# Patient Record
Sex: Female | Born: 1935 | Race: White | Hispanic: No | Smoking: Never smoker
Health system: Southern US, Community
[De-identification: ages and names within clinical notes are randomized; demographics above are authoritative.]

## PROBLEM LIST (undated history)

## (undated) DIAGNOSIS — E119 Type 2 diabetes mellitus without complications: Secondary | ICD-10-CM

## (undated) DIAGNOSIS — I2510 Atherosclerotic heart disease of native coronary artery without angina pectoris: Secondary | ICD-10-CM

## (undated) DIAGNOSIS — Z8781 Personal history of (healed) traumatic fracture: Secondary | ICD-10-CM

## (undated) DIAGNOSIS — N3010 Interstitial cystitis (chronic) without hematuria: Secondary | ICD-10-CM

## (undated) DIAGNOSIS — J449 Chronic obstructive pulmonary disease, unspecified: Secondary | ICD-10-CM

## (undated) DIAGNOSIS — M353 Polymyalgia rheumatica: Secondary | ICD-10-CM

## (undated) DIAGNOSIS — E785 Hyperlipidemia, unspecified: Secondary | ICD-10-CM

## (undated) DIAGNOSIS — R413 Other amnesia: Secondary | ICD-10-CM

## (undated) DIAGNOSIS — R5383 Other fatigue: Secondary | ICD-10-CM

## (undated) DIAGNOSIS — I10 Essential (primary) hypertension: Secondary | ICD-10-CM

## (undated) DIAGNOSIS — M1990 Unspecified osteoarthritis, unspecified site: Secondary | ICD-10-CM

## (undated) DIAGNOSIS — N390 Urinary tract infection, site not specified: Secondary | ICD-10-CM

## (undated) DIAGNOSIS — K8020 Calculus of gallbladder without cholecystitis without obstruction: Secondary | ICD-10-CM

## (undated) DIAGNOSIS — R5381 Other malaise: Secondary | ICD-10-CM

## (undated) DIAGNOSIS — E039 Hypothyroidism, unspecified: Secondary | ICD-10-CM

## (undated) HISTORY — PX: TOTAL ABDOMINAL HYSTERECTOMY: SHX209

## (undated) HISTORY — PX: TONSILLECTOMY: SUR1361

## (undated) HISTORY — DX: Hyperlipidemia, unspecified: E78.5

## (undated) HISTORY — PX: HERNIA REPAIR: SHX51

## (undated) HISTORY — DX: Chronic obstructive pulmonary disease, unspecified: J44.9

## (undated) HISTORY — DX: Other amnesia: R41.3

## (undated) HISTORY — PX: CERVICAL SPINE SURGERY: SHX589

## (undated) HISTORY — DX: Unspecified osteoarthritis, unspecified site: M19.90

## (undated) HISTORY — DX: Other fatigue: R53.83

## (undated) HISTORY — DX: Atherosclerotic heart disease of native coronary artery without angina pectoris: I25.10

## (undated) HISTORY — DX: Interstitial cystitis (chronic) without hematuria: N30.10

## (undated) HISTORY — DX: Calculus of gallbladder without cholecystitis without obstruction: K80.20

## (undated) HISTORY — PX: LUMBAR DISC SURGERY: SHX700

## (undated) HISTORY — PX: ROTATOR CUFF REPAIR: SHX139

## (undated) HISTORY — DX: Urinary tract infection, site not specified: N39.0

## (undated) HISTORY — DX: Type 2 diabetes mellitus without complications: E11.9

## (undated) HISTORY — DX: Polymyalgia rheumatica: M35.3

## (undated) HISTORY — DX: Other malaise: R53.81

## (undated) HISTORY — DX: Essential (primary) hypertension: I10

## (undated) HISTORY — DX: Personal history of (healed) traumatic fracture: Z87.81

## (undated) HISTORY — PX: CHOLECYSTECTOMY: SHX55

---

## 1998-11-06 ENCOUNTER — Other Ambulatory Visit: Admission: RE | Admit: 1998-11-06 | Discharge: 1998-11-06 | Payer: Self-pay | Admitting: *Deleted

## 1999-02-26 ENCOUNTER — Ambulatory Visit (HOSPITAL_COMMUNITY): Admission: RE | Admit: 1999-02-26 | Discharge: 1999-02-26 | Payer: Self-pay

## 1999-05-07 ENCOUNTER — Encounter: Payer: Self-pay | Admitting: Cardiology

## 1999-05-07 ENCOUNTER — Ambulatory Visit (HOSPITAL_COMMUNITY): Admission: RE | Admit: 1999-05-07 | Discharge: 1999-05-07 | Payer: Self-pay | Admitting: Cardiology

## 2000-02-03 ENCOUNTER — Other Ambulatory Visit: Admission: RE | Admit: 2000-02-03 | Discharge: 2000-02-03 | Payer: Self-pay | Admitting: *Deleted

## 2000-02-11 ENCOUNTER — Encounter: Admission: RE | Admit: 2000-02-11 | Discharge: 2000-02-11 | Payer: Self-pay | Admitting: Orthopedic Surgery

## 2000-02-11 ENCOUNTER — Encounter: Payer: Self-pay | Admitting: Orthopedic Surgery

## 2000-02-20 ENCOUNTER — Inpatient Hospital Stay (HOSPITAL_COMMUNITY): Admission: RE | Admit: 2000-02-20 | Discharge: 2000-02-21 | Payer: Self-pay | Admitting: Orthopedic Surgery

## 2000-07-06 ENCOUNTER — Ambulatory Visit (HOSPITAL_COMMUNITY): Admission: RE | Admit: 2000-07-06 | Discharge: 2000-07-06 | Payer: Self-pay | Admitting: Cardiology

## 2000-07-06 ENCOUNTER — Encounter: Payer: Self-pay | Admitting: Cardiology

## 2000-09-14 ENCOUNTER — Ambulatory Visit (HOSPITAL_BASED_OUTPATIENT_CLINIC_OR_DEPARTMENT_OTHER): Admission: RE | Admit: 2000-09-14 | Discharge: 2000-09-14 | Payer: Self-pay | Admitting: *Deleted

## 2000-09-14 ENCOUNTER — Encounter (INDEPENDENT_AMBULATORY_CARE_PROVIDER_SITE_OTHER): Payer: Self-pay | Admitting: Specialist

## 2001-03-02 ENCOUNTER — Other Ambulatory Visit: Admission: RE | Admit: 2001-03-02 | Discharge: 2001-03-02 | Payer: Self-pay | Admitting: *Deleted

## 2001-03-15 ENCOUNTER — Ambulatory Visit (HOSPITAL_COMMUNITY): Admission: RE | Admit: 2001-03-15 | Discharge: 2001-03-15 | Payer: Self-pay | Admitting: *Deleted

## 2001-03-15 HISTORY — PX: CARDIAC CATHETERIZATION: SHX172

## 2001-04-07 ENCOUNTER — Ambulatory Visit (HOSPITAL_COMMUNITY): Admission: RE | Admit: 2001-04-07 | Discharge: 2001-04-07 | Payer: Self-pay | Admitting: *Deleted

## 2001-11-18 ENCOUNTER — Ambulatory Visit (HOSPITAL_COMMUNITY): Admission: RE | Admit: 2001-11-18 | Discharge: 2001-11-18 | Payer: Self-pay | Admitting: Internal Medicine

## 2002-03-01 ENCOUNTER — Encounter: Admission: RE | Admit: 2002-03-01 | Discharge: 2002-03-01 | Payer: Self-pay | Admitting: Orthopedic Surgery

## 2002-03-01 ENCOUNTER — Encounter: Payer: Self-pay | Admitting: Orthopedic Surgery

## 2002-09-12 ENCOUNTER — Ambulatory Visit (HOSPITAL_COMMUNITY): Admission: RE | Admit: 2002-09-12 | Discharge: 2002-09-12 | Payer: Self-pay | Admitting: Vascular Surgery

## 2002-09-12 ENCOUNTER — Encounter (INDEPENDENT_AMBULATORY_CARE_PROVIDER_SITE_OTHER): Payer: Self-pay | Admitting: *Deleted

## 2002-09-16 ENCOUNTER — Encounter: Admission: RE | Admit: 2002-09-16 | Discharge: 2002-09-16 | Payer: Self-pay | Admitting: Rheumatology

## 2002-09-16 ENCOUNTER — Encounter: Payer: Self-pay | Admitting: Rheumatology

## 2002-10-04 ENCOUNTER — Encounter: Payer: Self-pay | Admitting: Rheumatology

## 2002-10-04 ENCOUNTER — Encounter: Admission: RE | Admit: 2002-10-04 | Discharge: 2002-10-04 | Payer: Self-pay | Admitting: Rheumatology

## 2005-02-28 ENCOUNTER — Ambulatory Visit: Payer: Self-pay | Admitting: Internal Medicine

## 2005-07-01 ENCOUNTER — Ambulatory Visit: Payer: Self-pay | Admitting: Internal Medicine

## 2005-09-24 ENCOUNTER — Ambulatory Visit: Payer: Self-pay | Admitting: Internal Medicine

## 2006-01-01 ENCOUNTER — Ambulatory Visit: Payer: Self-pay | Admitting: Internal Medicine

## 2006-01-29 ENCOUNTER — Ambulatory Visit (HOSPITAL_COMMUNITY): Admission: EM | Admit: 2006-01-29 | Discharge: 2006-01-29 | Payer: Self-pay | Admitting: Emergency Medicine

## 2006-01-29 HISTORY — PX: CARDIAC CATHETERIZATION: SHX172

## 2006-06-30 ENCOUNTER — Ambulatory Visit: Payer: Self-pay | Admitting: Internal Medicine

## 2007-01-01 ENCOUNTER — Ambulatory Visit: Payer: Self-pay | Admitting: Internal Medicine

## 2007-07-02 ENCOUNTER — Ambulatory Visit: Payer: Self-pay | Admitting: Internal Medicine

## 2007-09-14 HISTORY — PX: US ECHOCARDIOGRAPHY: HXRAD669

## 2007-10-25 ENCOUNTER — Encounter: Admission: RE | Admit: 2007-10-25 | Discharge: 2007-11-10 | Payer: Self-pay | Admitting: Otolaryngology

## 2007-11-08 ENCOUNTER — Emergency Department (HOSPITAL_COMMUNITY): Admission: EM | Admit: 2007-11-08 | Discharge: 2007-11-08 | Payer: Self-pay | Admitting: Emergency Medicine

## 2007-11-25 ENCOUNTER — Encounter: Admission: RE | Admit: 2007-11-25 | Discharge: 2007-11-26 | Payer: Self-pay | Admitting: Otolaryngology

## 2007-12-15 ENCOUNTER — Ambulatory Visit: Payer: Self-pay | Admitting: Internal Medicine

## 2007-12-17 DIAGNOSIS — R0602 Shortness of breath: Secondary | ICD-10-CM | POA: Insufficient documentation

## 2008-01-06 DIAGNOSIS — J449 Chronic obstructive pulmonary disease, unspecified: Secondary | ICD-10-CM

## 2008-01-06 DIAGNOSIS — E119 Type 2 diabetes mellitus without complications: Secondary | ICD-10-CM | POA: Insufficient documentation

## 2008-01-06 DIAGNOSIS — M353 Polymyalgia rheumatica: Secondary | ICD-10-CM

## 2008-01-06 DIAGNOSIS — J4489 Other specified chronic obstructive pulmonary disease: Secondary | ICD-10-CM | POA: Insufficient documentation

## 2008-01-07 ENCOUNTER — Ambulatory Visit: Payer: Self-pay | Admitting: Internal Medicine

## 2008-01-08 DIAGNOSIS — E785 Hyperlipidemia, unspecified: Secondary | ICD-10-CM | POA: Insufficient documentation

## 2008-01-08 DIAGNOSIS — J45909 Unspecified asthma, uncomplicated: Secondary | ICD-10-CM | POA: Insufficient documentation

## 2008-07-06 ENCOUNTER — Ambulatory Visit: Payer: Self-pay | Admitting: Internal Medicine

## 2008-08-28 ENCOUNTER — Telehealth (INDEPENDENT_AMBULATORY_CARE_PROVIDER_SITE_OTHER): Payer: Self-pay | Admitting: *Deleted

## 2008-10-31 ENCOUNTER — Encounter: Admission: RE | Admit: 2008-10-31 | Discharge: 2008-10-31 | Payer: Self-pay | Admitting: General Surgery

## 2008-12-26 ENCOUNTER — Encounter: Admission: RE | Admit: 2008-12-26 | Discharge: 2008-12-26 | Payer: Self-pay | Admitting: Rheumatology

## 2008-12-27 ENCOUNTER — Other Ambulatory Visit: Payer: Self-pay | Admitting: Emergency Medicine

## 2008-12-28 ENCOUNTER — Inpatient Hospital Stay (HOSPITAL_COMMUNITY): Admission: EM | Admit: 2008-12-28 | Discharge: 2008-12-30 | Payer: Self-pay | Admitting: Neurosurgery

## 2009-02-18 ENCOUNTER — Encounter: Admission: RE | Admit: 2009-02-18 | Discharge: 2009-02-18 | Payer: Self-pay | Admitting: Neurosurgery

## 2009-02-21 ENCOUNTER — Ambulatory Visit: Payer: Self-pay | Admitting: Internal Medicine

## 2009-08-17 HISTORY — PX: NM MYOCAR PERF WALL MOTION: HXRAD629

## 2009-08-22 ENCOUNTER — Encounter: Payer: Self-pay | Admitting: Orthopedic Surgery

## 2009-08-23 ENCOUNTER — Encounter: Admission: RE | Admit: 2009-08-23 | Discharge: 2009-08-23 | Payer: Self-pay | Admitting: Orthopedic Surgery

## 2009-08-28 ENCOUNTER — Ambulatory Visit: Payer: Self-pay | Admitting: Vascular Surgery

## 2009-08-28 ENCOUNTER — Encounter (INDEPENDENT_AMBULATORY_CARE_PROVIDER_SITE_OTHER): Payer: Self-pay | Admitting: Neurosurgery

## 2009-08-29 ENCOUNTER — Inpatient Hospital Stay (HOSPITAL_COMMUNITY): Admission: RE | Admit: 2009-08-29 | Discharge: 2009-08-30 | Payer: Self-pay | Admitting: Neurosurgery

## 2009-09-27 ENCOUNTER — Encounter: Admission: RE | Admit: 2009-09-27 | Discharge: 2009-09-27 | Payer: Self-pay | Admitting: Neurosurgery

## 2009-10-24 ENCOUNTER — Emergency Department (HOSPITAL_COMMUNITY): Admission: EM | Admit: 2009-10-24 | Discharge: 2009-10-24 | Payer: Self-pay | Admitting: Emergency Medicine

## 2009-11-01 ENCOUNTER — Encounter: Admission: RE | Admit: 2009-11-01 | Discharge: 2009-11-01 | Payer: Self-pay | Admitting: General Surgery

## 2009-11-16 ENCOUNTER — Encounter (HOSPITAL_BASED_OUTPATIENT_CLINIC_OR_DEPARTMENT_OTHER): Admission: RE | Admit: 2009-11-16 | Discharge: 2010-02-14 | Payer: Self-pay | Admitting: Internal Medicine

## 2009-11-23 ENCOUNTER — Encounter: Admission: RE | Admit: 2009-11-23 | Discharge: 2009-11-23 | Payer: Self-pay | Admitting: Neurosurgery

## 2009-12-21 ENCOUNTER — Encounter (HOSPITAL_BASED_OUTPATIENT_CLINIC_OR_DEPARTMENT_OTHER): Payer: Self-pay | Admitting: General Surgery

## 2009-12-21 ENCOUNTER — Ambulatory Visit: Payer: Self-pay | Admitting: Vascular Surgery

## 2009-12-21 ENCOUNTER — Ambulatory Visit: Admission: RE | Admit: 2009-12-21 | Discharge: 2009-12-21 | Payer: Self-pay | Admitting: General Surgery

## 2010-01-09 ENCOUNTER — Encounter: Admission: RE | Admit: 2010-01-09 | Discharge: 2010-01-09 | Payer: Self-pay | Admitting: Neurosurgery

## 2010-02-21 ENCOUNTER — Ambulatory Visit: Payer: Self-pay | Admitting: Internal Medicine

## 2010-03-12 ENCOUNTER — Encounter: Admission: RE | Admit: 2010-03-12 | Discharge: 2010-03-12 | Payer: Self-pay | Admitting: Neurosurgery

## 2010-03-25 ENCOUNTER — Inpatient Hospital Stay (HOSPITAL_COMMUNITY): Admission: RE | Admit: 2010-03-25 | Discharge: 2010-03-27 | Payer: Self-pay | Admitting: Orthopedic Surgery

## 2010-05-15 ENCOUNTER — Encounter: Payer: Self-pay | Admitting: Internal Medicine

## 2010-08-13 ENCOUNTER — Encounter: Payer: Self-pay | Admitting: Internal Medicine

## 2010-11-06 ENCOUNTER — Encounter
Admission: RE | Admit: 2010-11-06 | Discharge: 2010-11-06 | Payer: Self-pay | Source: Home / Self Care | Attending: General Surgery | Admitting: General Surgery

## 2010-11-12 ENCOUNTER — Encounter: Payer: Self-pay | Admitting: Internal Medicine

## 2010-12-12 NOTE — Letter (Signed)
Summary: [facility]   Imported By: Sherian Rein 08/21/2010 09:55:15  _____________________________________________________________________  External Attachment:    Type:   Image     Comment:   External Document

## 2010-12-12 NOTE — Letter (Signed)
Summary: [facility]   Imported By: Lester [place] 05/30/2010 09:50:31  _____________________________________________________________________  External Attachment:    Type:   Image     Comment:   External Document

## 2010-12-12 NOTE — Letter (Signed)
Summary: [facility]   Imported By: Lennie Odor 12/05/2010 09:16:08  _____________________________________________________________________  External Attachment:    Type:   Image     Comment:   External Document

## 2010-12-12 NOTE — Assessment & Plan Note (Signed)
Summary: 1 YEAR RETURN/[facility]   Primary Provider/Referring Provider:  Loma Sender  CC:  Yearly follow up visit-Still having SOB with activity..  History of Present Illness:  07/03/08 77 year old woman returning for follow-up of asthma/ copd. She remains on prednisone 7.5 mg daily, which ikely willhelp her breathing. She denies wheeze, phlegm, chest pain , discolored sputum, fever, ankle edema. Using Advair 100/50 once daily duet o finances. Last PFT was in February.  02/21/09- COPD, asthma Not outside much, limited by backpain after lumbar surgery this winter. Dr Stoney Bang through winter ok but starting yesterday evening got rhinorhea, minimal sore throat, eyes water, sneeze. Not in chest and no obvious fever or purulent. She thinks it is a cold, not allergy but has had no sick exposure.  February 21, 2010- COPD, asthma Activity limited by arthritis in knees- going for surgery Dr Charlann Boxer next month. Had one remote pneumonia vaccine. She has long second-hand smoke hx and heats with wood. Chronic pain behind right breast if she forces deep  breath- not new or progressive. No longer keeps much cough or phlegm. Needs refill Advair. ENT recently gave azithromycin for vertigo. Saw Dr Lynnea Ferrier this winter for tachycardia. PFT 2009- Mild obstruction small airways. FEV1 1.55 L/ 88%, little response to BD.  Current Medications (verified): 1)  Micardis Hct 40-12.5 Mg  Tabs (Telmisartan-Hctz) .... Take 1 Tablet By Mouth Once A Day 2)  Synthroid 88 Mcg Tabs (Levothyroxine Sodium) .... Take 1 By Mouth Once Daily 3)  Advair Diskus 100-50 Mcg/dose  Misc (Fluticasone-Salmeterol) .... Use One Puff Two Times A Day Rinse Mouth After Each Use 4)  Multivitamins   Tabs (Multiple Vitamin) .... Take 1 Tablet By Mouth Once A Day 5)  Prednisone 5 Mg  Tabs (Prednisone) .... Take 1 Tablet By Mouth Once A Day 6)  Hydrocodone-Acetaminophen 5-500 Mg Tabs (Hydrocodone-Acetaminophen) .... Take 1 By Mouth Every 6 Hours As  Needed 7)  Humulin N 100 Unit/ml Susp (Insulin Isophane Human) .... As Directed 8)  Humalog 100 Unit/ml Soln (Insulin Lispro (Human)) .... As Directed  Allergies (verified): No Known Drug Allergies  Past History:  Past Medical History: Last updated: 01/07/2008 Diabetes, Type 2 Hyperlipidemia Asthma/ Mild COPD Polymyalgia rheumatica- Dr. Phylliss Bob  Family History: Last updated: 02/21/2010 Mother doed age 21- breast cancer Father died 34- heart disease brother- oral cancer nonsmoker brother- died lung cancer  Social History: Last updated: 02/21/2010 Patient never smoked. - was married to smoker widowed  Risk Factors: Smoking Status: never (01/07/2008)  Past Surgical History: lumbar disk surgery Right rotator cuff Cervical spine- Oct, 2010 Total Abdominal Hysterectomy Breast lumps- benign Tonsillectomy  Family History: Mother doed age 33- breast cancer Father died 70- heart disease brother- oral cancer nonsmoker brother- died lung cancer  Social History: Patient never smoked. - was married to smoker widowed  Review of Systems      See HPI  The patient denies anorexia, fever, weight loss, weight gain, vision loss, decreased hearing, hoarseness, chest pain, syncope, dyspnea on exertion, peripheral edema, prolonged cough, headaches, hemoptysis, and severe indigestion/heartburn.    Vital Signs:  Patient profile:   77 year old female Height:      62 inches Weight:      150.25 pounds BMI:     27.58 O2 Sat:      100 % on Room air Pulse rate:   73 / minute BP sitting:   130 / 78  (left arm) Cuff size:   regular  Vitals Entered By: Reynaldo Minium  CMA (February 21, 2010 1:57 PM)  O2 Flow:  Room air  Physical Exam  Additional Exam:  General: A/Ox3; pleasant and cooperative, NAD, SKIN: no rash, lesions NODES: no lymphadenopathy HEENT: [place]/AT, EOM- WNL, Conjuctivae- clear, PERRLA, TM-WNL, Nose-clear, Throat- clear and wnl NECK: Supple w/ fair ROM, JVD- none, normal  carotid impulses w/o bruits Thyroid- normal to palpation CHEST: Clear to P&A, no cough or wheeze HEART: RRR, no m/g/r heard ABDOMEN:  WUX:LKGM, nl pulses, no edema, cyanosis or clubbing  NEURO: Grossly intact to observation      Impression & Recommendations:  Problem # 1:  COPD (ICD-496) Mild COPD with 2009 PFT FEV1  1.55L/ 88%, FEV1/FVC 0.70, little response to BD. Much of this was from past second hand smoke. She is at baseline and maintained comfortably with Advair. She is at stable baseline now and should tolerate surgery/ GOT. We can see for pulmonary assistance if needed at time of surgery. We will update pneumovax and refill Advair.   Medications Added to Medication List This Visit: 1)  Synthroid 88 Mcg Tabs (Levothyroxine sodium) .... Take 1 by mouth once daily 2)  Hydrocodone-acetaminophen 5-500 Mg Tabs (Hydrocodone-acetaminophen) .... Take 1 by mouth every 6 hours as needed 3)  Humulin N 100 Unit/ml Susp (Insulin isophane human) .... As directed 4)  Humalog 100 Unit/ml Soln (Insulin lispro (human)) .... As directed  Other Orders: Est. Patient Level III (01027) Prescription Created Electronically [phone])  Patient Instructions: 1)  Please schedule a follow-up appointment in 1 year. 2)  Pneumonia vaccine booster 3)  Refill script sent to your drug store. Prescriptions: ADVAIR DISKUS 100-50 MCG/DOSE  MISC (FLUTICASONE-SALMETEROL) use one puff two times a day rinse mouth after each use  #1 x prn   Entered and Authorized by:   Waymon Budge MD   Signed by:   Waymon Budge MD on 02/21/2010   Method used:   Electronically to        [facility] (retail)       [address]       [place], [place]  [id number]       Ph: [phone]       Fax: [phone]   RxID:   [id number]     Appended Document: pneumovax documentation    Clinical Lists Changes  Orders: Added new Service order of Pneumococcal Vaccine (73220) - Signed Added new  Service order of Admin 1st Vaccine (25427) - Signed Observations: Added new observation of PNEUMOVAXVIS: 06/07/96 version given February 21, 2010. (02/21/2010 16:59) Added new observation of PNEUMOVAXLOT: 0130aa (02/21/2010 16:59) Added new observation of PNEUMOVAXEXP: 06/22/2011 (02/21/2010 16:59) Added new observation of PNEUMOVAXBY: Boone Master CNA (02/21/2010 16:59) Added new observation of PNEUMOVAXRTE: IM (02/21/2010 16:59) Added new observation of PNEUMOVAXDOS: 0.5 ml (02/21/2010 16:59) Added new observation of PNEUMOVAXMFR: Merck (02/21/2010 16:59) Added new observation of PNEUMOVAXSIT: left deltoid (02/21/2010 16:59) Added new observation of PNEUMOVAX: Pneumovax (02/21/2010 16:59)       Immunizations Administered:  Pneumonia Vaccine:    Vaccine Type: Pneumovax    Site: left deltoid    Mfr: Merck    Dose: 0.5 ml    Route: IM    Given by: Boone Master CNA    Exp. Date: 06/22/2011    Lot #: [id number]    VIS given: 06/07/96 version given February 21, 2010.

## 2011-01-27 LAB — COMPREHENSIVE METABOLIC PANEL
ALT: 23 U/L (ref 0–35)
AST: 43 U/L — ABNORMAL HIGH (ref 0–37)
Alkaline Phosphatase: 77 U/L (ref 39–117)
Creatinine, Ser: 1.14 mg/dL (ref 0.4–1.2)
GFR calc Af Amer: 56 mL/min — ABNORMAL LOW (ref >60)
GFR calc non Af Amer: 46 mL/min — ABNORMAL LOW (ref >60)
Potassium: 4.6 mEq/L (ref 3.5–5.1)
Sodium: 142 mEq/L (ref 135–145)

## 2011-01-27 LAB — CBC
HCT: 26.6 % — ABNORMAL LOW (ref 36.0–46.0)
Hemoglobin: 9 g/dL — ABNORMAL LOW (ref 12.0–15.0)
MCV: 93.7 fL (ref 78.0–100.0)
MCV: 94.7 fL (ref 78.0–100.0)
Platelets: 180 10*3/uL (ref 150–400)
Platelets: 205 10*3/uL (ref 150–400)
RBC: 2.91 MIL/uL — ABNORMAL LOW (ref 3.87–5.11)
RDW: 14.5 % (ref 11.5–15.5)
WBC: 8 10*3/uL (ref 4.0–10.5)
WBC: 8 10*3/uL (ref 4.0–10.5)

## 2011-01-27 LAB — GLUCOSE, CAPILLARY
Glucose-Capillary: 104 mg/dL — ABNORMAL HIGH (ref 70–99)
Glucose-Capillary: 116 mg/dL — ABNORMAL HIGH (ref 70–99)
Glucose-Capillary: 136 mg/dL — ABNORMAL HIGH (ref 70–99)
Glucose-Capillary: 164 mg/dL — ABNORMAL HIGH (ref 70–99)
Glucose-Capillary: 228 mg/dL — ABNORMAL HIGH (ref 70–99)
Glucose-Capillary: 312 mg/dL — ABNORMAL HIGH (ref 70–99)
Glucose-Capillary: 60 mg/dL — ABNORMAL LOW (ref 70–99)
Glucose-Capillary: 69 mg/dL — ABNORMAL LOW (ref 70–99)
Glucose-Capillary: 74 mg/dL (ref 70–99)

## 2011-01-27 LAB — BASIC METABOLIC PANEL
BUN: 23 mg/dL (ref 6–23)
Chloride: 104 mEq/L (ref 96–112)
Chloride: 109 mEq/L (ref 96–112)
Creatinine, Ser: 1 mg/dL (ref 0.4–1.2)
GFR calc Af Amer: >60 mL/min (ref >60)
GFR calc non Af Amer: 54 mL/min — ABNORMAL LOW (ref >60)
Glucose, Bld: 315 mg/dL — ABNORMAL HIGH (ref 70–99)
Potassium: 5.5 mEq/L — ABNORMAL HIGH (ref 3.5–5.1)
Sodium: 135 mEq/L (ref 135–145)

## 2011-01-27 LAB — TYPE AND SCREEN
ABO/RH(D): O POS
Antibody Screen: NEGATIVE

## 2011-01-28 LAB — CBC
HCT: 36.3 % (ref 36.0–46.0)
Hemoglobin: 12.2 g/dL (ref 12.0–15.0)
Platelets: 270 10*3/uL (ref 150–400)
RDW: 13.9 % (ref 11.5–15.5)
WBC: 6.4 10*3/uL (ref 4.0–10.5)

## 2011-01-28 LAB — BASIC METABOLIC PANEL
Calcium: 9.7 mg/dL (ref 8.4–10.5)
GFR calc non Af Amer: 55 mL/min — ABNORMAL LOW (ref >60)
Glucose, Bld: 54 mg/dL — ABNORMAL LOW (ref 70–99)
Potassium: 4.1 mEq/L (ref 3.5–5.1)
Sodium: 141 mEq/L (ref 135–145)

## 2011-01-28 LAB — URINE MICROSCOPIC-ADD ON

## 2011-01-28 LAB — URINALYSIS, ROUTINE W REFLEX MICROSCOPIC
Bilirubin Urine: NEGATIVE
Glucose, UA: 100 mg/dL — AB
Hgb urine dipstick: NEGATIVE
Ketones, ur: NEGATIVE mg/dL
Specific Gravity, Urine: 1.019 (ref 1.005–1.030)
pH: 5.5 (ref 5.0–8.0)

## 2011-01-28 LAB — DIFFERENTIAL
Basophils Absolute: 0 10*3/uL (ref 0.0–0.1)
Eosinophils Relative: 0 % (ref 0–5)
Lymphocytes Relative: 34 % (ref 12–46)
Lymphs Abs: 2.2 10*3/uL (ref 0.7–4.0)
Monocytes Absolute: 0.6 10*3/uL (ref 0.1–1.0)
Neutro Abs: 3.7 10*3/uL (ref 1.7–7.7)

## 2011-01-28 LAB — APTT: aPTT: 27 seconds (ref 24–37)

## 2011-01-28 LAB — PROTIME-INR: Prothrombin Time: 13.6 seconds (ref 11.6–15.2)

## 2011-02-13 LAB — BASIC METABOLIC PANEL
BUN: 18 mg/dL (ref 6–23)
Calcium: 9.3 mg/dL (ref 8.4–10.5)
Chloride: 101 mEq/L (ref 96–112)
Creatinine, Ser: 0.9 mg/dL (ref 0.4–1.2)
GFR calc Af Amer: >60 mL/min (ref >60)
GFR calc Af Amer: >60 mL/min (ref >60)
GFR calc non Af Amer: 54 mL/min — ABNORMAL LOW (ref >60)
Potassium: 3.9 mEq/L (ref 3.5–5.1)
Sodium: 140 mEq/L (ref 135–145)

## 2011-02-13 LAB — GLUCOSE, CAPILLARY
Glucose-Capillary: 123 mg/dL — ABNORMAL HIGH (ref 70–99)
Glucose-Capillary: 303 mg/dL — ABNORMAL HIGH (ref 70–99)
Glucose-Capillary: 548 mg/dL (ref 70–99)

## 2011-02-13 LAB — CBC
HCT: 35.6 % — ABNORMAL LOW (ref 36.0–46.0)
Hemoglobin: 12.7 g/dL (ref 12.0–15.0)
MCV: 91 fL (ref 78.0–100.0)
Platelets: 232 10*3/uL (ref 150–400)
RBC: 3.94 MIL/uL (ref 3.87–5.11)
RBC: 3.91 MIL/uL (ref 3.87–5.11)
WBC: 6.6 10*3/uL (ref 4.0–10.5)
WBC: 5.9 10*3/uL (ref 4.0–10.5)

## 2011-02-13 LAB — PROTIME-INR
INR: 1.02 (ref 0.00–1.49)
Prothrombin Time: 13.3 seconds (ref 11.6–15.2)

## 2011-02-13 LAB — DIFFERENTIAL
Eosinophils Relative: 0 % (ref 0–5)
Lymphocytes Relative: 31 % (ref 12–46)
Lymphs Abs: 1.8 10*3/uL (ref 0.7–4.0)
Monocytes Relative: 9 % (ref 3–12)
Neutrophils Relative %: 60 % (ref 43–77)

## 2011-02-13 LAB — URINALYSIS, ROUTINE W REFLEX MICROSCOPIC
Protein, ur: NEGATIVE mg/dL
Specific Gravity, Urine: 1.027 (ref 1.005–1.030)
Urobilinogen, UA: 0.2 mg/dL (ref 0.0–1.0)

## 2011-02-13 LAB — URINE MICROSCOPIC-ADD ON

## 2011-02-18 ENCOUNTER — Encounter: Payer: Self-pay | Admitting: Internal Medicine

## 2011-02-20 ENCOUNTER — Encounter: Payer: Self-pay | Admitting: Internal Medicine

## 2011-02-20 ENCOUNTER — Ambulatory Visit (INDEPENDENT_AMBULATORY_CARE_PROVIDER_SITE_OTHER): Payer: Medicare Other | Admitting: Internal Medicine

## 2011-02-20 VITALS — BP 144/70 | HR 74 | Ht 62.0 in | Wt 171.0 lb

## 2011-02-20 DIAGNOSIS — J449 Chronic obstructive pulmonary disease, unspecified: Secondary | ICD-10-CM

## 2011-02-20 MED ORDER — TIOTROPIUM BROMIDE MONOHYDRATE 18 MCG IN CAPS: 18.0000 ug | ORAL_CAPSULE | Freq: Every day | RESPIRATORY_TRACT | Status: DC

## 2011-02-20 MED ORDER — ALBUTEROL SULFATE HFA 108 (90 BASE) MCG/ACT IN AERS: 2.0000 | INHALATION_SPRAY | Freq: Four times a day (QID) | RESPIRATORY_TRACT | Status: DC | PRN

## 2011-02-20 NOTE — Patient Instructions (Signed)
Sample Spiriva- one daily.   See [facility] about financial assistance for Spiriva before filling the scrip.  You can continue Advair if it helps.  Script for rescue inhaler Proair to use as a "quick patch"-   2 puffs up to 4 times daily if needed.

## 2011-02-20 NOTE — Progress Notes (Signed)
  Subjective:    Patient ID: Michelle [name], female    DOB: 15-Nov-1931, 77 y.o.   MRN: [id number]  HPI 89 yoF never smoker, followed for COPD/ asthma, complicated by Polymyalgia Rheumatica. Last here February 21, 2010. Husband was a long time smoker- so she had significant second hand exposure and has continued also to heat with wood . She ran out of Advair using once daily, when she reached donut hole status last November. Didn't use it at all November and December. Now back at once daily. Notices some modest benefit, but still can't afford the Advair and asks alternatives. Has not had a rescue inhaler. Wheeze most days, sometimes worse than others. Cough scant sputum most mornings.  Blows nose. Denies chest pain. Has had occasional palpitations. Sees Dr Lynnea Ferrier for cardiology. Tolerated right knee replacement last year.    Review of Systems Constitutional:   No weight loss, night sweats,  Fevers, chills, fatigue, lassitude. HEENT:   No headaches,  Difficulty swallowing,  Tooth/dental problems,  Sore throat,                No sneezing, itching, ear ache, nasal congestion, post nasal drip,   CV:  No chest pain,  Orthopnea, PND, swelling in lower extremities, anasarca, dizziness, palpitations  GI  No heartburn, indigestion, abdominal pain, nausea, vomiting, diarrhea, change in bowel habits, loss of appetite  Resp: ,  No coughing up of blood.  No change in color of mucus.  No wheezing.  No chest wall deformity  Skin: no rash or lesions.  GU: no dysuria, change in color of urine, no urgency or frequency.  No flank pain.  MS:  No joint pain or swelling.  No decreased range of motion.  No back pain.  Psych:  No change in mood or affect. No depression or anxiety.  No memory loss.     Objective:   Physical Exam General- Alert, Oriented, Affect-appropriate, Distress- none acute.    talkative  Skin- rash-none, lesions- none, excoriation- none  Lymphadenopathy- none  Head- atraumatic  Eyes-  Gross vision intact, PERRLA, conjunctivae clear secretions  Ears- Hearing normal canals, Tm L , R ,  Nose- Clear,  No-Septal dev, mucus, polyps, erosion, perforation   Throat- Mallampati II , mucosa clear , drainage- none, tonsils- atrophic  Neck- flexible , trachea midline, no stridor , thyroid nl, carotid no bruit  Chest - symmetrical excursion , unlabored     Heart/CV- RRR , no murmur , no gallop  , no rub, nl s1 s2                     - JVD- none , edema- none, stasis changes- none, varices- none     Lung- clear to P&A, wheeze- none, cough- none , dullness-none, rub- none     Chest wall- atraumatic, no scar  Abd- tender-no, distended-no, bowel sounds-present, HSM- no  Br/ Gen/ Rectal- Not done, not indicated  Extrem- cyanosis- none, clubbing, none, atrophy- none, strength- nl  Neuro- grossly intact to observation         Assessment & Plan:

## 2011-02-20 NOTE — Assessment & Plan Note (Addendum)
Not sure, and it may be moot, whether her obstructive airways disease was more from chronic fixed asthma vs result of her husband's smoking.  She can't afford Advair. She asks me about Spiriva, from TV ads. We discussed use of a rescue inhaler and Spiriva with financial assistance.

## 2011-02-24 ENCOUNTER — Encounter: Payer: Self-pay | Admitting: Internal Medicine

## 2011-02-25 LAB — BASIC METABOLIC PANEL
BUN: 27 mg/dL — ABNORMAL HIGH (ref 6–23)
CO2: 26 mEq/L (ref 19–32)
CO2: 27 mEq/L (ref 19–32)
Calcium: 8.6 mg/dL (ref 8.4–10.5)
Calcium: 8.7 mg/dL (ref 8.4–10.5)
Chloride: 102 mEq/L (ref 96–112)
Creatinine, Ser: 1.21 mg/dL — ABNORMAL HIGH (ref 0.4–1.2)
GFR calc Af Amer: >60 mL/min (ref >60)
GFR calc non Af Amer: 44 mL/min — ABNORMAL LOW (ref >60)
GFR calc non Af Amer: 51 mL/min — ABNORMAL LOW (ref >60)
Glucose, Bld: 174 mg/dL — ABNORMAL HIGH (ref 70–99)
Glucose, Bld: 209 mg/dL — ABNORMAL HIGH (ref 70–99)
Glucose, Bld: 261 mg/dL — ABNORMAL HIGH (ref 70–99)
Potassium: 5.1 mEq/L (ref 3.5–5.1)
Sodium: 137 mEq/L (ref 135–145)

## 2011-02-25 LAB — CBC
HCT: 34 % — ABNORMAL LOW (ref 36.0–46.0)
Hemoglobin: 11.1 g/dL — ABNORMAL LOW (ref 12.0–15.0)
MCHC: 34.2 g/dL (ref 30.0–36.0)
MCHC: 34.5 g/dL (ref 30.0–36.0)
MCV: 92.1 fL (ref 78.0–100.0)
Platelets: 183 10*3/uL (ref 150–400)
Platelets: 189 10*3/uL (ref 150–400)
RBC: 3.52 MIL/uL — ABNORMAL LOW (ref 3.87–5.11)
RDW: 13.1 % (ref 11.5–15.5)
RDW: 13.3 % (ref 11.5–15.5)
RDW: 13.3 % (ref 11.5–15.5)
WBC: 7.9 10*3/uL (ref 4.0–10.5)

## 2011-02-25 LAB — DIFFERENTIAL
Basophils Absolute: 0 10*3/uL (ref 0.0–0.1)
Basophils Relative: 0 % (ref 0–1)
Eosinophils Absolute: 0 10*3/uL (ref 0.0–0.7)
Eosinophils Relative: 0 % (ref 0–5)
Lymphs Abs: 0.3 10*3/uL — ABNORMAL LOW (ref 0.7–4.0)

## 2011-02-25 LAB — GLUCOSE, CAPILLARY
Glucose-Capillary: 108 mg/dL — ABNORMAL HIGH (ref 70–99)
Glucose-Capillary: 132 mg/dL — ABNORMAL HIGH (ref 70–99)
Glucose-Capillary: 217 mg/dL — ABNORMAL HIGH (ref 70–99)
Glucose-Capillary: 238 mg/dL — ABNORMAL HIGH (ref 70–99)
Glucose-Capillary: 256 mg/dL — ABNORMAL HIGH (ref 70–99)
Glucose-Capillary: 352 mg/dL — ABNORMAL HIGH (ref 70–99)
Glucose-Capillary: 372 mg/dL — ABNORMAL HIGH (ref 70–99)
Glucose-Capillary: 91 mg/dL (ref 70–99)
Glucose-Capillary: 118 mg/dL — ABNORMAL HIGH (ref 70–99)

## 2011-02-25 LAB — PROTIME-INR: Prothrombin Time: 14.2 seconds (ref 11.6–15.2)

## 2011-02-25 LAB — TYPE AND SCREEN: Antibody Screen: NEGATIVE

## 2011-03-24 ENCOUNTER — Telehealth: Payer: Self-pay | Admitting: Internal Medicine

## 2011-03-24 MED ORDER — IPRATROPIUM BROMIDE HFA 17 MCG/ACT IN AERS: 2.0000 | INHALATION_SPRAY | Freq: Four times a day (QID) | RESPIRATORY_TRACT | Status: DC

## 2011-03-24 NOTE — Telephone Encounter (Signed)
Spoke with pt.  She states that she was denied pt assistance for spiriva b/c they told her that she has not spent enough money on her meds so far this yr. She states that she still has a few doses left. Wants to know what to do. Pls advise thanks No Known Allergies

## 2011-03-24 NOTE — Telephone Encounter (Signed)
Per CY-suggest try Atrovent HFA #1 2 puffs qid with prn refills(chemically similar to Spiriva)

## 2011-03-24 NOTE — Telephone Encounter (Signed)
Spoke with pt and notified atrovent can be called in. She states that she is willing to try this, so rx was sent to pharm.

## 2011-03-25 NOTE — Assessment & Plan Note (Signed)
[facility]                             PULMONARY OFFICE NOTE   AMBRI, MILTNER                       MRN:          [id number]  DATE:07/02/2007                            DOB:          1932/04/01    PROBLEMS:  1. Chronic obstructive pulmonary disease.  2. Polymyalgia rheumatica.  3. Diabetes.   HISTORY:  Six month follow up. Complains of easy exertional dyspnea, no  energy. She sleeps okay at night. No sudden events. No chest pain,  palpitation, blood, or any particular change that she can point to that  would explain why she would be more short of breath when she walks.   MEDICATIONS:  1. Micardis.  2. Synthroid.  3. Advair 100/50.  4. Insulin.  5. Prednisone now 7.5 mg daily.  6. Crestor.  7. Fosamax.  8. Multivitamins.  9. Calcium.   ALLERGIES:  No medication allergy.   OBJECTIVE:  Weight 176 pounds, blood pressure 152/80, pulse 74, room air  saturation 100%. There is a trace murmur of aortic stenosis. Lung fields  sound clear. Frequent watery sniffing, but no visible drainage. No  strider. No adenopathy. No peripheral edema or cyanosis. She is obese.   IMPRESSION:  Exertional dyspnea, probably includes components of  deconditioning, as well as her known obstructive airways disease. Last  PFTs were two years ago. Last chest x-ray in February had been stable  with no active lung disease, and heart size within upper limits of  normal.   PLAN:  Recheck PFT is something I can do to assess her dyspnea  complaint; sample albuterol rescue inhaler for trial 2 puffs q.i.d.  p.r.n.; try sample Advair 250/50 for two weeks and then drop  back to her 100/50 when that is done. Watch for the effect of either of  these changes. Schedule return 6 months, earlier p.r.n.     Clinton D. Maple Hudson, MD, Tonny Bollman, FACP  Electronically Signed    CDY/MedQ  DD: 07/02/2007  DT: 07/04/2007  Job #: [id number]   cc:   Theressa Millard, M.D.  Darlin Priestly, MD  Courtney Paris, M.D.

## 2011-03-25 NOTE — Op Note (Signed)
[name], [name] NO.:  [id number]   MEDICAL RECORD NO.:  [id number]          PATIENT TYPE:  INP   LOCATION:  3003                         FACILITY:  [facility]   PHYSICIAN:  Donalee Citrin, M.D.        DATE OF BIRTH:  Aug 24, 1932   DATE OF PROCEDURE:  12/29/2008  DATE OF DISCHARGE:                               OPERATIVE REPORT   PREOPERATIVE DIAGNOSIS:  Right L3 radiculopathy, extraforaminal disk  herniation L3-4, right.   PROCEDURE:  Extraforaminal approach to the right L3-4 diskectomy with  microscopic dissection of the right L3 nerve root microscopic  diskectomy.   SURGEON:  Donalee Citrin, MD   ASSISTANT:  Reinaldo Meeker, MD   ANESTHESIA:  General endotracheal.   HISTORY OF PRESENT ILLNESS:  The patient is a very pleasant 77 year old  female who has had predominantly right hip and leg pain progressively  worse over the last several days and weeks.  The patient presented to  the emergency room 2 nights ago with inability to walk secondary to  pain.  The patient was evaluated and was noted to have some quadriceps  weakness and possible pain limitation.  MRI scan showed a very large  disk herniation at L3-4 with a free fragment in the extraforaminal space  pinning the L3 nerve against the pedicle.  The patient failed all forms  of conservative treatment with steroids as well as Depo-Medrol IM  injections.  With her conservative failure of pain control, the patient  was recommended laminectomy and microdiskectomy.  Risks and benefits of  the operation were explained to the patient.  She understood and agreed  to proceed forth.   The patient was brought to the OR and was induced under general  anesthesia, positioned prone on the Wilson frame.  Back was prepped in  the usual sterile fashion.  Preop x-ray localized the appropriate level  at L3-4.  Then the skin incision was made after infiltrating with 10 mL  of lidocaine with epinephrine and subperiosteal  dissection was carried  down to the lamina at L4 and L5 exposing the TP at L3, the lateral facet  complexes and lateral pars at L3-4.  Intraoperative x-ray confirmed  localization at the appropriate level and using high-speed drill, the  superior aspect of the L3-4 facet complex, lateral pars and inferior  aspect of the L3 pedicle was drilled.  L2-3 facet complex was drilled  down to gain access to the extraforaminal compartment at L3-4.  Then  using 4 Penfield, the undersurface of the lateral pars was developed  overlying the inner transverse ligament.  This was all bitten away with  a 2-mm Kerrison punch and inner transverse ligament was then dissected  off of the subcutaneous tissue down below and removed in piecemeal  fashion.  This exposed the undersurface of the L3 nerve root at the  level of L3 pedicle.  Then the overlying ligament was further removed  extending inferiorly to expose the disk space.  Then using a nerve hook  reaching underneath the L3 nerve root several very large free fragment  disks were immediately expressed and removed to self decompress the L3  root.  The disk space was then incised, cleaned out with pituitary.  Several additional fragments were removed from distally out the L3 root  until the L3 had resumed its normal configuration, it was no longer  flattened out against the top of the disk herniation and there was no  further fragments appreciated from the under the root extending all the  way out of the foramen.  It was pulled with a coronary dilator and  hockey stick and after no further fragments were appreciated, the wound  was copiously irrigated.  Good hemostasis was maintained.  Gelfoam was  overlaid on top of the extraforaminal compartment at this level and the  wound was closed sequentially in layers with interrupted Vicryl.   ADDENDUM  The microscope was brought into the field to further do the  identification of the L3 nerve root against the  pedicle and the entire  diskectomy was done under microscopic illumination.  Then the wound was  dressed and sent to recovery room in stable condition.  At the end of  the case, sponge and instrument counts were correct.           ______________________________  Donalee Citrin, M.D.     GC/MEDQ  D:  12/29/2008  T:  12/30/2008  Job:  [id number]

## 2011-03-25 NOTE — H&P (Signed)
NAMEZAKAIYA, LARES NO.:  [id number]   MEDICAL RECORD NO.:  [id number]          PATIENT TYPE:  EMS   LOCATION:  ED                           FACILITY:  [facility]   PHYSICIAN:  Donalee Citrin, M.D.        DATE OF BIRTH:  1932/05/24   DATE OF ADMISSION:  12/27/2008  DATE OF DISCHARGE:                              HISTORY & PHYSICAL   ADMITTING DIAGNOSIS:  Right acute L3 radiculopathy from herniated  nucleus pulposus on the right at L3-4.   HISTORY OF PRESENT ILLNESS:  The patient is a very pleasant 77 year old  female who has had past medical history of COPD, diabetes, and  hypothyroidism, who has had approximately 1-2 weeks of acute right-sided  low back pain that would radiate to her right groin, right hip down the  anterior margin of her right thigh, never goes below the knee on the  right side, never has any left leg pain.  Denies any bowel and bladder  complaints.  This pain has been treated progressively with Percocet,  oxycodone, OxyContin, and subsequently Demerol.  The patient has had a  cortisone injection in the right hip.  All of this has been refractory  till today, when she was not even able to get out of bed and ambulate,  take her medications because she could not get a sip of water.  The  patient contact Dr. Dareen Piano, who is a rheumatologist.  He has been  managing the pain as an outpatient, and they told her to go to the  emergency room.  She underwent an MRI scan yesterday, and the patient  presents now for evaluation.   The remainder of the patient's review of systems is as stated above with  the addition of with previous cortisone injections and her history of  getting steroids and being on chronic prednisone.  She does have  significant reactions with her sugars that they tend to go very high up  in the 300-400.  CBG was checked in the emergency department and was  118.   Past surgical history includes rotator cuff surgery.  She also has a  remote history of breast cancer.   Medications include glyburide, insulin sliding scale, as well as insulin  NPH 70 units every morning, Synthroid, Demerol, oxycodone, and  hydrocodone intermittently for pain.   PHYSICAL EXAMINATION:  GENERAL:  The patient is very pleasant 76-year-  old female in no acute distress.  HEENT:  Within normal limits.  EXTREMITIES:  Lower extremity strength is 5/5 in iliopsoas, quads,  hamstrings, gastroc, and EHL.  She does have some pain limitations to  the right quads.  I think this is full strength, although with her pain,  I am not able to tell for sure whether this is some 4 to 4+ weakness in  her right quadriceps versus pain-limited exam.  She does not appear to  have a straight leg raise.  She has good distal function with 5/5  dorsiflexion and plantar flexion.  Sensation is slightly decreased in an  L3 nerve root pattern on the right leg.  Her MRI scan shows multilevel degenerative disk disease and spondylosis;  however, there does appear to be an acute foraminal and extraforaminal  disk herniation on the right at L3-4.  This is clearly, I think, the  source of her clinical syndrome with a right acute L3 radiculopathy, and  the patient has been refractory to pain management at home, so I am  going to admit her, place on IV Decadron, watch her sugars very closely,  and plan to do a  laminectomy, extraforaminal diskectomy tomorrow evening.  We will need  to get her worked up initially in the morning by Anesthesia and probably  also Critical Care and Pulmonary for COPD, and provided we get medical  clearance, we can proceed forward with surgery on Friday morning.           ______________________________  Donalee Citrin, M.D.     GC/MEDQ  D:  12/27/2008  T:  12/28/2008  Job:  [id number]

## 2011-03-25 NOTE — Consult Note (Signed)
NAMEPATTE, WINKEL NO.:  [id number]   MEDICAL RECORD NO.:  [id number]          PATIENT TYPE:  EMS   LOCATION:  ED                           FACILITY:  [facility]   PHYSICIAN:  Sarajane Marek, MD     DATE OF BIRTH:  1932/08/20   DATE OF CONSULTATION:  DATE OF DISCHARGE:                                 CONSULTATION   CHIEF COMPLAINT:  Back pain and L3 compression.   HISTORY OF PRESENT ILLNESS:  Michelle [name] is a 77 year old female with past  medical history as detailed below presenting with uncontrolled pain  determined to be secondary to an L3 compression fracture.  She reported  the pain radiates down her right leg but has not had any muscle weakness  or loss of sensation.  Denies loss of bowel or bladder continence.  Regarding her preoperative evaluation she denies any chest pain,  shortness of breath, dyspnea on exertion, or lower extremity edema.  She  does not have orthopnea or PND.  She has not had recent fevers nor  wheezing or shortness of breath secondary to COPD exacerbation.  Her  functional status is greater than 4 mets and she lives independently.   PAST MEDICAL HISTORY:  1. Diabetes.  2. COPD.  3. Osteoarthritis.  4. PMR.  5. Hypothyroid.   ALLERGIES:  None.   MEDICATIONS:  1. Oxycodone as needed.  2. Demerol 50 mg 2-3 tabs as needed.  3. Humulin N 70 units q.a.m.  4. Humalog sliding scale insulin.  5. Glyburide 5 mg at bedtime as needed.  6. Levothyroxine 125 mcg daily.  7. Methenamine 500 mg daily.  8. Prednisone 5 mg and 1 mg tablets for a total of 8.5 mg daily.  9. Micardis 40 mg daily as needed.  10.Advair 100/50 two puffs b.i.d.  11.Meclizine 12 mg p.r.n.  12.Diazepam 2 mg as needed.  13.B12 supplement.  14.Calcium supplement.  15.Ocu-Vite once daily.   REVIEW OF SYSTEMS:  Negative except as per HPI.   SOCIAL HISTORY:  The patient lives independently.  Has no history of  alcohol, tobacco, or drug abuse.   FAMILY HISTORY:  Her  mother was deceased at age 96 of breast cancer.  Father deceased at age 67 with an enlarged heart.   PHYSICAL EXAMINATION:  VITAL SIGNS:  Temperature was 98, heart rate is  60, blood pressure is 181/72, oxygen saturation is 99% on room air.  GENERAL:  This is a pleasant elderly female in no acute distress.  HEENT:  Pupils are equal, round, and reactive to light.  Extraocular  muscles intact.  Sclerae anicteric.  ENT:  Mucous membranes are moist.  Nares are clear.  NECK:  Supple without lymphadenopathy.  No thyromegaly, bruit, or JVD.  CARDIOVASCULAR:  Heart rate is regular with normal S1/S2.  No S3 or S4.  There is a 3/6 systolic murmur at the right and left upper sternal  borders and PMI is laterally displaced.  RESPIRATORY:  Lungs are clear to auscultation bilaterally without  wheezes, rhonchi, or rales.  There is no increased work of breathing or  tachypnea.  ABDOMEN:  Soft and nontender with normal active bowel sounds.  EXTREMITIES:  Normal strength throughout.  No lower extremity edema.  NEUROLOGIC:  No focal deficits.  Alert and oriented x3.   LABORATORY DATA:  Labs are pending.   ASSESSMENT/PLAN:  This is a 77 year old female with diabetes mellitus,  COPD, PMR, and hypothyroid with L3 compression fracture admitted to the  neurosurgery service for decompression.   Regarding medical evaluation prior to surgery, she is low risk by  symptoms for a moderate risk procedure; however, prior to proceeding we  will need an EKG, BMP, CBC, TSH.  Will plan to continue her home blood  pressure medicine of Micardis, given hypertension noted on admission,  though BP may improve with adequate pain control.  I will not add a beta  blocker as her procedure is planned for tomorrow and there will not be  an adequate wash-in period.  Will continue her Advair for COPD.  She  currently does not have an exacerbation.  Cover with sliding scale  insulin q.a.c. and bedtime for diabetes and will resume  her home  diabetic medications after she has had her procedure and is able to  adequately take p.o.  Will order Senokot at bedtime as well as Colace  given the need for narcotic pain medications and potential constipation.  We will continue to follow.      Sarajane Marek, MD  Electronically Signed     HH/MEDQ  D:  12/27/2008  T:  12/27/2008  Job:  [id number]

## 2011-03-28 NOTE — Cardiovascular Report (Signed)
[place]  Patient:    Michelle [name], Michelle [name]                       MRN: [id number] Proc. Date: 03/15/01 Adm. Date:  [id number] Disc. Date: [id number] Attending:  Darlin Priestly CC:         Loma Sender, M.D.   Cardiac Catheterization  PROCEDURE: 1. Left heart catheterization. 2. Coronary angiography. 3. Left ventriclogram. 4. Abdominal aortogram.  COMPLICATIONS:  None.  INDICATIONS:  [name] is a 77 year old white female patient of Dr. Loma Sender and Dr. Lenise Herald with a history of hyperlipidemia, non-insulin-dependent diabetes mellitus, hypertension, Persantine Cardiolite revealing possible apical ischemia with ongoing dyspnea on exertion.  She is now brought to the catheterization lab to definitively define her coronary anatomy.  DESCRIPTION OF PROCEDURE:  After giving informed written consent, the patient was brought to the cardiac catheterization lab where her right and left groins were shaved, prepped and draped in the usual sterile fashion.  ECG monitoring was established.  Using modified Seldinger technique, a #6 French arterial sheath was inserted in the right femoral artery.  6 French diagnostic catheter was then used to perform diagnostic angiography.  This reveals medium size left main with no significant disease.  The LAD is a medium size vessel which courses around the apex and gave rise to one diagonal branch.  The LAD is noted to have some mild 30% proximal stenosis, but no further significant disease.  The first diagonal is a small vessel with no significant disease.  The left coronary artery also gave rise to a medium size ramus intermedius which bifurcates in its early portion.  There was no significant disease in the body of the ramus.  The left circumflex is a medium size vessel which courses the AV groove and gave rise to two obtuse marginal branches.  AV groove circumflex has no significant disease.   The first OM is a medium size vessel which bifurcates distally and has no significant disease.  The second OM is a medium size vessel which bifurcates in its proximal portion and has no significant disease.  The right coronary artery is a medium size vessel which is dominant and gives rise to a PDA as well as a posterolateral branch.  The RCA, PDA, and posterolateral branch had no significant disease.  Left ventriculogram reveals a low normal EF at 50%.  Abdominal aortogram reveals no evidence of significant renal artery stenosis. There is tortuous distal aorta.  HEMODYNAMICS:  Systemic arterial pressure 173/76, LV systolic pressure 168/12, LVEDP of 18.  CONCLUSION: 1. No significant coronary artery disease. 2. Low normal ejection fraction. 3. No evidence of significant renal artery stenosis. 4. Systemic hypertension. DD:  03/15/01 TD:  03/16/01 Job: [id number]

## 2011-03-28 NOTE — Assessment & Plan Note (Signed)
[facility]                             PULMONARY OFFICE NOTE   Michelle [name], Michelle [name]                       MRN:          [id number]  DATE:01/01/2007                            DOB:          01-11-1932    PROBLEMS:  1. Chronic obstructive pulmonary disease.  2. Polymyalgia rheumatica.  3. Diabetes.   HISTORY:  Six month follow up. Easy exertional dyspnea, but this has not  changed and she is comfortable at rest. Cough may be a little worse  described mostly as a dry hack without wheeze. There is no chest pain or  palpitation. No edema.   MEDICATIONS:  1. Micardis.  2. Synthroid.  3. Advair 100/50 using 1 puff daily.  4. Insulin.  5. Lipitor.  6. Prednisone 7.5 mg daily for her polymyalgia.  7. Occasional Phenergan with codeine cough syrup.   ALLERGIES:  No medication allergy.   She has had pneumococcal vaccine, once at least 5 years ago.   OBJECTIVE:  Weight 172 pounds, blood pressure 116/64, pulse regular 72,  room air saturation 99%. She is overweight and looks very comfortable.  Lungs sound clear and quiet today, breathing is unlabored. Heart sounds  are regular without murmur. There is no neck vein distension or edema.   IMPRESSION:  1. Mild chronic obstructive pulmonary disease. Pulmonary function      tests in 2006 had shown mainly obstructive changes in small airways      where there was some response to bronchodilator, normal lung      volumes, and normal diffusion.  2. Dyspnea with components of chronic obstructive pulmonary disease      and deconditioning.   PLAN:  1. Chest x-ray.  2. We gave her booster pneumococcal vaccine today.  3. We will call in a cough syrup p.r.n.  4. She will continue Advair, moving up to b.i.d. if needed.  5. Schedule return 6 months, earlier, p.r.n.     Michelle D. Maple Hudson, MD, Michelle [name], FACP  Electronically Signed    CDY/MedQ  DD: 01/01/2007  DT: 01/02/2007  Job #: [id number]   cc:   Michelle Rector, MD  Michelle [name], M.D.  Michelle [name], M.D.

## 2011-03-28 NOTE — Assessment & Plan Note (Signed)
[facility]                               PULMONARY OFFICE NOTE   Michelle, [name]                       MRN:          [id number]  DATE:06/30/2006                            DOB:          [birth year]    PROBLEM LIST:  1. Chronic obstructive pulmonary disease.  2. Polymyalgia rheumatica.  3. Diabetes.   HISTORY OF PRESENT ILLNESS:  She remains on low-dose prednisone for her  polymyalgia rheumatica and this likely helps her breathing some as well.  She reports little cough or phlegm.  She has managed to lose a little bit of  weight.  There have been no acute problems.  She stays on Advair once daily  because of cost and paces herself to control her shortness of breath.  She  denies chest pain and says her heart catheterization earlier this year was  okay.   MEDICATIONS:  1. Micardis.  2. Synthroid.  3. Advair 100/50.  4. Insulin.  5. Lipitor.  6. Prednisone 7.5 mg daily.   ALLERGIES:  NO KNOWN DRUG ALLERGIES.   OBJECTIVE:  VITAL SIGNS:  Weight 175 pounds.  BP 144/86, pulse regular at  69, room air saturation 98%.  GENERAL:  She remains obese.  HEART:  Heart sounds are regular without murmurs, rubs or gallops.  LUNGS:  Clear to auscultation and percussion with inspiration shallow  consistent with her body habitus.  NECK:  There is no neck vein distention, adenopathy or peripheral edema.   IMPRESSION:  Mild chronic obstructive pulmonary disease with small airway  flow slowing noted on PFT last August.  Clinically, she seems stable.  Exertional dyspnea is going to reflect deconditioning, probably some  weakness from steroids as well as any cardiopulmonary component.   PLAN:  1. No change to offer.  Advise continuing walking for weight loss and      endurance.  2. Scheduled to return in 6 months, earlier p.r.n.                                   Clinton D. Maple Hudson, MD, [facility], FACP   CDY/MedQ  DD:  07/01/2006  DT:  07/02/2006  Job #:   [id number]   cc:   Jaquita Rector, MD  Courtney Paris, MD  Areatha Keas, MD

## 2011-03-28 NOTE — Op Note (Signed)
   NAMENELLI, SWALLEY                          ACCOUNT NO.:  [id number]   MEDICAL RECORD NO.:  [id number]                   PATIENT TYPE:  OIB   LOCATION:  NA                                   FACILITY:  [facility]   PHYSICIAN:  Di Kindle. Edilia Bo, M.D.        DATE OF BIRTH:  1932/08/01   DATE OF PROCEDURE:  09/12/2002  DATE OF DISCHARGE:                                 OPERATIVE REPORT   PREOPERATIVE DIAGNOSES:  Headaches, rule out temporal arteritis.   POSTOPERATIVE DIAGNOSES:  Headaches, rule out temporal arteritis.   OPERATION PERFORMED:  Left temporal artery biopsy.   SURGEON:  Di Kindle. Edilia Bo, M.D.   ASSISTANT:  Nurse.   ANESTHESIA:  Local with sedation.   DESCRIPTION OF PROCEDURE:  The patient was taken to the operating room and  sedated by anesthesia.  The left forehead was prepped and draped in the  usual sterile fashion after the temporal artery had been marked with the  Doppler.  The skin was anesthetized with 1% lidocaine with epinephrine and  an oblique incision was made over the temporal artery.  The temporal artery  was dissected free and controlled with a silk tie.  It was then dissected  over a length of approximately 4 cm and ligated at both ends.  Next,  hemostasis was obtained in the wound.  The wound was closed with a deep  layer of interrupted 3-0 Vicryls and the skin closed with a 4-0 Vicryl  suture.  A sterile dressing was applied.  Dermabond was applied.  The  patient tolerated the procedure well and was transferred to the recovery  room in satisfactory condition.  Sponge and needle counts were correct.                                                 Di Kindle. Edilia Bo, M.D.    CSD/MEDQ  D:  09/12/2002  T:  09/12/2002  Job:  [id number]

## 2011-03-28 NOTE — Cardiovascular Report (Signed)
NAMEROSILAND, SEN                ACCOUNT NO.:  [id number]   MEDICAL RECORD NO.:  [id number]          PATIENT TYPE:  INP   LOCATION:  1829                         FACILITY:  [facility]   PHYSICIAN:  Cristy Hilts. Jacinto Halim, MD       DATE OF BIRTH:  1932/10/01   DATE OF PROCEDURE:  01/29/2006  DATE OF DISCHARGE:                              CARDIAC CATHETERIZATION   ATTENDING CARDIOLOGIST:  Darlin Priestly, M.D.   REFERRING PHYSICIAN:  Loma Sender, M.D.   PROCEDURES:  1.  Left ventriculography.  2.  Selective right and left coronary angiography.  3.  Ascending aortogram.  4.  Right femoral angiography and closure of right femoral arterial access      with Star-Close.   INDICATIONS:  Ms. Amethyst Gainer is a 77 year old fairly active Caucasian  female with a history of noncritical coronary disease by cardiac  catheterization done on Mar 15, 2001, in the form of 10% luminal obstruction  in the mid-LAD but otherwise normal coronary arteries with mild  calcification.  She was seen on the outpatient basis by Dr. Jenne Campus for  chest pain suggestive of angina pectoris that started last night.  Because  of persistent chest pain, she was sent to the emergency room with an eye  toward diagnostic cardiac catheterization for a definite diagnosis of  coronary disease.   Ascending aortogram was performed to evaluate for ascending aortic aneurysm  or aortic dissection because of her chest pain.   HEMODYNAMIC DATA:  The left ventricular pressures were 139/9 with end-  diastolic pressure of 13 mmHg.  The aortic pressure was 113/62 with a mean  of 93 mmHg.  There was no pressure gradient across the aortic valve.   ANGIOGRAPHIC DATA:  Left ventricle:  Left ventricular systolic function was  normal with an ejection fraction of 60%.  There was no significant mitral  regurgitation.   Right coronary artery:  The right coronary artery is a large-caliber vessel.  It gives origin to a large PDA.  It also gives  origin to a moderate-sized  PLA.  It has mild calcification in the ostium but otherwise no significant  luminal obstruction.   Left main:  The left main has mild calcification but is short and a large-  caliber vessel.  There is no luminal obstruction.   Circumflex:  The circumflex is a large-caliber vessel.  It gives origin to a  large obtuse marginal #1 and a moderate-sized OM-2 and continues as an OM-3  after giving the AV groove branch.  It has mild calcification in the  proximal segment but otherwise widely patent.   LAD:  The LAD is a large-caliber vessel.  It gives origin to several small  to moderate-sized diagonals.  There is a 10% luminal irregularity noted in  the mid-LAD which is unchanged from 2002 cardiac catheterization.  In the  mid to distal LAD, mild bridging is noted.   Ascending aortogram:  The ascending aortogram reveals the presence of three  aortic valve cusps.  There is no aortic dissection, no aortic regurgitation  of abdominal aortic aneurysm.  IMPRESSION:  1.  No significant coronary disease by cardiac catheterization.  Coronary      anatomy unchanged from cardiac catheterization in 2002.  2.  Normal left ventricular systolic function.  3.  No abdominal aortic aneurysm or aortic dissection.   RECOMMENDATIONS:  Evaluation for noncardiac cause of chest pain is  indicated.  The patient has been closed with Star-Close, and she can be  discharged home this night.  If she continues to have significant chest  discomfort, GI evaluation is indicated.   TECHNIQUE OF THE PROCEDURE:  Under the usual sterile precaution using a 6  French right femoral arterial access, a 6 [place] multipurpose B2 catheter  was advanced to the ascending aorta over a 0.035 J-wire.  The catheter was  gently advanced into the left ventricle.  Left ventricular pressures were  monitored.  Left ventriculography was performed both in LAO and RAO  projection.  The catheter was then pulled back  into the ascending aorta.  Pressure gradient across the aortic valve was monitored.  The right coronary  artery was selectively engaged and angiography was performed.  Then the left  main coronary artery was selectively engaged and angiography was performed.  Then the catheter was pulled back into the root of the aorta and ascending  aortogram was performed in the LAO projection.  The catheter was then pulled  out of the body in the usual fashion.  Right femoral angiography was  performed through the arterial access sheath and the access was closed with  Star-Close with excellent hemostasis.  The patient tolerated the procedure  well.  No immediate complication noted.      Cristy Hilts. Jacinto Halim, MD  Electronically Signed     JRG/MEDQ  D:  01/29/2006  T:  01/31/2006  Job:  [id number]   cc:   Darlin Priestly, MD  Fax: [phone]   Loma Sender  Fax: [phone]

## 2011-03-28 NOTE — Op Note (Signed)
[place]  Patient:    Michelle [name], Michelle [name]                       MRN: [id number] Proc. Date: 02/20/00 Adm. Date:  [id number] Attending:  Marlowe Kays Page                           Operative Report  PREOPERATIVE DIAGNOSES: 1. Chronic impingement syndrome, left shoulder. 2. Torn rotator cuff, right shoulder.  POSTOPERATIVE DIAGNOSES: 1. Chronic impingement syndrome, left shoulder. 2. Torn rotator cuff, right shoulder.  OPERATION: 1. Subacromial injection left shoulder with steroid and Marcaine. 2. Anterior acromionectomy repair of torn rotator cuff right shoulder.  SURGEON:  Illene Labrador. [place], M.D.  ASSISTANTDruscilla Brownie. Shela Nevin, P.A.  ANESTHESIA:  General.  DESCRIPTION OF PROCEDURE:  She has had complaints of pain and weakness in both shoulders.  An arthrogram of the left shoulder has been negative but on the right shoulder there is a small full thickness tear.  This was confirmed at surgery. She also has subacromial spurs in both shoulders.  PROCEDURE:  After satisfactory general anesthesia and prophylactic antibiotics, the left shoulder was prepped with Betadine and the subacromial area was injected with 40 mg of Aristospan and 5 cc of 0.50% plain Marcaine.  She was then placed in the beach-chair position and the right shoulder girdle was prepped with duraprep, draped in the sterile field, Ioban employed.  Vertical incision from the [facility] joint down the greater tuberosity.  The fascia over the anterior acromion was in opened in line with the skin incision, and the deltoid split for 1 cm or so.  The [facility] joint was identified with a Mellody Dance needle and I marked out the tentative ine for anterior acromionectomy with a cutting cautery.  I then placed a small Cobb  elevator beneath the anterior acromion followed by a larger one to protect the underlying rotator cuff and made my initial anterior acromionectomy with a micro saw.  I  then trimmed up additional bone on the inferior surface to complete a release impingement.  She had a thick subdeltoid bursa which were also excised nd contributing to an impingement problem.  I then inspected the rotator cuff. She had a nickel-sized area of partial thickness loss of the rotator cuff due to abrasion as well as a small ballpoint size hole just above the greater tuberosity.  What I was able to demonstrate was that the superficial ______ portion of rotator cuff I was able to advance it over the torn area and bring it down to the greater tuberosity where I then stabilized it advancing the rotator cuff as well with 3  mitek plus anchors.  The two suture arms in each were then brought through additional rotator cuff tissue and tied to one another.  I not only advanced the rotator cuff but also covered up the small tear.  I then supplemented this with  interrupted 1 Vicryl sutures to residual lateral tissue distal to the greater tuberosity.  This gave a nice stable repair.  The wound was then irrigated with  sterile saline.  Soft tissue was infiltrated with 0.50% plain Marcaine.  The deltoid muscle was reapproximated where I had split it in 2 layers of interrupted 1 Vicryl then reattached to the residual anterior acromion through drill holes with interrupted 1 Vicryl as well.  This was done with the arm abducted and gave a  nice stable repair.  Subcutaneous tissue was then closed with a combination of 1 and 2-0 Vicryl and steri-strips were applied to the skin followed by a dry sterile dressing.  Her arm was placed in a shoulder immobilizer.  She was taken to recovery in satisfactory condition with no immediate complications. DD:  02/20/00 TD:  02/21/00 Job: [id number]

## 2011-03-28 NOTE — H&P (Signed)
[place]  Patient:    Michelle [name], Michelle [name]                         MRN: [id number] Adm. Date:  02/20/00 Attending:  Fayrene Fearing P. Aplington, M.D. Dictator:   Della Goo, P.A. CC:         Dr. Smitty Cords, [address], [place], [place] [address]                         History and Physical  CHIEF COMPLAINT:  Bilateral shoulder pain, right greater than left.  HISTORY OF PRESENT ILLNESS:  Michelle [name] is a 77 year old white female with several months duration of bilateral shoulder pain.  She states that over the past couple of years she has noted pain in the shoulder occasionally, which has been relieved with rest and medications.  Approximately a year ago the shoulder pain became much worse and she was unable to have it evaluated secondary to she was caring for her husband who had cancer.  He recently has died and she now felt she could get further evaluation.  At this point, she is having complete dysfunction of the right shoulder.  She is unable to comb her hair or fasten her bra secondary to pain. In the mornings when she is trying to dress, she states her pain is excruciating, especially in the right shoulder, and she at times is in tears.  The patient also has considerable night pain and has been unable to rest for several months now secondary to the pain in her right shoulder.  The patient was sent for bilateral shoulder arthrograms which showed a complete rotator cuff tear of the right shoulder, however, no evidence of rotator cuff tear of the left shoulder.  Due o her significant findings on arthrogram, as well as her continued symptoms of pain and dysfunction of the right shoulder, it was felt she will require surgical intervention, and she is being admitted to undergo repair of the rotator cuff tear of the right shoulder, as well as anterior acromionectomy of the right shoulder. This will be performed by Dr. Simonne Come at [facility].  CURRENT MEDICATIONS:  Humulin insulin N.  The patient states she ordinarily takes 30 units in a.m. and 20-30 units in p.m.  This is dependent upon her diet, as well as a sliding scale, and she does check her blood sugars.  She is on Synthroid 0.125 mcg q.d., pindolol 5 mg 2 tablets once a day.  Micardis 80 mg 1 tablet daily. Allegra 60 mg 1 tablet daily.  Premarin 0.625 mg 1 a day for 25 days.  Provera 2.5 mg 1 a day for 10 days.  Vitamin B12 1000 mg once daily.  Aspirin once daily. Chlordiazepoxide 10 mg as needed for depression and anxiety.  Meclizine 12.5 mg 1/2 tablet as needed for dizziness.  Hydrocodone as needed for pain.  ALLERGIES:  No known drug allergies.  PAST MEDICAL HISTORY:  History taken from the patient and include remote history of anemia requiring blood transfusion after a childbirth.  She has history of migraine headaches and has been evaluated by a neurologist with an MRI of the brain being negative.  The patient has chronic bronchitis and utilizes occasional inhalers nd does have a chronic cough.  She has history of bradycardia, hypertension, hiatal hernia, kidney stones treated with lithotripsy in the past, insulin-dependent diabetes mellitus, frequent UTIs and  occasional urinary incontinence. 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  The patient has ad recent situational anxiety and depression secondary to her husbands death in 01-15-2023.  PAST SURGICAL HISTORY:  Tonsillectomy as a child.  Status post lithotripsy x 1.  Exploratory laparoscopy 15-20 years ago.  Vaginal hysterectomy, cholecystectomy, and left breast biopsy x 2.  SOCIAL HISTORY:  The patient currently is living alone.  She has daughters who ive in close proximity.  She has a one-level home.  She has no intake of tobacco or  alcohol.  She will have assistance at home by her  family after her discharge. The patients family medical doctor is Dr. Smitty Cords.  Her neurologist is Dr. Buzzy Han. Her cardiologist is Dr. Mayford Knife.  Her urologist is Dr. Aldean Ast.  Her general surgeon is Dr. Samuella Cota.  FAMILY HISTORY:  The patients mother died at age 61 of breast cancer.  The patients father died at age 56 with heart disease, congestive heart failure, and history of an MI.  REVIEW OF SYSTEMS:  CNS:  The patient denies blurred vision, double vision, seizure disorder, or paralysis.  She continues to have occasional migraine headaches and uses Vicodin for relief.  She has corrective lenses for vision. Cardiorespiratory: No recent chest pain or shortness of breath.  She does have a chronic cough and  states that in the morning this is worse.  At times in the morning she will have some production, but it is usually clear.  She does not require her inhaler very often, however, does have one if needed for her chronic bronchitis.  She has a history of bradycardia and is unsure if this is related to a change in hypertension medications at one point, however, does state that her heartrate was in the 30s and she had difficulty functioning and had to be hospitalized for evaluation. GU/GI: No nausea, vomiting, diarrhea, or constipation.  She does have a hiatal hernia,  however, states it is asymptomatic and she does not require any medications. She does not even use occasional over-the-counter medications.  She has no dysuria,  hematuria, melena, or bloody stools.  She does have frequent UTIs secondary to er diabetes and some occasional urinary incontinence.  Musculoskeletal:  The patient has been told she has significant arthritis of the cervical spine.  This was found on evaluation of her headaches.  She currently has no neck pain or symptoms of radiculopathy into the upper extremities.  She has had a fracture to the left wrist several years ago and this was treated with a  cast without any difficulties. Hematologically, the patient denies history of jaundice, hepatitis, bleeding tendencies, or blood clots.  As stated above, she did require blood transfusion  some years ago after childbirth.  In general, she has had recent anxiety and  depression mostly related to her husbands death.  She has been having difficulty with sleeping and has lack of energy.  She feels lethargic and apathetic most of the time per history.  PHYSICAL EXAMINATION:  VITAL SIGNS:  The patient is afebrile, pulse 74 and regular, blood pressure 140/80.  GENERAL:  She is a well-developed, well-nourished, white female who is alert and oriented x 4 and in no acute distress.  HEENT:  Normocephalic, atraumatic.  Pupils equal, round, and reactive to light nd accommodation.  Extraocular movements intact.  LUNGS:  Clear to auscultation with some distant sounds in the lower lobes.  HEART:  Regular rate and  rhythm.  There is a grade 1/6 systolic murmur heard best at the left sternal border.  ABDOMEN:  Soft and nontender.  Bowel sounds present.  GENITAL, RECTAL, BREASTS:  Exam not performed.  Not pertinent to present illness.  EXTREMITIES:  Examination of the upper extremities reveal discomfort with range of motion of the right shoulder, both passively and actively.  She has weakness against resistance with rotator cuff testing.  She has some tenderness over the [facility] joint and the acromion of the right shoulder.  She also has some mild discomfort with range of motion of the left shoulder, however, no weakness with testing. n the upper extremities, her reflexes are equal at biceps, triceps, and brachioradialis.  Radial pulses are intact bilaterally.  There is no clubbing, cyanosis or edema.  In the lower extremities, neurovascular and motor function s intact.  She has no ulcerations in the lower extremities.  There are a few broken capillaries at the area of ankle.  There is no  pitting edema noted.  IMPRESSION:  1. Rotator cuff tear of the right shoulder.  2. Insulin-dependent diabetes mellitus.  3. Cardiac arrhythmia.  4. Hypertension.  5. Hypothyroidism.  6. Migraine headaches.  7. History of vertigo.  8. Chronic bronchitis with chronic cough.  9. History of frequent urinary tract infections. 10. History of degenerative arthritis. 11. Recent depression and anxiety secondary to husbands death.  PLAN:  The patient will be admitted to [facility] to undergo rotator cuff repair of the right shoulder and anterior acromionectomy of the right shoulder.  Preop  clearance will be given by her cardiologist and a stress test will be performed  prior to surgery.  The patient will have family assistance at discharge at home. DD:  02/19/00 TD:  02/19/00 Job: [id number]

## 2011-06-09 ENCOUNTER — Encounter (INDEPENDENT_AMBULATORY_CARE_PROVIDER_SITE_OTHER): Payer: BLUE CROSS/BLUE SHIELD | Admitting: Ophthalmology

## 2011-06-09 DIAGNOSIS — D3130 Benign neoplasm of unspecified choroid: Secondary | ICD-10-CM

## 2011-06-09 DIAGNOSIS — H3530 Unspecified macular degeneration: Secondary | ICD-10-CM

## 2011-06-09 DIAGNOSIS — H43819 Vitreous degeneration, unspecified eye: Secondary | ICD-10-CM

## 2011-06-09 DIAGNOSIS — E11319 Type 2 diabetes mellitus with unspecified diabetic retinopathy without macular edema: Secondary | ICD-10-CM

## 2011-08-15 LAB — I-STAT 8, (EC8 V) (CONVERTED LAB)
Bicarbonate: 26.4 — ABNORMAL HIGH
Glucose, Bld: 32 — CL
Sodium: 140
TCO2: 28
pH, Ven: 7.343 — ABNORMAL HIGH

## 2011-08-22 ENCOUNTER — Ambulatory Visit (INDEPENDENT_AMBULATORY_CARE_PROVIDER_SITE_OTHER)
Admission: RE | Admit: 2011-08-22 | Discharge: 2011-08-22 | Disposition: A | Discharge status: Home / Self Care | Payer: BLUE CROSS/BLUE SHIELD | Source: Ambulatory Visit | Attending: Internal Medicine | Admitting: Internal Medicine

## 2011-08-22 ENCOUNTER — Ambulatory Visit (INDEPENDENT_AMBULATORY_CARE_PROVIDER_SITE_OTHER): Payer: BLUE CROSS/BLUE SHIELD | Admitting: Internal Medicine

## 2011-08-22 ENCOUNTER — Encounter: Payer: Self-pay | Admitting: Internal Medicine

## 2011-08-22 VITALS — BP 148/84 | HR 56 | Ht 62.0 in | Wt 163.2 lb

## 2011-08-22 DIAGNOSIS — Z23 Encounter for immunization: Secondary | ICD-10-CM

## 2011-08-22 DIAGNOSIS — J449 Chronic obstructive pulmonary disease, unspecified: Secondary | ICD-10-CM

## 2011-08-22 MED ORDER — FLUTICASONE-SALMETEROL 100-50 MCG/DOSE IN AEPB: 1.0000 | INHALATION_SPRAY | Freq: Two times a day (BID) | RESPIRATORY_TRACT | Status: DC

## 2011-08-22 NOTE — Patient Instructions (Addendum)
Order- CXR- COPD              [facility]- please evaluate for Spiriva assistance program---there is script on med list  Flu vax  Sample/ script Advair 100/50

## 2011-08-22 NOTE — Progress Notes (Signed)
Patient ID: Michelle [name], female    DOB: 03/04/1932, 77 y.o.   MRN: [id number]  HPI 02/20/11-78 yoF never smoker, followed for COPD/ asthma, complicated by Polymyalgia Rheumatica. Last here February 21, 2010. Husband was a long time smoker- so she had significant second hand exposure and has continued also to heat with wood . She ran out of Advair using once daily, when she reached donut hole status last November. Didn't use it at all November and December. Now back at once daily. Notices some modest benefit, but still can't afford the Advair and asks alternatives. Has not had a rescue inhaler. Wheeze most days, sometimes worse than others. Cough scant sputum most mornings.  Blows nose. Denies chest pain. Has had occasional palpitations. Sees Dr Lynnea Ferrier for cardiology. Tolerated right knee replacement last year.   08/22/11- 78 yoF never smoker, followed for COPD/ asthma, complicated by Polymyalgia Rheumatica Spiriva well but was too expensive. She tried Atrovent HFA but says it caused nausea. She is limiting Advair to  Medicare gap cost. She remains aware of shortness of breath with exertion on hills and stairs and says this may be very slowly getting worse without acute change. She notices more clear phlegm coughed up in the mornings. She has long-standing pain in the right anterior chest wall with deep breath. This has been going on for years.  Review of Systems Per HPI Constitutional:   No-   weight loss, night sweats, fevers, chills, fatigue, lassitude. HEENT:   No-  headaches, difficulty swallowing, tooth/dental problems, sore throat,       No-  sneezing, itching, ear ache, nasal congestion, post nasal drip,  CV:  + chest pain, no- orthopnea, PND, swelling in lower extremities, anasarca,  dizziness, palpitations Resp: +  shortness of breath with exertion or at rest.              +  productive cough,  No non-productive cough,  No-  coughing up of blood.              No-   change in color of  mucus.  No- wheezing.   Skin: No-   rash or lesions. GI:  No-   heartburn, indigestion, abdominal pain, nausea, vomiting, diarrhea,                 change in bowel habits, loss of appetite GU: No-   dysuria, change in color of urine, no urgency or frequency.  No- flank pain. MS:  No-   joint pain or swelling.  No- decreased range of motion.  No- back pain. Neuro- grossly normal to observation, Or:  Psych:  No- change in mood or affect. No depression or anxiety.  No memory loss.      Objective:   Physical Exam General- Alert, Oriented, Affect-appropriate, Distress- none acute. Talkative Skin- rash-none, lesions- none, excoriation- none Lymphadenopathy- none Head- atraumatic            Eyes- Gross vision intact, PERRLA, conjunctivae clear secretions            Ears- Hearing, canals-normal            Nose- Clear, no-Septal dev, mucus, polyps, erosion, perforation             Throat- Mallampati II , mucosa clear , drainage- none, tonsils- atrophic Neck- flexible , trachea midline, no stridor , thyroid nl, carotid no bruit Chest - symmetrical excursion , unlabored  Heart/CV- RRR , no murmur , no gallop  , no rub, nl s1 s2                           - JVD- none , edema- none, stasis changes- none, varices- none           Lung- clear to P&A, but distant.,  wheeze- none, cough- none , dullness-none, rub- none           Chest wall-  Abd- tender-no, distended-no, bowel sounds-present, HSM- no Br/ Gen/ Rectal- Not done, not indicated Extrem- cyanosis- none, clubbing, none, atrophy- none, strength- nl Neuro- grossly intact to observation

## 2011-08-24 NOTE — Assessment & Plan Note (Signed)
She is experiencing the slow normal progression of this disease. It is impacting her exercise tolerance a little. We have discussed ways to maintain endurance. She also needs some help with cost of medication if possible. Flu vaccine, chest x-ray.

## 2011-08-27 NOTE — Progress Notes (Signed)
Quick Note:  Pt aware of results. ______ 

## 2012-02-16 ENCOUNTER — Other Ambulatory Visit: Payer: Self-pay

## 2012-02-16 ENCOUNTER — Other Ambulatory Visit (INDEPENDENT_AMBULATORY_CARE_PROVIDER_SITE_OTHER): Payer: Self-pay | Admitting: General Surgery

## 2012-02-16 DIAGNOSIS — Z1231 Encounter for screening mammogram for malignant neoplasm of breast: Secondary | ICD-10-CM

## 2012-02-20 ENCOUNTER — Ambulatory Visit: Payer: Medicare Other | Admitting: Internal Medicine

## 2012-02-23 ENCOUNTER — Ambulatory Visit (INDEPENDENT_AMBULATORY_CARE_PROVIDER_SITE_OTHER): Payer: Medicare Other | Admitting: Internal Medicine

## 2012-02-23 ENCOUNTER — Encounter: Payer: Self-pay | Admitting: Internal Medicine

## 2012-02-23 VITALS — BP 136/56 | HR 60 | Ht 61.0 in | Wt 159.0 lb

## 2012-02-23 DIAGNOSIS — J449 Chronic obstructive pulmonary disease, unspecified: Secondary | ICD-10-CM

## 2012-02-23 DIAGNOSIS — M353 Polymyalgia rheumatica: Secondary | ICD-10-CM

## 2012-02-23 MED ORDER — TIOTROPIUM BROMIDE MONOHYDRATE 18 MCG IN CAPS: 18.0000 ug | ORAL_CAPSULE | Freq: Every day | RESPIRATORY_TRACT | Status: DC

## 2012-02-23 NOTE — Progress Notes (Signed)
Patient ID: Michelle [name], female    DOB: Sep 25, 1932, 78 y.o.   MRN: [id number]  HPI 02/20/11-78 yoF never smoker, followed for COPD/ asthma, complicated by Polymyalgia Rheumatica. Last here February 21, 2010. Husband was a long time smoker- so she had significant second hand exposure and has continued also to heat with wood . She ran out of Advair using once daily, when she reached donut hole status last November. Didn't use it at all November and December. Now back at once daily. Notices some modest benefit, but still can't afford the Advair and asks alternatives. Has not had a rescue inhaler. Wheeze most days, sometimes worse than others. Cough scant sputum most mornings.  Blows nose. Denies chest pain. Has had occasional palpitations. Sees Dr Lynnea Ferrier for cardiology. Tolerated right knee replacement last year.   08/22/11- 78 yoF never smoker, followed for COPD/ asthma, complicated by Polymyalgia Rheumatica Spiriva well but was too expensive. She tried Atrovent HFA but says it caused nausea. She is limiting Advair to  Medicare gap cost. She remains aware of shortness of breath with exertion on hills and stairs and says this may be very slowly getting worse without acute change. She notices more clear phlegm coughed up in the mornings. She has long-standing pain in the right anterior chest wall with deep breath. This has been going on for years.  02/23/12- 78 yoF never smoker, followed for COPD/ asthma, complicated by Polymyalgia Rheumatica PCP Dr Leonette Most Phillips/ [place] No significant respiratory infection all winter. Occasional minimal wheeze. Limited more by arthritis done by her breathing. Can't usually afford Advair. Gets assistance with Spiriva which is sufficient.  Review of Systems-Per HPI Constitutional:   No-   weight loss, night sweats, fevers, chills, fatigue, lassitude. HEENT:   No-  headaches, difficulty swallowing, tooth/dental problems, sore throat,       No-  sneezing,  itching, ear ache, nasal congestion, post nasal drip,  CV:  No- chest pain, no- orthopnea, PND, swelling in lower extremities, anasarca,  dizziness, palpitations Resp: +  shortness of breath with exertion or at rest.             No- productive cough,  No non-productive cough,  No-  coughing up of blood.              No-   change in color of mucus.  No- wheezing.   Skin: No-   rash or lesions. GI:  No-   heartburn, indigestion, abdominal pain, nausea, vomiting,  GU:  MS:  No-   joint pain or swelling.  Neuro- nothing unusual Psych:  No- change in mood or affect. No depression or anxiety.  No memory loss.      Objective:   Physical Exam General- Alert, Oriented, Affect-appropriate, Distress- none acute. Talkative Skin- rash-none, lesions- none, excoriation- none Lymphadenopathy- none Head- atraumatic            Eyes- Gross vision intact, PERRLA, conjunctivae clear secretions            Ears- Hearing, canals-normal            Nose- Clear, no-Septal dev, mucus, polyps, erosion, perforation             Throat- Mallampati II , mucosa clear , drainage- none, tonsils- atrophic Neck- flexible , trachea midline, no stridor , thyroid nl, carotid no bruit Chest - symmetrical excursion , unlabored           Heart/CV- RRR , faint systolic Ao  murmur , no gallop  , no rub, nl s1 s2                           - JVD- none , edema- none, stasis changes- none, varices- none           Lung- clear to P&A, but distant.,  wheeze- none, cough- none , dullness-none, rub- none           Chest wall-  Abd-  Br/ Gen/ Rectal- Not done, not indicated Extrem- cyanosis- none, clubbing, none, atrophy- none, strength- nl Neuro- grossly intact to observation

## 2012-02-23 NOTE — Patient Instructions (Signed)
Please call as needed  We will see if the Spiriva is sufficient to hold you without the Advair.

## 2012-02-26 NOTE — Assessment & Plan Note (Signed)
Spiriva may be sufficient. I have encouraged regular walking for endurance.

## 2012-02-26 NOTE — Assessment & Plan Note (Signed)
Low-dose prednisone is not hurting her breathing. We discussed prednisone side effects.

## 2012-03-10 ENCOUNTER — Encounter: Payer: Self-pay | Admitting: Internal Medicine

## 2012-03-12 ENCOUNTER — Telehealth: Payer: Self-pay | Admitting: Internal Medicine

## 2012-03-12 MED ORDER — TIOTROPIUM BROMIDE MONOHYDRATE 18 MCG IN CAPS: 18.0000 ug | ORAL_CAPSULE | Freq: Every day | RESPIRATORY_TRACT | Status: DC

## 2012-03-12 NOTE — Telephone Encounter (Signed)
Called, spoke with pt.  She is aware spiriva rx has been faxed as requested.  She verbalized understanding of this.

## 2012-03-12 NOTE — Telephone Encounter (Signed)
This has been faxed back as requested.  

## 2012-03-12 NOTE — Telephone Encounter (Signed)
I spoke with pt and she needs her spiriva rx faxed for her PA assistance. She was approved and they are just awaiting [place] rx to [place] faxed to them. i have printed off rx and was placed on CDY cart for signature. Please advise Dr. Maple Hudson, thanks

## 2012-03-18 ENCOUNTER — Ambulatory Visit
Admission: RE | Admit: 2012-03-18 | Discharge: 2012-03-18 | Disposition: A | Discharge status: Home / Self Care | Payer: Medicare Other | Source: Ambulatory Visit

## 2012-03-18 ENCOUNTER — Ambulatory Visit: Payer: Medicare Other

## 2012-03-18 DIAGNOSIS — Z1231 Encounter for screening mammogram for malignant neoplasm of breast: Secondary | ICD-10-CM

## 2012-03-23 ENCOUNTER — Telehealth: Payer: Self-pay | Admitting: Internal Medicine

## 2012-03-23 NOTE — Telephone Encounter (Signed)
lmomtcb x1 for Michelle [name]

## 2012-03-24 ENCOUNTER — Telehealth: Payer: Self-pay | Admitting: Internal Medicine

## 2012-03-24 NOTE — Telephone Encounter (Signed)
lmomtcb  

## 2012-03-24 NOTE — Telephone Encounter (Signed)
I spoke with Michelle [name] and she was calling to verify that we did send them the rx for spiriva. I advised her yes and nothing further was needed

## 2012-03-25 NOTE — Telephone Encounter (Signed)
See 5/15 phone note 

## 2012-04-20 ENCOUNTER — Ambulatory Visit: Payer: Medicare Other | Admitting: Internal Medicine

## 2012-06-14 ENCOUNTER — Ambulatory Visit (INDEPENDENT_AMBULATORY_CARE_PROVIDER_SITE_OTHER): Payer: Medicare Other | Admitting: Ophthalmology

## 2012-06-14 DIAGNOSIS — D3130 Benign neoplasm of unspecified choroid: Secondary | ICD-10-CM

## 2012-06-14 DIAGNOSIS — E1139 Type 2 diabetes mellitus with other diabetic ophthalmic complication: Secondary | ICD-10-CM

## 2012-06-14 DIAGNOSIS — E11319 Type 2 diabetes mellitus with unspecified diabetic retinopathy without macular edema: Secondary | ICD-10-CM

## 2012-06-14 DIAGNOSIS — I10 Essential (primary) hypertension: Secondary | ICD-10-CM

## 2012-06-14 DIAGNOSIS — H43819 Vitreous degeneration, unspecified eye: Secondary | ICD-10-CM

## 2012-06-14 DIAGNOSIS — H35039 Hypertensive retinopathy, unspecified eye: Secondary | ICD-10-CM

## 2013-02-08 ENCOUNTER — Encounter: Payer: Self-pay | Admitting: Internal Medicine

## 2013-02-14 ENCOUNTER — Encounter: Payer: Self-pay | Admitting: Internal Medicine

## 2013-02-14 ENCOUNTER — Ambulatory Visit (INDEPENDENT_AMBULATORY_CARE_PROVIDER_SITE_OTHER): Payer: Medicare Other | Admitting: Internal Medicine

## 2013-02-14 ENCOUNTER — Other Ambulatory Visit (INDEPENDENT_AMBULATORY_CARE_PROVIDER_SITE_OTHER): Payer: Medicare Other

## 2013-02-14 VITALS — BP 140/80 | HR 88 | Ht 61.0 in | Wt 147.4 lb

## 2013-02-14 DIAGNOSIS — E039 Hypothyroidism, unspecified: Secondary | ICD-10-CM

## 2013-02-14 DIAGNOSIS — R198 Other specified symptoms and signs involving the digestive system and abdomen: Secondary | ICD-10-CM

## 2013-02-14 DIAGNOSIS — R634 Abnormal weight loss: Secondary | ICD-10-CM

## 2013-02-14 DIAGNOSIS — R197 Diarrhea, unspecified: Secondary | ICD-10-CM

## 2013-02-14 LAB — BASIC METABOLIC PANEL
BUN: 22 mg/dL (ref 6–23)
GFR: 56.29 mL/min — ABNORMAL LOW (ref >60.00)
Potassium: 3.7 mEq/L (ref 3.5–5.1)
Sodium: 135 mEq/L (ref 135–145)

## 2013-02-14 LAB — CBC WITH DIFFERENTIAL/PLATELET
Eosinophils Relative: 0.2 % (ref 0.0–5.0)
HCT: 37.4 % (ref 36.0–46.0)
Hemoglobin: 12.6 g/dL (ref 12.0–15.0)
Lymphs Abs: 2.1 10*3/uL (ref 0.7–4.0)
MCV: 89.6 fl (ref 78.0–100.0)
Monocytes Absolute: 0.6 10*3/uL (ref 0.1–1.0)
Neutro Abs: 4.3 10*3/uL (ref 1.4–7.7)
Platelets: 246 10*3/uL (ref 150.0–400.0)
WBC: 7 10*3/uL (ref 4.5–10.5)

## 2013-02-14 LAB — HEPATIC FUNCTION PANEL
AST: 22 U/L (ref 0–37)
Alkaline Phosphatase: 75 U/L (ref 39–117)
Total Bilirubin: 0.8 mg/dL (ref 0.3–1.2)

## 2013-02-14 LAB — TSH: TSH: 0.68 u[IU]/mL (ref 0.35–5.50)

## 2013-02-14 MED ORDER — METRONIDAZOLE 250 MG PO TABS: 250.0000 mg | ORAL_TABLET | Freq: Four times a day (QID) | ORAL | Status: DC

## 2013-02-14 NOTE — Patient Instructions (Addendum)
Your physician has requested that you go to the basement for lab work before leaving today  We have sent the following medications to your pharmacy for you to pick up at your convenience:  Metronidazole  Please follow up with Dr. Marina Goodell in 4 weeks

## 2013-02-14 NOTE — Progress Notes (Signed)
HISTORY OF PRESENT ILLNESS:  Michelle [name] is a 79 y.o. female with asthma, arthritis, insulin requiring diabetes mellitus, COPD, and GERD. She presents today regarding change in bowel habits and incontinence. Patient is new to this practice, but had a similar problem last year for which she had an appointment, but canceled, having improved. I see some of her family members. In any event, she reports a one-month history of change in bowel habits as manifested by 2-3 bowel movements per day, generally loose with mucus, but no blood. This as opposed 1 normal formed bowel movement daily. She also has had some incontinent episodes when she is unable to distinguish between the flatus and stool. She denies abdominal pain. She has had weight loss but cannot specify. She did receive some antibiotics for dental work about a month ago. She does take Imodium once daily twice per day, this helps. No fevers. She denies prior GI evaluations. No prior colonoscopy. She is on pain medication in addition to insulin and prednisone. She is status post cholecystectomy and hysterectomy  REVIEW OF SYSTEMS:  All non-GI ROS negative except for arthritis, back pain, fatigue, shortness of breath, urinary leakage  Past Medical History  Diagnosis Date  . Type II or unspecified type diabetes mellitus without mention of complication, not stated as uncontrolled   . Other and unspecified hyperlipidemia   . Asthma   . Arthritis   . Gallstones   . COPD (chronic obstructive pulmonary disease)   . Interstitial cystitis   . UTI (lower urinary tract infection)     Past Surgical History  Procedure Laterality Date  . Lumbar disc surgery    . Total abdominal hysterectomy    . Rotator cuff repair    . Tonsillectomy    . Cervical spine surgery    . Cholecystectomy    . Hernia repair      Social History Michelle [name]  reports that she has never smoked. She has never used smokeless tobacco. She reports that she does not drink  alcohol or use illicit drugs.  family history includes Breast cancer in her mother; Heart disease in her father; and Lung cancer in her brother.  No Known Allergies     PHYSICAL EXAMINATION: Vital signs: BP 140/80  Pulse 88  Ht 5\' 1"  (1.549 m)  Wt 147 lb 6.4 oz (66.86 kg)  BMI 27.87 kg/m2  SpO2 92%  Constitutional: generally well-appearing, no acute distress Psychiatric: alert and oriented x3, cooperative Eyes: extraocular movements intact, anicteric, conjunctiva pink Mouth: oral pharynx moist, no lesions Neck: supple no lymphadenopathy Cardiovascular: heart regular rate and rhythm, no murmur Lungs: clear to auscultation bilaterally Abdomen: soft, nontender, nondistended, no obvious ascites, no peritoneal signs, normal bowel sounds, no organomegaly Rectal: Deferred Extremities: no lower extremity edema bilaterally Skin: no lesions on visible extremities Neuro: No focal deficits.   ASSESSMENT:  #1. Change in bowel habits with tendency toward loose mucoid stools as described #2. Incontinence with the inability to distinguish between flatus and stool #3. Weight loss #4. General medical problems   PLAN:  #1. Laboratories today including [facility] in all and celiac screening #2. Clostridium difficile PCR #3. Empiric course of metronidazole 250 mg 4 times a day x10 days #5. Okay to use Imodium sparingly #6. Routine office followup in 4 weeks. If problems persist, she may benefit from diagnostic colonoscopy. #7. Ongoing general medical care with Dr. Vear Clock

## 2013-02-18 ENCOUNTER — Telehealth: Payer: Self-pay | Admitting: Internal Medicine

## 2013-02-18 NOTE — Telephone Encounter (Signed)
Pts daughter wanted to know if there was a lab test done for cancer, let her know there was not a tumor marker drawn. States her mother is still having some diarrhea but she is still taking the meds she was given by Dr. Marina Goodell on Monday. They know to call back if there is no improvement after finishing the medication.

## 2013-02-21 ENCOUNTER — Telehealth: Payer: Self-pay | Admitting: Internal Medicine

## 2013-02-21 NOTE — Telephone Encounter (Signed)
Left message for patient to call back  

## 2013-02-22 ENCOUNTER — Other Ambulatory Visit: Payer: Medicare Other

## 2013-02-22 ENCOUNTER — Other Ambulatory Visit: Payer: Self-pay | Admitting: Internal Medicine

## 2013-02-22 ENCOUNTER — Encounter: Payer: Self-pay | Admitting: Internal Medicine

## 2013-02-22 ENCOUNTER — Ambulatory Visit (INDEPENDENT_AMBULATORY_CARE_PROVIDER_SITE_OTHER): Payer: Medicare Other | Admitting: Internal Medicine

## 2013-02-22 VITALS — BP 142/82 | HR 79 | Ht 60.0 in | Wt 151.0 lb

## 2013-02-22 DIAGNOSIS — E039 Hypothyroidism, unspecified: Secondary | ICD-10-CM

## 2013-02-22 DIAGNOSIS — R634 Abnormal weight loss: Secondary | ICD-10-CM

## 2013-02-22 DIAGNOSIS — R198 Other specified symptoms and signs involving the digestive system and abdomen: Secondary | ICD-10-CM

## 2013-02-22 DIAGNOSIS — J449 Chronic obstructive pulmonary disease, unspecified: Secondary | ICD-10-CM

## 2013-02-22 DIAGNOSIS — R197 Diarrhea, unspecified: Secondary | ICD-10-CM

## 2013-02-22 MED ORDER — TIOTROPIUM BROMIDE MONOHYDRATE 18 MCG IN CAPS: 18.0000 ug | ORAL_CAPSULE | Freq: Every day | RESPIRATORY_TRACT | Status: DC

## 2013-02-22 NOTE — Progress Notes (Signed)
Patient ID: Michelle [name], female    DOB: 10-31-1932, 79 y.o.   MRN: [id number]  HPI 02/20/11-78 yoF never smoker, followed for COPD/ asthma, complicated by Polymyalgia Rheumatica. Last here February 21, 2010. Husband was a long time smoker- so she had significant second hand exposure and has continued also to heat with wood . She ran out of Advair using once daily, when she reached donut hole status last November. Didn't use it at all November and December. Now back at once daily. Notices some modest benefit, but still can't afford the Advair and asks alternatives. Has not had a rescue inhaler. Wheeze most days, sometimes worse than others. Cough scant sputum most mornings.  Blows nose. Denies chest pain. Has had occasional palpitations. Sees Dr Lynnea Ferrier for cardiology. Tolerated right knee replacement last year.   08/22/11- 78 yoF never smoker, followed for COPD/ asthma, complicated by Polymyalgia Rheumatica Spiriva well but was too expensive. She tried Atrovent HFA but says it caused nausea. She is limiting Advair to  Medicare gap cost. She remains aware of shortness of breath with exertion on hills and stairs and says this may be very slowly getting worse without acute change. She notices more clear phlegm coughed up in the mornings. She has long-standing pain in the right anterior chest wall with deep breath. This has been going on for years.  02/23/12- 78 yoF never smoker, followed for COPD/ asthma, complicated by Polymyalgia Rheumatica PCP Dr Leonette Most Phillips/ [place] No significant respiratory infection all winter. Occasional minimal wheeze. Limited more by arthritis done by her breathing. Can't usually afford Advair. Gets assistance with Spiriva which is sufficient.  02/22/13- 80 yoF never smoker, followed for COPD/ asthma, complicated by Polymyalgia Rheumatica PCP Dr Leonette Most Phillips/ [place] FOLLOWS [id number] like breathing was getting worse and forgot to bring pt assistance forms  for CY to sign for Spiriva. She came today, upset because she had just lost her pocketbook. Because of cost we had stopped Advair and she was just using Spiriva occasionally. Chronic right upper anterior chest pain with deep breath has been present for years with no objective findings. Some awareness of dyspnea on exertion, may be slowly getting worse. No acute events, significant cough, wheeze, palpitation or swelling.  Review of Systems-Per HPI Constitutional:   No-   weight loss, night sweats, fevers, chills, fatigue, lassitude. HEENT:   No-  headaches, difficulty swallowing, tooth/dental problems, sore throat,       No-  sneezing, itching, ear ache, nasal congestion, post nasal drip,  CV:  No- chest pain, no- orthopnea, PND, swelling in lower extremities, anasarca,  dizziness, palpitations Resp: +  shortness of breath with exertion or at rest.             No- productive cough,  No non-productive cough,  No-  coughing up of blood.              No-   change in color of mucus.  No- wheezing.   Skin: No-   rash or lesions. GI:  No-   heartburn, indigestion, abdominal pain, nausea, vomiting,  GU:  MS:  No-   joint pain or swelling.  Neuro- nothing unusual Psych:  No- change in mood or affect. No depression or anxiety.  No memory loss.  Objective:   Physical Exam General- Alert, Oriented, Affect-appropriate, Distress- none acute. Talkative without apparent breathlessness Skin- rash-none, lesions- none, excoriation- none Lymphadenopathy- none Head- atraumatic  Eyes- Gross vision intact, PERRLA, conjunctivae clear secretions            Ears- Hearing, canals-normal            Nose- Clear, no-Septal dev, mucus, polyps, erosion, perforation             Throat- Mallampati II , mucosa clear , drainage- none, tonsils- atrophic Neck- flexible , trachea midline, no stridor , thyroid nl, carotid no bruit Chest - symmetrical excursion , unlabored           Heart/CV- RRR , faint systolic Ao  murmur , no gallop  , no rub, nl s1 s2                           - JVD- none , edema- none, stasis changes- none, varices- none           Lung- clear to P&A, but distant.,  wheeze- none, cough- none , dullness-none, rub- none           Chest wall-  Abd-  Br/ Gen/ Rectal- Not done, not indicated Extrem- cyanosis- none, clubbing, none, atrophy- none, strength- nl Neuro- grossly intact to observation

## 2013-02-22 NOTE — Telephone Encounter (Signed)
I have left a message for the patient with Dr. Lamar Sprinkles recommendations.  She is asked to call back for questions.  She requested I leave a message she was going to be out of the house at other appts today.

## 2013-02-22 NOTE — Telephone Encounter (Signed)
Left message for patient to call back  

## 2013-02-22 NOTE — Telephone Encounter (Signed)
Patient reports that she has had 1 week of Flagyl, but is unable to tolerate due to dizziness.  She is still having diarrhea.  She is bringing the stool sample to the lab today for the stool studies.  Do you advise an alternate treatment or wait for the results of the stool studies.  Dr. Marina Goodell please advise

## 2013-02-22 NOTE — Telephone Encounter (Signed)
Stop flagyl. Wait on stool studies

## 2013-02-22 NOTE — Patient Instructions (Addendum)
Sample Spiriva and assistance forms   1 daily  Order- CXR  Dx Asthma with COPD  Please call as needed

## 2013-02-24 LAB — GASTROINTESTINAL PATHOGEN PANEL PCR
Giardia lamblia, PCR: NEGATIVE
Norovirus, PCR: NEGATIVE
Rotavirus A, PCR: NEGATIVE
Salmonella, PCR: POSITIVE — CR
Shigella, PCR: NEGATIVE

## 2013-02-28 NOTE — Assessment & Plan Note (Signed)
Stable COPD with mild obstructive airways disease and an asthma component. We discussed finances, appropriate level medication and cost. Plan-Spiriva sample and help with assistance form. Chest x-ray.

## 2013-03-21 ENCOUNTER — Ambulatory Visit (INDEPENDENT_AMBULATORY_CARE_PROVIDER_SITE_OTHER): Payer: Medicare Other | Admitting: Internal Medicine

## 2013-03-21 ENCOUNTER — Encounter: Payer: Self-pay | Admitting: Internal Medicine

## 2013-03-21 VITALS — BP 124/52 | HR 58 | Ht 61.0 in | Wt 150.1 lb

## 2013-03-21 DIAGNOSIS — E1059 Type 1 diabetes mellitus with other circulatory complications: Secondary | ICD-10-CM

## 2013-03-21 DIAGNOSIS — R198 Other specified symptoms and signs involving the digestive system and abdomen: Secondary | ICD-10-CM

## 2013-03-21 DIAGNOSIS — R197 Diarrhea, unspecified: Secondary | ICD-10-CM

## 2013-03-21 MED ORDER — MOVIPREP 100 G PO SOLR: 1.0000 | Freq: Once | ORAL | Status: DC

## 2013-03-21 NOTE — Patient Instructions (Addendum)
You have been scheduled for a colonoscopy with propofol. Please follow written instructions given to you at your visit today.  Please pick up your prep kit at the pharmacy within the next 1-3 days. If you use inhalers (even only as needed), please bring them with you on the day of your procedure.  

## 2013-03-21 NOTE — Progress Notes (Signed)
HISTORY OF PRESENT ILLNESS:  Michelle [name] is a 79 y.o. female with asthma, arthritis, insulin requiring diabetes mellitus, COPD, and GERD. She was evaluated as a new patient 02/14/2013 regarding change in bowel habits and incontinence. See that dictation for details. She underwent laboratories including celiac testing. This was normal. She underwent enteric pathogen panel. No evidence for Clostridium difficile. Salmonella PCR was positive. All other pathogens negative. She was treated empirically with metronidazole 250 mg 4 times a day x10 days. She took this for about 8 or 9 days but subsequently stopped citing dizziness. She's not sure this helped. She has been using Imodium every other day. She does think that this helps. Still having problems, though not quite as severe. No new issues. She has never had colonoscopy.  REVIEW OF SYSTEMS:  All non-GI ROS negative   Past Medical History  Diagnosis Date  . Type II or unspecified type diabetes mellitus without mention of complication, not stated as uncontrolled   . Other and unspecified hyperlipidemia   . Asthma   . Arthritis   . Gallstones   . COPD (chronic obstructive pulmonary disease)   . Interstitial cystitis   . UTI (lower urinary tract infection)     Past Surgical History  Procedure Laterality Date  . Lumbar disc surgery    . Total abdominal hysterectomy    . Rotator cuff repair    . Tonsillectomy    . Cervical spine surgery    . Cholecystectomy    . Hernia repair      Social History Michelle [name]  reports that she has never smoked. She has never used smokeless tobacco. She reports that she does not drink alcohol or use illicit drugs.  family history includes Breast cancer in her mother; Heart disease in her father; and Lung cancer in her brother.  No Known Allergies     PHYSICAL EXAMINATION: Vital signs: BP 124/52  Pulse 58  Ht 5\' 1"  (1.549 m)  Wt 150 lb 2 oz (68.096 kg)  BMI 28.38 kg/m2 General: Well-developed,  well-nourished, no acute distress Abdomen: Not reexamined. Psychiatric: alert and oriented x3. Cooperative   ASSESSMENT:  #1. Change in bowel habits with loose mucoid stools and incontinence. Ongoing #2. Multiple medical problems   PLAN:  #1. We discussed multiple options including other empiric therapies, flexible sigmoidoscopy, or colonoscopy. Since she has never had colonoscopy, we will proceed with colonoscopy with biopsies to rule out entities such as microscopic colitis. Moving forward, we can better direct her treatment. In the interim, she could increase Imodium to once daily and see if that is more helpful. The patient is high-risk given her comorbidities.The nature of the procedure, as well as the risks, benefits, and alternatives were carefully and thoroughly reviewed with the patient. Ample time for discussion and questions allowed. The patient understood, was satisfied, and agreed to proceed. #2. Movi prep prescribed. The patient instructed on his use #3. Hold a.m. insulin the day of the examination to avoid wanted hypoglycemia

## 2013-03-22 ENCOUNTER — Telehealth: Payer: Self-pay | Admitting: Internal Medicine

## 2013-03-22 NOTE — Telephone Encounter (Signed)
LM on VM asking if she wanted me to leave a moviprep sample up front.

## 2013-04-05 ENCOUNTER — Encounter: Payer: Medicare Other | Admitting: Internal Medicine

## 2013-04-07 ENCOUNTER — Encounter: Payer: Self-pay | Admitting: Internal Medicine

## 2013-04-07 ENCOUNTER — Other Ambulatory Visit: Payer: Self-pay | Admitting: Internal Medicine

## 2013-04-07 ENCOUNTER — Ambulatory Visit (AMBULATORY_SURGERY_CENTER): Payer: Medicare Other | Admitting: Internal Medicine

## 2013-04-07 VITALS — BP 199/74 | HR 62 | Temp 97.9°F | Resp 16 | Ht 61.0 in | Wt 150.0 lb

## 2013-04-07 DIAGNOSIS — R197 Diarrhea, unspecified: Secondary | ICD-10-CM

## 2013-04-07 DIAGNOSIS — R198 Other specified symptoms and signs involving the digestive system and abdomen: Secondary | ICD-10-CM

## 2013-04-07 LAB — GLUCOSE, CAPILLARY: Glucose-Capillary: 179 mg/dL — ABNORMAL HIGH (ref 70–99)

## 2013-04-07 MED ORDER — SODIUM CHLORIDE 0.9 % IV SOLN: 500.0000 mL | INTRAVENOUS | Status: DC

## 2013-04-07 NOTE — Op Note (Signed)
[facility] [address] [place], [address]   COLONOSCOPY PROCEDURE REPORT  PATIENT: Michelle [name], Michelle [name]  MR#: [id number] BIRTHDATE: May 15, 1932 , 80  yrs. old GENDER: Female ENDOSCOPIST: Roxy Cedar, MD REFERRED [id number] Vear Clock, M.D. PROCEDURE DATE:  04/07/2013 PROCEDURE:   Colonoscopy with biopsy First Screening Colonoscopy? (Avg.  risk and 50 yrs.  old or older) No.  Prior Negative Screening? (Now for repeat screening, >10 yrs) No.  History of Adenoma? (Now for follow-up colonoscopy, 3 yrs or more) No.  Polyps Removed Today? No.  Recommend repeat exam, <10 yrs? No. ASA CLASS:   Class II INDICATIONS:unexplained diarrhea. MEDICATIONS: MAC sedation, administered by CRNA and propofol (Diprivan) 200mg  IV  DESCRIPTION OF PROCEDURE:   After the risks benefits and alternatives of the procedure were thoroughly explained, informed consent was obtained.  A digital rectal exam revealed no abnormalities of the rectum.   The LB [id number]  endoscope was introduced through the anus and advanced to the cecum, which was identified by both the appendix and ileocecal valve. No adverse events experienced.   The quality of the prep was excellent, using MoviPrep  The instrument was then slowly withdrawn as the colon was fully examined.      COLON FINDINGS: The mucosa appeared normal in the terminal ileum. A normal appearing cecum, ileocecal valve, and appendiceal orifice were identified.  The ascending, hepatic flexure, transverse, splenic flexure, descending, sigmoid colon and rectum appeared unremarkable.  No polyps or cancers were seen.  Retroflexed views revealed no abnormalities. The time to cecum=4 minutes 08 seconds. Withdrawal time=11 minutes 35 seconds.  The scope was withdrawn and the procedure completed. COMPLICATIONS: There were no complications.  ENDOSCOPIC IMPRESSION: 1.   Normal mucosa in the terminal ileum 2.   Normal  colon  RECOMMENDATIONS: 1.  Await biopsy results 2.  OK to try imodium for loose stools 3.  Call office for follow-up appointment in   eSigned:  Roxy Cedar, MD 04/07/2013 9:29 AM   cc: Loma Sender, MD and The Patient   PATIENT NAME:  Michelle [name], Michelle [name] MR#: [id number]

## 2013-04-07 NOTE — Progress Notes (Signed)
Called to room to assist during endoscopic procedure.  Patient ID and intended procedure confirmed with present staff. Received instructions for my participation in the procedure from the performing physician.  

## 2013-04-07 NOTE — Patient Instructions (Signed)
YOU HAD AN ENDOSCOPIC PROCEDURE TODAY AT THE [facility]: Refer to the procedure report that was given to you for any specific questions about what was found during the examination.  If the procedure report does not answer your questions, please call your gastroenterologist to clarify.  If you requested that your care partner not be given the details of your procedure findings, then the procedure report has been included in a sealed envelope for you to review at your convenience later.  YOU SHOULD EXPECT: Some feelings of bloating in the abdomen. Passage of more gas than usual.  Walking can help get rid of the air that was put into your GI tract during the procedure and reduce the bloating. If you had a lower endoscopy (such as a colonoscopy or flexible sigmoidoscopy) you may notice spotting of blood in your stool or on the toilet paper. If you underwent a bowel prep for your procedure, then you may not have a normal bowel movement for a few days.  DIET: Your first meal following the procedure should be a light meal and then it is ok to progress to your normal diet.  A half-sandwich or bowl of soup is an example of a good first meal.  Heavy or fried foods are harder to digest and may make you feel nauseous or bloated.  Likewise meals heavy in dairy and vegetables can cause extra gas to form and this can also increase the bloating.  Drink plenty of fluids but you should avoid alcoholic beverages for 24 hours.  ACTIVITY: Your care partner should take you home directly after the procedure.  You should plan to take it easy, moving slowly for the rest of the day.  You can resume normal activity the day after the procedure however you should NOT DRIVE or use heavy machinery for 24 hours (because of the sedation medicines used during the test).    SYMPTOMS TO REPORT IMMEDIATELY: A gastroenterologist can be reached at any hour.  During normal business hours, 8:30 AM to 5:00 PM Monday through Friday,  call [phone].  After hours and on weekends, please call the GI answering service at [phone] who will take a message and have the physician on call contact you.   Following lower endoscopy (colonoscopy or flexible sigmoidoscopy):  Excessive amounts of blood in the stool  Significant tenderness or worsening of abdominal pains  Swelling of the abdomen that is new, acute  Fever of 100F or higher    FOLLOW UP: If any biopsies were taken you will be contacted by phone or by letter within the next 1-3 weeks.  Call your gastroenterologist if you have not heard about the biopsies in 3 weeks.  Our staff will call the home number listed on your records the next business day following your procedure to check on you and address any questions or concerns that you may have at that time regarding the information given to you following your procedure. This is a courtesy call and so if there is no answer at the home number and we have not heard from you through the emergency physician on call, we will assume that you have returned to your regular daily activities without incident.  SIGNATURES/CONFIDENTIALITY: You and/or your care partner have signed paperwork which will be entered into your electronic medical record.  These signatures attest to the fact that that the information above on your After Visit Summary has been reviewed and is understood.  Full responsibility of the confidentiality   of this discharge information lies with you and/or your care-partner.     

## 2013-04-07 NOTE — Progress Notes (Signed)
Patient did not experience any of the following events: a burn prior to discharge; a fall within the facility; wrong site/side/patient/procedure/implant event; or a hospital transfer or hospital admission upon discharge from the facility. (G8907) Patient did not have preoperative order for IV antibiotic SSI prophylaxis. (G8918)  

## 2013-04-08 ENCOUNTER — Telehealth: Payer: Self-pay | Admitting: *Deleted

## 2013-04-08 NOTE — Telephone Encounter (Signed)
Left message on number given in admitting to return call if problems or questions. ewm 

## 2013-04-11 ENCOUNTER — Telehealth: Payer: Self-pay | Admitting: Cardiovascular Disease

## 2013-04-11 NOTE — Telephone Encounter (Signed)
Returned call.  Left message to call tomorrow back before 4pm.

## 2013-04-12 ENCOUNTER — Encounter: Payer: Self-pay | Admitting: Internal Medicine

## 2013-05-05 ENCOUNTER — Telehealth: Payer: Self-pay | Admitting: Neurology

## 2013-05-09 ENCOUNTER — Ambulatory Visit: Payer: Medicare Other | Admitting: Internal Medicine

## 2013-05-16 ENCOUNTER — Encounter: Payer: Self-pay | Admitting: Internal Medicine

## 2013-05-16 ENCOUNTER — Ambulatory Visit (INDEPENDENT_AMBULATORY_CARE_PROVIDER_SITE_OTHER): Payer: Medicare Other | Admitting: Internal Medicine

## 2013-05-16 VITALS — BP 140/60 | HR 52 | Ht 60.24 in | Wt 148.2 lb

## 2013-05-16 DIAGNOSIS — R159 Full incontinence of feces: Secondary | ICD-10-CM

## 2013-05-16 DIAGNOSIS — R198 Other specified symptoms and signs involving the digestive system and abdomen: Secondary | ICD-10-CM

## 2013-05-16 NOTE — Progress Notes (Signed)
HISTORY OF PRESENT ILLNESS:  Michelle [name] is a 79 y.o. female with multiple medical problems as listed below. She was evaluated as a new patient 02/14/2013 regarding change in bowel habits and incontinence. She was subsequently seen in followup may 12th 2014. See the dictations for details. Testing was unrevealing and empiric therapies unhelpful. Thus, on 04/07/2013 she underwent complete colonoscopy with intubation of the terminal ileum. The examination was entirely normal. Random biopsies of colon were unremarkable. She was treated with Imodium and for followup at this time. She does tell me that Imodium helps significantly, though she still has mucus per rectum. She does wear protective pad. No abdominal pain or other issues. She has one bowel movement per day  REVIEW OF SYSTEMS:  All non-GI ROS negative except for arthritis, back pain, fatigue, muscle cramps, ankle swelling, shortness of breath  Past Medical History  Diagnosis Date  . Type II or unspecified type diabetes mellitus without mention of complication, not stated as uncontrolled   . Other and unspecified hyperlipidemia   . Asthma   . Arthritis   . Gallstones   . COPD (chronic obstructive pulmonary disease)   . Interstitial cystitis   . UTI (lower urinary tract infection)   . Malaise and fatigue     Past Surgical History  Procedure Laterality Date  . Lumbar disc surgery    . Total abdominal hysterectomy    . Rotator cuff repair    . Tonsillectomy    . Cervical spine surgery    . Cholecystectomy    . Hernia repair      Social History Michelle [name]  reports that she has never smoked. She has never used smokeless tobacco. She reports that she does not drink alcohol or use illicit drugs.  family history includes Breast cancer in her mother; Heart disease in her father; and Lung cancer in her brother.  No Known Allergies     PHYSICAL EXAMINATION: Vital signs: BP 140/60  Pulse 52  Ht 5' 0.24" (1.53 m)  Wt 148 lb  4 oz (67.246 kg)  BMI 28.73 kg/m2 General: Well-developed, well-nourished, no acute distress Abdomen: Not reexamined. Psychiatric: alert and oriented x3. Cooperative  ASSESSMENT:  #1. Change in bowel habits with incontinence. Negative workup. Significant improvement, though incomplete, with Imodium.   PLAN:  #1. Continue with protective undergarments #2. Imodium as needed #3. GI followup as needed

## 2013-05-16 NOTE — Patient Instructions (Addendum)
Please follow up with Dr. Perry as needed 

## 2013-05-23 ENCOUNTER — Ambulatory Visit (INDEPENDENT_AMBULATORY_CARE_PROVIDER_SITE_OTHER): Payer: Medicare Other | Admitting: Nurse Practitioner

## 2013-05-23 ENCOUNTER — Encounter: Payer: Self-pay | Admitting: Nurse Practitioner

## 2013-05-23 VITALS — BP 132/90 | HR 58 | Ht 61.5 in | Wt 151.0 lb

## 2013-05-23 DIAGNOSIS — R413 Other amnesia: Secondary | ICD-10-CM | POA: Insufficient documentation

## 2013-05-23 DIAGNOSIS — E785 Hyperlipidemia, unspecified: Secondary | ICD-10-CM

## 2013-05-23 NOTE — Progress Notes (Signed)
HPI: Patient returns for followup after her last visit 11/22/2012. She has a history of mild memory trouble.  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, retired since, she lives at her home by herself, still driving, watching TV, News, soap opera, she has 4 children, visit her frequently, they play card game regularly, she was noticed by her daughter to make frequent, simple mistakes, she also tends to repeat herself, misplace things,  She has been taking low-dose prednisone 5 mg every day for polymyalgia rheumatica, she is also self administrated her insulin without difficulty, gait difficulty due to bilateral knee pain, MRI scan of the brain  moderate degree of global cerebral atrophy and mild degree of changes of chronic microvascular ischemia. She is doing well, still lives alone, with daughters check on her often. UPDATE 04/30/2012: She is accompanied by two daughters at today's visit,I reviewed MRI results with them again, there is evidence of atrophy, but she is still functional at home, mild cognitive impairment is a better term to describe her current condition, she may stay in current functional status for extended period of time, or current stage might also indicate an early stage of dementia, her maternal aunt suffered dementia her mother died of breast cancer at age 80, her father died of coronary artery disease at age 70.She also reported a history of bradycardia, is followed by cardiologist, there was a period of time, pacemaker placement was considered, she is tolerating Aricept 5 mg, today's heart rate is 50s, after discussed with patient, will decided to stop Aricept, will try Namenda, titrating to 10 mg twice a  day, potential side effect was explained to her  05/23/13  [name], 79 yr old returns for followup.  MMSE stable at 30/30. Continues to drive without difficulty. Remains independent in ADL's. Heart rate remains in the 50's. Can only tolerate Namenda 10mg  daily not BID, due to dizziness. Made aware the 10mg  tab is no longer going to be available. No new neurologic complaints. She feels her memory is about the same.     ROS:  Shortness of breath, easy bruising, knee pain, memory loss, dizziness, decreased energy  Physical Exam General: well developed, well nourished, seated, in no evident distress Head: head normocephalic and atraumatic. Oropharynx benign Neck: supple with no carotid  bruits Cardiovascular: regular rate and rhythm, no murmurs  Neurologic Exam Mental Status: Awake and fully alert. MMSE 30/30. AFT 9. Oriented to place and time. Follows all commands. Speech and language normal.   Cranial Nerves:  Pupils equal, briskly reactive to light. Extraocular movements full without nystagmus. Visual fields full to confrontation. Hearing intact and symmetric to finger snap. Facial sensation intact. Face, tongue, palate move normally and symmetrically. Neck flexion and extension normal.  Motor: Normal bulk and tone. Normal strength in all tested extremity muscles.No focal weakness Coordination: Rapid alternating movements normal in all extremities. Finger-to-nose and heel-to-shin performed accurately bilaterally. Gait and Station: Arises from chair without difficulty. Antalgic gait due to knee pain. No assistive device  Reflexes: 2+ and symmetric upper extremities absent in the lower extremities. Toes downgoing.     ASSESSMENT: 2 year history of gradual onset memory troubles remains highly functional.  Mini-Mental Status exam is stable 30 out of 30 Vascular risk  factors of long-standing insulin-dependent diabetes, hypertension, hyperlipidemia and hypothyroidism   PLAN: Continue Namenda 10  mg at bedtime May have to switch to Namenda 14 mg extended release since patient has not been able to tolerate higher doses and the 10 mg will be going off the market shortly Fall risk=7, be careful with ambulation Followup in 6 months  Nilda Riggs, GNP-BC APRN

## 2013-05-23 NOTE — Patient Instructions (Addendum)
Continue Namenda 10 mg at bedtime May have to switch to Namenda 14 mg extended release since patient has not been able to tolerate higher doses and the 10 mg will be going off the market shortly Followup in 6 months Mini-Mental Status score is stable

## 2013-05-25 ENCOUNTER — Telehealth: Payer: Self-pay | Admitting: Neurology

## 2013-05-25 NOTE — Telephone Encounter (Signed)
I called pt and she is needing I think diagnosis codes for the B12 she had done (per her pcp, Dr. Vear Clock, [phone]).  I will call and assist then with this.  780.93 and V58.69  I called and they had VM.  (off this afternoon).  Will call in am.

## 2013-05-27 NOTE — Telephone Encounter (Signed)
Tried Wednesday and also Today.  Not able to LM.

## 2013-06-02 NOTE — Telephone Encounter (Signed)
I called and spoke to Dr. Vear Clock office.   Receptionist stated that pt had spoken to her about needing B12 injections.  I told her that pt spoke to me about needing diagnosis codes for insurance to pay for B12 lab test.   Some confusion about what it needed.   I called pt and relayed the conversation and recommended that she call there office now to speak to them.   Pt stated she would.

## 2013-06-14 ENCOUNTER — Other Ambulatory Visit: Payer: Self-pay

## 2013-06-14 DIAGNOSIS — Z1231 Encounter for screening mammogram for malignant neoplasm of breast: Secondary | ICD-10-CM

## 2013-06-15 ENCOUNTER — Other Ambulatory Visit: Payer: Self-pay | Admitting: Neurology

## 2013-06-15 ENCOUNTER — Ambulatory Visit: Payer: Medicare Other

## 2013-06-15 ENCOUNTER — Ambulatory Visit (INDEPENDENT_AMBULATORY_CARE_PROVIDER_SITE_OTHER): Payer: Medicare Other | Admitting: Ophthalmology

## 2013-06-15 DIAGNOSIS — I10 Essential (primary) hypertension: Secondary | ICD-10-CM

## 2013-06-15 DIAGNOSIS — H35039 Hypertensive retinopathy, unspecified eye: Secondary | ICD-10-CM

## 2013-06-15 DIAGNOSIS — H3530 Unspecified macular degeneration: Secondary | ICD-10-CM

## 2013-06-15 DIAGNOSIS — H43819 Vitreous degeneration, unspecified eye: Secondary | ICD-10-CM

## 2013-06-15 DIAGNOSIS — E11319 Type 2 diabetes mellitus with unspecified diabetic retinopathy without macular edema: Secondary | ICD-10-CM

## 2013-06-15 DIAGNOSIS — E1139 Type 2 diabetes mellitus with other diabetic ophthalmic complication: Secondary | ICD-10-CM

## 2013-08-16 ENCOUNTER — Ambulatory Visit
Admission: RE | Admit: 2013-08-16 | Discharge: 2013-08-16 | Disposition: A | Discharge status: Home / Self Care | Payer: Medicare Other | Source: Ambulatory Visit

## 2013-08-16 DIAGNOSIS — Z1231 Encounter for screening mammogram for malignant neoplasm of breast: Secondary | ICD-10-CM

## 2013-11-24 ENCOUNTER — Encounter: Payer: Self-pay | Admitting: Neurology

## 2013-11-24 ENCOUNTER — Ambulatory Visit (INDEPENDENT_AMBULATORY_CARE_PROVIDER_SITE_OTHER): Payer: Medicare Other | Admitting: Neurology

## 2013-11-24 VITALS — BP 195/64 | HR 67 | Ht 61.0 in | Wt 143.0 lb

## 2013-11-24 DIAGNOSIS — M25569 Pain in unspecified knee: Secondary | ICD-10-CM

## 2013-11-24 DIAGNOSIS — E785 Hyperlipidemia, unspecified: Secondary | ICD-10-CM | POA: Diagnosis not present

## 2013-11-24 DIAGNOSIS — R269 Unspecified abnormalities of gait and mobility: Secondary | ICD-10-CM

## 2013-11-24 DIAGNOSIS — J4489 Other specified chronic obstructive pulmonary disease: Secondary | ICD-10-CM

## 2013-11-24 DIAGNOSIS — J449 Chronic obstructive pulmonary disease, unspecified: Secondary | ICD-10-CM

## 2013-11-24 DIAGNOSIS — E119 Type 2 diabetes mellitus without complications: Secondary | ICD-10-CM | POA: Diagnosis not present

## 2013-11-24 DIAGNOSIS — R413 Other amnesia: Secondary | ICD-10-CM | POA: Diagnosis not present

## 2013-11-24 DIAGNOSIS — M25559 Pain in unspecified hip: Secondary | ICD-10-CM

## 2013-11-24 DIAGNOSIS — J45909 Unspecified asthma, uncomplicated: Secondary | ICD-10-CM

## 2013-11-24 MED ORDER — MEMANTINE HCL ER 28 MG PO CP24: 28.0000 mg | ORAL_CAPSULE | Freq: Every day | ORAL | Status: DC

## 2013-11-24 NOTE — Progress Notes (Signed)
HPI: Patient returns for followup for her mild memory trouble.  She has past medical history of diabetes, insulin-dependent, hypertension, hyperlipidemia, polymyalgia rheumatica, she also had a history of right knee replacement Mar 10, 2010.    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  MRI scan of the brain showed moderate degree of global cerebral atrophy and mild degree of changes of chronic microvascular ischemia.   Her maternal aunts suffered dementia,  her mother died of breast cancer at age 7, her father died of coronary artery disease at age 33.  Her paternal uncle, aunt suffered dementia,  She also reported a history of bradycardia, is followed by cardiologist, there was a period of time, pacemaker placement was considered, she is tolerating Aricept 5 mg, today's heart rate is 7s, after discussed with patient, we stopped Aricept, [place] was started since July 2014,   UPDATE Jan 15th 2015; She took Namenda for about 6 months, did not notice any significant difference, She has run out of namenda because of donut hole, she is less active because of bilateral hip and knee pain. She is using Fentanyl patch 65mcg q week, instead of q3 days, it made her too sleepy.  ROS:  Memory loss, bilateral knee and hip pain.   Physical Exam General: well developed, well nourished, seated, in no evident distress Head: head  normocephalic and atraumatic. Oropharynx benign Neck: supple with no carotid  bruits Cardiovascular: regular rate and rhythm, no murmurs  Neurologic Exam Mental Status: Awake and fully alert. MMSE 29/30. AFT 8. Oriented to place and time. Follows all commands. Speech and language normal, missed MMSE 1/3   Cranial Nerves:  Pupils equal, briskly reactive to light. Extraocular movements full without nystagmus. Visual fields full to confrontation. Hearing intact and symmetric to finger snap. Facial sensation intact. Face, tongue, palate move normally and symmetrically. Neck flexion and extension normal.  Motor: Normal bulk and tone. Normal strength in all tested extremity muscles.No focal weakness Coordination: Rapid alternating movements normal in all extremities. Finger-to-nose and heel-to-shin performed accurately bilaterally. Gait and Station: Arises from chair by pushing on chair arm, Antalgic gait due to knee pain. No assistive device  Reflexes: 2+ and symmetric upper extremities absent in the lower extremities. Toes downgoing.   ASSESSMENT:   80 years old Caucasian female, presenting with gradual onset memory troubles since 11-Mar-2011, remains highly functional.  Mini-Mental Status exam is stable 29 out of 30 today.   She has vascular risk factors of long-standing insulin-dependent diabetes, hypertension, hyperlipidemia and hypothyroidism, both maternal, and maternal aunt, uncle suffered dementia  PLAN: mild cognitive impairment  1 I refilled her Namenda xr 28 mg once a day 2. home physical therapy.

## 2013-12-19 DIAGNOSIS — M353 Polymyalgia rheumatica: Secondary | ICD-10-CM | POA: Diagnosis not present

## 2013-12-19 DIAGNOSIS — M810 Age-related osteoporosis without current pathological fracture: Secondary | ICD-10-CM | POA: Diagnosis not present

## 2013-12-19 DIAGNOSIS — M1990 Unspecified osteoarthritis, unspecified site: Secondary | ICD-10-CM | POA: Diagnosis not present

## 2013-12-22 DIAGNOSIS — I10 Essential (primary) hypertension: Secondary | ICD-10-CM | POA: Diagnosis not present

## 2014-01-13 ENCOUNTER — Encounter: Payer: Self-pay | Admitting: *Deleted

## 2014-01-16 ENCOUNTER — Ambulatory Visit (INDEPENDENT_AMBULATORY_CARE_PROVIDER_SITE_OTHER): Payer: Medicare Other | Admitting: Cardiovascular Disease

## 2014-01-16 ENCOUNTER — Encounter: Payer: Self-pay | Admitting: Cardiovascular Disease

## 2014-01-16 VITALS — BP 122/82 | HR 50 | Ht 62.0 in | Wt 143.0 lb

## 2014-01-16 DIAGNOSIS — Z79899 Other long term (current) drug therapy: Secondary | ICD-10-CM

## 2014-01-16 DIAGNOSIS — R5383 Other fatigue: Secondary | ICD-10-CM

## 2014-01-16 DIAGNOSIS — D689 Coagulation defect, unspecified: Secondary | ICD-10-CM

## 2014-01-16 DIAGNOSIS — R001 Bradycardia, unspecified: Secondary | ICD-10-CM

## 2014-01-16 DIAGNOSIS — R0789 Other chest pain: Secondary | ICD-10-CM | POA: Diagnosis not present

## 2014-01-16 DIAGNOSIS — I498 Other specified cardiac arrhythmias: Secondary | ICD-10-CM

## 2014-01-16 DIAGNOSIS — R5381 Other malaise: Secondary | ICD-10-CM

## 2014-01-16 NOTE — Patient Instructions (Signed)
Your physician has recommended that you have a pacemaker inserted ([place]) A pacemaker is a small device that is placed under the skin of your chest or abdomen to help control abnormal heart rhythms. This device uses electrical pulses to prompt the heart to beat at a normal rate. Pacemakers are used to treat heart rhythms that are too slow. Wire (leads) are attached to the pacemaker that goes into the chambers of you heart. This is done in the hospital and usually requires and overnight stay. Please see the instruction sheet given to you today for more information.  Your physician recommends that you return for lab work in:  [place].

## 2014-01-16 NOTE — Assessment & Plan Note (Signed)
Michelle [name] has clear evidence of sinus node dysfunction and has now developed symptoms that suggest she would benefit from pacemaker therapy. This would also subsequently allow treatment with cholinesterase inhibitors for her memory problems. I have recommended that she undergo a dual-chamber permanent pacemaker implantation. We discussed the procedure, possible complications and long-term followup in detail. She will talk it over with her daughters before making a decision.

## 2014-01-16 NOTE — Progress Notes (Signed)
Patient ID: Michelle [name], female   DOB: 1932/08/25, 80 y.o.   MRN: [id number]     Reason for office visit Michelle [name] returns for routine followup, but now appears to have worsening symptoms of bradycardia. Last year she was referred for significant bradycardia at rest, but her Holter monitor showed normal heart rate response to activity and so the decision was made not to pursue a pacemaker. She has now developed recurrent episodes of dizziness and lightheadedness with near syncope. When she checks her heart rate these episodes are associated with rates of 30-40 beats per minute. She has been unable to take medications for dementia because of the bradycardia. She denies significant shortness of breath (has mild shortness of breath that she attributes to COPD) or angina but does have fatigue. She has limitation in her ability to be physically active because of arthralgias in her left knee, bilateral hips and lumbar spine. She continues to live independently.   No Known Allergies  Current Outpatient Prescriptions  Medication Sig Dispense Refill  . fentaNYL (DURAGESIC - DOSED MCG/HR) 50 MCG/HR Apply 1 patch every week      . HYDROcodone-acetaminophen (VICODIN) 5-500 MG per tablet Take 325 tablets by mouth. Take 1/2 tablet by mouth daily up to twice daily if needed      . insulin lispro (HUMALOG) 100 UNIT/ML injection Inject into the skin as directed. prn      . insulin NPH (HUMULIN N,NOVOLIN N) 100 UNIT/ML injection Inject 42 Units into the skin as directed.       Marland Kitchen levothyroxine (SYNTHROID, LEVOTHROID) 88 MCG tablet Take 88 mcg by mouth daily.        Marland Kitchen LIVALO 4 MG TABS 3 x a week      . Multiple Vitamin (MULTIVITAMIN) tablet Take 1 tablet by mouth daily.        . predniSONE (DELTASONE) 5 MG tablet Take 5 mg by mouth daily.        Marland Kitchen tiotropium (SPIRIVA HANDIHALER) 18 MCG inhalation capsule Place 1 capsule (18 mcg total) into inhaler and inhale daily.  90 capsule  3   No current  facility-administered medications for this visit.    Past Medical History  Diagnosis Date  . Type II or unspecified type diabetes mellitus without mention of complication, not stated as uncontrolled   . Dyslipidemia   . Asthma   . Arthritis   . Gallstones   . COPD (chronic obstructive pulmonary disease)   . Interstitial cystitis   . UTI (lower urinary tract infection)   . Malaise and fatigue   . Memory loss   . Coronary atherosclerosis   . Systemic hypertension   . Polymyalgia rheumatica     Past Surgical History  Procedure Laterality Date  . Lumbar disc surgery    . Total abdominal hysterectomy    . Rotator cuff repair    . Tonsillectomy    . Cervical spine surgery    . Cholecystectomy    . Hernia repair    . Cardiac catheterization  01/29/2006    10% luminal irregularity mid LAD  . Cardiac catheterization  03/15/2001    No significant CAD, no evidence of renal artery stenosis, systemic hypertenion  . US echocardiography  09/14/2007    mild LVH, LA mildly dilated,mild mitral annular calcification, AOV mildly sclerotic, aortic root sclerosis/ca+  . Nm myocar perf wall motion  08/17/2009    normal    Family History  Problem Relation Age of Onset  . Breast  cancer Mother   . Heart disease Father   . Lung cancer Brother     History   Social History  . Marital Status: Widowed    Spouse Name: N/A    Number of Children: 4  . Years of Education: 12   Occupational History  . retired    Social History Main Topics  . Smoking status: Never Smoker   . Smokeless tobacco: Never Used  . Alcohol Use: No  . Drug Use: No  . Sexual Activity: Not on file   Other Topics Concern  . Not on file   Social History Narrative   Patient lives at home alone. Widowed.   Retired.   Education High school   Right handed   Caffeine- some times coffee and soda one cup.    Review of systems: The patient specifically denies any chest pain at rest or with exertion, dyspnea at rest or  with exertion, orthopnea, paroxysmal nocturnal dyspnea, palpitations, focal neurological deficits, intermittent claudication, lower extremity edema, unexplained weight gain, cough, hemoptysis or wheezing.  The patient also denies abdominal pain, nausea, vomiting, dysphagia, diarrhea, constipation, polyuria, polydipsia, dysuria, hematuria, frequency, urgency, abnormal bleeding or bruising, fever, chills, unexpected weight changes, mood swings, change in skin or hair texture, change in voice quality, auditory or visual problems, allergic reactions or rashes, new musculoskeletal complaints other than usual "aches and pains".   PHYSICAL EXAM BP 122/82  Pulse 50  Ht 5' 2" (1.575 m)  Wt 64.864 kg (143 lb)  BMI 26.15 kg/m2  General: Alert, oriented x3, no distress Head: no evidence of trauma, PERRL, EOMI, no exophtalmos or lid lag, no myxedema, no xanthelasma; normal ears, nose and oropharynx Neck: normal jugular venous pulsations and no hepatojugular reflux; brisk carotid pulses without delay and no carotid bruits Chest: clear to auscultation, no signs of consolidation by percussion or palpation, normal fremitus, symmetrical and full respiratory excursions Cardiovascular: normal position and quality of the apical impulse, regular rhythm, normal first and second heart sounds, no murmurs, rubs or gallops Abdomen: no tenderness or distention, no masses by palpation, no abnormal pulsatility or arterial bruits, normal bowel sounds, no hepatosplenomegaly Extremities: no clubbing, cyanosis or edema; 2+ radial, ulnar and brachial pulses bilaterally; 2+ right femoral, posterior tibial and dorsalis pedis pulses; 2+ left femoral, posterior tibial and dorsalis pedis pulses; no subclavian or femoral bruits Neurological: grossly nonfocal   EKG: Sinus bradycardia, left axis deviation with borderline criteria for left anterior fascicular block  Lipid Panel  No results found for this basename: chol, trig, hdl,  cholhdl, vldl, ldlcalc    BMET    Component Value Date/Time   NA 135 02/14/2013 1151   K 3.7 02/14/2013 1151   CL 97 02/14/2013 1151   CO2 26 02/14/2013 1151   GLUCOSE 351* 02/14/2013 1151   BUN 22 02/14/2013 1151   CREATININE 1.0 02/14/2013 1151   CALCIUM 8.8 02/14/2013 1151   GFRNONAA 54* 03/27/2010 0400   GFRAA  Value: >60        The eGFR has been calculated using the MDRD equation. This calculation has not been validated in all clinical situations. eGFR's persistently <60 mL/min signify possible Chronic Kidney Disease. 03/27/2010 0400     ASSESSMENT AND PLAN Symptomatic sinus bradycardia Michelle [name] has clear evidence of sinus node dysfunction and has now developed symptoms that suggest she would benefit from pacemaker therapy. This would also subsequently allow treatment with cholinesterase inhibitors for her memory problems. I have recommended that she undergo a   dual-chamber permanent pacemaker implantation. We discussed the procedure, possible complications and long-term followup in detail. She will talk it over with her daughters before making a decision.   Orders Placed This Encounter  Procedures  . Comprehensive metabolic panel  . APTT  . Protime-INR  . CBC  . EKG 12-Lead  . PERMANENT PACEMAKER INSERTION   No orders of the defined types were placed in this encounter.    Holli Humbles, MD, [place] [phone] office [phone] pager

## 2014-01-26 DIAGNOSIS — M5126 Other intervertebral disc displacement, lumbar region: Secondary | ICD-10-CM | POA: Diagnosis not present

## 2014-01-31 DIAGNOSIS — M5126 Other intervertebral disc displacement, lumbar region: Secondary | ICD-10-CM | POA: Diagnosis not present

## 2014-01-31 DIAGNOSIS — M48061 Spinal stenosis, lumbar region without neurogenic claudication: Secondary | ICD-10-CM | POA: Diagnosis not present

## 2014-02-07 ENCOUNTER — Other Ambulatory Visit: Payer: Self-pay | Admitting: Neurosurgery

## 2014-02-07 DIAGNOSIS — M5430 Sciatica, unspecified side: Secondary | ICD-10-CM

## 2014-02-07 DIAGNOSIS — Z6825 Body mass index (BMI) 25.0-25.9, adult: Secondary | ICD-10-CM | POA: Diagnosis not present

## 2014-02-07 DIAGNOSIS — I10 Essential (primary) hypertension: Secondary | ICD-10-CM | POA: Diagnosis not present

## 2014-02-07 DIAGNOSIS — M545 Low back pain, unspecified: Secondary | ICD-10-CM

## 2014-02-07 DIAGNOSIS — M5126 Other intervertebral disc displacement, lumbar region: Secondary | ICD-10-CM | POA: Diagnosis not present

## 2014-02-10 DIAGNOSIS — I10 Essential (primary) hypertension: Secondary | ICD-10-CM | POA: Diagnosis not present

## 2014-02-15 ENCOUNTER — Ambulatory Visit
Admission: RE | Admit: 2014-02-15 | Discharge: 2014-02-15 | Disposition: A | Discharge status: Home / Self Care | Payer: Medicare Other | Source: Ambulatory Visit | Attending: Neurosurgery | Admitting: Neurosurgery

## 2014-02-15 DIAGNOSIS — IMO0002 Reserved for concepts with insufficient information to code with codable children: Secondary | ICD-10-CM | POA: Diagnosis not present

## 2014-02-15 DIAGNOSIS — M5430 Sciatica, unspecified side: Secondary | ICD-10-CM

## 2014-02-15 DIAGNOSIS — M545 Low back pain, unspecified: Secondary | ICD-10-CM

## 2014-02-15 MED ORDER — IOHEXOL 180 MG/ML  SOLN
1.0000 mL | Freq: Once | INTRAMUSCULAR | Status: AC | PRN
  Administered 2014-02-15: 1 mL via EPIDURAL

## 2014-02-15 MED ORDER — METHYLPREDNISOLONE ACETATE 40 MG/ML INJ SUSP (RADIOLOG
120.0000 mg | Freq: Once | INTRAMUSCULAR | Status: AC
  Administered 2014-02-15: 120 mg via EPIDURAL

## 2014-02-15 NOTE — Discharge Instructions (Signed)

## 2014-02-21 ENCOUNTER — Ambulatory Visit (INDEPENDENT_AMBULATORY_CARE_PROVIDER_SITE_OTHER): Payer: Medicare Other | Admitting: Internal Medicine

## 2014-02-21 ENCOUNTER — Ambulatory Visit (INDEPENDENT_AMBULATORY_CARE_PROVIDER_SITE_OTHER)
Admission: RE | Admit: 2014-02-21 | Discharge: 2014-02-21 | Disposition: A | Discharge status: Home / Self Care | Payer: Medicare Other | Source: Ambulatory Visit | Attending: Internal Medicine | Admitting: Internal Medicine

## 2014-02-21 ENCOUNTER — Encounter: Payer: Self-pay | Admitting: Internal Medicine

## 2014-02-21 VITALS — BP 136/80 | HR 60 | Ht 60.0 in | Wt 146.4 lb

## 2014-02-21 DIAGNOSIS — J449 Chronic obstructive pulmonary disease, unspecified: Secondary | ICD-10-CM | POA: Diagnosis not present

## 2014-02-21 DIAGNOSIS — I498 Other specified cardiac arrhythmias: Secondary | ICD-10-CM

## 2014-02-21 DIAGNOSIS — R001 Bradycardia, unspecified: Secondary | ICD-10-CM

## 2014-02-21 NOTE — Progress Notes (Signed)
Patient ID: Michelle [name], female    DOB: 10-01-1932, 80 y.o.   MRN: [id number]  HPI 02/20/11-78 yoF never smoker, followed for COPD/ asthma, complicated by Polymyalgia Rheumatica. Last here February 21, 2010. Husband was a long time smoker- so she had significant second hand exposure and has continued also to heat with wood . She ran out of Advair using once daily, when she reached donut hole status last November. Didn't use it at all November and December. Now back at once daily. Notices some modest benefit, but still can't afford the Advair and asks alternatives. Has not had a rescue inhaler. Wheeze most days, sometimes worse than others. Cough scant sputum most mornings.  Blows nose. Denies chest pain. Has had occasional palpitations. Sees Dr Elisabeth Cara for cardiology. Tolerated right knee replacement last year.   08/22/11- 78 yoF never smoker, followed for COPD/ asthma, complicated by Polymyalgia Rheumatica Spiriva well but was too expensive. She tried Atrovent HFA but says it caused nausea. She is limiting Advair to  Medicare gap cost. She remains aware of shortness of breath with exertion on hills and stairs and says this may be very slowly getting worse without acute change. She notices more clear phlegm coughed up in the mornings. She has long-standing pain in the right anterior chest wall with deep breath. This has been going on for years.  02/23/12- 78 yoF never smoker, followed for COPD/ asthma, complicated by Polymyalgia Rheumatica PCP Dr Juanda Crumble Phillips/ [place] No significant respiratory infection all winter. Occasional minimal wheeze. Limited more by arthritis done by her breathing. Can't usually afford Advair. Gets assistance with Spiriva which is sufficient.  02/22/13- 80 yoF never smoker, followed for COPD/ asthma, complicated by Polymyalgia Rheumatica PCP Dr Juanda Crumble Phillips/ [place] FOLLOWS [id number] like breathing was getting worse and forgot to bring pt assistance forms  for [place] to sign for Spiriva. She came today, upset because she had just lost her pocketbook. Because of cost we had stopped Advair and she was just using Spiriva occasionally. Chronic right upper anterior chest pain with deep breath has been present for years with no objective findings. Some awareness of dyspnea on exertion, may be slowly getting worse. No acute events, significant cough, wheeze, palpitation or swelling.  02/21/14- 81 yoF never smoker, followed for COPD/ asthma, complicated by Polymyalgia Rheumatica PCP Dr Juanda Crumble Phillips/ [place] FOLLOWS FOR: Had episode of hard to breathe but has gotten better-back to were she used to be. Distracted by her back problems. Episode of dyspnea was helped with Spiriva. She is considering surgery for joints and pacemaker.  Review of Systems-Per HPI Constitutional:   No-   weight loss, night sweats, fevers, chills, fatigue, lassitude. HEENT:   No-  headaches, difficulty swallowing, tooth/dental problems, sore throat,       No-  sneezing, itching, ear ache, nasal congestion, post nasal drip,  CV:  No- chest pain, no- orthopnea, PND, swelling in lower extremities, anasarca,  dizziness, palpitations Resp: +  shortness of breath with exertion or at rest.             No- productive cough,  No non-productive cough,  No-  coughing up of blood.              No-   change in color of mucus.  No- wheezing.   Skin: No-   rash or lesions. GI:  No-   heartburn, indigestion, abdominal pain, nausea, vomiting,  GU:  MS:  No-   joint pain or  swelling.  Neuro- nothing unusual Psych:  No- change in mood or affect. No depression or anxiety.  No memory loss.  Objective:   Physical Exam General- Alert, Oriented, Affect-appropriate, Distress- none acute. Talkative without apparent                        breathlessness Skin- rash-none, lesions- none, excoriation- none Lymphadenopathy- none Head- atraumatic            Eyes- Gross vision intact, PERRLA,  conjunctivae clear secretions            Ears- Hearing, canals-normal            Nose- Clear, no-Septal dev, mucus, polyps, erosion, perforation             Throat- Mallampati II , mucosa clear , drainage- none, tonsils- atrophic Neck- flexible , trachea midline, no stridor , thyroid nl, carotid no bruit Chest - symmetrical excursion , unlabored           Heart/CV- RRR , faint systolic Ao murmur , no gallop  , no rub, nl s1 s2                           - JVD- none , edema- none, stasis changes- none, varices- none           Lung- clear to P&A, but distant.,  wheeze- none, cough- none , dullness-none, rub- none           Chest wall-  Abd-  Br/ Gen/ Rectal- Not done, not indicated Extrem- cyanosis- none, clubbing, none, atrophy- none, strength- nl Neuro- grossly intact to observation

## 2014-02-21 NOTE — Patient Instructions (Signed)
Order- CXR  Dx COPD/ Asthma

## 2014-02-27 DIAGNOSIS — I10 Essential (primary) hypertension: Secondary | ICD-10-CM | POA: Diagnosis not present

## 2014-02-28 ENCOUNTER — Telehealth: Payer: Self-pay | Admitting: Internal Medicine

## 2014-02-28 NOTE — Telephone Encounter (Signed)
Notes Recorded by Deneise Lever, MD on 02/21/2014 at 4:49 PM CXR- lungs are clear, with no active heart or lung process noted   I spoke with patient about results and she verbalized understanding and had no questions

## 2014-03-09 ENCOUNTER — Other Ambulatory Visit: Payer: Self-pay | Admitting: Neurosurgery

## 2014-03-09 DIAGNOSIS — M5126 Other intervertebral disc displacement, lumbar region: Secondary | ICD-10-CM | POA: Diagnosis not present

## 2014-03-09 DIAGNOSIS — M545 Low back pain, unspecified: Secondary | ICD-10-CM

## 2014-03-14 ENCOUNTER — Ambulatory Visit
Admission: RE | Admit: 2014-03-14 | Discharge: 2014-03-14 | Disposition: A | Discharge status: Home / Self Care | Payer: Medicare Other | Source: Ambulatory Visit | Attending: Neurosurgery | Admitting: Neurosurgery

## 2014-03-14 VITALS — BP 210/77 | HR 69

## 2014-03-14 DIAGNOSIS — M545 Low back pain, unspecified: Secondary | ICD-10-CM

## 2014-03-14 DIAGNOSIS — M25569 Pain in unspecified knee: Secondary | ICD-10-CM

## 2014-03-14 DIAGNOSIS — M5137 Other intervertebral disc degeneration, lumbosacral region: Secondary | ICD-10-CM | POA: Diagnosis not present

## 2014-03-14 DIAGNOSIS — M25559 Pain in unspecified hip: Secondary | ICD-10-CM

## 2014-03-14 MED ORDER — IOHEXOL 180 MG/ML  SOLN
1.0000 mL | Freq: Once | INTRAMUSCULAR | Status: AC | PRN
  Administered 2014-03-14: 1 mL via EPIDURAL

## 2014-03-14 MED ORDER — METHYLPREDNISOLONE ACETATE 40 MG/ML INJ SUSP (RADIOLOG
120.0000 mg | Freq: Once | INTRAMUSCULAR | Status: AC
  Administered 2014-03-14: 120 mg via EPIDURAL

## 2014-03-14 NOTE — Discharge Instructions (Signed)

## 2014-03-20 DIAGNOSIS — M353 Polymyalgia rheumatica: Secondary | ICD-10-CM | POA: Diagnosis not present

## 2014-03-20 DIAGNOSIS — M810 Age-related osteoporosis without current pathological fracture: Secondary | ICD-10-CM | POA: Diagnosis not present

## 2014-03-20 DIAGNOSIS — M1990 Unspecified osteoarthritis, unspecified site: Secondary | ICD-10-CM | POA: Diagnosis not present

## 2014-03-22 DIAGNOSIS — E119 Type 2 diabetes mellitus without complications: Secondary | ICD-10-CM | POA: Diagnosis not present

## 2014-03-24 NOTE — Assessment & Plan Note (Signed)
Considering pacemaker

## 2014-03-24 NOTE — Assessment & Plan Note (Signed)
Good oxygen saturation on room air. Clear and usually comfortable. Rare episode of dyspnea relieved and Spiriva. I do not think she has a pulmonary limitation for contemplated surgery.

## 2014-03-30 DIAGNOSIS — E785 Hyperlipidemia, unspecified: Secondary | ICD-10-CM | POA: Diagnosis not present

## 2014-03-30 DIAGNOSIS — I10 Essential (primary) hypertension: Secondary | ICD-10-CM | POA: Diagnosis not present

## 2014-03-30 DIAGNOSIS — E039 Hypothyroidism, unspecified: Secondary | ICD-10-CM | POA: Diagnosis not present

## 2014-03-30 DIAGNOSIS — E109 Type 1 diabetes mellitus without complications: Secondary | ICD-10-CM | POA: Diagnosis not present

## 2014-03-30 DIAGNOSIS — D509 Iron deficiency anemia, unspecified: Secondary | ICD-10-CM | POA: Diagnosis not present

## 2014-04-11 DIAGNOSIS — I10 Essential (primary) hypertension: Secondary | ICD-10-CM | POA: Diagnosis not present

## 2014-04-11 DIAGNOSIS — M5126 Other intervertebral disc displacement, lumbar region: Secondary | ICD-10-CM | POA: Diagnosis not present

## 2014-04-14 DIAGNOSIS — M5126 Other intervertebral disc displacement, lumbar region: Secondary | ICD-10-CM | POA: Diagnosis not present

## 2014-06-29 DIAGNOSIS — I10 Essential (primary) hypertension: Secondary | ICD-10-CM | POA: Diagnosis not present

## 2014-07-13 DIAGNOSIS — I10 Essential (primary) hypertension: Secondary | ICD-10-CM | POA: Diagnosis not present

## 2014-07-13 DIAGNOSIS — Z6825 Body mass index (BMI) 25.0-25.9, adult: Secondary | ICD-10-CM | POA: Diagnosis not present

## 2014-07-13 DIAGNOSIS — M5126 Other intervertebral disc displacement, lumbar region: Secondary | ICD-10-CM | POA: Diagnosis not present

## 2014-07-20 DIAGNOSIS — M1990 Unspecified osteoarthritis, unspecified site: Secondary | ICD-10-CM | POA: Diagnosis not present

## 2014-07-20 DIAGNOSIS — M353 Polymyalgia rheumatica: Secondary | ICD-10-CM | POA: Diagnosis not present

## 2014-07-20 DIAGNOSIS — M810 Age-related osteoporosis without current pathological fracture: Secondary | ICD-10-CM | POA: Diagnosis not present

## 2014-07-24 ENCOUNTER — Ambulatory Visit (INDEPENDENT_AMBULATORY_CARE_PROVIDER_SITE_OTHER): Payer: Medicare Other | Admitting: Ophthalmology

## 2014-07-24 DIAGNOSIS — D3130 Benign neoplasm of unspecified choroid: Secondary | ICD-10-CM

## 2014-07-24 DIAGNOSIS — I10 Essential (primary) hypertension: Secondary | ICD-10-CM

## 2014-07-24 DIAGNOSIS — H35039 Hypertensive retinopathy, unspecified eye: Secondary | ICD-10-CM

## 2014-07-24 DIAGNOSIS — E1165 Type 2 diabetes mellitus with hyperglycemia: Secondary | ICD-10-CM

## 2014-07-24 DIAGNOSIS — H43819 Vitreous degeneration, unspecified eye: Secondary | ICD-10-CM

## 2014-07-24 DIAGNOSIS — H3530 Unspecified macular degeneration: Secondary | ICD-10-CM | POA: Diagnosis not present

## 2014-07-24 DIAGNOSIS — E11319 Type 2 diabetes mellitus with unspecified diabetic retinopathy without macular edema: Secondary | ICD-10-CM | POA: Diagnosis not present

## 2014-07-24 DIAGNOSIS — E1139 Type 2 diabetes mellitus with other diabetic ophthalmic complication: Secondary | ICD-10-CM

## 2014-08-09 DIAGNOSIS — Z23 Encounter for immunization: Secondary | ICD-10-CM | POA: Diagnosis not present

## 2014-09-26 DIAGNOSIS — E039 Hypothyroidism, unspecified: Secondary | ICD-10-CM | POA: Diagnosis not present

## 2014-09-26 DIAGNOSIS — I10 Essential (primary) hypertension: Secondary | ICD-10-CM | POA: Diagnosis not present

## 2014-09-26 DIAGNOSIS — E785 Hyperlipidemia, unspecified: Secondary | ICD-10-CM | POA: Diagnosis not present

## 2014-09-26 DIAGNOSIS — E1165 Type 2 diabetes mellitus with hyperglycemia: Secondary | ICD-10-CM | POA: Diagnosis not present

## 2014-10-24 DIAGNOSIS — M159 Polyosteoarthritis, unspecified: Secondary | ICD-10-CM | POA: Diagnosis not present

## 2014-10-24 DIAGNOSIS — M353 Polymyalgia rheumatica: Secondary | ICD-10-CM | POA: Diagnosis not present

## 2014-10-24 DIAGNOSIS — M810 Age-related osteoporosis without current pathological fracture: Secondary | ICD-10-CM | POA: Diagnosis not present

## 2014-11-14 DIAGNOSIS — G894 Chronic pain syndrome: Secondary | ICD-10-CM | POA: Diagnosis not present

## 2014-11-14 DIAGNOSIS — Z79899 Other long term (current) drug therapy: Secondary | ICD-10-CM | POA: Diagnosis not present

## 2014-11-23 ENCOUNTER — Ambulatory Visit: Payer: Medicare Other | Admitting: Nurse Practitioner

## 2014-11-24 ENCOUNTER — Ambulatory Visit: Payer: Medicare Other | Admitting: Nurse Practitioner

## 2014-12-26 DIAGNOSIS — M353 Polymyalgia rheumatica: Secondary | ICD-10-CM | POA: Diagnosis not present

## 2014-12-26 DIAGNOSIS — I10 Essential (primary) hypertension: Secondary | ICD-10-CM | POA: Diagnosis not present

## 2014-12-26 DIAGNOSIS — M159 Polyosteoarthritis, unspecified: Secondary | ICD-10-CM | POA: Diagnosis not present

## 2015-01-09 DIAGNOSIS — M5137 Other intervertebral disc degeneration, lumbosacral region: Secondary | ICD-10-CM | POA: Diagnosis not present

## 2015-01-30 ENCOUNTER — Ambulatory Visit: Payer: Medicare Other | Admitting: Nurse Practitioner

## 2015-02-01 ENCOUNTER — Encounter: Payer: Self-pay | Admitting: Neurology

## 2015-02-01 ENCOUNTER — Ambulatory Visit (INDEPENDENT_AMBULATORY_CARE_PROVIDER_SITE_OTHER): Payer: Medicare Other | Admitting: Neurology

## 2015-02-01 VITALS — BP 124/66 | HR 60 | Resp 16 | Ht 60.0 in | Wt 137.0 lb

## 2015-02-01 DIAGNOSIS — M545 Low back pain, unspecified: Secondary | ICD-10-CM

## 2015-02-01 DIAGNOSIS — R269 Unspecified abnormalities of gait and mobility: Secondary | ICD-10-CM

## 2015-02-01 DIAGNOSIS — R413 Other amnesia: Secondary | ICD-10-CM

## 2015-02-01 MED ORDER — MEMANTINE HCL 10 MG PO TABS: 10.0000 mg | ORAL_TABLET | Freq: Two times a day (BID) | ORAL | Status: DC

## 2015-02-01 NOTE — Progress Notes (Addendum)
PATIENT: Michelle [name] DOB: [birth year]  HISTORICAL  Michelle [name] 82yo RH   HPI: Patient returns for followup for her mild memory trouble.  She has past medical history of diabetes, insulin-dependent, hypertension, hyperlipidemia, polymyalgia rheumatica, she also had a history of right knee replacement 02/02/2010, lumbar, cervical decompression surgery in the past.   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  MRI scan of the brain showed moderate degree of global cerebral atrophy and mild degree of changes of chronic microvascular ischemia.   Her maternal aunts suffered dementia,  her mother died of breast cancer at age 64, her father died of coronary artery disease at age 58.  Her paternal uncle, aunt suffered dementia,  She also reported a history of bradycardia, is followed by cardiologist, there was a period of time, pacemaker placement was considered, she is tolerating Aricept 5 mg, today's heart rate is 16s, after discussed with patient, we stopped Aricept, [place] was started since July 2014,   UPDATE Jan 15th 2015; She took Namenda for about 6 months, did not notice any significant difference, She has run out of namenda because of donut hole, she is less active because of bilateral hip and knee pain. She is using Fentanyl patch 37mg q week, instead of q3 days, it made her too sleepy.  UPDATE March 24th  2016; She lives alone, drives herself to clinic today, Mini-Mental Status Examination is 256out of 30, she is not oriented to date, has difficulty spell world backwards She is no longer taking Namenda because the high co-pay in the past, she complains worsening low back pain, had a previous lumbar, cervical decompression surgery by Dr. cSaintclair Halsted 3 epidural injection in [id number] most recent one was May 2015, failed to improve her symptoms, she is now using fentanyl patch 75 g, mild urinary incontinence, wearing pads, using a cane,   REVIEW OF SYSTEMS: Full 14 system review of systems performed and notable only for gait difficulty, low back pain  ALLERGIES: No Known Allergies  HOME MEDICATIONS: Current Outpatient Prescriptions  Medication Sig Dispense Refill  . fentaNYL (DURAGESIC - DOSED MCG/HR) 75 MCG/HR   0  . gabapentin (NEURONTIN) 100 MG capsule   3  . HYDROcodone-acetaminophen (VICODIN) 5-500 MG per tablet Take 325 tablets by mouth. Take 1/2 tablet by mouth daily up to twice daily if needed    . insulin lispro (HUMALOG) 100 UNIT/ML injection Inject into the skin as directed. prn    . insulin NPH (HUMULIN N,NOVOLIN N) 100 UNIT/ML injection Inject 42 Units into the skin as directed.     .Marland Kitchenlevothyroxine (SYNTHROID, LEVOTHROID) 88 MCG tablet Take 88 mcg by mouth daily.      .Marland KitchenLIVALO 4 MG TABS 3 x a week    . Multiple Vitamin (MULTIVITAMIN) tablet Take 1 tablet by mouth daily.      . ONE TOUCH ULTRA TEST test strip   2  . predniSONE (  DELTASONE) 5 MG tablet Take 5 mg by mouth daily.      Marland Kitchen telmisartan (MICARDIS) 40 MG tablet Take 1 tablet by mouth daily.    Marland Kitchen tiotropium (SPIRIVA HANDIHALER) 18 MCG inhalation capsule Place 1 capsule (18 mcg total) into inhaler and inhale daily. 90 capsule 3   No current facility-administered medications for this visit.    PAST MEDICAL HISTORY: Past Medical History  Diagnosis Date  . Type II or unspecified type diabetes mellitus without mention of complication,  not stated as uncontrolled   . Dyslipidemia   . Asthma   . Arthritis   . Gallstones   . COPD (chronic obstructive pulmonary disease)   . Interstitial cystitis   . UTI (lower urinary tract infection)   . Malaise and fatigue   . Memory loss   . Coronary atherosclerosis   . Systemic hypertension   . Polymyalgia rheumatica     PAST SURGICAL HISTORY: Past Surgical History  Procedure Laterality Date  . Lumbar disc surgery    . Total abdominal hysterectomy    . Rotator cuff repair    . Tonsillectomy    . Cervical spine surgery    . Cholecystectomy    . Hernia repair    . Cardiac catheterization  01/29/2006    10% luminal irregularity mid LAD  . Cardiac catheterization  03/15/2001    No significant CAD, no evidence of renal artery stenosis, systemic hypertenion  . US echocardiography  09/14/2007    mild LVH, LA mildly dilated,mild mitral annular calcification, AOV mildly sclerotic, aortic root sclerosis/ca+  . Nm myocar perf wall motion  08/17/2009    normal    FAMILY HISTORY: Family History  Problem Relation Age of Onset  . Breast cancer Mother   . Heart disease Father   . Lung cancer Brother     SOCIAL HISTORY:  History   Social History  . Marital Status: Widowed    Spouse Name: N/A  . Number of Children: 4  . Years of Education: 12   Occupational History  . retired    Social History Main Topics  . Smoking status: Never Smoker   . Smokeless tobacco: Never Used  . Alcohol Use: No  . Drug Use: No  . Sexual Activity: Not on file   Other Topics Concern  . Not on file   Social History Narrative   Patient lives at home alone. Widowed.   Retired.   Education High school   Right handed   Caffeine- some times coffee and soda one cup.     PHYSICAL EXAM   Filed Vitals:   02/01/15 1616  BP: 124/66  Pulse: 60  Resp: 16  Height: 5' (1.524 m)  Weight: 137 lb (62.143 kg)    Not recorded      Body mass index is 26.76 kg/(m^2).  PHYSICAL  EXAMNIATION:  Gen: NAD, conversant, well nourised, obese, well groomed                     Cardiovascular: Regular rate rhythm, no peripheral edema, warm, nontender. Eyes: Conjunctivae clear without exudates or hemorrhage Neck: Supple, no carotid bruise. Pulmonary: Clear to auscultation bilaterally   NEUROLOGICAL EXAM:  MENTAL STATUS: Speech:    Speech is normal; fluent and spontaneous with normal comprehension.  Cognition:   Mini-Mental Status Examination is 27 out of 30, she is not oriented to date, has difficulty spell world backwards,  CRANIAL NERVES: CN II: Visual fields are full to confrontation. Fundoscopic  exam is normal with sharp discs and no vascular changes. Venous pulsations are present bilaterally. Pupils are 4 mm and briskly reactive to light. Visual acuity is 20/20 bilaterally. CN III, IV, VI: extraocular movement are normal. No ptosis. CN V: Facial sensation is intact to pinprick in all 3 divisions bilaterally. Corneal responses are intact.  CN VII: Face is symmetric with normal eye closure and smile. CN VIII: Hearing is normal to rubbing fingers CN IX, X: Palate elevates symmetrically. Phonation is normal. CN XI: Head turning and shoulder shrug are intact CN XII: Tongue is midline with normal movements and no atrophy.  MOTOR: There is no pronator drift of out-stretched arms. Muscle bulk and tone are normal. Muscle strength is normal.   Shoulder abduction Shoulder external rotation Elbow flexion Elbow extension Wrist flexion Wrist extension Finger abduction Hip flexion Knee flexion Knee extension Ankle dorsi flexion Ankle plantar flexion  R '5 5 5 5 5 5 5 5 5 5 5 5  ' L '5 5 5 5 5 5 5 5 5 5 5 5    ' REFLEXES: Reflexes are 2+ and symmetric at the biceps, triceps, knees, and ankles. Plantar responses are flexor.  SENSORY: Light touch, pinprick, position sense, and vibration sense are intact in fingers and toes.  COORDINATION: Rapid alternating movements and fine  finger movements are intact. There is no dysmetria on finger-to-nose and heel-knee-shin. There are no abnormal or extraneous movements.   GAIT/STANCE: Rely on her cane, cautious, antalgic, wide-based, mild unsteady gait Romberg is absent.   DIAGNOSTIC DATA (LABS, IMAGING, TESTING) - I reviewed patient records, labs, notes, testing and imaging myself where available.  Lab Results  Component Value Date   WBC 7.0 02/14/2013   HGB 12.6 02/14/2013   HCT 37.4 02/14/2013   MCV 89.6 02/14/2013   PLT 246.0 02/14/2013      Component Value Date/Time   NA 135 02/14/2013 1151   K 3.7 02/14/2013 1151   CL 97 02/14/2013 1151   CO2 26 02/14/2013 1151   GLUCOSE 351* 02/14/2013 1151   BUN 22 02/14/2013 1151   CREATININE 1.0 02/14/2013 1151   CALCIUM 8.8 02/14/2013 1151   PROT 6.9 02/14/2013 1151   ALBUMIN 4.2 02/14/2013 1151   AST 22 02/14/2013 1151   ALT 14 02/14/2013 1151   ALKPHOS 75 02/14/2013 1151   BILITOT 0.8 02/14/2013 1151   GFRNONAA 54* 03/27/2010 0400   GFRAA  03/27/2010 0400    >60        The eGFR has been calculated using the MDRD equation. This calculation has not been validated in all clinical situations. eGFR's persistently <60 mL/min signify possible Chronic Kidney Disease.   No results found for: CHOL, HDL, LDLCALC, LDLDIRECT, TRIG, CHOLHDL No results found for: HGBA1C No results found for: VITAMINB12 Lab Results  Component Value Date   TSH 0.68 02/14/2013      ASSESSMENT AND PLAN  Michelle [name] is a 81 y.o. female with gradual onset memory trouble, today's Mini-Mental status examination is 27 out of 30, she also complains of chronic low back pain, gait difficulty,  1. Mild cognitive impairment, responded to Namenda in the past, will re-started Namenda 10 mg twice a day 2, chronic low back pain, gait difficulty, likely lumbar radiculopathy, moderate exercise, pain management 3, return to clinic in 6 months    Marcial Pacas, M.D. Ph.D.  [facility] [address] [place], [place] [address] Ph: [phone] Fax: [phone]

## 2015-02-22 ENCOUNTER — Encounter: Payer: Self-pay | Admitting: Internal Medicine

## 2015-02-22 ENCOUNTER — Ambulatory Visit (INDEPENDENT_AMBULATORY_CARE_PROVIDER_SITE_OTHER): Payer: Medicare Other | Admitting: Internal Medicine

## 2015-02-22 VITALS — BP 134/82 | HR 58 | Ht 62.0 in | Wt 138.4 lb

## 2015-02-22 DIAGNOSIS — J449 Chronic obstructive pulmonary disease, unspecified: Secondary | ICD-10-CM

## 2015-02-22 MED ORDER — TIOTROPIUM BROMIDE MONOHYDRATE 18 MCG IN CAPS: 18.0000 ug | ORAL_CAPSULE | Freq: Every day | RESPIRATORY_TRACT | Status: DC

## 2015-02-22 NOTE — Patient Instructions (Signed)
My nurse will work on the application to renew your assistance for Spiriva for this next year. Script written.

## 2015-02-22 NOTE — Assessment & Plan Note (Signed)
Chronic fixed asthma and an uncertain component of COPD from secondhand smoke exposure. Spiriva helps. Plan-help with application for financial assistance for Spiriva

## 2015-02-22 NOTE — Progress Notes (Signed)
Patient ID: Michelle [name], female    DOB: 1932-01-25, 81 y.o.   MRN: [id number]  HPI 02/20/11-78 yoF never smoker, followed for COPD/ asthma, complicated by Polymyalgia Rheumatica. Last here February 21, 2010. Husband was a long time smoker- so she had significant second hand exposure and has continued also to heat with wood . She ran out of Advair using once daily, when she reached donut hole status last November. Didn't use it at all November and December. Now back at once daily. Notices some modest benefit, but still can't afford the Advair and asks alternatives. Has not had a rescue inhaler. Wheeze most days, sometimes worse than others. Cough scant sputum most mornings.  Blows nose. Denies chest pain. Has had occasional palpitations. Sees Dr Elisabeth Cara for cardiology. Tolerated right knee replacement last year.   08/22/11- 78 yoF never smoker, followed for COPD/ asthma, complicated by Polymyalgia Rheumatica Spiriva well but was too expensive. She tried Atrovent HFA but says it caused nausea. She is limiting Advair to  Medicare gap cost. She remains aware of shortness of breath with exertion on hills and stairs and says this may be very slowly getting worse without acute change. She notices more clear phlegm coughed up in the mornings. She has long-standing pain in the right anterior chest wall with deep breath. This has been going on for years.  02/23/12- 78 yoF never smoker, followed for COPD/ asthma, complicated by Polymyalgia Rheumatica PCP Dr Juanda Crumble Phillips/ [place] No significant respiratory infection all winter. Occasional minimal wheeze. Limited more by arthritis done by her breathing. Can't usually afford Advair. Gets assistance with Spiriva which is sufficient.  02/22/13- 80 yoF never smoker, followed for COPD/ asthma, complicated by Polymyalgia Rheumatica PCP Dr Juanda Crumble Phillips/ [place] FOLLOWS [id number] like breathing was getting worse and forgot to bring pt assistance forms  for [place] to sign for Spiriva. She came today, upset because she had just lost her pocketbook. Because of cost we had stopped Advair and she was just using Spiriva occasionally. Chronic right upper anterior chest pain with deep breath has been present for years with no objective findings. Some awareness of dyspnea on exertion, may be slowly getting worse. No acute events, significant cough, wheeze, palpitation or swelling.  02/21/14- 81 yoF never smoker, followed for COPD/ asthma, complicated by Polymyalgia Rheumatica PCP Dr Juanda Crumble Phillips/ [place] FOLLOWS FOR: Had episode of hard to breathe but has gotten better-back to were she used to be. Distracted by her back problems. Episode of dyspnea was helped with Spiriva. She is considering surgery for joints and pacemaker.  02/22/15- 81 yoF never smoker, followed for COPD/ asthma, complicated by Polymyalgia Rheumatica  FOLLOWS FOR: Pt states she has SOB with any activity. Denies any wheezing and chest tightness/congestion, and cough. In. She needs help renewing application for assistance from the manufacturer for this drug. Admits some dyspnea on exertion with hills and stairs but little cough or wheeze. CXR 02/21/14 IMPRESSION: No active cardiopulmonary disease. electronically Signed  By: Lajean Manes M.D.  On: 02/21/2014 15:59  Review of Systems-Per HPI Constitutional:   No-   weight loss, night sweats, fevers, chills, fatigue, lassitude. HEENT:   No-  headaches, difficulty swallowing, tooth/dental problems, sore throat,       No-  sneezing, itching, ear ache, nasal congestion, post nasal drip,  CV:  No- chest pain, no- orthopnea, PND, swelling in lower extremities, anasarca,  dizziness, palpitations Resp: +  shortness of breath with exertion or at rest.  No- productive cough,  No non-productive cough,  No-  coughing up of blood.              No-   change in color of mucus.  No- wheezing.   Skin: No-   rash or lesions. GI:   No-   heartburn, indigestion, abdominal pain, nausea, vomiting,  GU:  MS:  No-   joint pain or swelling.  Neuro- nothing unusual Psych:  No- change in mood or affect. No depression or anxiety.  No memory loss.  Objective:   Physical Exam General- Alert, Oriented, Affect-appropriate, Distress- none acute. Talkative without apparent   breathlessness Skin- rash-none, lesions- none, excoriation- none Lymphadenopathy- none Head- atraumatic            Eyes- Gross vision intact, PERRLA, conjunctivae clear secretions            Ears- Hearing, canals-normal            Nose- Clear, no-Septal dev, mucus, polyps, erosion, perforation             Throat- Mallampati II , mucosa clear , drainage- none, tonsils- atrophic Neck- flexible , trachea midline, no stridor , thyroid nl, carotid no bruit Chest - symmetrical excursion , unlabored           Heart/CV- RRR , faint systolic Ao murmur , no gallop  , no rub, nl s1 s2                           - JVD- none , edema- none, stasis changes- none, varices- none           Lung- clear to P&A, but distant.,  wheeze- none, cough- none , dullness-none, rub- none           Chest wall-  Abd-  Br/ Gen/ Rectal- Not done, not indicated Extrem- cyanosis- none, clubbing, none, atrophy- none, strength- nl, +cane Neuro- grossly intact to observation

## 2015-02-24 ENCOUNTER — Emergency Department (HOSPITAL_COMMUNITY)
Admission: EM | Admit: 2015-02-24 | Discharge: 2015-02-24 | Disposition: A | Discharge status: Home / Self Care | Payer: Medicare Other | Attending: Emergency Medicine | Admitting: Emergency Medicine

## 2015-02-24 ENCOUNTER — Emergency Department (HOSPITAL_COMMUNITY): Payer: Medicare Other

## 2015-02-24 ENCOUNTER — Encounter (HOSPITAL_COMMUNITY): Payer: Self-pay | Admitting: Nurse Practitioner

## 2015-02-24 DIAGNOSIS — Z7952 Long term (current) use of systemic steroids: Secondary | ICD-10-CM | POA: Diagnosis not present

## 2015-02-24 DIAGNOSIS — Z79899 Other long term (current) drug therapy: Secondary | ICD-10-CM | POA: Insufficient documentation

## 2015-02-24 DIAGNOSIS — I2510 Atherosclerotic heart disease of native coronary artery without angina pectoris: Secondary | ICD-10-CM | POA: Diagnosis not present

## 2015-02-24 DIAGNOSIS — M1990 Unspecified osteoarthritis, unspecified site: Secondary | ICD-10-CM | POA: Diagnosis not present

## 2015-02-24 DIAGNOSIS — Z87448 Personal history of other diseases of urinary system: Secondary | ICD-10-CM | POA: Diagnosis not present

## 2015-02-24 DIAGNOSIS — R29810 Facial weakness: Secondary | ICD-10-CM

## 2015-02-24 DIAGNOSIS — Z87440 Personal history of urinary (tract) infections: Secondary | ICD-10-CM | POA: Diagnosis not present

## 2015-02-24 DIAGNOSIS — Z794 Long term (current) use of insulin: Secondary | ICD-10-CM | POA: Diagnosis not present

## 2015-02-24 DIAGNOSIS — R40243 Glasgow coma scale score 3-8: Secondary | ICD-10-CM | POA: Diagnosis not present

## 2015-02-24 DIAGNOSIS — E11649 Type 2 diabetes mellitus with hypoglycemia without coma: Secondary | ICD-10-CM | POA: Insufficient documentation

## 2015-02-24 DIAGNOSIS — R410 Disorientation, unspecified: Secondary | ICD-10-CM | POA: Insufficient documentation

## 2015-02-24 DIAGNOSIS — Z8719 Personal history of other diseases of the digestive system: Secondary | ICD-10-CM | POA: Insufficient documentation

## 2015-02-24 DIAGNOSIS — J449 Chronic obstructive pulmonary disease, unspecified: Secondary | ICD-10-CM | POA: Insufficient documentation

## 2015-02-24 DIAGNOSIS — R4781 Slurred speech: Secondary | ICD-10-CM | POA: Insufficient documentation

## 2015-02-24 DIAGNOSIS — E162 Hypoglycemia, unspecified: Secondary | ICD-10-CM

## 2015-02-24 DIAGNOSIS — J45909 Unspecified asthma, uncomplicated: Secondary | ICD-10-CM | POA: Diagnosis not present

## 2015-02-24 DIAGNOSIS — Z9889 Other specified postprocedural states: Secondary | ICD-10-CM | POA: Diagnosis not present

## 2015-02-24 LAB — CBC
HCT: 35.3 % — ABNORMAL LOW (ref 36.0–46.0)
Hemoglobin: 11.8 g/dL — ABNORMAL LOW (ref 12.0–15.0)
MCH: 30.6 pg (ref 26.0–34.0)
MCHC: 33.4 g/dL (ref 30.0–36.0)
MCV: 91.5 fL (ref 78.0–100.0)
Platelets: 236 10*3/uL (ref 150–400)
RBC: 3.86 MIL/uL — ABNORMAL LOW (ref 3.87–5.11)
RDW: 13.9 % (ref 11.5–15.5)
WBC: 7.4 10*3/uL (ref 4.0–10.5)

## 2015-02-24 LAB — COMPREHENSIVE METABOLIC PANEL
ALT: 16 U/L (ref 0–35)
ANION GAP: 10 (ref 5–15)
AST: 25 U/L (ref 0–37)
Albumin: 4.1 g/dL (ref 3.5–5.2)
Alkaline Phosphatase: 59 U/L (ref 39–117)
BUN: 30 mg/dL — ABNORMAL HIGH (ref 6–23)
CALCIUM: 9.1 mg/dL (ref 8.4–10.5)
CO2: 24 mmol/L (ref 19–32)
Chloride: 106 mmol/L (ref 96–112)
Creatinine, Ser: 1.35 mg/dL — ABNORMAL HIGH (ref 0.50–1.10)
GFR calc non Af Amer: 36 mL/min — ABNORMAL LOW (ref >90)
GFR, EST AFRICAN AMERICAN: 42 mL/min — AB (ref >90)
Glucose, Bld: 57 mg/dL — ABNORMAL LOW (ref 70–99)
Potassium: 4.2 mmol/L (ref 3.5–5.1)
SODIUM: 140 mmol/L (ref 135–145)
Total Bilirubin: 0.4 mg/dL (ref 0.3–1.2)
Total Protein: 6.2 g/dL (ref 6.0–8.3)

## 2015-02-24 LAB — CBG MONITORING, ED
GLUCOSE-CAPILLARY: 102 mg/dL — AB (ref 70–99)
GLUCOSE-CAPILLARY: 187 mg/dL — AB (ref 70–99)
Glucose-Capillary: 51 mg/dL — ABNORMAL LOW (ref 70–99)
Glucose-Capillary: 165 mg/dL — ABNORMAL HIGH (ref 70–99)
Glucose-Capillary: 70 mg/dL (ref 70–99)

## 2015-02-24 LAB — I-STAT CHEM 8, ED
BUN: 33 mg/dL — ABNORMAL HIGH (ref 6–23)
CHLORIDE: 106 mmol/L (ref 96–112)
Calcium, Ion: 1.22 mmol/L (ref 1.13–1.30)
Creatinine, Ser: 1.3 mg/dL — ABNORMAL HIGH (ref 0.50–1.10)
Glucose, Bld: 54 mg/dL — ABNORMAL LOW (ref 70–99)
HCT: 37 % (ref 36.0–46.0)
HEMOGLOBIN: 12.6 g/dL (ref 12.0–15.0)
Potassium: 4.1 mmol/L (ref 3.5–5.1)
Sodium: 141 mmol/L (ref 135–145)
TCO2: 22 mmol/L (ref 0–100)

## 2015-02-24 LAB — URINE MICROSCOPIC-ADD ON

## 2015-02-24 LAB — DIFFERENTIAL
BASOS PCT: 0 % (ref 0–1)
Basophils Absolute: 0 10*3/uL (ref 0.0–0.1)
EOS PCT: 0 % (ref 0–5)
Eosinophils Absolute: 0 10*3/uL (ref 0.0–0.7)
LYMPHS ABS: 1.8 10*3/uL (ref 0.7–4.0)
LYMPHS PCT: 24 % (ref 12–46)
MONO ABS: 0.8 10*3/uL (ref 0.1–1.0)
MONOS PCT: 10 % (ref 3–12)
NEUTROS ABS: 4.9 10*3/uL (ref 1.7–7.7)
Neutrophils Relative %: 66 % (ref 43–77)

## 2015-02-24 LAB — URINALYSIS, ROUTINE W REFLEX MICROSCOPIC
BILIRUBIN URINE: NEGATIVE
Glucose, UA: NEGATIVE mg/dL
Hgb urine dipstick: NEGATIVE
KETONES UR: NEGATIVE mg/dL
Nitrite: NEGATIVE
PH: 5 (ref 5.0–8.0)
PROTEIN: 30 mg/dL — AB
Specific Gravity, Urine: 1.013 (ref 1.005–1.030)
UROBILINOGEN UA: 0.2 mg/dL (ref 0.0–1.0)

## 2015-02-24 LAB — RAPID URINE DRUG SCREEN, HOSP PERFORMED
Amphetamines: NOT DETECTED
Barbiturates: NOT DETECTED
Benzodiazepines: NOT DETECTED
COCAINE: NOT DETECTED
Opiates: NOT DETECTED
Tetrahydrocannabinol: NOT DETECTED

## 2015-02-24 LAB — PROTIME-INR
INR: 1.05 (ref 0.00–1.49)
PROTHROMBIN TIME: 13.9 s (ref 11.6–15.2)

## 2015-02-24 LAB — ETHANOL: Alcohol, Ethyl (B): <5 mg/dL (ref 0–9)

## 2015-02-24 LAB — APTT: aPTT: 30 seconds (ref 24–37)

## 2015-02-24 LAB — I-STAT TROPONIN, ED: TROPONIN I, POC: 0 ng/mL (ref 0.00–0.08)

## 2015-02-24 MED ORDER — DEXTROSE-NACL 5-0.45 % IV SOLN
INTRAVENOUS | Status: DC
  Administered 2015-02-24: 17:00:00 via INTRAVENOUS

## 2015-02-24 MED ORDER — DEXTROSE 50 % IV SOLN
1.0000 | Freq: Once | INTRAVENOUS | Status: AC
  Administered 2015-02-24: 50 mL via INTRAVENOUS

## 2015-02-24 NOTE — ED Notes (Signed)
Dr. Aram Beecham updated family on plan of care and canceled code stroke. Family states facial droop appears to be at patients baseline. Patient alert and oriented- shivering and unable to obtain oral temperature at present time.

## 2015-02-24 NOTE — ED Notes (Signed)
Patient IV infiltrated. 1.53mL aspirated from catheter site and IV catheter removed intact. Bleeding controlled and site iced and elevated.

## 2015-02-24 NOTE — ED Provider Notes (Signed)
CSN: [id number]     Arrival date & time 02/24/15  1515 History   First MD Initiated Contact with Patient 02/24/15 1517     Chief Complaint  Patient presents with  . Code Stroke  . Hypoglycemia    An emergency department physician performed an initial assessment on this suspected stroke patient at 102. (Consider location/radiation/quality/duration/timing/severity/associated sxs/prior Treatment) The history is provided by the patient and the EMS personnel.   patient found at home unresponsive by family members around the 3:00. Patient last seen normal at 1:00 in the afternoon. Patient blood sugar by EMS was 20 given an amp of D50 to become more alert but dad the verbally nonresponsive then gradually Speaking back but had slurred speech. Left-sided facial droop noted. After that the D50 blood sugar went up to 258. Then dropped back down to 86. Patient is a known diabetic. Code stroke called in the field prior to arrival.  Past Medical History  Diagnosis Date  . Type II or unspecified type diabetes mellitus without mention of complication, not stated as uncontrolled   . Dyslipidemia   . Asthma   . Arthritis   . Gallstones   . COPD (chronic obstructive pulmonary disease)   . Interstitial cystitis   . UTI (lower urinary tract infection)   . Malaise and fatigue   . Memory loss   . Coronary atherosclerosis   . Systemic hypertension   . Polymyalgia rheumatica    Past Surgical History  Procedure Laterality Date  . Lumbar disc surgery    . Total abdominal hysterectomy    . Rotator cuff repair    . Tonsillectomy    . Cervical spine surgery    . Cholecystectomy    . Hernia repair    . Cardiac catheterization  01/29/2006    10% luminal irregularity mid LAD  . Cardiac catheterization  03/15/2001    No significant CAD, no evidence of renal artery stenosis, systemic hypertenion  . US echocardiography  09/14/2007    mild LVH, LA mildly dilated,mild mitral annular calcification, AOV mildly  sclerotic, aortic root sclerosis/ca+  . Nm myocar perf wall motion  08/17/2009    normal   Family History  Problem Relation Age of Onset  . Breast cancer Mother   . Heart disease Father   . Lung cancer Brother    History  Substance Use Topics  . Smoking status: Never Smoker   . Smokeless tobacco: Never Used  . Alcohol Use: No   OB History    No data available     Review of Systems  Constitutional: Negative for fever.  HENT: Negative for congestion.   Eyes: Negative for redness.  Respiratory: Negative for shortness of breath.   Cardiovascular: Negative for chest pain.  Gastrointestinal: Negative for nausea, vomiting and abdominal pain.  Genitourinary: Negative for dysuria.  Musculoskeletal: Negative for back pain.  Skin: Negative for rash.  Neurological: Positive for speech difficulty and weakness. Negative for numbness and headaches.  Hematological: Does not bruise/bleed easily.  Psychiatric/Behavioral: Positive for confusion.      Allergies  Review of patient's allergies indicates no known allergies.  Home Medications   Prior to Admission medications   Medication Sig Start Date End Date Taking? Authorizing Provider  fentaNYL (DURAGESIC - DOSED MCG/HR) 75 MCG/HR Place 75 mcg onto the skin every 3 (three) days.  12/26/14  Yes Historical Provider, MD  gabapentin (NEURONTIN) 100 MG capsule Take 100 mg by mouth daily.  12/15/14  Yes Historical Provider, MD  HYDROcodone-acetaminophen (NORCO/VICODIN) 5-325 MG per tablet Take 1 tablet by mouth daily.   Yes Historical Provider, MD  insulin lispro (HUMALOG) 100 UNIT/ML injection Inject 0-6 Units into the skin 3 (three) times daily. Patient uses sliding scale   Yes Historical Provider, MD  insulin NPH (HUMULIN N,NOVOLIN N) 100 UNIT/ML injection Inject 30 Units into the skin as directed.    Yes Historical Provider, MD  levothyroxine (SYNTHROID, LEVOTHROID) 88 MCG tablet Take 88 mcg by mouth daily.     Yes Historical Provider, MD   LIVALO 4 MG TABS Take 4 mg by mouth every Monday, Wednesday, and Friday. 3 x a week 02/15/13  Yes Historical Provider, MD  memantine (NAMENDA) 10 MG tablet Take 1 tablet (10 mg total) by mouth 2 (two) times daily. Patient taking differently: Take 10 mg by mouth daily.  02/01/15  Yes Marcial Pacas, MD  Multiple Vitamin (MULTIVITAMIN) tablet Take 1 tablet by mouth daily.     Yes Historical Provider, MD  ONE TOUCH ULTRA TEST test strip  01/11/15  Yes Historical Provider, MD  predniSONE (DELTASONE) 5 MG tablet Take 5 mg by mouth daily.     Yes Historical Provider, MD  telmisartan (MICARDIS) 40 MG tablet Take 1 tablet by mouth daily. 02/10/14  Yes Historical Provider, MD  tiotropium (SPIRIVA HANDIHALER) 18 MCG inhalation capsule Place 1 capsule (18 mcg total) into inhaler and inhale daily. 02/22/15 02/02/17 Yes Clinton D Young, MD   BP 110/58 mmHg  Pulse 55  Temp(Src) 97.9 F (36.6 C) (Oral)  Resp 12  SpO2 96% Physical Exam  Constitutional: She is oriented to person, place, and time. She appears well-developed. No distress.  HENT:  Head: Normocephalic and atraumatic.  Eyes: Conjunctivae and EOM are normal. Pupils are equal, round, and reactive to light.  Neck: Normal range of motion. Neck supple.  Cardiovascular: Normal rate, regular rhythm and normal heart sounds.   Pulmonary/Chest: Effort normal and breath sounds normal.  Abdominal: Soft. Bowel sounds are normal.  Musculoskeletal: Normal range of motion.  Neurological: She is alert and oriented to person, place, and time. No cranial nerve deficit. She exhibits normal muscle tone. Coordination normal.  Speech improved significantly upon arrival. No family members to confirm whether there was some slurred speech but no obviously of speech. Low bit of left facial droop still present. Extremities moving fine.  Nursing note and vitals reviewed.   ED Course  Procedures (including critical care time) Labs Review Labs Reviewed  CBC - Abnormal; Notable  for the following:    RBC 3.86 (*)    Hemoglobin 11.8 (*)    HCT 35.3 (*)    All other components within normal limits  COMPREHENSIVE METABOLIC PANEL - Abnormal; Notable for the following:    Glucose, Bld 57 (*)    BUN 30 (*)    Creatinine, Ser 1.35 (*)    GFR calc non Af Amer 36 (*)    GFR calc Af Amer 42 (*)    All other components within normal limits  URINALYSIS, ROUTINE W REFLEX MICROSCOPIC - Abnormal; Notable for the following:    APPearance CLOUDY (*)    Protein, ur 30 (*)    Leukocytes, UA TRACE (*)    All other components within normal limits  URINE MICROSCOPIC-ADD ON - Abnormal; Notable for the following:    Bacteria, UA MANY (*)    All other components within normal limits  I-STAT CHEM 8, ED - Abnormal; Notable for the following:    BUN 33 (*)  Creatinine, Ser 1.30 (*)    Glucose, Bld 54 (*)    All other components within normal limits  CBG MONITORING, ED - Abnormal; Notable for the following:    Glucose-Capillary 51 (*)    All other components within normal limits  CBG MONITORING, ED - Abnormal; Notable for the following:    Glucose-Capillary 165 (*)    All other components within normal limits  CBG MONITORING, ED - Abnormal; Notable for the following:    Glucose-Capillary 102 (*)    All other components within normal limits  CBG MONITORING, ED - Abnormal; Notable for the following:    Glucose-Capillary 187 (*)    All other components within normal limits  ETHANOL  PROTIME-INR  APTT  DIFFERENTIAL  URINE RAPID DRUG SCREEN (HOSP PERFORMED)  I-STAT TROPOININ, ED  I-STAT TROPOININ, ED  CBG MONITORING, ED   Results for orders placed or performed during the hospital encounter of 02/24/15  Ethanol  Result Value Ref Range   Alcohol, Ethyl (B) <5 0 - 9 mg/dL  Protime-INR  Result Value Ref Range   Prothrombin Time 13.9 11.6 - 15.2 seconds   INR 1.05 0.00 - 1.49  APTT  Result Value Ref Range   aPTT 30 24 - 37 seconds  CBC  Result Value Ref Range   WBC 7.4  4.0 - 10.5 K/uL   RBC 3.86 (L) 3.87 - 5.11 MIL/uL   Hemoglobin 11.8 (L) 12.0 - 15.0 g/dL   HCT 35.3 (L) 36.0 - 46.0 %   MCV 91.5 78.0 - 100.0 fL   MCH 30.6 26.0 - 34.0 pg   MCHC 33.4 30.0 - 36.0 g/dL   RDW 13.9 11.5 - 15.5 %   Platelets 236 150 - 400 K/uL  Differential  Result Value Ref Range   Neutrophils Relative % 66 43 - 77 %   Neutro Abs 4.9 1.7 - 7.7 K/uL   Lymphocytes Relative 24 12 - 46 %   Lymphs Abs 1.8 0.7 - 4.0 K/uL   Monocytes Relative 10 3 - 12 %   Monocytes Absolute 0.8 0.1 - 1.0 K/uL   Eosinophils Relative 0 0 - 5 %   Eosinophils Absolute 0.0 0.0 - 0.7 K/uL   Basophils Relative 0 0 - 1 %   Basophils Absolute 0.0 0.0 - 0.1 K/uL  Comprehensive metabolic panel  Result Value Ref Range   Sodium 140 135 - 145 mmol/L   Potassium 4.2 3.5 - 5.1 mmol/L   Chloride 106 96 - 112 mmol/L   CO2 24 19 - 32 mmol/L   Glucose, Bld 57 (L) 70 - 99 mg/dL   BUN 30 (H) 6 - 23 mg/dL   Creatinine, Ser 1.35 (H) 0.50 - 1.10 mg/dL   Calcium 9.1 8.4 - 10.5 mg/dL   Total Protein 6.2 6.0 - 8.3 g/dL   Albumin 4.1 3.5 - 5.2 g/dL   AST 25 0 - 37 U/L   ALT 16 0 - 35 U/L   Alkaline Phosphatase 59 39 - 117 U/L   Total Bilirubin 0.4 0.3 - 1.2 mg/dL   GFR calc non Af Amer 36 (L) >90 mL/min   GFR calc Af Amer 42 (L) >90 mL/min   Anion gap 10 5 - 15  Urine Drug Screen  Result Value Ref Range   Opiates NONE DETECTED NONE DETECTED   Cocaine NONE DETECTED NONE DETECTED   Benzodiazepines NONE DETECTED NONE DETECTED   Amphetamines NONE DETECTED NONE DETECTED   Tetrahydrocannabinol NONE DETECTED NONE DETECTED  Barbiturates NONE DETECTED NONE DETECTED  Urinalysis, Routine w reflex microscopic  Result Value Ref Range   Color, Urine YELLOW YELLOW   APPearance CLOUDY (A) CLEAR   Specific Gravity, Urine 1.013 1.005 - 1.030   pH 5.0 5.0 - 8.0   Glucose, UA NEGATIVE NEGATIVE mg/dL   Hgb urine dipstick NEGATIVE NEGATIVE   Bilirubin Urine NEGATIVE NEGATIVE   Ketones, ur NEGATIVE NEGATIVE mg/dL    Protein, ur 30 (A) NEGATIVE mg/dL   Urobilinogen, UA 0.2 0.0 - 1.0 mg/dL   Nitrite NEGATIVE NEGATIVE   Leukocytes, UA TRACE (A) NEGATIVE  Urine microscopic-add on  Result Value Ref Range   Squamous Epithelial / LPF RARE RARE   WBC, UA 3-6 <3 WBC/hpf   Bacteria, UA MANY (A) RARE  I-Stat Chem 8, ED  Result Value Ref Range   Sodium 141 135 - 145 mmol/L   Potassium 4.1 3.5 - 5.1 mmol/L   Chloride 106 96 - 112 mmol/L   BUN 33 (H) 6 - 23 mg/dL   Creatinine, Ser 1.30 (H) 0.50 - 1.10 mg/dL   Glucose, Bld 54 (L) 70 - 99 mg/dL   Calcium, Ion 1.22 1.13 - 1.30 mmol/L   TCO2 22 0 - 100 mmol/L   Hemoglobin 12.6 12.0 - 15.0 g/dL   HCT 37.0 36.0 - 46.0 %  I-Stat Troponin, ED (not at [facility])  Result Value Ref Range   Troponin i, poc 0.00 0.00 - 0.08 ng/mL   Comment 3          CBG monitoring, ED  Result Value Ref Range   Glucose-Capillary 51 (L) 70 - 99 mg/dL   Comment 1 Notify RN   CBG monitoring, ED  Result Value Ref Range   Glucose-Capillary 165 (H) 70 - 99 mg/dL   Comment 1 Notify RN   CBG monitoring, ED  Result Value Ref Range   Glucose-Capillary 70 70 - 99 mg/dL   Comment 1 Notify RN   POC CBG, ED  Result Value Ref Range   Glucose-Capillary 102 (H) 70 - 99 mg/dL  CBG monitoring, ED  Result Value Ref Range   Glucose-Capillary 187 (H) 70 - 99 mg/dL   Comment 1 Notify RN      Imaging Review Ct Head Wo Contrast  02/24/2015   CLINICAL DATA:  Patient with left-sided facial droop and slurred speech. Low blood sugar.  EXAM: CT HEAD WITHOUT CONTRAST  TECHNIQUE: Contiguous axial images were obtained from the base of the skull through the vertex without intravenous contrast.  COMPARISON:  None.  FINDINGS: Ventricles and sulci are appropriate for patient's age. Periventricular and subcortical white matter hypodensity compatible with chronic small vessel ischemic change. No evidence for acute cortically based infarct, intracranial hemorrhage, mass lesion or mass-effect. Orbits are unremarkable.  Paranasal sinuses are unremarkable. Mastoid air cells are well aerated. Calvarium is intact.  IMPRESSION: No acute intracranial process.  Critical Value/emergent results were called by telephone at the time of interpretation on 02/24/2015 at 3:35 pm to Dr. Armida Sans, who verbally acknowledged these results.   Electronically Signed   By: Lovey Newcomer M.D.   On: 02/24/2015 15:43     EKG Interpretation   Date/Time:  Saturday February 24 2015 15:42:00 EDT Ventricular Rate:  72 PR Interval:  175 QRS Duration: 116 QT Interval:  429 QTC Calculation: 469 R Axis:   -56 Text Interpretation:  Sinus rhythm Incomplete left bundle branch block  Anterior Q waves, possibly due to ILBBB No significant change since last  tracing Confirmed by Rogene Houston  MD, [place] [phone]) on 02/24/2015 3:52:12 PM      CRITICAL CARE Performed by: Fredia Sorrow Total critical care time: 30 Critical care time was exclusive of separately billable procedures and treating other patients. Critical care was necessary to treat or prevent imminent or life-threatening deterioration. Critical care was time spent personally by me on the following activities: development of treatment plan with patient and/or surrogate as well as nursing, discussions with consultants, evaluation of patient's response to treatment, examination of patient, obtaining history from patient or surrogate, ordering and performing treatments and interventions, ordering and review of laboratory studies, ordering and review of radiographic studies, pulse oximetry and re-evaluation of patient's condition.  MDM   Final diagnoses:  Hypoglycemia    Patient brought in by EMS as a code stroke. Patient the recently treated with our code stroke protocol with a head CT was negative. Patient was found.markedly hypoglycemic with a blood sugar of 20. Was given D50 did become alert but had trouble speaking. Speech then came around and it was slurred noted to have a left-sided facial  droop. Therefore code stroke was called. Patient was evaluated upon arrival. After head CT and a little bit of left-sided facial droop but that rapidly improved. Seen by our stroke team. Felt that symptoms were most likely due to the market the low blood sugar. Patient observed here for a while was given a snack blood sugar did drop down again to 70 but then with eating came up. Patient only takes her insulin in the morning she took that around 11:00 she sleeps in. She did not have any food. Family members found her collapsed unconscious somewhere around 3:00 in the afternoon.  Patient now without any neuro focal deficits. Head CT was negative. Blood sugars now are up above 100 and she's maintaining him fine patient stable for discharge home.    Fredia Sorrow, MD 02/24/15 [phone]

## 2015-02-24 NOTE — Discharge Instructions (Signed)
Low Blood Sugar Low blood sugar (hypoglycemia) means that the level of sugar in your blood is lower than it should be. Signs of low blood sugar include:  Getting sweaty.  Feeling hungry.  Feeling dizzy or weak.  Feeling sleepier than normal.  Feeling nervous.  Headaches.  Having a fast heartbeat. Low blood sugar can happen fast and can be an emergency. Your doctor can do tests to check your blood sugar level. You can have low blood sugar and not have diabetes. HOME CARE  Check your blood sugar as told by your doctor. If it is less than 70 mg/dl or as told by your doctor, take 1 of the following:  3 to 4 glucose tablets.   cup clear juice.   cup soda pop, not diet.  1 cup milk.  5 to 6 hard candies.  Recheck blood sugar after 15 minutes. Repeat until it is at the right level.  Eat a snack if it is more than 1 hour until the next meal.  Only take medicine as told by your doctor.  Do not skip meals. Eat on time.  Do not drink alcohol except with meals.  Check your blood glucose before driving.  Check your blood glucose before and after exercise.  Always carry treatment with you, such as glucose pills.  Always wear a medical alert bracelet if you have diabetes. GET HELP RIGHT AWAY IF:   Your blood glucose goes below 70 mg/dl or as told by your doctor, and you:  Are confused.  Are not able to swallow.  Pass out (faint).  You cannot treat yourself. You may need someone to help you.  You have low blood sugar problems often.  You have problems from your medicines.  You are not feeling better after 3 to 4 days.  You have vision changes. MAKE SURE YOU:   Understand these instructions.  Will watch this condition.  Will get help right away if you are not doing well or get worse. Document Released: 01/21/2010 Document Revised: 01/19/2012 Document Reviewed: 01/21/2010 [facility] Patient Information 2015 [place], [place]. This information is not intended to  replace advice given to you by your health care provider. Make sure you discuss any questions you have with your health care provider.   As we discussed make sure that you eat some food after taking your insulin. Make sure he had a bedtime snack before going to bed tonight. Follow-up with your doctor in the next few days.

## 2015-02-24 NOTE — ED Notes (Signed)
bair hugger applied.  She passed swallow screen

## 2015-02-24 NOTE — Consult Note (Signed)
Referring Physician: Dr. Rogene Houston    Chief Complaint: code stroke, unresponsiveness, left face droop, dysarthria  HPI:                                                                                                                                         Michelle [name] is an 81 y.o. female with a past medical history significant for dyslipidemia, DM, HTN, COPD, and asthma, brought in by EMS as a code stroke due to acute of the above stated symptoms.  EMS report that she was at home siting in a recliner when suddenly became unresponsive and daughter called EMS. Upon EMS arrival, she had cbg 20 she was given 1 amp D50. She then became more responsive, blood sugar went above 200, and when she started taking was noted to have slurred speech and left face weakness. Upon arrival to ED patient had NIHSS 2 (1 for face, 1 for dysarthria). She was answering questions, no HA, vertigo, double vision, focal arm or leg weakness-numbness, confusion, or visual disturbances. However, after receiving another amp D50 her speech and face normalized. CT brain was personally reviewed and showed no acute intracranial abnormality.  Date last known well: 02/24/15 Time last known well: 1 pm tPA Given: no ,stroke mimic (hypoglicemia) NIHSS: 2, but 0 after receiving 2nd amp D50   Past Medical History  Diagnosis Date  . Type II or unspecified type diabetes mellitus without mention of complication, not stated as uncontrolled   . Dyslipidemia   . Asthma   . Arthritis   . Gallstones   . COPD (chronic obstructive pulmonary disease)   . Interstitial cystitis   . UTI (lower urinary tract infection)   . Malaise and fatigue   . Memory loss   . Coronary atherosclerosis   . Systemic hypertension   . Polymyalgia rheumatica     Past Surgical History  Procedure Laterality Date  . Lumbar disc surgery    . Total abdominal hysterectomy    . Rotator cuff repair    . Tonsillectomy    . Cervical spine surgery    .  Cholecystectomy    . Hernia repair    . Cardiac catheterization  01/29/2006    10% luminal irregularity mid LAD  . Cardiac catheterization  03/15/2001    No significant CAD, no evidence of renal artery stenosis, systemic hypertenion  . US echocardiography  09/14/2007    mild LVH, LA mildly dilated,mild mitral annular calcification, AOV mildly sclerotic, aortic root sclerosis/ca+  . Nm myocar perf wall motion  08/17/2009    normal    Family History  Problem Relation Age of Onset  . Breast cancer Mother   . Heart disease Father   . Lung cancer Brother    Social History:  reports that she has never smoked. She has never used smokeless tobacco. She reports that she does not drink alcohol or use  illicit drugs. Family history: no brain tumors, brain aneurysms, or epilepsy Allergies: No Known Allergies  Medications:                                                                                                                           I have reviewed the patient's current medications.  ROS:                                                                                                                                       History obtained from chart review and daughter  General ROS: negative for - chills, fatigue, fever, night sweats, weight gain or weight loss Psychological ROS: negative for - behavioral disorder, hallucinations, memory difficulties, mood swings or suicidal ideation Ophthalmic ROS: negative for - blurry vision, double vision, eye pain or loss of vision ENT ROS: negative for - epistaxis, nasal discharge, oral lesions, sore throat, tinnitus or vertigo Allergy and Immunology ROS: negative for - hives or itchy/watery eyes Hematological and Lymphatic ROS: negative for - bleeding problems, bruising or swollen lymph nodes Endocrine ROS: negative for - galactorrhea, hair pattern changes, polydipsia/polyuria or temperature intolerance Respiratory ROS: negative for - cough,  hemoptysis, shortness of breath or wheezing Cardiovascular ROS: negative for - chest pain, dyspnea on exertion, edema or irregular heartbeat Gastrointestinal ROS: negative for - abdominal pain, diarrhea, hematemesis, nausea/vomiting or stool incontinence Genito-Urinary ROS: negative for - dysuria, hematuria, incontinence or urinary frequency/urgency Musculoskeletal ROS: negative for - joint swelling or muscular weakness Neurological ROS: as noted in HPI Dermatological ROS: negative for rash and skin lesion changes  Physical exam: pleasant female in no apparent distress.Blood pressure 196/63, pulse 62, temperature 94.4 F (34.7 C), temperature source Rectal, resp. rate 18, SpO2 100 %.  Eyes: anicteric sclerae Head: normocephalic. Neck: supple, no bruits, no JVD. Cardiac: no murmurs. Lungs: clear. Abdomen: soft, no tender, no mass. Extremities: no edema. CV: pulses palpable throughout  Skin: no rash Neurologic Examination:  General: Mental Status: Alert, oriented, thought content appropriate.  Speech fluent without evidence of aphasia.  Able to follow 3 step commands without difficulty. Cranial Nerves: II: Discs flat bilaterally; Visual fields grossly normal, pupils equal, round, reactive to light and accommodation III,IV, VI: ptosis not present, extra-ocular motions intact bilaterally V,VII: smile symmetric, facial light touch sensation normal bilaterally VIII: hearing normal bilaterally IX,X: uvula rises symmetrically XI: bilateral shoulder shrug XII: midline tongue extension without atrophy or fasciculations Motor: Right : Upper extremity   5/5    Left:     Upper extremity   5/5  Lower extremity   5/5     Lower extremity   5/5 Tone and bulk:normal tone throughout; no atrophy noted Sensory: Pinprick and light touch intact throughout, bilaterally Deep Tendon Reflexes:  Right: Upper  Extremity   Left: Upper extremity   biceps (C-5 to C-6) 2/4   biceps (C-5 to C-6) 2/4 tricep (C7) 2/4    triceps (C7) 2/4 Brachioradialis (C6) 2/4  Brachioradialis (C6) 2/4  Lower Extremity Lower Extremity  quadriceps (L-2 to L-4) 2/4   quadriceps (L-2 to L-4) 2/4 Achilles (S1) 2/4   Achilles (S1) 2/4  Plantars: Right: downgoing   Left: downgoing Cerebellar: normal finger-to-nose,  normal heel-to-shin test Gait:  Unable to test due to multiple leads, safety reasons.    Results for orders placed or performed during the hospital encounter of 02/24/15 (from the past 48 hour(s))  CBG monitoring, ED     Status: Abnormal   Collection Time: 02/24/15  3:19 PM  Result Value Ref Range   Glucose-Capillary 51 (L) 70 - 99 mg/dL   Comment 1 Notify RN   Protime-INR     Status: None   Collection Time: 02/24/15  3:23 PM  Result Value Ref Range   Prothrombin Time 13.9 11.6 - 15.2 seconds   INR 1.05 0.00 - 1.49  APTT     Status: None   Collection Time: 02/24/15  3:23 PM  Result Value Ref Range   aPTT 30 24 - 37 seconds  CBC     Status: Abnormal   Collection Time: 02/24/15  3:23 PM  Result Value Ref Range   WBC 7.4 4.0 - 10.5 K/uL   RBC 3.86 (L) 3.87 - 5.11 MIL/uL   Hemoglobin 11.8 (L) 12.0 - 15.0 g/dL   HCT 35.3 (L) 36.0 - 46.0 %   MCV 91.5 78.0 - 100.0 fL   MCH 30.6 26.0 - 34.0 pg   MCHC 33.4 30.0 - 36.0 g/dL   RDW 13.9 11.5 - 15.5 %   Platelets 236 150 - 400 K/uL  Differential     Status: None   Collection Time: 02/24/15  3:23 PM  Result Value Ref Range   Neutrophils Relative % 66 43 - 77 %   Neutro Abs 4.9 1.7 - 7.7 K/uL   Lymphocytes Relative 24 12 - 46 %   Lymphs Abs 1.8 0.7 - 4.0 K/uL   Monocytes Relative 10 3 - 12 %   Monocytes Absolute 0.8 0.1 - 1.0 K/uL   Eosinophils Relative 0 0 - 5 %   Eosinophils Absolute 0.0 0.0 - 0.7 K/uL   Basophils Relative 0 0 - 1 %   Basophils Absolute 0.0 0.0 - 0.1 K/uL  I-Stat Chem 8, ED     Status: Abnormal   Collection Time: 02/24/15   3:29 PM  Result Value Ref Range   Sodium 141 135 - 145 mmol/L   Potassium 4.1 3.5 - 5.1  mmol/L   Chloride 106 96 - 112 mmol/L   BUN 33 (H) 6 - 23 mg/dL   Creatinine, Ser 1.30 (H) 0.50 - 1.10 mg/dL   Glucose, Bld 54 (L) 70 - 99 mg/dL   Calcium, Ion 1.22 1.13 - 1.30 mmol/L   TCO2 22 0 - 100 mmol/L   Hemoglobin 12.6 12.0 - 15.0 g/dL   HCT 37.0 36.0 - 46.0 %  CBG monitoring, ED     Status: Abnormal   Collection Time: 02/24/15  3:39 PM  Result Value Ref Range   Glucose-Capillary 165 (H) 70 - 99 mg/dL   Comment 1 Notify RN   I-Stat Troponin, ED (not at [facility]     Status: None   Collection Time: 02/24/15  3:43 PM  Result Value Ref Range   Troponin i, poc 0.00 0.00 - 0.08 ng/mL   Comment 3            Comment: Due to the release kinetics of cTnI, a negative result within the first hours of the onset of symptoms does not rule out myocardial infarction with certainty. If myocardial infarction is still suspected, repeat the test at appropriate intervals.    Ct Head Wo Contrast  02/24/2015   CLINICAL DATA:  Patient with left-sided facial droop and slurred speech. Low blood sugar.  EXAM: CT HEAD WITHOUT CONTRAST  TECHNIQUE: Contiguous axial images were obtained from the base of the skull through the vertex without intravenous contrast.  COMPARISON:  None.  FINDINGS: Ventricles and sulci are appropriate for patient's age. Periventricular and subcortical white matter hypodensity compatible with chronic small vessel ischemic change. No evidence for acute cortically based infarct, intracranial hemorrhage, mass lesion or mass-effect. Orbits are unremarkable. Paranasal sinuses are unremarkable. Mastoid air cells are well aerated. Calvarium is intact.  IMPRESSION: No acute intracranial process.  Critical Value/emergent results were called by telephone at the time of interpretation on 02/24/2015 at 3:35 pm to Dr. Armida Sans, who verbally acknowledged these results.   Electronically Signed   By: Lovey Newcomer M.D.    On: 02/24/2015 15:43    Assessment: 81 y.o. female with unresponsiveness, dysarthria, and mild left face weakness with cbg 20 IN THE SCENE. NIHSS 2 upon initial evaluation, but non focal exam after receiving a second amp D50. Stroke mimic in the setting of severe hypoglycemia. No further neuro testing needed at this time.   Stroke Risk Factors - age, dyslipidemia, DM, HTN, COPD,  Clois Comber, MD [facility] [phone]  02/24/2015, 4:06 PM

## 2015-02-24 NOTE — ED Notes (Signed)
Per EMS family called out for unconscious patient. On arrival patient CBG 20, amp of D50 administered, patient then became alert but not verbally responsive. 15 minutes later patient begin speaking with slurred speech and left sided facial droop noted. CBG then 258. CBG prior to EMS arrival CBG 86. Patient was not able to complete stroke scale and follow commands for EMS. Upon arrival to facility patient alert and oriented x4, speech mildly slurred, patient has left sided facial droop and now arm or leg drift noted.

## 2015-02-26 ENCOUNTER — Emergency Department (HOSPITAL_COMMUNITY)
Admission: EM | Admit: 2015-02-26 | Discharge: 2015-02-27 | Disposition: A | Discharge status: Home / Self Care | Payer: Medicare Other | Attending: Emergency Medicine | Admitting: Emergency Medicine

## 2015-02-26 ENCOUNTER — Encounter (HOSPITAL_COMMUNITY): Payer: Self-pay | Admitting: Adult Health

## 2015-02-26 DIAGNOSIS — E119 Type 2 diabetes mellitus without complications: Secondary | ICD-10-CM | POA: Diagnosis not present

## 2015-02-26 DIAGNOSIS — Z8719 Personal history of other diseases of the digestive system: Secondary | ICD-10-CM | POA: Diagnosis not present

## 2015-02-26 DIAGNOSIS — J449 Chronic obstructive pulmonary disease, unspecified: Secondary | ICD-10-CM | POA: Insufficient documentation

## 2015-02-26 DIAGNOSIS — Z87440 Personal history of urinary (tract) infections: Secondary | ICD-10-CM | POA: Diagnosis not present

## 2015-02-26 DIAGNOSIS — R112 Nausea with vomiting, unspecified: Secondary | ICD-10-CM | POA: Diagnosis present

## 2015-02-26 DIAGNOSIS — I2510 Atherosclerotic heart disease of native coronary artery without angina pectoris: Secondary | ICD-10-CM | POA: Diagnosis not present

## 2015-02-26 DIAGNOSIS — Z87448 Personal history of other diseases of urinary system: Secondary | ICD-10-CM | POA: Diagnosis not present

## 2015-02-26 DIAGNOSIS — Z794 Long term (current) use of insulin: Secondary | ICD-10-CM | POA: Insufficient documentation

## 2015-02-26 DIAGNOSIS — R197 Diarrhea, unspecified: Secondary | ICD-10-CM | POA: Diagnosis not present

## 2015-02-26 DIAGNOSIS — E860 Dehydration: Secondary | ICD-10-CM | POA: Diagnosis not present

## 2015-02-26 DIAGNOSIS — R1110 Vomiting, unspecified: Secondary | ICD-10-CM | POA: Insufficient documentation

## 2015-02-26 DIAGNOSIS — M1990 Unspecified osteoarthritis, unspecified site: Secondary | ICD-10-CM | POA: Insufficient documentation

## 2015-02-26 DIAGNOSIS — Z9889 Other specified postprocedural states: Secondary | ICD-10-CM | POA: Insufficient documentation

## 2015-02-26 DIAGNOSIS — Z79899 Other long term (current) drug therapy: Secondary | ICD-10-CM | POA: Insufficient documentation

## 2015-02-26 LAB — CBC WITH DIFFERENTIAL/PLATELET
Basophils Absolute: 0 10*3/uL (ref 0.0–0.1)
Basophils Relative: 0 % (ref 0–1)
EOS ABS: 0 10*3/uL (ref 0.0–0.7)
EOS PCT: 0 % (ref 0–5)
HCT: 38.4 % (ref 36.0–46.0)
Hemoglobin: 13 g/dL (ref 12.0–15.0)
Lymphocytes Relative: 6 % — ABNORMAL LOW (ref 12–46)
Lymphs Abs: 1 10*3/uL (ref 0.7–4.0)
MCH: 30.7 pg (ref 26.0–34.0)
MCHC: 33.9 g/dL (ref 30.0–36.0)
MCV: 90.8 fL (ref 78.0–100.0)
Monocytes Absolute: 1.1 10*3/uL — ABNORMAL HIGH (ref 0.1–1.0)
Monocytes Relative: 8 % (ref 3–12)
NEUTROS ABS: 13 10*3/uL — AB (ref 1.7–7.7)
Neutrophils Relative %: 86 % — ABNORMAL HIGH (ref 43–77)
PLATELETS: 272 10*3/uL (ref 150–400)
RBC: 4.23 MIL/uL (ref 3.87–5.11)
RDW: 13.7 % (ref 11.5–15.5)
WBC: 15.2 10*3/uL — AB (ref 4.0–10.5)

## 2015-02-26 LAB — COMPREHENSIVE METABOLIC PANEL
ALT: 25 U/L (ref 0–35)
ANION GAP: 15 (ref 5–15)
AST: 64 U/L — ABNORMAL HIGH (ref 0–37)
Albumin: 4.3 g/dL (ref 3.5–5.2)
Alkaline Phosphatase: 69 U/L (ref 39–117)
BUN: 32 mg/dL — ABNORMAL HIGH (ref 6–23)
CO2: 22 mmol/L (ref 19–32)
Calcium: 10 mg/dL (ref 8.4–10.5)
Chloride: 105 mmol/L (ref 96–112)
Creatinine, Ser: 1.16 mg/dL — ABNORMAL HIGH (ref 0.50–1.10)
GFR calc Af Amer: 50 mL/min — ABNORMAL LOW (ref >90)
GFR, EST NON AFRICAN AMERICAN: 43 mL/min — AB (ref >90)
Glucose, Bld: 188 mg/dL — ABNORMAL HIGH (ref 70–99)
POTASSIUM: 4.8 mmol/L (ref 3.5–5.1)
SODIUM: 142 mmol/L (ref 135–145)
TOTAL PROTEIN: 7.1 g/dL (ref 6.0–8.3)
Total Bilirubin: 0.9 mg/dL (ref 0.3–1.2)

## 2015-02-26 LAB — CBG MONITORING, ED: Glucose-Capillary: 165 mg/dL — ABNORMAL HIGH (ref 70–99)

## 2015-02-26 MED ORDER — ONDANSETRON HCL 4 MG/2ML IJ SOLN
4.0000 mg | Freq: Once | INTRAMUSCULAR | Status: AC
  Administered 2015-02-26: 4 mg via INTRAVENOUS

## 2015-02-26 MED ORDER — ONDANSETRON HCL 4 MG/2ML IJ SOLN
INTRAMUSCULAR | Status: AC
  Filled 2015-02-26: qty 2

## 2015-02-26 NOTE — ED Provider Notes (Signed)
CSN: [id number]     Arrival date & time 02/26/15  2113 History  This chart was scribed for Ripley Fraise, MD by Rayfield Citizen, ED Scribe. This patient was seen in room D30C/D30C and the patient's care was started at 12:12 AM.     Chief Complaint  Patient presents with  . Emesis  . Diarrhea   Patient is a 81 y.o. female presenting with vomiting and diarrhea. The history is provided by the patient, medical records and a relative. No language interpreter was used.  Emesis Severity:  Moderate Duration:  1 day Timing:  Intermittent Quality:  Stomach contents Progression:  Unchanged Chronicity:  New Recent urination:  Normal Context: not post-tussive and not self-induced   Relieved by:  Nothing Worsened by:  Nothing tried Ineffective treatments:  None tried Associated symptoms: diarrhea   Associated symptoms: no abdominal pain, no cough and no fever   Diarrhea Quality:  Watery Severity:  Moderate Associated symptoms: vomiting   Associated symptoms: no abdominal pain and no recent cough      HPI Comments: Michelle [name] is a 81 y.o. female who presents to the Emergency Department complaining of 1 day of nausea, vomiting (multiple episodes, no blood), and diarrhea (multiple episodes, no blood). She reports generalized weakness. She reports a recent antibiotic use; "I have to take an antibiotic before they will see me at the dentist." Patient has taken three Imodium with relief but her vomiting continues. She reports that her symptoms have improved with IV fluids in the ED. She denies fevers, cough, chest pain, abdominal pain, back pain. Patient lives at home alone, but is regularly cared for by her two daughters. She is normally able to ambulate well without assistance. Daughters report they are in the ED tonight due to their concerns regarding patient's blood sugar.   Patient was seen in [facility] ED 3 days PTA for low blood sugar; she was not admitted at that time.   Past Medical History   Diagnosis Date  . Type II or unspecified type diabetes mellitus without mention of complication, not stated as uncontrolled   . Dyslipidemia   . Asthma   . Arthritis   . Gallstones   . COPD (chronic obstructive pulmonary disease)   . Interstitial cystitis   . UTI (lower urinary tract infection)   . Malaise and fatigue   . Memory loss   . Coronary atherosclerosis   . Systemic hypertension   . Polymyalgia rheumatica    Past Surgical History  Procedure Laterality Date  . Lumbar disc surgery    . Total abdominal hysterectomy    . Rotator cuff repair    . Tonsillectomy    . Cervical spine surgery    . Cholecystectomy    . Hernia repair    . Cardiac catheterization  01/29/2006    10% luminal irregularity mid LAD  . Cardiac catheterization  03/15/2001    No significant CAD, no evidence of renal artery stenosis, systemic hypertenion  . US echocardiography  09/14/2007    mild LVH, LA mildly dilated,mild mitral annular calcification, AOV mildly sclerotic, aortic root sclerosis/ca+  . Nm myocar perf wall motion  08/17/2009    normal   Family History  Problem Relation Age of Onset  . Breast cancer Mother   . Heart disease Father   . Lung cancer Brother    History  Substance Use Topics  . Smoking status: Never Smoker   . Smokeless tobacco: Never Used  . Alcohol Use: No  OB History    No data available     Review of Systems  Gastrointestinal: Positive for nausea, vomiting and diarrhea. Negative for abdominal pain.  All other systems reviewed and are negative.  Allergies  Review of patient's allergies indicates no known allergies.  Home Medications   Prior to Admission medications   Medication Sig Start Date End Date Taking? Authorizing Provider  fentaNYL (DURAGESIC - DOSED MCG/HR) 75 MCG/HR Place 75 mcg onto the skin every 3 (three) days.  12/26/14  Yes Historical Provider, MD  gabapentin (NEURONTIN) 100 MG capsule Take 100 mg by mouth daily.  12/15/14  Yes Historical  Provider, MD  HYDROcodone-acetaminophen (NORCO/VICODIN) 5-325 MG per tablet Take 1 tablet by mouth daily.   Yes Historical Provider, MD  insulin lispro (HUMALOG) 100 UNIT/ML injection Inject 0-6 Units into the skin 3 (three) times daily. Patient uses sliding scale   Yes Historical Provider, MD  insulin NPH (HUMULIN N,NOVOLIN N) 100 UNIT/ML injection Inject 30 Units into the skin every morning.    Yes Historical Provider, MD  levothyroxine (SYNTHROID, LEVOTHROID) 88 MCG tablet Take 88 mcg by mouth daily.     Yes Historical Provider, MD  LIVALO 4 MG TABS Take 4 mg by mouth every Monday, Wednesday, and Friday. 3 x a week 02/15/13  Yes Historical Provider, MD  memantine (NAMENDA) 10 MG tablet Take 1 tablet (10 mg total) by mouth 2 (two) times daily. Patient taking differently: Take 10 mg by mouth daily.  02/01/15  Yes Marcial Pacas, MD  Multiple Vitamin (MULTIVITAMIN) tablet Take 1 tablet by mouth daily.     Yes Historical Provider, MD  predniSONE (DELTASONE) 5 MG tablet Take 5 mg by mouth daily.     Yes Historical Provider, MD  telmisartan (MICARDIS) 40 MG tablet Take 1 tablet by mouth daily. 02/10/14  Yes Historical Provider, MD  tiotropium (SPIRIVA HANDIHALER) 18 MCG inhalation capsule Place 1 capsule (18 mcg total) into inhaler and inhale daily. 02/22/15 02/02/17 Yes Deneise Lever, MD  ONE TOUCH ULTRA TEST test strip  01/11/15   Historical Provider, MD   BP 153/65 mmHg  Pulse 64  Temp(Src) 99.7 F (37.6 C) (Oral)  Resp 20  SpO2 100% Physical Exam  Nursing note and vitals reviewed.   CONSTITUTIONAL: Well developed/well nourished HEAD: Normocephalic/atraumatic EYES: EOMI/PERRL ENMT: Dry mucous membranes NECK: supple no meningeal signs SPINE/BACK:entire spine nontender CV: S1/S2 noted, murmur noted LUNGS: Lungs are clear to auscultation bilaterally, no apparent distress ABDOMEN: soft, nontender, no rebound or guarding, bowel sounds noted throughout abdomen GU:no cva tenderness NEURO: Pt is  awake/alert/appropriate, moves all extremitiesx4.  No facial droop.   EXTREMITIES: pulses normal/equal, full ROM SKIN: warm, color normal PSYCH: no abnormalities of mood noted, alert and oriented to situation  ED Course  Procedures   DIAGNOSTIC STUDIES: Oxygen Saturation is 94% on RA, low by my interpretation.    COORDINATION OF CARE: 12:21 AM Discussed treatment plan with pt at bedside and pt agreed to plan.   Pt improved She is taking PO She is ambulatory Suspect viral gastroenteritis Family will take patient home  Labs Review Labs Reviewed  COMPREHENSIVE METABOLIC PANEL - Abnormal; Notable for the following:    Glucose, Bld 188 (*)    BUN 32 (*)    Creatinine, Ser 1.16 (*)    AST 64 (*)    GFR calc non Af Amer 43 (*)    GFR calc Af Amer 50 (*)    All other components within normal limits  CBC WITH DIFFERENTIAL/PLATELET - Abnormal; Notable for the following:    WBC 15.2 (*)    Neutrophils Relative % 86 (*)    Neutro Abs 13.0 (*)    Lymphocytes Relative 6 (*)    Monocytes Absolute 1.1 (*)    All other components within normal limits  CBG MONITORING, ED - Abnormal; Notable for the following:    Glucose-Capillary 165 (*)    All other components within normal limits  CBG MONITORING, ED - Abnormal; Notable for the following:    Glucose-Capillary 267 (*)    All other components within normal limits  I-STAT CG4 LACTIC ACID, ED   Medications  ondansetron (ZOFRAN) injection 4 mg (4 mg Intravenous Given 02/26/15 2254)  sodium chloride 0.9 % bolus 500 mL (0 mLs Intravenous Stopped 02/27/15 0216)     MDM   Final diagnoses:  Vomiting and diarrhea  Dehydration    Nursing notes including past medical history and social history reviewed and considered in documentation Labs/vital reviewed myself and considered during evaluation   I personally performed the services described in this documentation, which was scribed in my presence. The recorded information has been  reviewed and is accurate.       Ripley Fraise, MD 02/27/15 [phone]

## 2015-02-26 NOTE — ED Notes (Signed)
Presents with nausesa, vomiting and diarrhea began last night. CBG 165, pt is drowsy. Unable to hold fluids or food down. Seen here Saturday for low blood sugar. Reports watery diarrhea.

## 2015-02-27 DIAGNOSIS — R1110 Vomiting, unspecified: Secondary | ICD-10-CM | POA: Diagnosis not present

## 2015-02-27 LAB — CBG MONITORING, ED: Glucose-Capillary: 267 mg/dL — ABNORMAL HIGH (ref 70–99)

## 2015-02-27 MED ORDER — SODIUM CHLORIDE 0.9 % IV BOLUS (SEPSIS)
500.0000 mL | Freq: Once | INTRAVENOUS | Status: AC
  Administered 2015-02-27: 500 mL via INTRAVENOUS

## 2015-02-27 NOTE — ED Notes (Signed)
Patient was able to ambulate with her walker without complications.  She was also able to keep her fluids down.

## 2015-02-27 NOTE — ED Notes (Signed)
The patient's daughters are at the bedside.  They advised the patient fell last night and hit her right shin.  She is not complaining of any pain.

## 2015-02-27 NOTE — ED Notes (Signed)
Patient is alert and orientedx4.  Patient was explained discharge instructions and they understood them with no questions.  The patient's daughter, Jannette Spanner is taking the patient home.

## 2015-02-27 NOTE — ED Notes (Signed)
CBG 267 

## 2015-02-27 NOTE — ED Notes (Signed)
Family at bedside. 

## 2015-02-27 NOTE — ED Notes (Signed)
Patient given ginger ale. 

## 2015-03-13 DIAGNOSIS — H524 Presbyopia: Secondary | ICD-10-CM | POA: Diagnosis not present

## 2015-03-13 DIAGNOSIS — H43393 Other vitreous opacities, bilateral: Secondary | ICD-10-CM | POA: Diagnosis not present

## 2015-03-13 DIAGNOSIS — Z961 Presence of intraocular lens: Secondary | ICD-10-CM | POA: Diagnosis not present

## 2015-03-13 DIAGNOSIS — E119 Type 2 diabetes mellitus without complications: Secondary | ICD-10-CM | POA: Diagnosis not present

## 2015-03-13 DIAGNOSIS — H5203 Hypermetropia, bilateral: Secondary | ICD-10-CM | POA: Diagnosis not present

## 2015-03-13 DIAGNOSIS — H3531 Nonexudative age-related macular degeneration: Secondary | ICD-10-CM | POA: Diagnosis not present

## 2015-03-13 DIAGNOSIS — H52223 Regular astigmatism, bilateral: Secondary | ICD-10-CM | POA: Diagnosis not present

## 2015-03-15 DIAGNOSIS — S7010XA Contusion of unspecified thigh, initial encounter: Secondary | ICD-10-CM | POA: Diagnosis not present

## 2015-03-20 ENCOUNTER — Encounter: Payer: Medicare Other | Attending: Surgery | Admitting: Surgery

## 2015-03-20 DIAGNOSIS — J449 Chronic obstructive pulmonary disease, unspecified: Secondary | ICD-10-CM | POA: Diagnosis not present

## 2015-03-20 DIAGNOSIS — E11622 Type 2 diabetes mellitus with other skin ulcer: Secondary | ICD-10-CM | POA: Diagnosis not present

## 2015-03-20 DIAGNOSIS — S81811A Laceration without foreign body, right lower leg, initial encounter: Secondary | ICD-10-CM | POA: Insufficient documentation

## 2015-03-20 DIAGNOSIS — L97812 Non-pressure chronic ulcer of other part of right lower leg with fat layer exposed: Secondary | ICD-10-CM | POA: Insufficient documentation

## 2015-03-20 DIAGNOSIS — X58XXXA Exposure to other specified factors, initial encounter: Secondary | ICD-10-CM | POA: Diagnosis not present

## 2015-03-20 DIAGNOSIS — R600 Localized edema: Secondary | ICD-10-CM | POA: Diagnosis not present

## 2015-03-20 DIAGNOSIS — I10 Essential (primary) hypertension: Secondary | ICD-10-CM | POA: Diagnosis not present

## 2015-03-20 DIAGNOSIS — L97811 Non-pressure chronic ulcer of other part of right lower leg limited to breakdown of skin: Secondary | ICD-10-CM | POA: Diagnosis not present

## 2015-03-20 NOTE — Progress Notes (Signed)
Michelle [name], Michelle [name] ([id number]) Visit Report for 03/20/2015 Abuse/Suicide Risk Screen Details Patient Name: Michelle [name], Michelle [name] 03/20/2015 11:00 Date of Service: AM Medical Record [id number] Number: Patient Account Number: [id number] 1931/11/11 (81 y.o. Treating RN: Montey Hora Date of Birth/Sex: Female) Other Clinician: Primary Care Physician: Macario Carls, CHARLES Treating Britto, Errol Referring Physician: Reed Breech Physician/Extender: Weeks in Treatment: 0 Abuse/Suicide Risk Screen Items Answer ABUSE/SUICIDE RISK SCREEN: Has anyone close to you tried to hurt or harm you recentlyo No Do you feel uncomfortable with anyone in your familyo No Has anyone forced you do things that you didnot want to doo No Do you have any thoughts of harming yourselfo No Patient displays signs or symptoms of abuse and/or neglect. No Electronic Signature(s) Signed: 03/20/2015 4:44:15 PM By: Montey Hora Entered By: Montey Hora on 03/20/2015 16:44:15 Michelle [name] ([id number]) -------------------------------------------------------------------------------- Activities of Daily Living Details Patient Name: Michelle [name], Michelle [name] 03/20/2015 11:00 Date of Service: AM Medical Record [id number] Number: Patient Account Number: [id number] 07-Dec-1931 (81 y.o. Treating RN: Montey Hora Date of Birth/Sex: Female) Other Clinician: Primary Care Physician: Macario Carls, CHARLES Treating Christin Fudge Referring Physician: Reed Breech Physician/Extender: Suella Grove in Treatment: 0 Activities of Daily Living Items Answer Activities of Daily Living (Please select one for each item) Drive Automobile Completely Able Take Medications Completely Able Use Telephone Completely Able Care for Appearance Completely Able Use Toilet Completely Able Bath / Shower Completely Able Dress Self Completely Able Feed Self Completely Able Walk Completely Able Get In / Out Bed Completely Able Housework Completely  Able Prepare Meals Completely Able Handle Money Completely Able Shop for Self Completely Able Electronic Signature(s) Signed: 03/20/2015 4:44:57 PM By: Montey Hora Entered By: Montey Hora on 03/20/2015 16:44:57 Michelle [name] ([id number]) -------------------------------------------------------------------------------- Education Assessment Details Patient Name: Michelle [name] 03/20/2015 11:00 Date of Service: AM Medical Record [id number] Number: Patient Account Number: [id number] Mar 12, 1932 (81 y.o. Treating RN: Montey Hora Date of Birth/Sex: Female) Other Clinician: Primary Care Physician: Macario Carls, CHARLES Treating Christin Fudge Referring Physician: Reed Breech Physician/Extender: Suella Grove in Treatment: 0 Primary Learner Assessed: Patient Learning Preferences/Education Level/Primary Language Learning Preference: Explanation, Demonstration, Printed Material Highest Education Level: High School Preferred Language: Diplomatic Services operational officer Assessment/Beliefs Language Barrier: No Translator Needed: No Memory Deficit: No Emotional Barrier: No Cultural/Religious Beliefs Affecting Medical No Care: Physical Barrier Assessment Impaired Vision: No Impaired Hearing: No Decreased Hand dexterity: No Knowledge/Comprehension Assessment Knowledge Level: Medium Comprehension Level: Medium Ability to understand written Medium instructions: Ability to understand verbal Medium instructions: Motivation Assessment Anxiety Level: Calm Cooperation: Cooperative Education Importance: Acknowledges Need Interest in Health Problems: Asks Questions Perception: Coherent Willingness to Engage in Self- Medium Management Activities: Readiness to Engage in Self- Medium Management Activities: Michelle [name], Michelle [name] ([id number]) Electronic Signature(s) Signed: 03/20/2015 4:45:25 PM By: Montey Hora Entered By: Montey Hora on 03/20/2015 16:45:25 Michelle [name]  ([id number]) -------------------------------------------------------------------------------- Fall Risk Assessment Details Patient Name: Michelle [name] 03/20/2015 11:00 Date of Service: AM Medical Record [id number] Number: Patient Account Number: [id number] 06/11/1932 (80 y.o. Treating RN: Montey Hora Date of Birth/Sex: Female) Other Clinician: Primary Care Physician: Macario Carls, CHARLES Treating Britto, Errol Referring Physician: Reed Breech Physician/Extender: Suella Grove in Treatment: 0 Fall Risk Assessment Items FALL RISK ASSESSMENT: History of falling - immediate or within 3 months 25 Yes Secondary diagnosis 0 No Ambulatory aid None/bed rest/wheelchair/nurse 0 Yes Crutches/cane/walker 0 No Furniture 0 No IV Access/Saline Lock 0 No Gait/Training Normal/bed rest/immobile 0 Yes Weak 0 No Impaired 0 No Mental Status Oriented  to own ability 0 Yes Electronic Signature(s) Signed: 03/20/2015 4:45:40 PM By: Montey Hora Entered By: Montey Hora on 03/20/2015 16:45:39 Michelle [name] ([id number]) -------------------------------------------------------------------------------- Foot Assessment Details Patient Name: Michelle [name], Michelle [name] 03/20/2015 11:00 Date of Service: AM Medical Record [id number] Number: Patient Account Number: [id number] 1932/01/14 (81 y.o. Treating RN: Montey Hora Date of Birth/Sex: Female) Other Clinician: Primary Care Physician: Macario Carls, CHARLES Treating Britto, Errol Referring Physician: Reed Breech Physician/Extender: Suella Grove in Treatment: 0 Foot Assessment Items Site Locations + = Sensation present, - = Sensation absent, C = Callus, U = Ulcer R = Redness, W = Warmth, M = Maceration, PU = Pre-ulcerative lesion F = Fissure, S = Swelling, D = Dryness Assessment Right: Left: Other Deformity: No No Prior Foot Ulcer: No No Prior Amputation: No No Charcot Joint: No No Ambulatory Status: Ambulatory With Help Assistance Device:  Cane Gait: Steady Electronic Signature(s) Signed: 03/20/2015 4:46:12 PM By: Montey Hora Entered By: Montey Hora on 03/20/2015 16:46:12 Michelle [name] ([name] ([id number]) -------------------------------------------------------------------------------- Nutrition Risk Assessment Details Patient Name: Michelle [name] 03/20/2015 11:00 Date of Service: AM Medical Record [id number] Number: Patient Account Number: [id number] August 04, 1932 (81 y.o. Treating RN: Montey Hora Date of Birth/Sex: Female) Other Clinician: Primary Care Physician: Macario Carls, CHARLES Treating Britto, Errol Referring Physician: Reed Breech Physician/Extender: Weeks in Treatment: 0 Height (in): 62 Weight (lbs): 140 Body Mass Index (BMI): 25.6 Nutrition Risk Assessment Items NUTRITION RISK SCREEN: I have an illness or condition that made me change the kind and/or 0 No amount of food I eat I eat fewer than two meals per day 0 No I eat few fruits and vegetables, or milk products 0 No I have three or more drinks of beer, liquor or wine almost every day 0 No I have tooth or mouth problems that make it hard for me to eat 0 No I don't always have enough money to buy the food I need 0 No I eat alone most of the time 0 No I take three or more different prescribed or over-the-counter drugs a 1 Yes day Without wanting to, I have lost or gained 10 pounds in the last six 0 No months I am not always physically able to shop, cook and/or feed myself 0 No Nutrition Protocols Good Risk Protocol Moderate Risk Protocol Electronic Signature(s) Signed: 03/20/2015 4:45:47 PM By: Montey Hora Entered By: Montey Hora on 03/20/2015 16:45:46

## 2015-03-20 NOTE — Progress Notes (Addendum)
DIMITRIA, KETCHUM ([id number]) Visit Report for 03/20/2015 Allergy List Details Patient Name: Michelle [name], Michelle [name]. Date of Service: 03/20/2015 11:00 AM Medical Record Number: [id number] Patient Account Number: [id number] Date of Birth/Sex: 1932-09-19 (82 y.o. Female) Treating RN: Montey Hora Primary Care Physician: Reed Breech Other Clinician: Referring Physician: Reed Breech Treating Physician/Extender: Frann Rider in Treatment: 0 Allergies Active Allergies No Known Drug Allergies Allergy Notes Electronic Signature(s) Signed: 03/20/2015 4:53:00 PM By: Montey Hora Entered By: Montey Hora on 03/20/2015 11:16:53 Michelle [name] ([id number]) -------------------------------------------------------------------------------- Arrival Information Details Patient Name: Michelle [name] Date of Service: 03/20/2015 11:00 AM Medical Record Number: [id number] Patient Account Number: [id number] Date of Birth/Sex: 1932/01/11 (82 y.o. Female) Treating RN: Montey Hora Primary Care Physician: Reed Breech Other Clinician: Referring Physician: Reed Breech Treating Physician/Extender: Frann Rider in Treatment: 0 Visit Information Patient Arrived: Cane Arrival Time: 11:22 Accompanied By: dtr Transfer Assistance: None Patient Identification Verified: Yes Secondary Verification Process Yes Completed: Patient Has Alerts: Yes Patient Alerts: DMII ABI Rackerby >220 Electronic Signature(s) Signed: 04/06/2015 4:13:04 PM By: Montey Hora Previous Signature: 03/20/2015 4:53:00 PM Version By: Montey Hora Entered By: Montey Hora on 04/05/2015 14:34:37 Michelle [name] ([id number]) -------------------------------------------------------------------------------- Clinic Level of Care Assessment Details Patient Name: Michelle [name] Date of Service: 03/20/2015 11:00 AM Medical Record Number: [id number] Patient Account Number: [id number] Date of  Birth/Sex: 29-May-1932 (82 y.o. Female) Treating RN: Junious Dresser Primary Care Physician: Reed Breech Other Clinician: Referring Physician: Reed Breech Treating Physician/Extender: Frann Rider in Treatment: 0 Clinic Level of Care Assessment Items TOOL 1 Quantity Score []  - Use when EandM and Procedure is performed on INITIAL visit 0 ASSESSMENTS - Nursing Assessment / Reassessment []  - General Physical Exam (combine w/ comprehensive assessment (listed just 0 below) when performed on new pt. evals) X - Comprehensive Assessment (HX, ROS, Risk Assessments, Wounds Hx, etc.) 1 25 ASSESSMENTS - Wound and Skin Assessment / Reassessment []  - Dermatologic / Skin Assessment (not related to wound area) 0 ASSESSMENTS - Ostomy and/or Continence Assessment and Care []  - Incontinence Assessment and Management 0 []  - Ostomy Care Assessment and Management (repouching, etc.) 0 PROCESS - Coordination of Care X - Simple Patient / Family Education for ongoing care 1 15 []  - Complex (extensive) Patient / Family Education for ongoing care 0 X - Staff obtains Consents, Records, Test Results / Process Orders 1 10 []  - Staff telephones HHA, Nursing Homes / Clarify orders / etc 0 []  - Routine Transfer to another Facility (non-emergent condition) 0 []  - Routine Hospital Admission (non-emergent condition) 0 X - New Admissions / Biomedical engineer / Ordering NPWT, Apligraf, etc. 1 15 []  - Emergency Hospital Admission (emergent condition) 0 PROCESS - Special Needs []  - Pediatric / Minor Patient Management 0 []  - Isolation Patient Management 0 [name], Michelle C. ([id number]) []  - Hearing / Language / Visual special needs 0 []  - Assessment of Community assistance (transportation, D/C planning, etc.) 0 []  - Additional assistance / Altered mentation 0 []  - Support Surface(s) Assessment (bed, cushion, seat, etc.) 0 INTERVENTIONS - Miscellaneous []  - External ear exam 0 []  - Patient  Transfer (multiple staff / Civil Service fast streamer / Similar devices) 0 []  - Simple Staple / Suture removal (25 or less) 0 []  - Complex Staple / Suture removal (26 or more) 0 []  - Hypo/Hyperglycemic Management (do not check if billed separately) 0 X - Ankle / Brachial Index (ABI) - do not check if billed separately 1  15 Has the patient been seen at the hospital within the last three years: Yes Total Score: 80 Level Of Care: New/Established - Level 3 Electronic Signature(s) Signed: 03/20/2015 4:31:45 PM By: Junious Dresser RN Entered By: Junious Dresser on 03/20/2015 12:21:17 Michelle [name] ([id number]) -------------------------------------------------------------------------------- Encounter Discharge Information Details Patient Name: Michelle [name] Date of Service: 03/20/2015 11:00 AM Medical Record Number: [id number] Patient Account Number: [id number] Date of Birth/Sex: 12-05-1931 (82 y.o. Female) Treating RN: Montey Hora Primary Care Physician: Reed Breech Other Clinician: Referring Physician: Reed Breech Treating Physician/Extender: Frann Rider in Treatment: 0 Encounter Discharge Information Items Schedule Follow-up Appointment: No Medication Reconciliation completed No and provided to Patient/Care Londyn Hotard: Provided on Clinical Summary of Care: 03/20/2015 Form Type Recipient Paper Patient ML Electronic Signature(s) Signed: 03/20/2015 12:30:42 PM By: Ruthine Dose Entered By: Ruthine Dose on 03/20/2015 12:30:42 Michelle [name] ([id number]) -------------------------------------------------------------------------------- Multi Wound Chart Details Patient Name: Michelle [name] Date of Service: 03/20/2015 11:00 AM Medical Record Number: [id number] Patient Account Number: [id number] Date of Birth/Sex: 1931/12/25 (82 y.o. Female) Treating RN: Junious Dresser Primary Care Physician: Reed Breech Other Clinician: Referring Physician: Reed Breech Treating Physician/Extender: Frann Rider in Treatment: 0 Vital Signs Height(in): 62 Capillary Blood 69 Glucose(mg/dl): Weight(lbs): 140 Pulse(bpm): 60 Body Mass Index(BMI): 26 Blood Pressure Temperature(F): 98.3 161/48 (mmHg): Respiratory Rate 18 (breaths/min): Photos: [1:No Photos] [N/A:N/A] Wound Location: [1:Right Lower Leg] [N/A:N/A] Wounding Event: [1:Trauma] [N/A:N/A] Primary Etiology: [1:Trauma, Other] [N/A:N/A] Date Acquired: [1:03/05/2015] [N/A:N/A] Weeks of Treatment: [1:0] [N/A:N/A] Wound Status: [1:Open] [N/A:N/A] Measurements L x W x D 2.4x1.8x1.1 [N/A:N/A] (cm) Area (cm) : [1:3.393] [N/A:N/A] Volume (cm) : [1:3.732] [N/A:N/A] % Reduction in Area: [1:0.00%] [N/A:N/A] % Reduction in Volume: 0.00% [N/A:N/A] Classification: [1:Full Thickness Without Exposed Support Structures] [N/A:N/A] Exudate Amount: [1:Large] [N/A:N/A] Exudate Type: [1:Purulent] [N/A:N/A] Exudate Color: [1:yellow, brown, green] [N/A:N/A] Granulation Amount: [1:Small (1-33%)] [N/A:N/A] Granulation Quality: [1:Red] [N/A:N/A] Necrotic Amount: [1:Large (67-100%)] [N/A:N/A] Necrotic Tissue: [1:Eschar, Adherent Slough] [N/A:N/A] Exposed Structures: [1:Fascia: No Fat: No Tendon: No Muscle: No Joint: No Bone: No] [N/A:N/A] Limited to Skin Breakdown Epithelialization: None N/A N/A Periwound Skin Texture: Edema: Yes N/A N/A Excoriation: No Induration: No Callus: No Crepitus: No Fluctuance: No Friable: No Rash: No Scarring: No Periwound Skin Moist: Yes N/A N/A Moisture: Maceration: No Dry/Scaly: No Periwound Skin Color: Erythema: Yes N/A N/A Atrophie Blanche: No Cyanosis: No Ecchymosis: No Hemosiderin Staining: No Mottled: No Pallor: No Rubor: No Erythema Location: Circumferential N/A N/A Temperature: No Abnormality N/A N/A Tenderness on No N/A N/A Palpation: Wound Preparation: Ulcer Cleansing: N/A N/A Rinsed/Irrigated with Saline Topical  Anesthetic Applied: Other: lidocaine 4% Treatment Notes Electronic Signature(s) Signed: 03/20/2015 4:31:45 PM By: Junious Dresser RN Entered By: Junious Dresser on 03/20/2015 12:15:28 Michelle [name] ([id number]) -------------------------------------------------------------------------------- [place] Details Patient Name: Michelle [name] Date of Service: 03/20/2015 11:00 AM Medical Record Number: [id number] Patient Account Number: [id number] Date of Birth/Sex: 1932/08/24 (82 y.o. Female) Treating RN: Junious Dresser Primary Care Physician: Reed Breech Other Clinician: Referring Physician: Reed Breech Treating Physician/Extender: Frann Rider in Treatment: 0 Active Inactive Abuse / Safety / Falls / Self Care Management Nursing Diagnoses: Impaired physical mobility Potential for falls Goals: Patient will remain injury free Date Initiated: 03/20/2015 Goal Status: Active Patient/caregiver will verbalize understanding of skin care regimen Date Initiated: 03/20/2015 Goal Status: Active Patient/caregiver will verbalize/demonstrate measures taken to prevent injury and/or falls Date Initiated: 03/20/2015 Goal Status: Active Patient/caregiver will verbalize/demonstrate understanding of what to do  in case of emergency Date Initiated: 03/20/2015 Goal Status: Active Interventions: Assess fall risk on admission and as needed Provide education on fall prevention Provide education on safe transfers Notes: Nutrition Nursing Diagnoses: Potential for alteratiion in Nutrition/Potential for imbalanced nutrition Goals: Patient/caregiver verbalizes understanding of need to maintain therapeutic glucose control per primary care physician Date Initiated: 03/20/2015 Michelle [name], Michelle [name] ([id number]) Goal Status: Active Patient/caregiver will maintain therapeutic glucose control Date Initiated: 03/20/2015 Goal Status: Active Interventions: Assess HgA1c results as  ordered upon admission and as needed Provide education on elevated blood sugars and impact on wound healing Provide education on nutrition Treatment Activities: Obtain HgA1c : 03/20/2015 Notes: Orientation to the Wound Care Program Nursing Diagnoses: Knowledge deficit related to the wound healing center program Goals: Patient/caregiver will verbalize understanding of the [place] Program Date Initiated: 03/20/2015 Goal Status: Active Interventions: Provide education on orientation to the wound center Notes: Wound/Skin Impairment Nursing Diagnoses: Impaired tissue integrity Knowledge deficit related to ulceration/compromised skin integrity Goals: Patient/caregiver will verbalize understanding of skin care regimen Date Initiated: 03/20/2015 Goal Status: Active Ulcer/skin breakdown will heal within 14 weeks Date Initiated: 03/20/2015 Goal Status: Active Interventions: Assess patient/caregiver ability to obtain necessary supplies Assess patient/caregiver ability to perform ulcer/skin care regimen upon admission and as needed Michelle [name], Michelle [name] ([id number]) Assess ulceration(s) every visit Provide education on ulcer and skin care Notes: Electronic Signature(s) Signed: 03/20/2015 4:31:45 PM By: Junious Dresser RN Entered By: Junious Dresser on 03/20/2015 12:14:45 Michelle [name] ([id number]) -------------------------------------------------------------------------------- Patient/Caregiver Education Details Patient Name: Michelle [name] Date of Service: 03/20/2015 11:00 AM Medical Record Number: [id number] Patient Account Number: [id number] Date of Birth/Gender: 1932/07/12 (81 y.o. Female) Treating RN: Montey Hora Primary Care Physician: Reed Breech Other Clinician: Referring Physician: Reed Breech Treating Physician/Extender: Frann Rider in Treatment: 0 Education Assessment Education Provided To: Patient and Caregiver Education Topics  Provided Wound/Skin Impairment: Handouts: Other: wound care as ordered Methods: Demonstration, Explain/Verbal Responses: State content correctly Electronic Signature(s) Signed: 03/20/2015 4:46:55 PM By: Montey Hora Entered By: Montey Hora on 03/20/2015 16:46:55 Michelle [name] ([id number]) -------------------------------------------------------------------------------- Wound Assessment Details Patient Name: Michelle [name] Date of Service: 03/20/2015 11:00 AM Medical Record Number: [id number] Patient Account Number: [id number] Date of Birth/Sex: Nov 22, 1931 (82 y.o. Female) Treating RN: Montey Hora Primary Care Physician: Reed Breech Other Clinician: Referring Physician: Reed Breech Treating Physician/Extender: Frann Rider in Treatment: 0 Wound Status Wound Number: 1 Primary Etiology: Trauma, Other Wound Location: Right Lower Leg Wound Status: Open Wounding Event: Trauma Date Acquired: 03/05/2015 Weeks Of Treatment: 0 Clustered Wound: No Photos Photo Uploaded By: Montey Hora on 03/20/2015 16:33:35 Wound Measurements Length: (cm) 2.4 Width: (cm) 1.8 Depth: (cm) 1.1 Area: (cm) 3.393 Volume: (cm) 3.732 % Reduction in Area: 0% % Reduction in Volume: 0% Epithelialization: None Tunneling: No Undermining: No Wound Description Full Thickness Without Exposed Classification: Support Structures Exudate Large Amount: Exudate Type: Purulent Exudate Color: yellow, brown, green Foul Odor After Cleansing: No Wound Bed Granulation Amount: Small (1-33%) Exposed Structure Granulation Quality: Red Fascia Exposed: No Necrotic Amount: Large (67-100%) Fat Layer Exposed: No Necrotic Quality: Eschar, Adherent Slough Tendon Exposed: No Michelle [name], Michelle C. ([id number]) Muscle Exposed: No Joint Exposed: No Bone Exposed: No Limited to Skin Breakdown Periwound Skin Texture Texture Color No Abnormalities Noted: No No Abnormalities Noted:  No Callus: No Atrophie Blanche: No Crepitus: No Cyanosis: No Excoriation: No Ecchymosis: No Fluctuance: No Erythema: Yes Friable: No Erythema Location: Circumferential Induration: No Hemosiderin Staining: No Localized Edema:  Yes Mottled: No Rash: No Pallor: No Scarring: No Rubor: No Moisture Temperature / Pain No Abnormalities Noted: No Temperature: No Abnormality Dry / Scaly: No Maceration: No Moist: Yes Wound Preparation Ulcer Cleansing: Rinsed/Irrigated with Saline Topical Anesthetic Applied: Other: lidocaine 4%, Electronic Signature(s) Signed: 03/20/2015 4:53:00 PM By: Montey Hora Entered By: Montey Hora on 03/20/2015 11:41:39 Michelle [name] ([id number]) -------------------------------------------------------------------------------- Vitals Details Patient Name: Michelle [name] Date of Service: 03/20/2015 11:00 AM Medical Record Number: [id number] Patient Account Number: [id number] Date of Birth/Sex: [birth year] (82 y.o. Female) Treating RN: Montey Hora Primary Care Physician: Reed Breech Other Clinician: Referring Physician: Reed Breech Treating Physician/Extender: Frann Rider in Treatment: 0 Vital Signs Time Taken: 11:24 Temperature (F): 98.3 Height (in): 62 Pulse (bpm): 60 Source: Stated Respiratory Rate (breaths/min): 18 Weight (lbs): 140 Blood Pressure (mmHg): 161/48 Source: Stated Capillary Blood Glucose (mg/dl): 69 Body Mass Index (BMI): 25.6 Reference Range: 80 - 120 mg / dl Electronic Signature(s) Signed: 03/20/2015 4:53:00 PM By: Montey Hora Entered By: Montey Hora on 03/20/2015 11:27:13

## 2015-03-22 NOTE — Progress Notes (Signed)
GEONNA, LOCKYER ([id number]) Visit Report for 03/20/2015 Chief Complaint Document Details Patient Name: Michelle [name], Michelle [name] 03/20/2015 11:00 Date of Service: AM Medical Record [id number] Number: Patient Account Number: [id number] 11/21/1931 (81 y.o. Treating RN: Montey Hora Date of Birth/Sex: Female) Other Clinician: Primary Care Physician: Macario Carls, CHARLES Treating Christin Fudge Referring Physician: Reed Breech Physician/Extender: Suella Grove in Treatment: 0 Information Obtained from: Patient Chief Complaint Patient presents to the wound care center for a consult due non healing wound. Pleasant 81 year old, comes with a injury to her right lower extremity which has been there for about 2 weeks. Electronic Signature(s) Signed: 03/20/2015 2:25:04 PM By: Christin Fudge MD, FACS Entered By: Christin Fudge on 03/20/2015 13:44:34 Michelle [name] ([id number]) -------------------------------------------------------------------------------- Debridement Details Patient Name: Michelle [name] 03/20/2015 11:00 Date of Service: AM Medical Record [id number] Number: Patient Account Number: [id number] 1932-03-08 (81 y.o. Treating RN: Montey Hora Date of Birth/Sex: Female) Other Clinician: Primary Care Physician: Macario Carls, CHARLES Treating Lynden Carrithers Referring Physician: Reed Breech Physician/Extender: Weeks in Treatment: 0 Debridement Performed for Wound #1 Right Lower Leg Assessment: Performed By: Physician Pat Patrick., MD Debridement: Debridement Pre-procedure Yes Verification/Time Out Taken: Start Time: 12:17 Pain Control: Lidocaine 4% Topical Solution Level: Skin/Subcutaneous Tissue Total Area Debrided (L x 2.4 (cm) x 1.8 (cm) = 4.32 (cm) W): Tissue and other Non-Viable, Exudate, Fibrin/Slough, Skin, Subcutaneous material debrided: Instrument: Forceps, Scissors Bleeding: Minimum Hemostasis Achieved: Pressure End Time: 12:19 Procedural Pain:  2 Post Procedural Pain: 0 Response to Treatment: Procedure was tolerated well Post Debridement Measurements of Total Wound Length: (cm) 2.4 Width: (cm) 1.8 Depth: (cm) 1.1 Volume: (cm) 3.732 Electronic Signature(s) Signed: 03/20/2015 2:25:04 PM By: Christin Fudge MD, FACS Signed: 03/20/2015 4:53:00 PM By: Montey Hora Entered By: Christin Fudge on 03/20/2015 13:43:48 Michelle [name] ([id number]) -------------------------------------------------------------------------------- HPI Details Patient Name: Michelle [name] 03/20/2015 11:00 Date of Service: AM Medical Record [id number] Number: Patient Account Number: [id number] [birth year] (81 y.o. Treating RN: Montey Hora Date of Birth/Sex: Female) Other Clinician: Primary Care Physician: Macario Carls, CHARLES Treating Christin Fudge Referring Physician: Reed Breech Physician/Extender: Weeks in Treatment: 0 History of Present Illness Location: right lower extremity Quality: Patient reports experiencing a dull pain to affected area(s). Severity: Patient states wound are getting worse. Duration: Patient has had the wound for < 2 weeks prior to presenting for treatment Timing: Pain in wound is Intermittent (comes and goes Context: The wound occurred when the patient had her door slammed into her right leg while she was at home about 2 weeks ago. Modifying Factors: Consults to this date include: application of hydrogen peroxide and Neosporin. Associated Signs and Symptoms: Patient reports having difficulty standing for long periods. HPI Description: this pleasant 81 year old who has been known to be a diabetic for the last 30 years has been injured with a door slamming into her leg about 2 weeks ago. The patient is a poor historian and we could not get many records from her family practice doctor's office. She has been new using hydrogen peroxide and Neosporin on the wound and says that she squeezed out the scab and some  pus. other comorbidities include high blood pressure and hypothyroidism. She says she checks her blood sugar everyday and it has been running fairly good and her last hemoglobin A1c done in November 2015 was 7.4. She has had no x-rays of her leg done nor she had any significant problems in the recent past. Electronic Signature(s) Signed: 03/20/2015 2:25:04 PM By: Christin Fudge MD,  FACS Entered By: Christin Fudge on 03/20/2015 13:48:29 Michelle [name] ([id number]) -------------------------------------------------------------------------------- Physical Exam Details Patient Name: Michelle [name], Michelle [name] 03/20/2015 11:00 Date of Service: AM Medical Record [id number] Number: Patient Account Number: [id number] 10/15/1932 (81 y.o. Treating RN: Montey Hora Date of Birth/Sex: Female) Other Clinician: Primary Care Physician: Macario Carls, CHARLES Treating Christin Fudge Referring Physician: Reed Breech Physician/Extender: Weeks in Treatment: 0 Constitutional . Pulse regular. Respirations normal and unlabored. Afebrile. . Eyes Nonicteric. Reactive to light. Ears, Nose, Mouth, and Throat Lips, teeth, and gums WNL.Marland Kitchen Moist mucosa without lesions . Neck supple and nontender. No palpable supraclavicular or cervical adenopathy. Normal sized without goiter. Respiratory WNL. No retractions.. Cardiovascular Pedal Pulses WNL. ABIs were not measured because the arteries are not compressible.. No clubbing, cyanosis or but has mild edema of the lower extremities.. Gastrointestinal (GI) Abdomen without masses or tenderness.. No liver or spleen enlargement or tenderness.. Musculoskeletal Adexa without tenderness or enlargement.. Digits and nails w/o clubbing, cyanosis, infection, petechiae, ischemia, or inflammatory conditions.. Integumentary (Hair, Skin) she has a punched-out ulcerated area on the mid third of her right lower extremity on the medial aspect. There is some slough and debris towards  the edges and the depth.. No crepitus or fluctuance. No peri- wound warmth or erythema. No masses.Marland Kitchen Psychiatric Judgement and insight Intact.. No evidence of depression, anxiety, or agitation.. Electronic Signature(s) Signed: 03/20/2015 2:25:04 PM By: Christin Fudge MD, FACS Entered By: Christin Fudge on 03/20/2015 13:50:08 Michelle [name] ([id number]) -------------------------------------------------------------------------------- Physician Orders Details Patient Name: Michelle [name], Michelle [name] 03/20/2015 11:00 Date of Service: AM Medical Record [id number] Number: Patient Account Number: [id number] 04/24/1932 (81 y.o. Treating RN: Junious Dresser Date of Birth/Sex: Female) Other Clinician: Primary Care Physician: Macario Carls, CHARLES Treating Christin Fudge Referring Physician: Reed Breech Physician/Extender: Suella Grove in Treatment: 0 Verbal / Phone Orders: Yes Clinician: Junious Dresser Read Back and Verified: Yes Diagnosis Coding Wound Cleansing Wound #1 Right Lower Leg o Clean wound with Normal Saline. Anesthetic Wound #1 Right Lower Leg o Topical Lidocaine 4% cream applied to wound bed prior to debridement Primary Wound Dressing Wound #1 Right Lower Leg o Aquacel Ag Secondary Dressing Wound #1 Right Lower Leg o Boardered Foam Dressing Dressing Change Frequency Wound #1 Right Lower Leg o Change dressing every other day. Follow-up Appointments Wound #1 Right Lower Leg o Return Appointment in 1 week. Edema Control Wound #1 Right Lower Leg o Tubigrip Electronic Signature(s) Signed: 03/20/2015 2:25:04 PM By: Christin Fudge MD, FACS Signed: 03/20/2015 4:31:45 PM By: Junious Dresser RN Michelle [name], Michelle [name] ([id number]) Entered By: Junious Dresser on 03/20/2015 12:20:49 Michelle [name] ([id number]) -------------------------------------------------------------------------------- Problem List Details Patient Name: Michelle [name], Michelle [name] 03/20/2015 11:00 Date of Service: AM Medical  Record [id number] Number: Patient Account Number: [id number] 01-Feb-1932 (81 y.o. 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 Treating RN: Montey Hora Date of Birth/Sex: Female) Other Clinician: Primary Care Physician: Macario Carls, CHARLES Treating Christin Fudge Referring Physician: Reed Breech Physician/Extender: Suella Grove in Treatment: 0 Subjective Chief Complaint Information obtained from Patient Patient presents to the wound care center for a consult due non healing wound. Pleasant 81 year old, comes with a injury to her right lower extremity which has been there for about 2 weeks. History of Present Illness (HPI) The following HPI elements were documented for the patient's wound: Location: right lower extremity Quality: Patient reports experiencing a dull pain to affected area(s). Severity: Patient states wound are getting worse. Duration: Patient has had the wound for < 2 weeks prior to presenting for  treatment Timing: Pain in wound is Intermittent (comes and goes Context: The wound occurred when the patient had her door slammed into her right leg while she was at home about 2 weeks ago. Modifying Factors: Consults to this date include: application of hydrogen peroxide and Neosporin. Associated Signs and Symptoms: Patient reports having difficulty standing for long periods. this pleasant 81 year old who has been known to be a diabetic for the last 30 years has been injured with a door slamming into her leg about 2 weeks ago. The patient is a poor historian and we could not get many records from her family practice doctor's office. She has been new using hydrogen peroxide and Neosporin on the wound and says that she squeezed out the scab and some pus. other comorbidities include high blood pressure and hypothyroidism. She says she checks her blood sugar everyday and it has been running fairly good and her last hemoglobin A1c done in November 2015 was 7.4. She has had no x-rays of her leg done nor she had any significant problems in the recent past. Wound History Patient presents with 1 open wound that has been present for approximately 2 weeks. Patient has been treating wound in the following manner: peroxide and neosporin. Laboratory tests have not been performed in the last month. Patient reportedly has not tested positive for an antibiotic resistant organism. Patient reportedly has not tested positive for osteomyelitis. Patient reportedly has not had testing performed to evaluate circulation in the legs. Patient experiences the following problems associated with their wounds: swelling. Patient History Information obtained from Patient. Michelle [name], Michelle [name] ([id number]) Allergies No Known Drug Allergies Family History Cancer - Mother, Diabetes - Child, Heart Disease - Father, Hypertension - Father, No family history of Hereditary Spherocytosis, Kidney Disease, Lung Disease, Seizures,  Stroke, Thyroid Problems, Tuberculosis. Social History Never smoker, Marital Status - Widowed, Alcohol Use - Never, Drug Use - No History, Caffeine Use - Moderate. Medical History Ear/Nose/Mouth/Throat Denies history of Chronic sinus problems/congestion, Middle ear problems Hematologic/Lymphatic Denies history of Anemia, Hemophilia, Human Immunodeficiency Virus, Lymphedema, Sickle Cell Disease Respiratory Patient has history of Chronic Obstructive Pulmonary Disease (COPD) Denies history of Aspiration, Asthma, Pneumothorax, Sleep Apnea, Tuberculosis Cardiovascular Patient has history of Arrhythmia - bradycardia, Hypertension Denies history of Angina, Congestive Heart Failure, Coronary Artery Disease, Deep Vein Thrombosis, Hypotension, Myocardial Infarction, Peripheral Arterial Disease, Peripheral Venous Disease, Phlebitis, Vasculitis Gastrointestinal Denies history of Cirrhosis , Colitis, Crohn s, Hepatitis A, Hepatitis B, Hepatitis C Endocrine Patient has history of Type II Diabetes Denies history of Type I Diabetes Genitourinary Denies history of End Stage Renal Disease Immunological Denies history of Lupus Erythematosus, Raynaud s, Scleroderma Integumentary (Skin) Denies history of History of Burn, History of pressure wounds Musculoskeletal Denies history of Gout, Rheumatoid Arthritis, Osteoarthritis, Osteomyelitis Neurologic  Denies history of Dementia, Neuropathy, Quadriplegia, Paraplegia, Seizure Disorder Oncologic Denies history of Received Chemotherapy, Received Radiation Psychiatric Denies history of Anorexia/bulimia, Confinement Anxiety Patient is treated with Insulin. Blood sugar is tested. Review of Systems (ROS) Constitutional Symptoms (General Health) Michelle [name], Michelle C. ([id number]) The patient has no complaints or symptoms. Eyes The patient has no complaints or symptoms. Ear/Nose/Mouth/Throat The patient has no complaints or symptoms. Hematologic/Lymphatic The  patient has no complaints or symptoms. Respiratory The patient has no complaints or symptoms. Cardiovascular The patient has no complaints or symptoms. Gastrointestinal The patient has no complaints or symptoms. Endocrine The patient has no complaints or symptoms. Genitourinary The patient has no complaints or symptoms. Immunological The patient has no complaints or symptoms. Integumentary (Skin) Complains or has symptoms of Wounds - trauma wound 10 years ago. Denies complaints or symptoms of Bleeding or bruising tendency, Breakdown, Swelling. Musculoskeletal The patient has no complaints or symptoms. Neurologic The patient has no complaints or symptoms. Oncologic The patient has no complaints or symptoms. Psychiatric The patient has no complaints or symptoms. Objective Constitutional Pulse regular. Respirations normal and unlabored. Afebrile. Vitals Time Taken: 11:24 AM, Height: 62 in, Source: Stated, Weight: 140 lbs, Source: Stated, BMI: 25.6, Temperature: 98.3 F, Pulse: 60 bpm, Respiratory Rate: 18 breaths/min, Blood Pressure: 161/48 mmHg, Capillary Blood Glucose: 69 mg/dl. Eyes Nonicteric. Reactive to light. Ears, Nose, Mouth, and Throat Michelle [name], Michelle C. ([id number]) Lips, teeth, and gums WNL.Marland Kitchen Moist mucosa without lesions . Neck supple and nontender. No palpable supraclavicular or cervical adenopathy. Normal sized without goiter. Respiratory WNL. No retractions.. Cardiovascular Pedal Pulses WNL. ABIs were not measured because the arteries are not compressible.. No clubbing, cyanosis or but has mild edema of the lower extremities.. Gastrointestinal (GI) Abdomen without masses or tenderness.. No liver or spleen enlargement or tenderness.. Musculoskeletal Adexa without tenderness or enlargement.. Digits and nails w/o clubbing, cyanosis, infection, petechiae, ischemia, or inflammatory conditions.Marland Kitchen Psychiatric Judgement and insight Intact.. No evidence of depression,  anxiety, or agitation.. Integumentary (Hair, Skin) she has a punched-out ulcerated area on the mid third of her right lower extremity on the medial aspect. There is some slough and debris towards the edges and the depth.. No crepitus or fluctuance. No peri- wound warmth or erythema. No masses.. Wound #1 status is Open. Original cause of wound was Trauma. The wound is located on the Right Lower Leg. The wound measures 2.4cm length x 1.8cm width x 1.1cm depth; 3.393cm^2 area and 3.732cm^3 volume. The wound is limited to skin breakdown. There is no tunneling or undermining noted. There is a large amount of purulent drainage noted. There is small (1-33%) red granulation within the wound bed. There is a large (67-100%) amount of necrotic tissue within the wound bed including Eschar and Adherent Slough. The periwound skin appearance exhibited: Localized Edema, Moist, Erythema. The periwound skin appearance did not exhibit: Callus, Crepitus, Excoriation, Fluctuance, Friable, Induration, Rash, Scarring, Dry/Scaly, Maceration, Atrophie Blanche, Cyanosis, Ecchymosis, Hemosiderin Staining, Mottled, Pallor, Rubor. The surrounding wound skin color is noted with erythema which is circumferential. Periwound temperature was noted as No Abnormality. She has a punched-out ulcerated area on the mid third of her right lower extremity on the medial aspect. There is some slough and debris towards the edges and the depth. Assessment Michelle [name], Michelle [name] ([id number]) Active Problems ICD-10 E11.622 - Type 2 diabetes mellitus with other skin ulcer L97.812 - Non-pressure chronic ulcer of other part of right lower leg with fat layer exposed S81.811A - Laceration without foreign body, right lower  leg, initial encounter Diagnoses ICD-10 E11.622: Type 2 diabetes mellitus with other skin ulcer L97.812: Non-pressure chronic ulcer of other part of right lower leg with fat layer exposed S81.811A: Laceration without foreign  body, right lower leg, initial encounter This is an elderly diabetic patient who has had a lacerated wound to the right lower extremity which has resulted in a fairly deep wound and this is possibly due to the lack of treatment over the last 2 weeks. I have recommended packing the wound with silver alginate and applying a light compression over this. She has been instructed in wound care and her daughter will help her out of this. She will also control her diabetes as well as possible and continued to ambulate well. she understands that she'll come to see me on a weekly basis and we will make appropriate recommendation. Procedures Wound #1 Wound #1 is a Trauma, Other located on the Right Lower Leg . There was a Skin/Subcutaneous Tissue Debridement ([id number]) debridement with total area of 4.32 sq cm performed by Assunta Pupo, Jackson Latino., MD. with the following instrument(s): Forceps and Scissors to remove Non-Viable tissue/material including Exudate, Fibrin/Slough, Skin, and Subcutaneous after achieving pain control using Lidocaine 4% Topical Solution. A time out was conducted prior to the start of the procedure. A Minimum amount of bleeding was controlled with Pressure. The procedure was tolerated well with a pain level of 2 throughout and a pain level of 0 following the procedure. Post Debridement Measurements: 2.4cm length x 1.8cm width x 1.1cm depth; 3.732cm^3 volume. Plan Wound Cleansing: Wound #1 Right Lower Leg: Dunbar, Kaytlyn C. ([id number]) Clean wound with Normal Saline. Anesthetic: Wound #1 Right Lower Leg: Topical Lidocaine 4% cream applied to wound bed prior to debridement Primary Wound Dressing: Wound #1 Right Lower Leg: Aquacel Ag Secondary Dressing: Wound #1 Right Lower Leg: Boardered Foam Dressing Dressing Change Frequency: Wound #1 Right Lower Leg: Change dressing every other day. Follow-up Appointments: Wound #1 Right Lower Leg: Return Appointment in 1 week. Edema  Control: Wound #1 Right Lower Leg: Tubigrip This is an elderly diabetic patient who has had a lacerated wound to the right lower extremity which has resulted in a fairly deep wound and this is possibly due to the lack of treatment over the last 2 weeks. I have recommended packing the wound with silver alginate and applying a light compression over this. She has been instructed in wound care and her daughter will help her out of this. She will also control her diabetes as well as possible and continued to ambulate well. she understands that she'll come to see me on a weekly basis and we will make appropriate recommendation. Electronic Signature(s) Signed: 03/21/2015 10:05:09 AM By: Junious Dresser RN Signed: 03/21/2015 5:24:37 PM By: Christin Fudge MD, FACS Previous Signature: 03/20/2015 2:25:04 PM Version By: Christin Fudge MD, FACS Entered By: Junious Dresser on 03/21/2015 10:05:09 Michelle [name] ([id number]) -------------------------------------------------------------------------------- ROS/PFSH Details Patient Name: Michelle [name], Michelle [name] 03/20/2015 11:00 Date of Service: AM Medical Record [id number] Number: Patient Account Number: [id number] [birth year] (82 y.o. Treating RN: Montey Hora Date of Birth/Sex: Female) Other Clinician: Primary Care Physician: Macario Carls, CHARLES Treating Christin Fudge Referring Physician: Reed Breech Physician/Extender: Suella Grove in Treatment: 0 Information Obtained From Patient Wound History Do you currently have one or more open woundso Yes How many open wounds do you currently haveo 1 Approximately how long have you had your woundso 2 weeks How have you been treating your wound(s) until nowo peroxide and neosporin Has your  wound(s) ever healed and then re-openedo No Have you had any lab work done in the past montho No Have you tested positive for an antibiotic resistant organism (MRSA, VRE)o No Have you tested positive for osteomyelitis (bone  infection)o No Have you had any tests for circulation on your legso No Have you had other problems associated with your woundso Swelling Integumentary (Skin) Complaints and Symptoms: Positive for: Wounds - trauma wound 10 years ago Negative for: Bleeding or bruising tendency; Breakdown; Swelling Medical History: Negative for: History of Burn; History of pressure wounds Constitutional Symptoms (General Health) Complaints and Symptoms: No Complaints or Symptoms Eyes Complaints and Symptoms: No Complaints or Symptoms Ear/Nose/Mouth/Throat Complaints and Symptoms: No Complaints or Symptoms Medical HistoryLINET, Michelle [name] ([id number]) Negative for: Chronic sinus problems/congestion; Middle ear problems Hematologic/Lymphatic Complaints and Symptoms: No Complaints or Symptoms Medical History: Negative for: Anemia; Hemophilia; Human Immunodeficiency Virus; Lymphedema; Sickle Cell Disease Respiratory Complaints and Symptoms: No Complaints or Symptoms Medical History: Positive for: Chronic Obstructive Pulmonary Disease (COPD) Negative for: Aspiration; Asthma; Pneumothorax; Sleep Apnea; Tuberculosis Cardiovascular Complaints and Symptoms: No Complaints or Symptoms Medical History: Positive for: Arrhythmia - bradycardia; Hypertension Negative for: Angina; Congestive Heart Failure; Coronary Artery Disease; Deep Vein Thrombosis; Hypotension; Myocardial Infarction; Peripheral Arterial Disease; Peripheral Venous Disease; Phlebitis; Vasculitis Gastrointestinal Complaints and Symptoms: No Complaints or Symptoms Medical History: Negative for: Cirrhosis ; Colitis; Crohnos; Hepatitis A; Hepatitis B; Hepatitis C Endocrine Complaints and Symptoms: No Complaints or Symptoms Medical History: Positive for: Type II Diabetes Negative for: Type I Diabetes Time with diabetes: 25 years Treated with: Insulin Blood sugar tested every day: Yes Tested : TID Michelle [name], Yamilka C.  ([id number]) Genitourinary Complaints and Symptoms: No Complaints or Symptoms Medical History: Negative for: End Stage Renal Disease Immunological Complaints and Symptoms: No Complaints or Symptoms Medical History: Negative for: Lupus Erythematosus; Raynaudos; Scleroderma Musculoskeletal Complaints and Symptoms: No Complaints or Symptoms Medical History: Negative for: Gout; Rheumatoid Arthritis; Osteoarthritis; Osteomyelitis Neurologic Complaints and Symptoms: No Complaints or Symptoms Medical History: Negative for: Dementia; Neuropathy; Quadriplegia; Paraplegia; Seizure Disorder Oncologic Complaints and Symptoms: No Complaints or Symptoms Medical History: Negative for: Received Chemotherapy; Received Radiation Psychiatric Complaints and Symptoms: No Complaints or Symptoms Medical History: Negative for: Anorexia/bulimia; Confinement Anxiety Family and Social History Cancer: Yes - Mother; Diabetes: Yes - Child; Heart Disease: Yes - Father; Hereditary Spherocytosis: No; Hypertension: Yes - Father; Kidney Disease: No; Lung Disease: No; Seizures: No; Stroke: No; Thyroid Problems: No; Tuberculosis: No; Never smoker; Marital Status - Widowed; Alcohol Use: Never; Drug Use: Selley, Zamoria C. 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 Female) Treating RN: Montey Hora Primary Care Physician: Reed Breech Other Clinician: Referring Physician: Reed Breech Treating Physician/Extender: Frann Rider in Treatment: 0 Diagnosis Coding ICD-10 Codes Code Description E11.622 Type 2 diabetes mellitus with other skin ulcer L97.812 Non-pressure chronic ulcer of other part of right lower leg with fat layer exposed S81.811A Laceration without foreign body, right lower leg,  initial encounter Facility Procedures CPT4: Description Modifier Quantity Code [id number] 99213 - WOUND CARE VISIT-LEV 3 EST PT 1 CPT4: 05697948 11042 - DEB SUBQ TISSUE 20 SQ CM/< 1 ICD-10 Description Diagnosis E11.622 Type 2 diabetes mellitus with other skin ulcer S81.811A Laceration without foreign body, right lower leg, initial encounter L97.812 Non-pressure chronic ulcer of  other part of right lower leg with fat layer exposed Physician Procedures CPT4: Description Modifier Quantity Code 0165537 [id number] - WC PHYS LEVEL 4 - NEW PT 1 ICD-10 Description Diagnosis E11.622 Type 2 diabetes mellitus with other skin ulcer L97.812 Non-pressure chronic ulcer of other part of right lower leg with fat layer  exposed S81.811A Laceration without foreign body, right lower leg, initial encounter CPT4: 7867544 11042 - WC PHYS SUBQ TISS 20 SQ CM 1 ICD-10 Description Diagnosis E11.622 Type 2 diabetes mellitus with other skin ulcer S81.811A Laceration without foreign body, right lower leg, initial encounter ISHANA, BLADES ([id number]) Electronic Signature(s) Signed: 03/21/2015 4:35:17 PM By: Junious Dresser RN Signed: 03/21/2015 5:24:37 PM By: Christin Fudge MD, FACS Previous Signature: 03/20/2015 2:25:04 PM Version By: Christin Fudge MD, FACS Entered By: Junious Dresser on 03/21/2015 10:04:48

## 2015-03-27 ENCOUNTER — Ambulatory Visit: Payer: Medicare Other | Admitting: Surgery

## 2015-03-27 DIAGNOSIS — M353 Polymyalgia rheumatica: Secondary | ICD-10-CM | POA: Diagnosis not present

## 2015-03-27 DIAGNOSIS — M159 Polyosteoarthritis, unspecified: Secondary | ICD-10-CM | POA: Diagnosis not present

## 2015-03-29 ENCOUNTER — Encounter: Payer: Medicare Other | Attending: Surgery | Admitting: Surgery

## 2015-03-29 DIAGNOSIS — L97811 Non-pressure chronic ulcer of other part of right lower leg limited to breakdown of skin: Secondary | ICD-10-CM | POA: Diagnosis not present

## 2015-03-29 DIAGNOSIS — S81811A Laceration without foreign body, right lower leg, initial encounter: Secondary | ICD-10-CM | POA: Insufficient documentation

## 2015-03-29 DIAGNOSIS — X58XXXA Exposure to other specified factors, initial encounter: Secondary | ICD-10-CM | POA: Insufficient documentation

## 2015-03-29 DIAGNOSIS — E11622 Type 2 diabetes mellitus with other skin ulcer: Secondary | ICD-10-CM | POA: Diagnosis not present

## 2015-03-29 DIAGNOSIS — I10 Essential (primary) hypertension: Secondary | ICD-10-CM | POA: Insufficient documentation

## 2015-03-29 DIAGNOSIS — E039 Hypothyroidism, unspecified: Secondary | ICD-10-CM | POA: Insufficient documentation

## 2015-03-29 DIAGNOSIS — L97812 Non-pressure chronic ulcer of other part of right lower leg with fat layer exposed: Secondary | ICD-10-CM | POA: Insufficient documentation

## 2015-03-30 NOTE — Progress Notes (Signed)
Michelle [name], Michelle [name] ([id number]) Visit Report for 03/29/2015 Arrival Information Details Patient Name: Michelle [name], Michelle [name]. Date of Service: 03/29/2015 2:30 PM Medical Record Number: [id number] Patient Account Number: [id number] Date of Birth/Sex: Mar 21, 1932 (81 y.o. Female) Treating RN: Afful, RN, BSN, Velva Harman Primary Care Physician: Reed Breech Other Clinician: Referring Physician: Reed Breech Treating Physician/Extender: Frann Rider in Treatment: 1 Visit Information History Since Last Visit Any new allergies or adverse reactions: No Patient Arrived: Michelle [name] Had a fall or experienced change in No Arrival Time: 14:33 activities of daily living that may affect Accompanied By: self risk of falls: Transfer Assistance: None Signs or symptoms of abuse/neglect since last No Patient Identification Verified: Yes visito Secondary Verification Process Completed: Yes Hospitalized since last visit: No Patient Has Alerts: Yes Has Dressing in Place as Prescribed: Yes Patient Alerts: DMII Has Compression in Place as Prescribed: Yes Pain Present Now: No Electronic Signature(s) Signed: 03/29/2015 3:59:47 PM By: Regan Lemming BSN, RN Entered By: Regan Lemming on 03/29/2015 14:34:17 Michelle [name] ([id number]) -------------------------------------------------------------------------------- Encounter Discharge Information Details Patient Name: Michelle [name] Date of Service: 03/29/2015 2:30 PM Medical Record Number: [id number] Patient Account Number: [id number] Date of Birth/Sex: 1931-12-22 (81 y.o. Female) Treating RN: Baruch Gouty, RN, BSN, Velva Harman Primary Care Physician: Reed Breech Other Clinician: Referring Physician: Reed Breech Treating Physician/Extender: Frann Rider in Treatment: 1 Encounter Discharge Information Items Discharge Pain Level: 0 Discharge Condition: Stable Ambulatory Status: Cane Discharge Destination: Home Transportation: Private  Auto Accompanied By: self Schedule Follow-up Appointment: No Medication Reconciliation completed No and provided to Patient/Care Tyquasia Pant: Provided on Clinical Summary of Care: 03/29/2015 Form Type Recipient Paper Patient ML Electronic Signature(s) Signed: 03/29/2015 3:59:47 PM By: Regan Lemming BSN, RN Previous Signature: 03/29/2015 2:59:52 PM Version By: Ruthine Dose Entered By: Regan Lemming on 03/29/2015 15:13:16 Michelle [name] ([id number]) -------------------------------------------------------------------------------- Lower Extremity Assessment Details Patient Name: Michelle [name] Date of Service: 03/29/2015 2:30 PM Medical Record Number: [id number] Patient Account Number: [id number] Date of Birth/Sex: [birth year] (81 y.o. Female) Treating RN: Afful, RN, BSN, Velva Harman Primary Care Physician: Reed Breech Other Clinician: Referring Physician: Reed Breech Treating Physician/Extender: Frann Rider in Treatment: 1 Edema Assessment Assessed: [Left: No] [Right: No] Edema: [Left: N] [Right: o] Calf Left: Right: Point of Measurement: 36 cm From Medial Instep cm 39 cm Ankle Left: Right: Point of Measurement: 8 cm From Medial Instep cm 21 cm Vascular Assessment Claudication: Claudication Assessment [Right:None] Pulses: Posterior Tibial Dorsalis Pedis Palpable: [Right:Yes] Extremity colors, hair growth, and conditions: Extremity Color: [Right:Normal] Hair Growth on Extremity: [Right:No] Temperature of Extremity: [Right:Warm] Capillary Refill: [Right:< 3 seconds] Dependent Rubor: [Right:No] Blanched when Elevated: [Right:No] Toe Nail Assessment Left: Right: Thick: Yes Discolored: No Deformed: No Electronic Signature(s) Signed: 03/29/2015 3:59:47 PM By: Regan Lemming BSN, RN [place], Elmon Else ([id number]) Entered By: Regan Lemming on 03/29/2015 14:38:22 Michelle [name]  ([id number]) -------------------------------------------------------------------------------- Multi Wound Chart Details Patient Name: Michelle [name] Date of Service: 03/29/2015 2:30 PM Medical Record Number: [id number] Patient Account Number: [id number] Date of Birth/Sex: [birth year] (81 y.o. Female) Treating RN: Montey Hora Primary Care Physician: Reed Breech Other Clinician: Referring Physician: Reed Breech Treating Physician/Extender: Frann Rider in Treatment: 1 Vital Signs Height(in): 62 Pulse(bpm): 66 Weight(lbs): 140 Blood Pressure 160/75 (mmHg): Body Mass Index(BMI): 26 Temperature(F): 98.0 Respiratory Rate 17 (breaths/min): Photos: [1:No Photos] [N/A:N/A] Wound Location: [1:Right Lower Leg] [N/A:N/A] Wounding Event: [1:Trauma] [N/A:N/A] Primary Etiology: [1:Trauma, Other] [N/A:N/A] Comorbid History: [1:Chronic Obstructive Pulmonary Disease (COPD), Arrhythmia,  Hypertension, Type II Diabetes] [N/A:N/A] Date Acquired: [1:03/05/2015] [N/A:N/A] Weeks of Treatment: [1:1] [N/A:N/A] Wound Status: [1:Open] [N/A:N/A] Measurements L x W x D 2.2x1.8x1 [N/A:N/A] (cm) Area (cm) : [1:3.11] [N/A:N/A] Volume (cm) : [1:3.11] [N/A:N/A] % Reduction in Area: [1:8.30%] [N/A:N/A] % Reduction in Volume: 16.70% [N/A:N/A] Classification: [1:Full Thickness Without Exposed Support Structures] [N/A:N/A] HBO Classification: [1:Grade 2] [N/A:N/A] Exudate Amount: [1:Large] [N/A:N/A] Exudate Type: [1:Serosanguineous] [N/A:N/A] Exudate Color: [1:red, brown] [N/A:N/A] Wound Margin: [1:Distinct, outline attached] [N/A:N/A] Granulation Amount: [1:Small (1-33%)] [N/A:N/A] Granulation Quality: [1:Red] [N/A:N/A] Necrotic Amount: [1:Large (67-100%)] [N/A:N/A] Exposed Structures: [N/A:N/A] Fascia: No Fat: No Tendon: No Muscle: No Joint: No Bone: No Limited to Skin Breakdown Epithelialization: None N/A N/A Periwound Skin Texture: Edema: Yes N/A N/A Excoriation:  No Induration: No Callus: No Crepitus: No Fluctuance: No Friable: No Rash: No Scarring: No Periwound Skin Moist: Yes N/A N/A Moisture: Maceration: No Dry/Scaly: No Periwound Skin Color: Erythema: Yes N/A N/A Atrophie Blanche: No Cyanosis: No Ecchymosis: No Hemosiderin Staining: No Mottled: No Pallor: No Rubor: No Erythema Location: Circumferential N/A N/A Temperature: No Abnormality N/A N/A Tenderness on No N/A N/A Palpation: Wound Preparation: Ulcer Cleansing: N/A N/A Rinsed/Irrigated with Saline Topical Anesthetic Applied: Other: lidocaine 4% Treatment Notes Electronic Signature(s) Signed: 03/29/2015 4:34:18 PM By: Montey Hora Entered By: Montey Hora on 03/29/2015 14:51:30 Michelle [name] ([id number]) -------------------------------------------------------------------------------- Michelle [name] Details Patient Name: Michelle [name] Date of Service: 03/29/2015 2:30 PM Medical Record Number: [id number] Patient Account Number: [id number] Date of Birth/Sex: [birth year] (81 y.o. Female) Treating RN: Montey Hora Primary Care Physician: Reed Breech Other Clinician: Referring Physician: Reed Breech Treating Physician/Extender: Frann Rider in Treatment: 1 Active Inactive Abuse / Safety / Falls / Self Care Management Nursing Diagnoses: Impaired physical mobility Potential for falls Goals: Patient will remain injury free Date Initiated: 03/20/2015 Goal Status: Active Patient/caregiver will verbalize understanding of skin care regimen Date Initiated: 03/20/2015 Goal Status: Active Patient/caregiver will verbalize/demonstrate measures taken to prevent injury and/or falls Date Initiated: 03/20/2015 Goal Status: Active Patient/caregiver will verbalize/demonstrate understanding of what to do in case of emergency Date Initiated: 03/20/2015 Goal Status: Active Interventions: Assess fall risk on admission and as  needed Provide education on fall prevention Provide education on safe transfers Notes: Nutrition Nursing Diagnoses: Potential for alteratiion in Nutrition/Potential for imbalanced nutrition Goals: Patient/caregiver verbalizes understanding of need to maintain therapeutic glucose control per primary care physician Date Initiated: 03/20/2015 Michelle [name], Michelle [name] ([id number]) Goal Status: Active Patient/caregiver will maintain therapeutic glucose control Date Initiated: 03/20/2015 Goal Status: Active Interventions: Assess HgA1c results as ordered upon admission and as needed Provide education on elevated blood sugars and impact on wound healing Provide education on nutrition Treatment Activities: Obtain HgA1c : 03/29/2015 Notes: Orientation to the Wound Care Program Nursing Diagnoses: Knowledge deficit related to the wound healing center program Goals: Patient/caregiver will verbalize understanding of the [facility] Date Initiated: 03/20/2015 Goal Status: Active Interventions: Provide education on orientation to the wound center Notes: Wound/Skin Impairment Nursing Diagnoses: Impaired tissue integrity Knowledge deficit related to ulceration/compromised skin integrity Goals: Patient/caregiver will verbalize understanding of skin care regimen Date Initiated: 03/20/2015 Goal Status: Active Ulcer/skin breakdown will heal within 14 weeks Date Initiated: 03/20/2015 Goal Status: Active Interventions: Assess patient/caregiver ability to obtain necessary supplies Assess patient/caregiver ability to perform ulcer/skin care regimen upon admission and as needed Michelle [name], Michelle [name]. ([id number]) Assess ulceration(s) every visit Provide education on ulcer and skin care Notes: Electronic Signature(s) Signed: 03/29/2015 4:34:18 PM By: Montey Hora Entered By:  Montey Hora on 03/29/2015 14:51:20 Michelle [name], Michelle C.  ([id number]) -------------------------------------------------------------------------------- Pain Assessment Details Patient Name: Michelle [name], Michelle C. Date of Service: 03/29/2015 2:30 PM Medical Record Number: [id number] Patient Account Number: [id number] Date of Birth/Sex: [birth year] (81 y.o. Female) Treating RN: Baruch Gouty, RN, BSN, Velva Harman Primary Care Physician: Reed Breech Other Clinician: Referring Physician: Reed Breech Treating Physician/Extender: Frann Rider in Treatment: 1 Active Problems Location of Pain Severity and Description of Pain Patient Has Paino No Site Locations Pain Management and Medication Current Pain Management: Electronic Signature(s) Signed: 03/29/2015 3:59:47 PM By: Regan Lemming BSN, RN Entered By: Regan Lemming on 03/29/2015 14:34:23 Michelle [name] ([id number]) -------------------------------------------------------------------------------- Patient/Caregiver Education Details Patient Name: Michelle [name] Date of Service: 03/29/2015 2:30 PM Medical Record Number: [id number] Patient Account Number: [id number] Date of Birth/Gender: 1932/04/21 (81 y.o. Female) Treating RN: Afful, RN, BSN, Velva Harman Primary Care Physician: Reed Breech Other Clinician: Referring Physician: Reed Breech Treating Physician/Extender: Frann Rider in Treatment: 1 Education Assessment Education Provided To: Patient Education Topics Provided Wound/Skin Impairment: Methods: Explain/Verbal Responses: State content correctly Electronic Signature(s) Signed: 03/29/2015 3:59:47 PM By: Regan Lemming BSN, RN Entered By: Regan Lemming on 03/29/2015 15:13:29 Michelle [name] ([id number]) -------------------------------------------------------------------------------- Wound Assessment Details Patient Name: Michelle [name] Date of Service: 03/29/2015 2:30 PM Medical Record Number: [id number] Patient Account Number: [id number] Date of Birth/Sex: [birth year]  (81 y.o. Female) Treating RN: Afful, RN, BSN, Velva Harman Primary Care Physician: Reed Breech Other Clinician: Referring Physician: Reed Breech Treating Physician/Extender: Frann Rider in Treatment: 1 Wound Status Wound Number: 1 Primary Trauma, Other Etiology: Wound Location: Right Lower Leg Wound Open Wounding Event: Trauma Status: Date Acquired: 03/05/2015 Comorbid Chronic Obstructive Pulmonary Weeks Of Treatment: 1 History: Disease (COPD), Arrhythmia, Clustered Wound: No Hypertension, Type II Diabetes Photos Photo Uploaded By: Regan Lemming on 03/29/2015 15:57:43 Wound Measurements Length: (cm) 2.2 Width: (cm) 1.8 Depth: (cm) 1 Area: (cm) 3.11 Volume: (cm) 3.11 % Reduction in Area: 8.3% % Reduction in Volume: 16.7% Epithelialization: None Tunneling: No Undermining: No Wound Description Full Thickness Without Exposed Foul Odor A Classification: Support Structures Diabetic Severity Grade 2 (Wagner): Wound Margin: Distinct, outline attached Exudate Amount: Large Exudate Type: Serosanguineous Exudate Color: red, brown fter Cleansing: No Wound Bed Granulation Amount: Small (1-33%) Exposed Structure Michelle [name], Michelle C. ([id number]) Granulation Quality: Red Fascia Exposed: No Necrotic Amount: Large (67-100%) Fat Layer Exposed: No Necrotic Quality: Adherent Slough Tendon Exposed: No Muscle Exposed: No Joint Exposed: No Bone Exposed: No Limited to Skin Breakdown Periwound Skin Texture Texture Color No Abnormalities Noted: No No Abnormalities Noted: No Callus: No Atrophie Blanche: No Crepitus: No Cyanosis: No Excoriation: No Ecchymosis: No Fluctuance: No Erythema: Yes Friable: No Erythema Location: Circumferential Induration: No Hemosiderin Staining: No Localized Edema: Yes Mottled: No Rash: No Pallor: No Scarring: No Rubor: No Moisture Temperature / Pain No Abnormalities Noted: No Temperature: No Abnormality Dry / Scaly:  No Maceration: No Moist: Yes Wound Preparation Ulcer Cleansing: Rinsed/Irrigated with Saline Topical Anesthetic Applied: Other: lidocaine 4%, Treatment Notes Wound #1 (Right Lower Leg) 1. Cleansed with: Clean wound with Normal Saline 3. Peri-wound Care: Skin Prep 4. Dressing Applied: Aquacel Ag 5. Secondary Dressing Applied Bordered Foam Dressing 7. Secured with Tubigrip Electronic Signature(s) Signed: 03/29/2015 3:59:47 PM By: Regan Lemming BSN, RN Entered By: Regan Lemming on 03/29/2015 14:33:47 Michelle [name] ([name] ([id number]) -------------------------------------------------------------------------------- Vitals Details Patient Name: Michelle [name] Date of Service: 03/29/2015 2:30 PM Medical Record Number: [id number] Patient Account Number:  [id number] Date of Birth/Sex: 1932/11/10 (81 y.o. Female) Treating RN: Afful, RN, BSN, Velva Harman Primary Care Physician: Reed Breech Other Clinician: Referring Physician: Reed Breech Treating Physician/Extender: Frann Rider in Treatment: 1 Vital Signs Time Taken: 14:34 Temperature (F): 98.0 Height (in): 62 Pulse (bpm): 66 Weight (lbs): 140 Respiratory Rate (breaths/min): 17 Body Mass Index (BMI): 25.6 Blood Pressure (mmHg): 160/75 Reference Range: 80 - 120 mg / dl Electronic Signature(s) Signed: 03/29/2015 3:59:47 PM By: Regan Lemming BSN, RN Entered By: Regan Lemming on 03/29/2015 14:36:31

## 2015-03-30 NOTE — Progress Notes (Signed)
Michelle [name], Michelle [name] ([id number]) Visit Report for 03/29/2015 Chief Complaint Document Details Patient Name: Michelle [name], Michelle [name]. Date of Service: 03/29/2015 2:30 PM Medical Record Number: [id number] Patient Account Number: [id number] Date of Birth/Sex: 1931-12-28 (81 y.o. Female) Treating RN: Cornell Barman Primary Care Physician: Reed Breech Other Clinician: Referring Physician: Reed Breech Treating Physician/Extender: Frann Rider in Treatment: 1 Information Obtained from: Patient Chief Complaint Patient presents to the wound care center for a consult due non healing wound. Pleasant 81 year old, comes with a injury to her right lower extremity which has been there for about 2 weeks. Electronic Signature(s) Signed: 03/29/2015 4:37:19 PM By: Christin Fudge MD, FACS Entered By: Christin Fudge on 03/29/2015 14:58:27 Michelle [name] ([id number]) -------------------------------------------------------------------------------- Debridement Details Patient Name: Michelle [name] Date of Service: 03/29/2015 2:30 PM Medical Record Number: [id number] Patient Account Number: [id number] Date of Birth/Sex: Oct 25, 1932 (81 y.o. Female) Treating RN: Cornell Barman Primary Care Physician: Reed Breech Other Clinician: Referring Physician: Reed Breech Treating Physician/Extender: Frann Rider in Treatment: 1 Debridement Performed for Wound #1 Right Lower Leg Assessment: Performed By: Physician Pat Patrick., MD Debridement: Debridement Pre-procedure Yes Verification/Time Out Taken: Start Time: 14:51 Pain Control: Lidocaine 4% Topical Solution Level: Skin/Subcutaneous Tissue Total Area Debrided (L x 2.2 (cm) x 1.8 (cm) = 3.96 (cm) W): Tissue and other Fibrin/Slough material debrided: Instrument: Curette Bleeding: Minimum Hemostasis Achieved: Pressure End Time: 14:54 Procedural Pain: 0 Post Procedural Pain: 0 Response to Treatment: Procedure was tolerated  well Post Debridement Measurements of Total Wound Length: (cm) 2.2 Width: (cm) 1.8 Depth: (cm) 1 Volume: (cm) 3.11 Electronic Signature(s) Signed: 03/29/2015 4:37:19 PM By: Christin Fudge MD, FACS Signed: 03/29/2015 4:39:41 PM By: Gretta Cool RN, BSN, Kim RN, BSN Entered By: Christin Fudge on 03/29/2015 14:58:17 Michelle [name], Michelle C. ([id number]) -------------------------------------------------------------------------------- HPI Details Patient Name: Michelle [name]. Date of Service: 03/29/2015 2:30 PM Medical Record Number: [id number] Patient Account Number: [id number] Date of Birth/Sex: [birth year] (81 y.o. Female) Treating RN: Cornell Barman Primary Care Physician: Reed Breech Other Clinician: Referring Physician: Reed Breech Treating Physician/Extender: Frann Rider in Treatment: 1 History of Present Illness Location: right lower extremity Quality: Patient reports experiencing a dull pain to affected area(s). Severity: Patient states wound are getting worse. Duration: Patient has had the wound for < 2 weeks prior to presenting for treatment Timing: Pain in wound is Intermittent (comes and goes Context: The wound occurred when the patient had her door slammed into her right leg while she was at home about 2 weeks ago. Modifying Factors: Consults to this date include: application of hydrogen peroxide and Neosporin. Associated Signs and Symptoms: Patient reports having difficulty standing for long periods. HPI Description: this pleasant 81 year old who has been known to be a diabetic for the last 30 years has been injured with a door slamming into her leg about 2 weeks ago. The patient is a poor historian and we could not get many records from her family practice doctor's office. She has been new using hydrogen peroxide and Neosporin on the wound and says that she squeezed out the scab and some pus. other comorbidities include high blood pressure and hypothyroidism. She  says she checks her blood sugar everyday and it has been running fairly good and her last hemoglobin A1c done in November 2015 was 7.4. She has had no x-rays of her leg done nor she had any significant problems in the recent past. Electronic Signature(s) Signed: 03/29/2015 4:37:19 PM By: Christin Fudge MD, FACS  Entered By: Christin Fudge on 03/29/2015 14:58:39 Michelle [name] ([id number]) -------------------------------------------------------------------------------- Physical Exam Details Patient Name: Michelle [name], Michelle C. Date of Service: 03/29/2015 2:30 PM Medical Record Number: [id number] Patient Account Number: [id number] Date of Birth/Sex: 16-Dec-1931 (81 y.o. Female) Treating RN: Cornell Barman Primary Care Physician: Reed Breech Other Clinician: Referring Physician: Reed Breech Treating Physician/Extender: Frann Rider in Treatment: 1 Constitutional . Pulse regular. Respirations normal and unlabored. Afebrile. . Eyes Nonicteric. Reactive to light. Ears, Nose, Mouth, and Throat Lips, teeth, and gums WNL.Marland Kitchen Moist mucosa without lesions . Neck supple and nontender. No palpable supraclavicular or cervical adenopathy. Normal sized without goiter. Respiratory WNL. No retractions.. Cardiovascular Pedal Pulses WNL. No clubbing, cyanosis or edema. Integumentary (Hair, Skin) the ulcerated lesion on her right lower extremity is not undermined but does have some slough and sharp debridement will be done with a #3 curette.Marland Kitchen No crepitus or fluctuance. No peri-wound warmth or erythema. No masses.Marland Kitchen Psychiatric Judgement and insight Intact.. No evidence of depression, anxiety, or agitation.. Electronic Signature(s) Signed: 03/29/2015 4:37:19 PM By: Christin Fudge MD, FACS Entered By: Christin Fudge on 03/29/2015 14:59:43 Michelle [name] ([id number]) -------------------------------------------------------------------------------- Physician Orders Details Patient Name: Michelle [name] Date of Service: 03/29/2015 2:30 PM Medical Record Number: [id number] Patient Account Number: [id number] Date of Birth/Sex: 1932-08-28 (81 y.o. Female) Treating RN: Montey Hora Primary Care Physician: Reed Breech Other Clinician: Referring Physician: Reed Breech Treating Physician/Extender: Frann Rider in Treatment: 1 Verbal / Phone Orders: Yes Clinician: Montey Hora Read Back and Verified: Yes Diagnosis Coding Wound Cleansing Wound #1 Right Lower Leg o Clean wound with Normal Saline. Anesthetic Wound #1 Right Lower Leg o Topical Lidocaine 4% cream applied to wound bed prior to debridement Skin Barriers/Peri-Wound Care Wound #1 Right Lower Leg o Skin Prep Primary Wound Dressing Wound #1 Right Lower Leg o Aquacel Ag Secondary Dressing Wound #1 Right Lower Leg o Boardered Foam Dressing Dressing Change Frequency Wound #1 Right Lower Leg o Change dressing every other day. Follow-up Appointments Wound #1 Right Lower Leg o Return Appointment in 1 week. Edema Control Wound #1 Right Lower Leg o Tubigrip Electronic Signature(s) Michelle [name], Michelle [name] ([id number]) Signed: 03/29/2015 4:34:18 PM By: Montey Hora Signed: 03/29/2015 4:37:19 PM By: Christin Fudge MD, FACS Entered By: Montey Hora on 03/29/2015 14:55:07 Michelle [name] ([id number]) -------------------------------------------------------------------------------- Problem List Details Patient Name: Michelle [name]. Date of Service: 03/29/2015 2:30 PM Medical Record Number: [id number] Patient Account Number: [id number] Date of Birth/Sex: May 21, 1932 (81 y.o. 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 Female) Treating RN: Cornell Barman Primary Care Physician: Reed Breech Other Clinician: Referring Physician: Reed Breech Treating Physician/Extender: Frann Rider in Treatment: 1 Subjective Chief Complaint Information obtained from Patient Patient presents to the wound care  center for a consult due non healing wound. Pleasant 81 year old, comes with a injury to her right lower extremity which has been there for about 2 weeks. History of Present Illness (HPI) The following HPI elements were documented for the patient's wound: Location: right lower extremity Quality: Patient reports experiencing a dull pain to affected area(s). Severity: Patient states wound are getting worse. Duration: Patient has had the wound for < 2 weeks prior to presenting for treatment Timing: Pain in wound is Intermittent (comes and goes Context: The wound occurred when the patient had her door slammed into her right leg while she was at home about 2 weeks ago. Modifying Factors: Consults to this date include: application of hydrogen peroxide and Neosporin. Associated Signs and Symptoms: Patient reports having difficulty standing for long periods. this pleasant 81 year old who has been known to be a  diabetic for the last 30 years has been injured with a door slamming into her leg about 2 weeks ago. The patient is a poor historian and we could not get many records from her family practice doctor's office. She has been new using hydrogen peroxide and Neosporin on the wound and says that she squeezed out the scab and some pus. other comorbidities include high blood pressure and hypothyroidism. She says she checks her blood sugar everyday and it has been running fairly good and her last hemoglobin A1c done in November 2015 was 7.4. She has had no x-rays of her leg done nor she had any significant problems in the recent past. Objective Constitutional Pulse regular. Respirations normal and unlabored. Afebrile. Vitals Time Taken: 2:34 PM, Height: 62 in, Weight: 140 lbs, BMI: 25.6, Temperature: 98.0 F, Pulse: 66 bpm, Respiratory Rate: 17 breaths/min, Blood Pressure: 160/75 mmHg. Wearing, Avarie C. ([id number]) Eyes Nonicteric. Reactive to light. Ears, Nose, Mouth, and Throat Lips, teeth, and gums WNL.Marland Kitchen Moist mucosa without lesions . Neck supple and nontender. No palpable supraclavicular or cervical adenopathy. Normal sized without goiter. Respiratory WNL. No retractions.. Cardiovascular Pedal Pulses WNL. No clubbing, cyanosis or edema. Psychiatric Judgement and insight Intact.. No evidence of depression, anxiety, or agitation.. Integumentary (Hair, Skin) the ulcerated lesion on her right lower extremity is not undermined but does have some slough and sharp debridement will be done with a #3 curette.Marland Kitchen No crepitus or fluctuance. No peri-wound warmth or erythema. No masses.. Wound #1 status is Open. Original cause of wound was Trauma. The wound is located on the Right Lower Leg. The wound measures 2.2cm length x 1.8cm width x 1cm depth; 3.11cm^2 area and 3.11cm^3 volume. The wound is limited to skin breakdown. There is no tunneling or undermining noted. There is a large amount of  serosanguineous drainage noted. The wound margin is distinct with the outline attached to the wound base. There is small (1-33%) red granulation within the wound bed. There is a large (67-100%) amount of necrotic tissue within the wound bed including Adherent Slough. The periwound skin appearance exhibited: Localized Edema, Moist, Erythema. The periwound skin appearance did not exhibit: Callus, Crepitus, Excoriation, Fluctuance, Friable, Induration, Rash, Scarring, Dry/Scaly, Maceration, Atrophie Blanche, Cyanosis, Ecchymosis, Hemosiderin Staining, Mottled, Pallor, Rubor. The surrounding wound skin color is noted with erythema which is circumferential. Periwound temperature was noted as No Abnormality. the ulcerated lesion on her right lower extremity is not undermined but does have some slough and sharp debridement will be done with a #3 curette. Assessment Active Problems ICD-10 E11.622 - Type 2 diabetes mellitus with other skin ulcer Michelle [name], Michelle C. ([id number]) K34.917 -  Non-pressure chronic ulcer of other part of right lower leg with fat layer exposed S81.811A - Laceration without foreign body, right lower leg, initial encounter Procedures Wound #1 Wound #1 is a Trauma, Other located on the Right Lower Leg . There was a Skin/Subcutaneous Tissue Debridement ([id number]) debridement with total area of 3.96 sq cm performed by Pat Patrick., MD. with the following instrument(s): Curette including Fibrin/Slough after achieving pain control using Lidocaine 4% Topical Solution. A time out was conducted prior to the start of the procedure. A Minimum amount of bleeding was controlled with Pressure. The procedure was tolerated well with a pain level of 0 throughout and a pain level of 0 following the procedure. Post Debridement Measurements: 2.2cm length x 1.8cm width x 1cm depth; 3.11cm^3 volume. Plan Wound Cleansing: Wound #1 Right Lower Leg: Clean wound with Normal  Saline. Anesthetic: Wound #1 Right Lower Leg: Topical Lidocaine 4% cream applied to wound bed prior to debridement Skin Barriers/Peri-Wound Care: Wound #1 Right Lower Leg: Skin Prep Primary Wound Dressing: Wound #1 Right Lower Leg: Aquacel Ag Secondary Dressing: Wound #1 Right Lower Leg: Boardered Foam Dressing Dressing Change Frequency: Wound #1 Right Lower Leg: Change dressing every other day. Follow-up Appointments: Wound #1 Right Lower Leg: Return Appointment in 1 week. Edema Control: Wound #1 Right Lower Leg: Tubigrip Michelle [name], Michelle C. ([id number]) I have recommended continuing with packing of silver alginate and doing this on alternate days. She is doing her dressing herself and does not have much help from family members. She has issues with transport but says she will come back next week. Electronic Signature(s) Signed: 03/29/2015 4:37:19 PM By: Christin Fudge MD, FACS Entered By: Christin Fudge on 03/29/2015 15:00:57 Michelle [name] ([id number]) -------------------------------------------------------------------------------- SuperBill Details Patient Name: Michelle [name] Date of Service: 03/29/2015 Medical Record Number: [id number] Patient Account Number: [id number] Date of Birth/Sex: 03-25-1932 (81 y.o. Female) Treating RN: Cornell Barman Primary Care Physician: Reed Breech Other Clinician: Referring Physician: Reed Breech Treating Physician/Extender: Frann Rider in Treatment: 1 Diagnosis Coding ICD-10 Codes Code Description E11.622 Type 2 diabetes mellitus with other skin ulcer L97.812 Non-pressure chronic ulcer of other part of right lower leg with fat layer exposed S81.811A Laceration without foreign body, right lower leg, initial encounter Facility Procedures CPT4: Description Modifier Quantity Code [id number] 11042 - DEB SUBQ TISSUE 20 SQ CM/< 1 ICD-10 Description Diagnosis E11.622 Type 2 diabetes mellitus with other skin ulcer L97.812  Non-pressure chronic ulcer of other part of right lower leg with fat layer  exposed S81.811A Laceration without foreign body, right lower leg, initial encounter Physician Procedures CPT4: Description Modifier Quantity Code [id number] 11042 - WC PHYS SUBQ TISS 20 SQ CM 1 ICD-10 Description Diagnosis E11.622 Type 2 diabetes mellitus with other skin ulcer L97.812 Non-pressure chronic ulcer of other part of right lower leg with fat layer  exposed S81.811A Laceration without foreign body, right lower leg, initial encounter Electronic Signature(s) Signed: 03/29/2015 4:37:19 PM By: Christin Fudge MD, FACS Entered By: Christin Fudge on 03/29/2015 15:01:08

## 2015-04-05 ENCOUNTER — Encounter: Payer: Medicare Other | Admitting: Surgery

## 2015-04-05 DIAGNOSIS — S81811A Laceration without foreign body, right lower leg, initial encounter: Secondary | ICD-10-CM | POA: Diagnosis not present

## 2015-04-05 DIAGNOSIS — E785 Hyperlipidemia, unspecified: Secondary | ICD-10-CM | POA: Diagnosis not present

## 2015-04-05 DIAGNOSIS — I10 Essential (primary) hypertension: Secondary | ICD-10-CM | POA: Diagnosis not present

## 2015-04-05 DIAGNOSIS — E11622 Type 2 diabetes mellitus with other skin ulcer: Secondary | ICD-10-CM | POA: Diagnosis not present

## 2015-04-05 DIAGNOSIS — J449 Chronic obstructive pulmonary disease, unspecified: Secondary | ICD-10-CM | POA: Diagnosis not present

## 2015-04-05 DIAGNOSIS — D500 Iron deficiency anemia secondary to blood loss (chronic): Secondary | ICD-10-CM | POA: Diagnosis not present

## 2015-04-05 DIAGNOSIS — R600 Localized edema: Secondary | ICD-10-CM | POA: Diagnosis not present

## 2015-04-05 DIAGNOSIS — E119 Type 2 diabetes mellitus without complications: Secondary | ICD-10-CM | POA: Diagnosis not present

## 2015-04-05 DIAGNOSIS — L97812 Non-pressure chronic ulcer of other part of right lower leg with fat layer exposed: Secondary | ICD-10-CM | POA: Diagnosis not present

## 2015-04-06 NOTE — Progress Notes (Addendum)
Michelle [name], Michelle [name] ([id number]) Visit Report for 04/05/2015 Arrival Information Details Patient Name: Michelle [name], Michelle [name]. Date of Service: 04/05/2015 1:45 PM Medical Record Number: [id number] Patient Account Number: [id number] Date of Birth/Sex: Jun 23, 1932 (82 y.o. Female) Treating Michelle [name]: Montey Hora Primary Care Physician: Reed Breech Other Clinician: Referring Physician: Reed Breech Treating Physician/Extender: Olivia Canter in Treatment: 2 Visit Information History Since Last Visit Added or deleted any medications: No Patient Arrived: Cane Any new allergies or adverse reactions: No Arrival Time: 14:16 Had a fall or experienced change in No Accompanied By: self activities of daily living that may affect Transfer Assistance: None risk of falls: Patient Identification Verified: Yes Signs or symptoms of abuse/neglect since last No Secondary Verification Process Yes visito Completed: Hospitalized since last visit: No Patient Has Alerts: Yes Pain Present Now: No Patient Alerts: DMII ABI >220 [place] Electronic Signature(s) Signed: 04/10/2015 8:48:52 AM By: Montey Hora Previous Signature: 04/05/2015 5:04:08 PM Version By: Montey Hora Entered By: Montey Hora on 04/10/2015 08:48:51 Michelle [name] ([id number]) -------------------------------------------------------------------------------- Encounter Discharge Information Details Patient Name: Michelle [name] Date of Service: 04/05/2015 1:45 PM Medical Record Number: [id number] Patient Account Number: [id number] Date of Birth/Sex: [birth year] (82 y.o. Female) Treating Michelle [name]: Primary Care Physician: Reed Breech Other Clinician: Referring Physician: Reed Breech Treating Physician/Extender: BURNS, Charlean Sanfilippo in Treatment: 2 Encounter Discharge Information Items Discharge Pain Level: 0 Discharge Condition: Stable Ambulatory Status: Cane Discharge Destination: Home Transportation: Private  Auto Accompanied By: self Schedule Follow-up Appointment: Yes Medication Reconciliation completed No and provided to Patient/Care Michelle [name]: Provided on Clinical Summary of Care: 04/05/2015 Form Type Recipient Paper Patient ML Electronic Signature(s) Signed: 04/05/2015 3:25:46 PM By: Montey Hora Previous Signature: 04/05/2015 2:43:11 PM Version By: Ruthine Dose Entered By: Montey Hora on 04/05/2015 15:25:45 Michelle [name] ([id number]) -------------------------------------------------------------------------------- Lower Extremity Assessment Details Patient Name: Michelle [name] Date of Service: 04/05/2015 1:45 PM Medical Record Number: [id number] Patient Account Number: [id number] Date of Birth/Sex: 02-23-1932 (82 y.o. Female) Treating Michelle [name]: Montey Hora Primary Care Physician: Reed Breech Other Clinician: Referring Physician: Reed Breech Treating Physician/Extender: BURNS, Charlean Sanfilippo in Treatment: 2 Edema Assessment Assessed: [Left: No] [Right: No] E[Left: dema] [Right: :] Calf Left: Right: Point of Measurement: 36 cm From Medial Instep cm 37.6 cm Ankle Left: Right: Point of Measurement: 8 cm From Medial Instep cm 22.4 cm Vascular Assessment Pulses: Posterior Tibial Palpable: [Right:Yes] Dorsalis Pedis Palpable: [Right:Yes] Extremity colors, hair growth, and conditions: Extremity Color: [Right:Hyperpigmented] Hair Growth on Extremity: [Right:No] Temperature of Extremity: [Right:Warm] Capillary Refill: [Right:< 3 seconds] Toe Nail Assessment Left: Right: Thick: No Discolored: No Deformed: No Improper Length and Hygiene: No Electronic Signature(s) Signed: 04/05/2015 5:04:08 PM By: Montey Hora Entered By: Montey Hora on 04/05/2015 14:24:26 Michelle [name] ([id number]) Kyung Bacca, Elmon Else ([id number]) -------------------------------------------------------------------------------- Multi Wound Chart Details Patient Name: Michelle [name] Date of Service: 04/05/2015 1:45 PM Medical Record Number: [id number] Patient Account Number: [id number] Date of Birth/Sex: Mar 31, 1932 (82 y.o. Female) Treating Michelle [name]: Montey Hora Primary Care Physician: Reed Breech Other Clinician: Referring Physician: Reed Breech Treating Physician/Extender: BURNS, Charlean Sanfilippo in Treatment: 2 Vital Signs Height(in): 62 Pulse(bpm): 63 Weight(lbs): 140 Blood Pressure 190/42 (mmHg): Body Mass Index(BMI): 26 Temperature(F): 98.2 Respiratory Rate 18 (breaths/min): Photos: [1:No Photos] [N/A:N/A] Wound Location: [1:Right Lower Leg] [N/A:N/A] Wounding Event: [1:Trauma] [N/A:N/A] Primary Etiology: [1:Trauma, Other] [N/A:N/A] Comorbid History: [1:Chronic Obstructive Pulmonary Disease (COPD), Arrhythmia, Hypertension, Type II Diabetes] [N/A:N/A] Date Acquired: [1:03/05/2015] [N/A:N/A] Weeks of Treatment: [1:2] [N/A:N/A] Wound  Status: [1:Open] [N/A:N/A] Measurements L x W x D 1.9x1.6x0.4 [N/A:N/A] (cm) Area (cm) : [1:2.388] [N/A:N/A] Volume (cm) : [1:0.955] [N/A:N/A] % Reduction in Area: [1:29.60%] [N/A:N/A] % Reduction in Volume: 74.40% [N/A:N/A] Classification: [1:Full Thickness Without Exposed Support Structures] [N/A:N/A] HBO Classification: [1:Grade 2] [N/A:N/A] Exudate Amount: [1:Large] [N/A:N/A] Exudate Type: [1:Serosanguineous] [N/A:N/A] Exudate Color: [1:red, brown] [N/A:N/A] Wound Margin: [1:Distinct, outline attached] [N/A:N/A] Granulation Amount: [1:Medium (34-66%)] [N/A:N/A] Granulation Quality: [1:Red] [N/A:N/A] Necrotic Amount: [1:Medium (34-66%)] [N/A:N/A] Exposed Structures: [N/A:N/A] Fascia: No Fat: No Tendon: No Muscle: No Joint: No Bone: No Limited to Skin Breakdown Epithelialization: None N/A N/A Debridement: Open Wound/Selective N/A N/A ([id number]) - Selective Time-Out Taken: Yes N/A N/A Pain Control: Lidocaine 4% Topical N/A N/A Solution Level: Non-Viable Tissue N/A N/A Debridement  Area (sq 3.04 N/A N/A cm): Instrument: Curette N/A N/A Bleeding: None N/A N/A Procedural Pain: 0 N/A N/A Post Procedural Pain: 0 N/A N/A Debridement Treatment Procedure was tolerated N/A N/A Response: well Post Debridement 1.9x1.6x0.5 N/A N/A Measurements L x W x D (cm) Post Debridement 1.194 N/A N/A Volume: (cm) Periwound Skin Texture: Edema: Yes N/A N/A Excoriation: No Induration: No Callus: No Crepitus: No Fluctuance: No Friable: No Rash: No Scarring: No Periwound Skin Moist: Yes N/A N/A Moisture: Maceration: No Dry/Scaly: No Periwound Skin Color: Erythema: Yes N/A N/A Atrophie Blanche: No Cyanosis: No Ecchymosis: No Hemosiderin Staining: No Mottled: No Pallor: No Rubor: No Erythema Location: Circumferential N/A N/A Temperature: No Abnormality N/A N/A Michelle [name], Michelle C. ([id number]) Tenderness on No N/A N/A Palpation: Wound Preparation: Ulcer Cleansing: N/A N/A Rinsed/Irrigated with Saline Topical Anesthetic Applied: Other: lidocaine 4% Procedures Performed: Debridement N/A N/A Treatment Notes Electronic Signature(s) Signed: 04/05/2015 5:04:08 PM By: Montey Hora Entered By: Montey Hora on 04/05/2015 14:33:20 Michelle [name] ([id number]) -------------------------------------------------------------------------------- [place] Details Patient Name: Michelle [name] Date of Service: 04/05/2015 1:45 PM Medical Record Number: [id number] Patient Account Number: [id number] Date of Birth/Sex: February 13, 1932 (82 y.o. Female) Treating Michelle [name]: Montey Hora Primary Care Physician: Reed Breech Other Clinician: Referring Physician: Reed Breech Treating Physician/Extender: BURNS, Charlean Sanfilippo in Treatment: 2 Active Inactive Abuse / Safety / Falls / Self Care Management Nursing Diagnoses: Impaired physical mobility Potential for falls Goals: Patient will remain injury free Date Initiated: 03/20/2015 Goal Status:  Active Patient/caregiver will verbalize understanding of skin care regimen Date Initiated: 03/20/2015 Goal Status: Active Patient/caregiver will verbalize/demonstrate measures taken to prevent injury and/or falls Date Initiated: 03/20/2015 Goal Status: Active Patient/caregiver will verbalize/demonstrate understanding of what to do in case of emergency Date Initiated: 03/20/2015 Goal Status: Active Interventions: Assess fall risk on admission and as needed Provide education on fall prevention Provide education on safe transfers Notes: Nutrition Nursing Diagnoses: Potential for alteratiion in Nutrition/Potential for imbalanced nutrition Goals: Patient/caregiver verbalizes understanding of need to maintain therapeutic glucose control per primary care physician Date Initiated: 03/20/2015 Michelle [name], Michelle [name] ([id number]) Goal Status: Active Patient/caregiver will maintain therapeutic glucose control Date Initiated: 03/20/2015 Goal Status: Active Interventions: Assess HgA1c results as ordered upon admission and as needed Provide education on elevated blood sugars and impact on wound healing Provide education on nutrition Treatment Activities: Obtain HgA1c : 04/05/2015 Notes: Orientation to the Wound Care Program Nursing Diagnoses: Knowledge deficit related to the wound healing center program Goals: Patient/caregiver will verbalize understanding of the [place] Date Initiated: 03/20/2015 Goal Status: Active Interventions: Provide education on orientation to the wound center Notes: Wound/Skin Impairment Nursing Diagnoses: Impaired tissue integrity Knowledge deficit related to ulceration/compromised skin integrity Goals: Patient/caregiver  will verbalize understanding of skin care regimen Date Initiated: 03/20/2015 Goal Status: Active Ulcer/skin breakdown will heal within 14 weeks Date Initiated: 03/20/2015 Goal Status: Active Interventions: Assess  patient/caregiver ability to obtain necessary supplies Assess patient/caregiver ability to perform ulcer/skin care regimen upon admission and as needed Michelle [name], BAS. ([id number]) Assess ulceration(s) every visit Provide education on ulcer and skin care Notes: Electronic Signature(s) Signed: 04/05/2015 5:04:08 PM By: Montey Hora Entered By: Montey Hora on 04/05/2015 14:33:12 Michelle [name] ([id number]) -------------------------------------------------------------------------------- Patient/Caregiver Education Details Patient Name: Michelle [name] Date of Service: 04/05/2015 1:45 PM Medical Record Number: [id number] Patient Account Number: [id number] Date of Birth/Gender: 08/16/1932 (81 y.o. Female) Treating Michelle [name]: Montey Hora Primary Care Physician: Reed Breech Other Clinician: Referring Physician: Reed Breech Treating Physician/Extender: Olivia Canter in Treatment: 2 Education Assessment Education Provided To: Patient Education Topics Provided Wound/Skin Impairment: Handouts: Other: wound care as ordered Methods: Demonstration, Explain/Verbal Responses: State content correctly Electronic Signature(s) Signed: 04/05/2015 3:26:02 PM By: Montey Hora Entered By: Montey Hora on 04/05/2015 15:26:02 Michelle [name] ([id number]) -------------------------------------------------------------------------------- Wound Assessment Details Patient Name: Michelle [name] Date of Service: 04/05/2015 1:45 PM Medical Record Number: [id number] Patient Account Number: [id number] Date of Birth/Sex: 07-24-1932 (82 y.o. Female) Treating Michelle [name]: Montey Hora Primary Care Physician: Reed Breech Other Clinician: Referring Physician: Reed Breech Treating Physician/Extender: BURNS, Charlean Sanfilippo in Treatment: 2 Wound Status Wound Number: 1 Primary Trauma, Other Etiology: Wound Location: Right Lower Leg Wound Open Wounding Event:  Trauma Status: Date Acquired: 03/05/2015 Comorbid Chronic Obstructive Pulmonary Weeks Of Treatment: 2 History: Disease (COPD), Arrhythmia, Clustered Wound: No Hypertension, Type II Diabetes Photos Photo Uploaded By: Michelle Cool, Michelle [name], Michelle [name], Michelle [name] on 04/05/2015 16:54:09 Wound Measurements Length: (cm) 1.9 Width: (cm) 1.6 Depth: (cm) 0.4 Area: (cm) 2.388 Volume: (cm) 0.955 % Reduction in Area: 29.6% % Reduction in Volume: 74.4% Epithelialization: None Tunneling: No Undermining: No Wound Description Full Thickness Without Exposed Foul Odor Af Classification: Support Structures Diabetic Severity Grade 2 ([place]): Wound Margin: Distinct, outline attached Exudate Amount: Large Exudate Type: Serosanguineous Exudate Color: red, brown ter Cleansing: No Wound Bed Granulation Amount: Medium (34-66%) Exposed Structure Michelle [name], Michelle C. ([id number]) Granulation Quality: Red Fascia Exposed: No Necrotic Amount: Medium (34-66%) Fat Layer Exposed: No Necrotic Quality: Adherent Slough Tendon Exposed: No Muscle Exposed: No Joint Exposed: No Bone Exposed: No Limited to Skin Breakdown Periwound Skin Texture Texture Color No Abnormalities Noted: No No Abnormalities Noted: No Callus: No Atrophie Blanche: No Crepitus: No Cyanosis: No Excoriation: No Ecchymosis: No Fluctuance: No Erythema: Yes Friable: No Erythema Location: Circumferential Induration: No Hemosiderin Staining: No Localized Edema: Yes Mottled: No Rash: No Pallor: No Scarring: No Rubor: No Moisture Temperature / Pain No Abnormalities Noted: No Temperature: No Abnormality Dry / Scaly: No Maceration: No Moist: Yes Wound Preparation Ulcer Cleansing: Rinsed/Irrigated with Saline Topical Anesthetic Applied: Other: lidocaine 4%, Treatment Notes Wound #1 (Right Lower Leg) 1. Cleansed with: Clean wound with Normal Saline 2. Anesthetic Topical Lidocaine 4% cream to wound bed prior to debridement 3. Peri-wound  Care: Skin Prep 4. Dressing Applied: Aquacel Ag 5. Secondary Dressing Applied Bordered Foam Dressing Electronic Signature(s) Signed: 04/05/2015 5:04:08 PM By: Montey Hora Entered By: Montey Hora on 04/05/2015 14:23:12 Michelle [name] ([id number]) Michelle [name] ([id number]) -------------------------------------------------------------------------------- Vitals Details Patient Name: Michelle [name] Date of Service: 04/05/2015 1:45 PM Medical Record Number: [id number] Patient Account Number: [id number] Date of Birth/Sex: April 25, 1932 (82 y.o. Female) Treating Michelle [name]: Montey Hora Primary Care Physician: Reed Breech Other  Clinician: Referring Physician: Reed Breech Treating Physician/Extender: BURNS, Charlean Sanfilippo in Treatment: 2 Vital Signs Time Taken: 14:18 Temperature (F): 98.2 Height (in): 62 Pulse (bpm): 63 Weight (lbs): 140 Respiratory Rate (breaths/min): 18 Body Mass Index (BMI): 25.6 Blood Pressure (mmHg): 190/42 Reference Range: 80 - 120 mg / dl Electronic Signature(s) Signed: 04/05/2015 5:04:08 PM By: Montey Hora Entered By: Montey Hora on 04/05/2015 14:18:20

## 2015-04-06 NOTE — Progress Notes (Signed)
Michelle [name] ([id number]) Visit Report for 04/05/2015 Chief Complaint Document Details Patient Name: Michelle [name]. Date of Service: 04/05/2015 1:45 PM Medical Record Number: [id number] Patient Account Number: [id number] Date of Birth/Sex: Jan 02, 1932 (82 y.o. Female) Treating RN: Primary Care Physician: Reed Breech Other Clinician: Referring Physician: Reed Breech Treating Physician/Extender: BURNS, Charlean Sanfilippo in Treatment: 2 Information Obtained from: Patient Chief Complaint Patient presents to the wound care center for a consult due non healing wound. Pleasant 81 year old, comes with a injury to her right lower extremity which has been there for about 2 weeks. Electronic Signature(s) Signed: 04/05/2015 4:25:37 PM By: Loletha Grayer MD Entered By: Loletha Grayer on 04/05/2015 14:57:56 Michelle [name] ([id number]) -------------------------------------------------------------------------------- Debridement Details Patient Name: Michelle [name] Date of Service: 04/05/2015 1:45 PM Medical Record Number: [id number] Patient Account Number: [id number] Date of Birth/Sex: 1932/04/18 (82 y.o. Female) Treating RN: Primary Care Physician: Reed Breech Other Clinician: Referring Physician: Reed Breech Treating Physician/Extender: BURNS, Charlean Sanfilippo in Treatment: 2 Debridement Performed for Wound #1 Right Lower Leg Assessment: Performed By: Physician BURNS, Teressa Senter., MD Debridement: Debridement Pre-procedure Yes Verification/Time Out Taken: Start Time: 14:31 Pain Control: Lidocaine 4% Topical Solution Level: Skin/Subcutaneous Tissue Total Area Debrided (L x 1.9 (cm) x 1.6 (cm) = 3.04 (cm) W): Tissue and other Viable, Non-Viable, Fat, Fibrin/Slough, Subcutaneous material debrided: Instrument: Curette Bleeding: Minimum Hemostasis Achieved: Pressure End Time: 14:33 Procedural Pain: 0 Post Procedural Pain: 0 Response to Treatment:  Procedure was tolerated well Post Debridement Measurements of Total Wound Length: (cm) 1.9 Width: (cm) 1.6 Depth: (cm) 0.5 Volume: (cm) 1.194 Electronic Signature(s) Signed: 04/05/2015 4:25:37 PM By: Loletha Grayer MD Entered By: Loletha Grayer on 04/05/2015 15:01:57 [name], [name] ([id number]) -------------------------------------------------------------------------------- HPI Details Patient Name: Michelle [name] Date of Service: 04/05/2015 1:45 PM Medical Record Number: [id number] Patient Account Number: [id number] Date of Birth/Sex: December 06, 1931 (82 y.o. Female) Treating RN: Primary Care Physician: Reed Breech Other Clinician: Referring Physician: Reed Breech Treating Physician/Extender: BURNS, Charlean Sanfilippo in Treatment: 2 History of Present Illness Location: right lower extremity Quality: Patient reports experiencing a dull pain to affected area(s). Severity: Patient states wound are getting worse. Duration: Patient has had the wound for < 2 weeks prior to presenting for treatment Timing: Pain in wound is Intermittent (comes and goes Context: The wound occurred when the patient had her door slammed into her right leg while she was at home about 2 weeks ago. Modifying Factors: Consults to this date include: application of hydrogen peroxide and Neosporin. Associated Signs and Symptoms: Patient reports having difficulty standing for long periods. HPI Description: this pleasant 81 year old who has been known to be a diabetic for the last 30 years has been injured with a door slamming into her leg about 2 weeks ago. The patient is a poor historian and we could not get many records from her family practice doctor's office. She has been new using hydrogen peroxide and Neosporin on the wound and says that she squeezed out the scab and some pus. other comorbidities include high blood pressure and hypothyroidism. She says she checks her blood sugar everyday and  it has been running fairly good and her last hemoglobin A1c done in November 2015 was 7.4. She has had no x-rays of her leg done nor she had any significant problems in the recent past. 04/05/2015 - No new complaints. No significant pain. No fever or chills. Improved drainage. Ambulating per her baseline with a cane. Electronic  Signature(s) Signed: 04/05/2015 4:25:37 PM By: Loletha Grayer MD Entered By: Loletha Grayer on 04/05/2015 14:59:03 Michelle [name] ([id number]) -------------------------------------------------------------------------------- Physical Exam Details Patient Name: Michelle [name] Date of Service: 04/05/2015 1:45 PM Medical Record Number: [id number] Patient Account Number: [id number] Date of Birth/Sex: August 22, 1932 (82 y.o. Female) Treating RN: Primary Care Physician: Reed Breech Other Clinician: Referring Physician: Reed Breech Treating Physician/Extender: BURNS, Charlean Sanfilippo in Treatment: 2 Constitutional . Pulse regular. Respirations normal and unlabored. Afebrile. Marland Kitchen Respiratory WNL. No retractions.. Cardiovascular Pedal Pulses WNL. Integumentary (Hair, Skin) .Marland Kitchen Neurological Sensation normal to touch, pin,and vibration. Psychiatric Judgement and insight Intact.. Oriented times 3.. No evidence of depression, anxiety, or agitation.. Notes Right anterior calf ulceration. Full-thickness. No exposed deep structures. No probing to bone. No cellulitis. Localized edema. 1+ pitting edema in the distal calf. Faintly palpable DP. Electronic Signature(s) Signed: 04/05/2015 4:25:37 PM By: Loletha Grayer MD Entered By: Loletha Grayer on 04/05/2015 15:00:13 Michelle [name] ([id number]) -------------------------------------------------------------------------------- Physician Orders Details Patient Name: Michelle [name] Date of Service: 04/05/2015 1:45 PM Medical Record Number: [id number] Patient Account Number: [id number] Date of Birth/Sex:  1932-11-10 (82 y.o. Female) Treating RN: Montey Hora Primary Care Physician: Reed Breech Other Clinician: Referring Physician: Reed Breech Treating Physician/Extender: Olivia Canter in Treatment: 2 Verbal / Phone Orders: Yes Clinician: Montey Hora Read Back and Verified: Yes Diagnosis Coding Wound Cleansing Wound #1 Right Lower Leg o Clean wound with Normal Saline. Anesthetic Wound #1 Right Lower Leg o Topical Lidocaine 4% cream applied to wound bed prior to debridement Skin Barriers/Peri-Wound Care Wound #1 Right Lower Leg o Skin Prep Primary Wound Dressing Wound #1 Right Lower Leg o Aquacel Ag Secondary Dressing Wound #1 Right Lower Leg o Boardered Foam Dressing Dressing Change Frequency Wound #1 Right Lower Leg o Change dressing every other day. Follow-up Appointments Wound #1 Right Lower Leg o Return Appointment in 1 week. Edema Control Wound #1 Right Lower Leg o Tubigrip Electronic Signature(s) ZURIAH, BORDAS ([id number]) Signed: 04/05/2015 4:25:37 PM By: Loletha Grayer MD Signed: 04/05/2015 5:04:08 PM By: Montey Hora Entered By: Montey Hora on 04/05/2015 14:33:56 Michelle [name] ([id number]) -------------------------------------------------------------------------------- Problem List Details Patient Name: Michelle [name] Date of Service: 04/05/2015 1:45 PM Medical Record Number: [id number] Patient Account Number: [id number] Date of Birth/Sex: May 12, 1932 (82 y.o. 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 ([id number]) -------------------------------------------------------------------------------- Progress Note Details Patient Name: Michelle [name] Date of Service: 04/05/2015 1:45 PM Medical Record Number: [id number] Patient Account Number: [id number] Date of Birth/Sex: [birth year] (82 y.o. Female) Treating RN: Primary Care Physician: Reed Breech Other Clinician: Referring Physician: Reed Breech Treating Physician/Extender: BURNS, Charlean Sanfilippo in Treatment: 2 Subjective Chief Complaint Information obtained from Patient Patient presents to the wound care center for a consult due non healing wound. Pleasant 81 year old, comes with a injury to her right lower extremity which has been there for about 2 weeks. History of Present  Illness (HPI) The following HPI elements were documented for the patient's wound: Location: right lower extremity Quality: Patient reports experiencing a dull pain to affected area(s). Severity: Patient states wound are getting worse. Duration: Patient has had the wound for < 2 weeks prior to presenting for treatment Timing: Pain in wound is Intermittent (comes and goes Context: The wound occurred when the patient had her door slammed into her right leg while she was at home about 2 weeks ago. Modifying Factors: Consults to this date include: application of hydrogen peroxide and Neosporin. Associated Signs and Symptoms: Patient reports having difficulty standing for long periods. this pleasant 81 year old who has been known to be a diabetic for the last 30 years has been injured with a door slamming into her leg about 2 weeks ago. The patient is a poor historian and we could not get  many records from her family practice doctor's office. She has been new using hydrogen peroxide and Neosporin on the wound and says that she squeezed out the scab and some pus. other comorbidities include high blood pressure and hypothyroidism. She says she checks her blood sugar everyday and it has been running fairly good and her last hemoglobin A1c done in November 2015 was 7.4. She has had no x-rays of her leg done nor she had any significant problems in the recent past. 04/05/2015 - No new complaints. No significant pain. No fever or chills. Improved drainage. Ambulating per her baseline with a cane. Objective Constitutional Pulse regular. Respirations normal and unlabored. Afebrile. NIMRIT, KEHRES ([id number]) Vitals Time Taken: 2:18 PM, Height: 62 in, Weight: 140 lbs, BMI: 25.6, Temperature: 98.2 F, Pulse: 63 bpm, Respiratory Rate: 18 breaths/min, Blood Pressure: 190/42 mmHg. Respiratory WNL. No retractions.. Cardiovascular Pedal Pulses WNL. Neurological Sensation normal to touch, pin,and vibration. Psychiatric Judgement and insight Intact.. Oriented times 3.. No evidence of depression, anxiety, or agitation.. General Notes: Right anterior calf ulceration. Full-thickness. No exposed deep structures. No probing to bone. No cellulitis. Localized edema. 1+ pitting edema in the distal calf. Faintly palpable DP. Integumentary (Hair, Skin) Wound #1 status is Open. Original cause of wound was Trauma. The wound is located on the Right Lower Leg. The wound measures 1.9cm length x 1.6cm width x 0.4cm depth; 2.388cm^2 area and 0.955cm^3 volume. The wound is limited to skin breakdown. There is no tunneling or undermining noted. There is a large amount of serosanguineous drainage noted. The wound margin is distinct with the outline attached to the wound base. There is medium (34-66%) red granulation within the wound bed. There is a medium (34- 66%) amount of necrotic tissue within the wound  bed including Adherent Slough. The periwound skin appearance exhibited: Localized Edema, Moist, Erythema. The periwound skin appearance did not exhibit: Callus, Crepitus, Excoriation, Fluctuance, Friable, Induration, Rash, Scarring, Dry/Scaly, Maceration, Atrophie Blanche, Cyanosis, Ecchymosis, Hemosiderin Staining, Mottled, Pallor, Rubor. The surrounding wound skin color is noted with erythema which is circumferential. Periwound temperature was noted as No Abnormality. 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 ([id number]) Right calf traumatic ulceration. Procedures Wound #1 Wound #1 is a Trauma, Other located on the Right Lower Leg . There was a Skin/Subcutaneous Tissue Debridement ([id number]) debridement with total area of 3.04 sq cm performed by BURNS, Teressa Senter., MD. with the following instrument(s): Curette  to remove Viable and Non-Viable tissue/material including Fat, Fibrin/Slough, and Subcutaneous after achieving pain control using Lidocaine 4% Topical Solution. A time out was conducted prior to the start of the procedure. A Minimum amount of bleeding was controlled with Pressure. The procedure was tolerated well with a pain level of 0 throughout and a pain level of 0 following the procedure. Post Debridement Measurements: 1.9cm length x 1.6cm width x 0.5cm depth; 1.194cm^3 volume. Plan Wound Cleansing: Wound #1 Right Lower Leg: Clean wound with Normal Saline. Anesthetic: Wound #1 Right Lower Leg: Topical Lidocaine 4% cream applied to wound bed prior to debridement Skin Barriers/Peri-Wound Care: Wound #1 Right Lower Leg: Skin Prep Primary Wound Dressing: Wound #1 Right Lower Leg: Aquacel Ag Secondary Dressing: Wound #1 Right Lower  Leg: Boardered Foam Dressing Dressing Change Frequency: Wound #1 Right Lower Leg: Change dressing every other day. Follow-up Appointments: Wound #1 Right Lower Leg: Return Appointment in 1 week. Edema Control: Wound #1 Right Lower Leg: Tubigrip Boyd, Shantee C. ([id number]) Continue silver alginate. Tubigrip for edema control. If no significant improvement will consider compression bandage. Electronic Signature(s) Signed: 04/05/2015 4:25:37 PM By: Loletha Grayer MD Entered By: Loletha Grayer on 04/05/2015 15:03:18 Michelle [name] ([id number]) -------------------------------------------------------------------------------- SuperBill Details Patient Name: Michelle [name] Date of Service: 04/05/2015 Medical Record Number: [id number] Patient Account Number: [id number] Date of Birth/Sex: 11/25/1931 (81 y.o. Female) Treating RN: Primary Care Physician: Reed Breech Other Clinician: Referring Physician: Reed Breech Treating Physician/Extender: BURNS, Charlean Sanfilippo in Treatment: 2 Diagnosis Coding ICD-10 Codes Code Description E11.622 Type 2 diabetes mellitus with other skin ulcer L97.812 Non-pressure chronic ulcer of other part of right lower leg with fat layer exposed S81.811A Laceration without foreign body, right lower leg, initial encounter Facility Procedures CPT4: Description Modifier Quantity Code [id number] 11042 - DEB SUBQ TISSUE 20 SQ CM/< 1 ICD-10 Description Diagnosis E11.622 Type 2 diabetes mellitus with other skin ulcer S81.811A Laceration without foreign body, right lower leg, initial encounter  G83.662 Non-pressure chronic ulcer of other part of right lower leg with fat layer exposed Physician Procedures CPT4: Description Modifier Quantity Code [id number] WC PHYS LEVEL 3 o NEW PT 1 ICD-10 Description Diagnosis E11.622 Type 2 diabetes mellitus with other skin ulcer L97.812 Non-pressure chronic ulcer of other part of right lower leg with fat layer exposed   S81.811A Laceration without foreign body, right lower leg, initial encounter CPT4: [id number] 11042 - WC PHYS SUBQ TISS 20 SQ CM 1 ICD-10 Description Diagnosis E11.622 Type 2 diabetes mellitus with other skin ulcer S81.811A Laceration without foreign body, right lower leg, initial encounter L97.812 Non-pressure chronic ulcer of  other part of right lower leg with fat layer exposed AUNDREYA, SOUFFRANT ([id number]) Electronic Signature(s) Signed: 04/05/2015 4:25:37 PM By: Loletha Grayer MD Entered By: Loletha Grayer on 04/05/2015 15:03:57

## 2015-04-12 ENCOUNTER — Encounter: Payer: Medicare Other | Attending: Surgery | Admitting: Surgery

## 2015-04-12 DIAGNOSIS — S81811A Laceration without foreign body, right lower leg, initial encounter: Secondary | ICD-10-CM | POA: Diagnosis not present

## 2015-04-12 DIAGNOSIS — E11622 Type 2 diabetes mellitus with other skin ulcer: Secondary | ICD-10-CM | POA: Insufficient documentation

## 2015-04-12 DIAGNOSIS — L97812 Non-pressure chronic ulcer of other part of right lower leg with fat layer exposed: Secondary | ICD-10-CM | POA: Insufficient documentation

## 2015-04-12 DIAGNOSIS — Y29XXXA Contact with blunt object, undetermined intent, initial encounter: Secondary | ICD-10-CM | POA: Diagnosis not present

## 2015-04-13 NOTE — Progress Notes (Signed)
LACHELLE, RISSLER ([id number]) Visit Report for 04/12/2015 Chief Complaint Document Details Patient Name: Michelle [name], Michelle [name]. Date of Service: 04/12/2015 1:45 PM Medical Record Number: [id number] Patient Account Number: [id number] Date of Birth/Sex: [birth year] (82 y.o. Female) Treating RN: Primary Care Physician: Reed Breech Other Clinician: Referring Physician: Reed Breech Treating Physician/Extender: Frann Rider in Treatment: 3 Information Obtained from: Patient Chief Complaint Patient presents to the wound care center for a consult due non healing wound. Pleasant 81 year old, comes with a injury to her right lower extremity which has been there for about 2 weeks. Electronic Signature(s) Signed: 04/12/2015 4:40:03 PM By: Christin Fudge MD, FACS Entered By: Christin Fudge on 04/12/2015 15:19:01 Michelle [name] ([id number]) -------------------------------------------------------------------------------- Debridement Details Patient Name: Michelle [name] Date of Service: 04/12/2015 1:45 PM Medical Record Number: [id number] Patient Account Number: [id number] Date of Birth/Sex: 07-14-1932 (82 y.o. Female) Treating RN: Primary Care Physician: Reed Breech Other Clinician: Referring Physician: Reed Breech Treating Physician/Extender: Frann Rider in Treatment: 3 Debridement Performed for Wound #1 Right Lower Leg Assessment: Performed By: Physician Pat Patrick., MD Debridement: Debridement Pre-procedure Yes Verification/Time Out Taken: Start Time: 14:23 Pain Control: Lidocaine 4% Topical Solution Level: Skin/Subcutaneous Tissue Total Area Debrided (L x 2.2 (cm) x 1.5 (cm) = 3.3 (cm) W): Tissue and other Viable, Non-Viable, Exudate, Fibrin/Slough, Subcutaneous material debrided: Instrument: Forceps, Other : gauze Bleeding: Minimum Hemostasis Achieved: Pressure End Time: 14:27 Procedural Pain: 0 Post Procedural Pain: 0 Response to  Treatment: Procedure was tolerated well Post Debridement Measurements of Total Wound Length: (cm) 2.2 Width: (cm) 1.5 Depth: (cm) 0.2 Volume: (cm) 0.518 Electronic Signature(s) Signed: 04/12/2015 4:40:03 PM By: Christin Fudge MD, FACS Entered By: Christin Fudge on 04/12/2015 15:18:51 Michelle [name] ([id number]) -------------------------------------------------------------------------------- HPI Details Patient Name: Michelle [name] Date of Service: 04/12/2015 1:45 PM Medical Record Number: [id number] Patient Account Number: [id number] Date of Birth/Sex: [birth year] (82 y.o. Female) Treating RN: Primary Care Physician: Reed Breech Other Clinician: Referring Physician: Reed Breech Treating Physician/Extender: Frann Rider in Treatment: 3 History of Present Illness Location: right lower extremity Quality: Patient reports experiencing a dull pain to affected area(s). Severity: Patient states wound are getting worse. Duration: Patient has had the wound for < 2 weeks prior to presenting for treatment Timing: Pain in wound is Intermittent (comes and goes Context: The wound occurred when the patient had her door slammed into her right leg while she was at home about 2 weeks ago. Modifying Factors: Consults to this date include: application of hydrogen peroxide and Neosporin. Associated Signs and Symptoms: Patient reports having difficulty standing for long periods. HPI Description: this pleasant 81 year old who has been known to be a diabetic for the last 30 years has been injured with a door slamming into her leg about 2 weeks ago. The patient is a poor historian and we could not get many records from her family practice doctor's office. She has been new using hydrogen peroxide and Neosporin on the wound and says that she squeezed out the scab and some pus. other comorbidities include high blood pressure and hypothyroidism. She says she checks her blood  sugar everyday and it has been running fairly good and her last hemoglobin A1c done in November 2015 was 7.4. She has had no x-rays of her leg done nor she had any significant problems in the recent past. 04/05/2015 - No new complaints. No significant pain. No fever or chills. Improved drainage. Ambulating per her baseline with a cane.  Electronic Signature(s) Signed: 04/12/2015 4:40:03 PM By: Christin Fudge MD, FACS Entered By: Christin Fudge on 04/12/2015 15:19:10 Michelle [name] ([id number]) -------------------------------------------------------------------------------- Physical Exam Details Patient Name: Michelle [name] Date of Service: 04/12/2015 1:45 PM Medical Record Number: [id number] Patient Account Number: [id number] Date of Birth/Sex: 25-Dec-1931 (82 y.o. Female) Treating RN: Primary Care Physician: Reed Breech Other Clinician: Referring Physician: Reed Breech Treating Physician/Extender: Frann Rider in Treatment: 3 Constitutional . Pulse regular. Respirations normal and unlabored. Afebrile. . Eyes Nonicteric. Reactive to light. Ears, Nose, Mouth, and Throat Lips, teeth, and gums WNL.Marland Kitchen Moist mucosa without lesions . Neck supple and nontender. No palpable supraclavicular or cervical adenopathy. Normal sized without goiter. Respiratory WNL. No retractions.. Cardiovascular Pedal Pulses WNL. No clubbing, cyanosis or edema. Integumentary (Hair, Skin) the wound has granulated a bit but the patient has got some slough which needs to be sharply debrided.. No crepitus or fluctuance. No peri-wound warmth or erythema. No masses.Marland Kitchen Psychiatric Judgement and insight Intact.. No evidence of depression, anxiety, or agitation.. Electronic Signature(s) Signed: 04/12/2015 4:40:03 PM By: Christin Fudge MD, FACS Entered By: Christin Fudge on 04/12/2015 15:19:45 Michelle [name]  ([id number]) -------------------------------------------------------------------------------- Physician Orders Details Patient Name: Michelle [name] Date of Service: 04/12/2015 1:45 PM Medical Record Number: [id number] Patient Account Number: [id number] Date of Birth/Sex: 02-21-1932 (82 y.o. Female) Treating RN: Montey Hora Primary Care Physician: Reed Breech Other Clinician: Referring Physician: Reed Breech Treating Physician/Extender: Frann Rider in Treatment: 3 Verbal / Phone Orders: Yes Clinician: Montey Hora Read Back and Verified: Yes Diagnosis Coding Wound Cleansing Wound #1 Right Lower Leg o Clean wound with Normal Saline. Anesthetic Wound #1 Right Lower Leg o Topical Lidocaine 4% cream applied to wound bed prior to debridement Skin Barriers/Peri-Wound Care Wound #1 Right Lower Leg o Skin Prep Primary Wound Dressing Wound #1 Right Lower Leg o Aquacel Ag Secondary Dressing Wound #1 Right Lower Leg o Boardered Foam Dressing Dressing Change Frequency Wound #1 Right Lower Leg o Change dressing every other day. Follow-up Appointments Wound #1 Right Lower Leg o Return Appointment in 1 week. Edema Control Wound #1 Right Lower Leg o Tubigrip Electronic Signature(s) Michelle [name], Michelle [name] ([id number]) Signed: 04/12/2015 4:40:03 PM By: Christin Fudge MD, FACS Signed: 04/12/2015 5:10:45 PM By: Montey Hora Entered By: Montey Hora on 04/12/2015 14:26:34 Michelle [name] ([id number]) -------------------------------------------------------------------------------- Problem List Details Patient Name: Michelle [name]. Date of Service: 04/12/2015 1:45 PM Medical Record Number: [id number] Patient Account Number: [id number] Date of Birth/Sex: [birth year] (82 y.o. 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 Female) Treating RN: Primary Care Physician: Reed Breech Other Clinician: Referring Physician: Reed Breech Treating Physician/Extender: Frann Rider in Treatment: 3 Subjective Chief Complaint Information obtained from Patient Patient presents to the wound care center for  a consult due non healing wound. Pleasant 81 year old, comes with a injury to her right lower extremity which has been there for about 2 weeks. History of Present Illness (HPI) The following HPI elements were documented for the patient's wound: Location: right lower extremity Quality: Patient reports experiencing a dull pain to affected area(s). Severity: Patient states wound are getting worse. Duration: Patient has had the wound for < 2 weeks prior to presenting for treatment Timing: Pain in wound is Intermittent (comes and goes Context: The wound occurred when the patient had her door slammed into her right leg while she was at home about 2 weeks ago. Modifying Factors: Consults to this date include: application of hydrogen peroxide and Neosporin. Associated Signs and Symptoms:  Patient reports having difficulty standing for long periods. this pleasant 81 year old who has been known to be a diabetic for the last 30 years has been injured with a door slamming into her leg about 2 weeks ago. The patient is a poor historian and we could not get many records from her family practice doctor's office. She has been new using hydrogen peroxide and Neosporin on the wound and says that she squeezed out the scab and some pus. other comorbidities include high blood pressure and hypothyroidism. She says she checks her blood sugar everyday and it has been running fairly good and her last hemoglobin A1c done in November 2015 was 7.4. She has had no x-rays of her leg done nor she had any significant problems in the recent past. 04/05/2015 - No new complaints. No significant pain. No fever or chills. Improved drainage. Ambulating per her baseline with a cane. Objective Constitutional Pulse regular. Respirations normal and unlabored. Afebrile. Michelle [name], Michelle [name] ([id number]) Vitals Time Taken: 2:15 PM, Height: 62 in, Weight: 140 lbs, BMI: 25.6, Temperature: 97.6 F, Pulse: 68 bpm, Respiratory Rate: 16 breaths/min, Blood Pressure: 121/76 mmHg. Eyes Nonicteric. Reactive to light. Ears, Nose, Mouth, and Throat Lips, teeth, and gums WNL.Marland Kitchen Moist mucosa without lesions . Neck supple and nontender. No palpable supraclavicular or cervical adenopathy. Normal sized without goiter. Respiratory WNL. No retractions.. Cardiovascular Pedal Pulses WNL. No clubbing, cyanosis or edema. Psychiatric Judgement and insight Intact.. No evidence of depression, anxiety, or agitation.. Integumentary (Hair, Skin) the wound has granulated a bit but the patient has got some slough which needs to be sharply debrided.. No crepitus or fluctuance. No peri-wound warmth or erythema. No masses.. Wound #1 status is Open. Original cause of wound was Trauma. The wound is located on the Right Lower Leg. The wound  measures 2.2cm length x 1.5cm width x 0.2cm depth; 2.592cm^2 area and 0.518cm^3 volume. The wound is limited to skin breakdown. There is no tunneling or undermining noted. There is a medium amount of serosanguineous drainage noted. The wound margin is distinct with the outline attached to the wound base. There is medium (34-66%) red granulation within the wound bed. There is a medium (34-66%) amount of necrotic tissue within the wound bed including Adherent Slough. The periwound skin appearance exhibited: Localized Edema, Moist, Erythema. The periwound skin appearance did not exhibit: Callus, Crepitus, Excoriation, Fluctuance, Friable, Induration, Rash, Scarring, Dry/Scaly, Maceration, Atrophie Blanche, Cyanosis, Ecchymosis, Hemosiderin Staining, Mottled, Pallor, Rubor. The surrounding wound skin color is noted with erythema which is circumferential. Periwound temperature was noted as No Abnormality. 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 ([id number])  C37.628B - Laceration without foreign body, right lower leg, initial encounter The patient has not received the proper supplies and has not been doing her dressing as advised. We'll continue packing with silver alginate rope and we will get the dressing done on alternate days. She will come back and see [place] next week. Procedures Wound #1 Wound #1 is a Trauma, Other located on the Right Lower Leg . There was a Skin/Subcutaneous Tissue Debridement ([id number]) debridement with total area of 3.3 sq cm performed by Soley Harriss, Jackson Latino., MD. with the following instrument(s): Forceps and gauze to remove Viable and Non-Viable tissue/material including Exudate, Fibrin/Slough, and Subcutaneous after achieving pain control using Lidocaine 4% Topical Solution. A time out was conducted prior to the  start of the procedure. A Minimum amount of bleeding was controlled with Pressure. The procedure was tolerated well with a pain level of 0 throughout and a pain level of 0 following the procedure. Post Debridement Measurements: 2.2cm length x 1.5cm width x 0.2cm depth; 0.518cm^3 volume. Plan Wound Cleansing: Wound #1 Right Lower Leg: Clean wound with Normal Saline. Anesthetic: Wound #1 Right Lower Leg: Topical Lidocaine 4% cream applied to wound bed prior to debridement Skin Barriers/Peri-Wound Care: Wound #1 Right Lower Leg: Skin Prep Primary Wound Dressing: Wound #1 Right Lower Leg: Aquacel Ag Secondary Dressing: Wound #1 Right Lower Leg: Boardered Foam Dressing Dressing Change Frequency: Wound #1 Right Lower Leg: Change dressing every other day. Michelle [name], Michelle [name] ([id number]) Follow-up Appointments: Wound #1 Right Lower Leg: Return Appointment in 1 week. Edema Control: Wound #1 Right Lower Leg: Tubigrip The patient has not received the proper supplies and has not been doing her dressing as advised. We'll continue packing with silver alginate rope and we will get the dressing done on alternate days. She will come back and see [place] next week. Electronic Signature(s) Signed: 04/12/2015 4:40:03 PM By: Christin Fudge MD, FACS Entered By: Christin Fudge on 04/12/2015 15:20:52 Michelle [name] ([id number]) -------------------------------------------------------------------------------- SuperBill Details Patient Name: Michelle [name] Date of Service: 04/12/2015 Medical Record Number: [id number] Patient Account Number: [id number] Date of Birth/Sex: Aug 13, 1932 (81 y.o. Female) Treating RN: Primary Care Physician: Reed Breech Other Clinician: Referring Physician: Reed Breech Treating Physician/Extender: Frann Rider in Treatment: 3 Diagnosis Coding ICD-10 Codes Code Description E11.622 Type 2 diabetes mellitus with other skin ulcer L97.812 Non-pressure  chronic ulcer of other part of right lower leg with fat layer exposed S81.811A Laceration without foreign body, right lower leg, initial encounter Facility Procedures CPT4: Description Modifier Quantity Code [id number] 11042 - DEB SUBQ TISSUE 20 SQ CM/< 1 ICD-10 Description Diagnosis E11.622 Type 2 diabetes mellitus with other skin ulcer L97.812 Non-pressure chronic ulcer of other part of right lower leg with fat layer  exposed S81.811A Laceration without foreign body, right lower leg, initial encounter Physician Procedures CPT4: Description Modifier Quantity Code [id number] 11042 - WC PHYS SUBQ TISS 20 SQ CM 1 ICD-10 Description Diagnosis E11.622 Type 2 diabetes mellitus with other skin ulcer L97.812 Non-pressure chronic ulcer of other part of right lower leg with fat layer  exposed S81.811A Laceration without foreign body, right lower leg, initial encounter Electronic Signature(s) Signed: 04/12/2015 4:40:03 PM By: Christin Fudge MD, FACS Entered By: Christin Fudge on 04/12/2015 15:21:08

## 2015-04-13 NOTE — Progress Notes (Signed)
Michelle [name], Michelle [name] ([id number]) Visit Report for 04/12/2015 Arrival Information Details Patient Name: Michelle [name], Michelle [name]. Date of Service: 04/12/2015 1:45 PM Medical Record Number: [id number] Patient Account Number: [id number] Date of Birth/Sex: [birth year] (82 y.o. Female) Treating RN: Afful, RN, BSN, Velva Harman Primary Care Physician: Reed Breech Other Clinician: Referring Physician: Reed Breech Treating Physician/Extender: Frann Rider in Treatment: 3 Visit Information History Since Last Visit Any new allergies or adverse reactions: No Patient Arrived: Kasandra Knudsen Had a fall or experienced change in No Arrival Time: 14:13 activities of daily living that may affect Accompanied By: SEF risk of falls: Transfer Assistance: None Signs or symptoms of abuse/neglect since last No Patient Identification Verified: Yes visito Secondary Verification Process Yes Hospitalized since last visit: No Completed: Has Dressing in Place as Prescribed: Yes Patient Has Alerts: Yes Pain Present Now: No Patient Alerts: DMII ABI >220 Miller Electronic Signature(s) Signed: 04/12/2015 4:46:46 PM By: Regan Lemming BSN, RN Entered By: Regan Lemming on 04/12/2015 14:13:31 Michelle [name] ([id number]) -------------------------------------------------------------------------------- Encounter Discharge Information Details Patient Name: Michelle [name] Date of Service: 04/12/2015 1:45 PM Medical Record Number: [id number] Patient Account Number: [id number] Date of Birth/Sex: 12/21/1931 (82 y.o. Female) Treating RN: Primary Care Physician: Reed Breech Other Clinician: Referring Physician: Reed Breech Treating Physician/Extender: Frann Rider in Treatment: 3 Encounter Discharge Information Items Schedule Follow-up Appointment: No Medication Reconciliation completed No and provided to Patient/Care Kennia Vanvorst: Provided on Clinical Summary of Care: 04/12/2015 Form Type Recipient Paper  Patient ML Electronic Signature(s) Signed: 04/12/2015 2:39:56 PM By: Ruthine Dose Entered By: Ruthine Dose on 04/12/2015 14:39:56 Michelle [name] ([id number]) -------------------------------------------------------------------------------- Lower Extremity Assessment Details Patient Name: Michelle [name] Date of Service: 04/12/2015 1:45 PM Medical Record Number: [id number] Patient Account Number: [id number] Date of Birth/Sex: 1932-03-01 (82 y.o. Female) Treating RN: Afful, RN, BSN, Velva Harman Primary Care Physician: Reed Breech Other Clinician: Referring Physician: Reed Breech Treating Physician/Extender: Frann Rider in Treatment: 3 Edema Assessment Assessed: [Left: No] [Right: No] E[Left: dema] [Right: :] Calf Left: Right: Point of Measurement: 36 cm From Medial Instep cm 37.8 cm Ankle Left: Right: Point of Measurement: 8 cm From Medial Instep cm 22 cm Vascular Assessment Claudication: Claudication Assessment [Right:None] Pulses: Posterior Tibial Dorsalis Pedis Palpable: [Right:Yes] Extremity colors, hair growth, and conditions: Extremity Color: [Right:Normal] Hair Growth on Extremity: [Right:No] Temperature of Extremity: [Right:Warm] Capillary Refill: [Right:< 3 seconds] Dependent Rubor: [Right:No] Blanched when Elevated: [Right:No] Toe Nail Assessment Left: Right: Thick: Yes Deformed: No Improper Length and Hygiene: No Electronic Signature(s) Signed: 04/12/2015 4:46:46 PM By: Regan Lemming BSN, RN Michelle [name], Michelle [name] ([id number]) Entered By: Regan Lemming on 04/12/2015 14:17:54 Michelle [name] ([id number]) -------------------------------------------------------------------------------- Multi Wound Chart Details Patient Name: Michelle [name] Date of Service: 04/12/2015 1:45 PM Medical Record Number: [id number] Patient Account Number: [id number] Date of Birth/Sex: 1932-08-22 (82 y.o. Female) Treating RN: Montey Hora Primary Care Physician: Reed Breech Other Clinician: Referring Physician: Reed Breech Treating Physician/Extender: Frann Rider in Treatment: 3 Vital Signs Height(in): 62 Pulse(bpm): 68 Weight(lbs): 140 Blood Pressure 121/76 (mmHg): Body Mass Index(BMI): 26 Temperature(F): 97.6 Respiratory Rate 16 (breaths/min): Photos: [1:No Photos] [N/A:N/A] Wound Location: [1:Right Lower Leg] [N/A:N/A] Wounding Event: [1:Trauma] [N/A:N/A] Primary Etiology: [1:Trauma, Other] [N/A:N/A] Comorbid History: [1:Chronic Obstructive Pulmonary Disease (COPD), Arrhythmia, Hypertension, Type II Diabetes] [N/A:N/A] Date Acquired: [1:03/05/2015] [N/A:N/A] Weeks of Treatment: [1:3] [N/A:N/A] Wound Status: [1:Open] [N/A:N/A] Measurements L x W x D 2.2x1.5x0.2 [N/A:N/A] (cm) Area (cm) : [1:2.592] [N/A:N/A] Volume (cm) : [1:0.518] [  N/A:N/A] % Reduction in Area: [1:23.60%] [N/A:N/A] % Reduction in Volume: 86.10% [N/A:N/A] Classification: [1:Full Thickness Without Exposed Support Structures] [N/A:N/A] HBO Classification: [1:Grade 2] [N/A:N/A] Exudate Amount: [1:Medium] [N/A:N/A] Exudate Type: [1:Serosanguineous] [N/A:N/A] Exudate Color: [1:red, brown] [N/A:N/A] Wound Margin: [1:Distinct, outline attached] [N/A:N/A] Granulation Amount: [1:Medium (34-66%)] [N/A:N/A] Granulation Quality: [1:Red] [N/A:N/A] Necrotic Amount: [1:Medium (34-66%)] [N/A:N/A] Exposed Structures: [N/A:N/A] Fascia: No Fat: No Tendon: No Muscle: No Joint: No Bone: No Limited to Skin Breakdown Epithelialization: None N/A N/A Periwound Skin Texture: Edema: Yes N/A N/A Excoriation: No Induration: No Callus: No Crepitus: No Fluctuance: No Friable: No Rash: No Scarring: No Periwound Skin Moist: Yes N/A N/A Moisture: Maceration: No Dry/Scaly: No Periwound Skin Color: Erythema: Yes N/A N/A Atrophie Blanche: No Cyanosis: No Ecchymosis: No Hemosiderin Staining: No Mottled: No Pallor: No Rubor: No Erythema Location:  Circumferential N/A N/A Temperature: No Abnormality N/A N/A Tenderness on No N/A N/A Palpation: Wound Preparation: Ulcer Cleansing: N/A N/A Rinsed/Irrigated with Saline Topical Anesthetic Applied: Other: lidocaine 4% Treatment Notes Electronic Signature(s) Signed: 04/12/2015 5:10:45 PM By: Montey Hora Entered By: Montey Hora on 04/12/2015 14:26:12 Michelle [name] ([id number]) -------------------------------------------------------------------------------- [place] Details Patient Name: Michelle [name] Date of Service: 04/12/2015 1:45 PM Medical Record Number: [id number] Patient Account Number: [id number] Date of Birth/Sex: [birth year] (82 y.o. Female) Treating RN: Montey Hora Primary Care Physician: Reed Breech Other Clinician: Referring Physician: Reed Breech Treating Physician/Extender: Frann Rider in Treatment: 3 Active Inactive Abuse / Safety / Falls / Self Care Management Nursing Diagnoses: Impaired physical mobility Potential for falls Goals: Patient will remain injury free Date Initiated: 03/20/2015 Goal Status: Active Patient/caregiver will verbalize understanding of skin care regimen Date Initiated: 03/20/2015 Goal Status: Active Patient/caregiver will verbalize/demonstrate measures taken to prevent injury and/or falls Date Initiated: 03/20/2015 Goal Status: Active Patient/caregiver will verbalize/demonstrate understanding of what to do in case of emergency Date Initiated: 03/20/2015 Goal Status: Active Interventions: Assess fall risk on admission and as needed Provide education on fall prevention Provide education on safe transfers Notes: Nutrition Nursing Diagnoses: Potential for alteratiion in Nutrition/Potential for imbalanced nutrition Goals: Patient/caregiver verbalizes understanding of need to maintain therapeutic glucose control per primary care physician Date Initiated: 03/20/2015 Michelle [name], Michelle [name]  ([id number]) Goal Status: Active Patient/caregiver will maintain therapeutic glucose control Date Initiated: 03/20/2015 Goal Status: Active Interventions: Assess HgA1c results as ordered upon admission and as needed Provide education on elevated blood sugars and impact on wound healing Provide education on nutrition Treatment Activities: Obtain HgA1c : 04/12/2015 Notes: Orientation to the Wound Care Program Nursing Diagnoses: Knowledge deficit related to the wound healing center program Goals: Patient/caregiver will verbalize understanding of the [facility] Date Initiated: 03/20/2015 Goal Status: Active Interventions: Provide education on orientation to the wound center Notes: Wound/Skin Impairment Nursing Diagnoses: Impaired tissue integrity Knowledge deficit related to ulceration/compromised skin integrity Goals: Patient/caregiver will verbalize understanding of skin care regimen Date Initiated: 03/20/2015 Goal Status: Active Ulcer/skin breakdown will heal within 14 weeks Date Initiated: 03/20/2015 Goal Status: Active Interventions: Assess patient/caregiver ability to obtain necessary supplies Assess patient/caregiver ability to perform ulcer/skin care regimen upon admission and as needed Michelle [name], SETO. ([id number]) Assess ulceration(s) every visit Provide education on ulcer and skin care Notes: Electronic Signature(s) Signed: 04/12/2015 5:10:45 PM By: Montey Hora Entered By: Montey Hora on 04/12/2015 14:25:20 Michelle [name] ([id number]) -------------------------------------------------------------------------------- Pain Assessment Details Patient Name: Michelle [name] Date of Service: 04/12/2015 1:45 PM Medical Record Number: [id number] Patient Account Number: [id number] Date of Birth/Sex: Nov 27, 1931 (  81 y.o. Female) Treating RN: Baruch Gouty, RN, BSN, Velva Harman Primary Care Physician: Reed Breech Other Clinician: Referring Physician: Reed Breech Treating Physician/Extender: Frann Rider in Treatment: 3 Active Problems Location of Pain Severity and Description of Pain Patient Has Paino No Site Locations Pain Management and Medication Current Pain Management: Electronic Signature(s) Signed: 04/12/2015 4:46:46 PM By: Regan Lemming BSN, RN Entered By: Regan Lemming on 04/12/2015 14:13:37 Michelle [name] ([id number]) -------------------------------------------------------------------------------- Patient/Caregiver Education Details Patient Name: Michelle [name] Date of Service: 04/12/2015 1:45 PM Medical Record Number: [id number] Patient Account Number: [id number] Date of Birth/Gender: 15-Jul-1932 (81 y.o. Female) Treating RN: Montey Hora Primary Care Physician: Reed Breech Other Clinician: Referring Physician: Reed Breech Treating Physician/Extender: Frann Rider in Treatment: 3 Education Assessment Education Provided To: Patient Education Topics Provided Wound/Skin Impairment: Handouts: Other: wound care as ordered Methods: Explain/Verbal Responses: State content correctly Electronic Signature(s) Signed: 04/12/2015 5:10:45 PM By: Montey Hora Entered By: Montey Hora on 04/12/2015 14:28:14 Michelle [name] ([id number]) -------------------------------------------------------------------------------- Wound Assessment Details Patient Name: Michelle [name] Date of Service: 04/12/2015 1:45 PM Medical Record Number: [id number] Patient Account Number: [id number] Date of Birth/Sex: [birth year] (82 y.o. Female) Treating RN: Afful, RN, BSN, Velva Harman Primary Care Physician: Reed Breech Other Clinician: Referring Physician: Reed Breech Treating Physician/Extender: Frann Rider in Treatment: 3 Wound Status Wound Number: 1 Primary Trauma, Other Etiology: Wound Location: Right Lower Leg Wound Open Wounding Event: Trauma Status: Date Acquired: 03/05/2015 Comorbid  Chronic Obstructive Pulmonary Weeks Of Treatment: 3 History: Disease (COPD), Arrhythmia, Clustered Wound: No Hypertension, Type II Diabetes Photos Photo Uploaded By: Regan Lemming on 04/12/2015 16:43:11 Wound Measurements Length: (cm) 2.2 Width: (cm) 1.5 Depth: (cm) 0.2 Area: (cm) 2.592 Volume: (cm) 0.518 % Reduction in Area: 23.6% % Reduction in Volume: 86.1% Epithelialization: None Tunneling: No Undermining: No Wound Description Full Thickness Without Exposed Foul Odor Af Classification: Support Structures Diabetic Severity Grade 2 (Wagner): Wound Margin: Distinct, outline attached Exudate Amount: Medium Exudate Type: Serosanguineous Exudate Color: red, brown ter Cleansing: No Wound Bed Granulation Amount: Medium (34-66%) Exposed Structure Michelle [name], Michelle C. ([id number]) Granulation Quality: Red Fascia Exposed: No Necrotic Amount: Medium (34-66%) Fat Layer Exposed: No Necrotic Quality: Adherent Slough Tendon Exposed: No Muscle Exposed: No Joint Exposed: No Bone Exposed: No Limited to Skin Breakdown Periwound Skin Texture Texture Color No Abnormalities Noted: No No Abnormalities Noted: No Callus: No Atrophie Blanche: No Crepitus: No Cyanosis: No Excoriation: No Ecchymosis: No Fluctuance: No Erythema: Yes Friable: No Erythema Location: Circumferential Induration: No Hemosiderin Staining: No Localized Edema: Yes Mottled: No Rash: No Pallor: No Scarring: No Rubor: No Moisture Temperature / Pain No Abnormalities Noted: No Temperature: No Abnormality Dry / Scaly: No Maceration: No Moist: Yes Wound Preparation Ulcer Cleansing: Rinsed/Irrigated with Saline Topical Anesthetic Applied: Other: lidocaine 4%, Electronic Signature(s) Signed: 04/12/2015 4:46:46 PM By: Regan Lemming BSN, RN Entered By: Regan Lemming on 04/12/2015 14:16:55 Michelle [name] ([id number]) -------------------------------------------------------------------------------- Vitals  Details Patient Name: Michelle [name] Date of Service: 04/12/2015 1:45 PM Medical Record Number: [id number] Patient Account Number: [id number] Date of Birth/Sex: 1932/03/31 (82 y.o. Female) Treating RN: Afful, RN, BSN, Velva Harman Primary Care Physician: Reed Breech Other Clinician: Referring Physician: Reed Breech Treating Physician/Extender: Frann Rider in Treatment: 3 Vital Signs Time Taken: 14:15 Temperature (F): 97.6 Height (in): 62 Pulse (bpm): 68 Weight (lbs): 140 Respiratory Rate (breaths/min): 16 Body Mass Index (BMI): 25.6 Blood Pressure (mmHg): 121/76 Reference Range: 80 - 120 mg / dl Electronic  Signature(s) Signed: 04/12/2015 4:46:46 PM By: Regan Lemming BSN, RN Entered By: Regan Lemming on 04/12/2015 14:18:18

## 2015-04-17 ENCOUNTER — Other Ambulatory Visit: Payer: Self-pay

## 2015-04-17 DIAGNOSIS — Z803 Family history of malignant neoplasm of breast: Secondary | ICD-10-CM

## 2015-04-17 DIAGNOSIS — Z1231 Encounter for screening mammogram for malignant neoplasm of breast: Secondary | ICD-10-CM

## 2015-04-23 ENCOUNTER — Ambulatory Visit
Admission: RE | Admit: 2015-04-23 | Discharge: 2015-04-23 | Disposition: A | Discharge status: Home / Self Care | Payer: Medicare Other | Source: Ambulatory Visit

## 2015-04-23 DIAGNOSIS — Z803 Family history of malignant neoplasm of breast: Secondary | ICD-10-CM

## 2015-04-23 DIAGNOSIS — Z1231 Encounter for screening mammogram for malignant neoplasm of breast: Secondary | ICD-10-CM

## 2015-04-26 ENCOUNTER — Encounter: Payer: Medicare Other | Admitting: Surgery

## 2015-04-26 DIAGNOSIS — E11622 Type 2 diabetes mellitus with other skin ulcer: Secondary | ICD-10-CM | POA: Diagnosis not present

## 2015-04-26 DIAGNOSIS — S81811A Laceration without foreign body, right lower leg, initial encounter: Secondary | ICD-10-CM | POA: Diagnosis not present

## 2015-04-26 DIAGNOSIS — L97812 Non-pressure chronic ulcer of other part of right lower leg with fat layer exposed: Secondary | ICD-10-CM | POA: Diagnosis not present

## 2015-04-27 NOTE — Progress Notes (Signed)
Michelle, MOLLER ([id number]) Visit Report for 04/26/2015 Arrival Information Details Patient Name: Michelle [name], Michelle [name]. Date of Service: 04/26/2015 2:30 PM Medical Record Number: [id number] Patient Account Number: [id number] Date of Birth/Sex: Jan 20, 1932 (81 y.o. Female) Treating RN: Montey Hora Primary Care Physician: Reed Breech Other Clinician: Referring Physician: Reed Breech Treating Physician/Extender: Frann Rider in Treatment: 5 Visit Information History Since Last Visit Added or deleted any medications: No Patient Arrived: Cane Any new allergies or adverse reactions: No Arrival Time: 14:58 Had a fall or experienced change in No Accompanied By: self activities of daily living that may affect Transfer Assistance: None risk of falls: Patient Identification Verified: Yes Signs or symptoms of abuse/neglect since last No Secondary Verification Process Yes visito Completed: Hospitalized since last visit: No Patient Has Alerts: Yes Pain Present Now: No Patient Alerts: DMII ABI >220 [place] Electronic Signature(s) Signed: 04/26/2015 5:00:03 PM By: Montey Hora Entered By: Montey Hora on 04/26/2015 15:02:33 Michelle [name] ([id number]) -------------------------------------------------------------------------------- Clinic Level of Care Assessment Details Patient Name: Michelle [name] Date of Service: 04/26/2015 2:30 PM Medical Record Number: [id number] Patient Account Number: [id number] Date of Birth/Sex: 12-17-1931 (81 y.o. Female) Treating RN: Montey Hora Primary Care Physician: Reed Breech Other Clinician: Referring Physician: Reed Breech Treating Physician/Extender: Frann Rider in Treatment: 5 Clinic Level of Care Assessment Items TOOL 4 Quantity Score []  - Use when only an EandM is performed on FOLLOW-UP visit 0 ASSESSMENTS - Nursing Assessment / Reassessment X - Reassessment of Co-morbidities (includes updates in  patient status) 1 10 X - Reassessment of Adherence to Treatment Plan 1 5 ASSESSMENTS - Wound and Skin Assessment / Reassessment X - Simple Wound Assessment / Reassessment - one wound 1 5 []  - Complex Wound Assessment / Reassessment - multiple wounds 0 []  - Dermatologic / Skin Assessment (not related to wound area) 0 ASSESSMENTS - Focused Assessment X - Circumferential Edema Measurements - multi extremities 1 5 []  - Nutritional Assessment / Counseling / Intervention 0 X - Lower Extremity Assessment (monofilament, tuning fork, pulses) 1 5 []  - Peripheral Arterial Disease Assessment (using hand held doppler) 0 ASSESSMENTS - Ostomy and/or Continence Assessment and Care []  - Incontinence Assessment and Management 0 []  - Ostomy Care Assessment and Management (repouching, etc.) 0 PROCESS - Coordination of Care X - Simple Patient / Family Education for ongoing care 1 15 []  - Complex (extensive) Patient / Family Education for ongoing care 0 []  - Staff obtains Programmer, systems, Records, Test Results / Process Orders 0 []  - Staff telephones HHA, Nursing Homes / Clarify orders / etc 0 []  - Routine Transfer to another Facility (non-emergent condition) 0 Hari, Ashelyn C. ([id number]) []  - Routine Hospital Admission (non-emergent condition) 0 []  - New Admissions / Biomedical engineer / Ordering NPWT, Apligraf, etc. 0 []  - Emergency Hospital Admission (emergent condition) 0 X - Simple Discharge Coordination 1 10 []  - Complex (extensive) Discharge Coordination 0 PROCESS - Special Needs []  - Pediatric / Minor Patient Management 0 []  - Isolation Patient Management 0 []  - Hearing / Language / Visual special needs 0 []  - Assessment of Community assistance (transportation, D/C planning, etc.) 0 []  - Additional assistance / Altered mentation 0 []  - Support Surface(s) Assessment (bed, cushion, seat, etc.) 0 INTERVENTIONS - Wound Cleansing / Measurement X - Simple Wound Cleansing - one wound 1 5 []  - Complex  Wound Cleansing - multiple wounds 0 X - Wound Imaging (photographs - any number of wounds) 1 5 []  - Wound  Tracing (instead of photographs) 0 X - Simple Wound Measurement - one wound 1 5 []  - Complex Wound Measurement - multiple wounds 0 INTERVENTIONS - Wound Dressings X - Small Wound Dressing one or multiple wounds 1 10 []  - Medium Wound Dressing one or multiple wounds 0 []  - Large Wound Dressing one or multiple wounds 0 []  - Application of Medications - topical 0 []  - Application of Medications - injection 0 INTERVENTIONS - Miscellaneous []  - External ear exam 0 Dost, Peaches C. ([id number]) []  - Specimen Collection (cultures, biopsies, blood, body fluids, etc.) 0 []  - Specimen(s) / Culture(s) sent or taken to Lab for analysis 0 []  - Patient Transfer (multiple staff / Harrel Lemon Lift / Similar devices) 0 []  - Simple Staple / Suture removal (25 or less) 0 []  - Complex Staple / Suture removal (26 or more) 0 []  - Hypo / Hyperglycemic Management (close monitor of Blood Glucose) 0 []  - Ankle / Brachial Index (ABI) - do not check if billed separately 0 X - Vital Signs 1 5 Has the patient been seen at the hospital within the last three years: Yes Total Score: 85 Level Of Care: New/Established - Level 3 Electronic Signature(s) Signed: 04/26/2015 4:32:01 PM By: Montey Hora Entered By: Montey Hora on 04/26/2015 16:32:01 Michelle [name] ([id number]) -------------------------------------------------------------------------------- Encounter Discharge Information Details Patient Name: Michelle [name] Date of Service: 04/26/2015 2:30 PM Medical Record Number: [id number] Patient Account Number: [id number] Date of Birth/Sex: 14-Jan-1932 (81 y.o. Female) Treating RN: Primary Care Physician: Reed Breech Other Clinician: Referring Physician: Reed Breech Treating Physician/Extender: Frann Rider in Treatment: 5 Encounter Discharge Information Items Discharge Pain Level:  0 Discharge Condition: Stable Ambulatory Status: Cane Discharge Destination: Home Transportation: Private Auto Accompanied By: self Schedule Follow-up Appointment: Yes Medication Reconciliation completed No and provided to Patient/Care Mikelle Myrick: Provided on Clinical Summary of Care: 04/26/2015 Form Type Recipient Paper Patient ML Electronic Signature(s) Signed: 04/26/2015 4:33:15 PM By: Montey Hora Previous Signature: 04/26/2015 3:21:38 PM Version By: Ruthine Dose Entered By: Montey Hora on 04/26/2015 16:33:15 Michelle [name] ([id number]) -------------------------------------------------------------------------------- Lower Extremity Assessment Details Patient Name: Michelle [name] Date of Service: 04/26/2015 2:30 PM Medical Record Number: [id number] Patient Account Number: [id number] Date of Birth/Sex: May 21, 1932 (81 y.o. Female) Treating RN: Montey Hora Primary Care Physician: Reed Breech Other Clinician: Referring Physician: Reed Breech Treating Physician/Extender: Frann Rider in Treatment: 5 Edema Assessment Assessed: [Left: No] [Right: No] E[Left: dema] [Right: :] Calf Left: Right: Point of Measurement: 36 cm From Medial Instep cm 37.9 cm Ankle Left: Right: Point of Measurement: 8 cm From Medial Instep cm 21.9 cm Vascular Assessment Pulses: Posterior Tibial Palpable: [Right:Yes] Dorsalis Pedis Palpable: [Right:Yes] Extremity colors, hair growth, and conditions: Extremity Color: [Right:Hyperpigmented] Hair Growth on Extremity: [Right:No] Temperature of Extremity: [Right:Warm] Capillary Refill: [Right:< 3 seconds] Electronic Signature(s) Signed: 04/26/2015 5:00:03 PM By: Montey Hora Entered By: Montey Hora on 04/26/2015 15:05:05 Michelle [name] ([id number]) -------------------------------------------------------------------------------- Multi Wound Chart Details Patient Name: Michelle [name] Date of Service: 04/26/2015  2:30 PM Medical Record Number: [id number] Patient Account Number: [id number] Date of Birth/Sex: September 05, 1932 (81 y.o. Female) Treating RN: Montey Hora Primary Care Physician: Reed Breech Other Clinician: Referring Physician: Reed Breech Treating Physician/Extender: Frann Rider in Treatment: 5 Vital Signs Height(in): 62 Pulse(bpm): 52 Weight(lbs): 140 Blood Pressure 180/59 (mmHg): Body Mass Index(BMI): 26 Temperature(F): 97.2 Respiratory Rate 18 (breaths/min): Photos: [1:No Photos] [N/A:N/A] Wound Location: [1:Right Lower Leg] [N/A:N/A] Wounding Event: [1:Trauma] [N/A:N/A] Primary  Etiology: [1:Trauma, Other] [N/A:N/A] Comorbid History: [1:Chronic Obstructive Pulmonary Disease (COPD), Arrhythmia, Hypertension, Type II Diabetes] [N/A:N/A] Date Acquired: [1:03/05/2015] [N/A:N/A] Weeks of Treatment: [1:5] [N/A:N/A] Wound Status: [1:Open] [N/A:N/A] Measurements L x W x D 1.1x1.4x0.1 [N/A:N/A] (cm) Area (cm) : [1:1.21] [N/A:N/A] Volume (cm) : [1:0.121] [N/A:N/A] % Reduction in Area: [1:64.30%] [N/A:N/A] % Reduction in Volume: 96.80% [N/A:N/A] Classification: [1:Full Thickness Without Exposed Support Structures] [N/A:N/A] HBO Classification: [1:Grade 2] [N/A:N/A] Exudate Amount: [1:Medium] [N/A:N/A] Exudate Type: [1:Serosanguineous] [N/A:N/A] Exudate Color: [1:red, brown] [N/A:N/A] Wound Margin: [1:Distinct, outline attached] [N/A:N/A] Granulation Amount: [1:Large (67-100%)] [N/A:N/A] Granulation Quality: [1:Red] [N/A:N/A] Necrotic Amount: [1:Small (1-33%)] [N/A:N/A] Exposed Structures: [N/A:N/A] Fascia: No Fat: No Tendon: No Muscle: No Joint: No Bone: No Limited to Skin Breakdown Epithelialization: None N/A N/A Periwound Skin Texture: Edema: Yes N/A N/A Excoriation: No Induration: No Callus: No Crepitus: No Fluctuance: No Friable: No Rash: No Scarring: No Periwound Skin Moist: Yes N/A N/A Moisture: Maceration: No Dry/Scaly:  No Periwound Skin Color: Erythema: Yes N/A N/A Atrophie Blanche: No Cyanosis: No Ecchymosis: No Hemosiderin Staining: No Mottled: No Pallor: No Rubor: No Erythema Location: Circumferential N/A N/A Temperature: No Abnormality N/A N/A Tenderness on No N/A N/A Palpation: Wound Preparation: Ulcer Cleansing: N/A N/A Rinsed/Irrigated with Saline Topical Anesthetic Applied: Other: lidocaine 4% Treatment Notes Electronic Signature(s) Signed: 04/26/2015 5:00:03 PM By: Montey Hora Entered By: Montey Hora on 04/26/2015 15:05:45 Michelle [name] ([id number]) -------------------------------------------------------------------------------- [place] Details Patient Name: Michelle [name] Date of Service: 04/26/2015 2:30 PM Medical Record Number: [id number] Patient Account Number: [id number] Date of Birth/Sex: 07-11-1932 (81 y.o. Female) Treating RN: Montey Hora Primary Care Physician: Reed Breech Other Clinician: Referring Physician: Reed Breech Treating Physician/Extender: Frann Rider in Treatment: 5 Active Inactive Abuse / Safety / Falls / Self Care Management Nursing Diagnoses: Impaired physical mobility Potential for falls Goals: Patient will remain injury free Date Initiated: 03/20/2015 Goal Status: Active Patient/caregiver will verbalize understanding of skin care regimen Date Initiated: 03/20/2015 Goal Status: Active Patient/caregiver will verbalize/demonstrate measures taken to prevent injury and/or falls Date Initiated: 03/20/2015 Goal Status: Active Patient/caregiver will verbalize/demonstrate understanding of what to do in case of emergency Date Initiated: 03/20/2015 Goal Status: Active Interventions: Assess fall risk on admission and as needed Provide education on fall prevention Provide education on safe transfers Notes: Nutrition Nursing Diagnoses: Potential for alteratiion in Nutrition/Potential for imbalanced  nutrition Goals: Patient/caregiver verbalizes understanding of need to maintain therapeutic glucose control per primary care physician Date Initiated: 03/20/2015 LANISSA, CASHEN ([id number]) Goal Status: Active Patient/caregiver will maintain therapeutic glucose control Date Initiated: 03/20/2015 Goal Status: Active Interventions: Assess HgA1c results as ordered upon admission and as needed Provide education on elevated blood sugars and impact on wound healing Provide education on nutrition Treatment Activities: Obtain HgA1c : 04/26/2015 Notes: Orientation to the Wound Care Program Nursing Diagnoses: Knowledge deficit related to the wound healing center program Goals: Patient/caregiver will verbalize understanding of the [place] Date Initiated: 03/20/2015 Goal Status: Active Interventions: Provide education on orientation to the wound center Notes: Wound/Skin Impairment Nursing Diagnoses: Impaired tissue integrity Knowledge deficit related to ulceration/compromised skin integrity Goals: Patient/caregiver will verbalize understanding of skin care regimen Date Initiated: 03/20/2015 Goal Status: Active Ulcer/skin breakdown will heal within 14 weeks Date Initiated: 03/20/2015 Goal Status: Active Interventions: Assess patient/caregiver ability to obtain necessary supplies Assess patient/caregiver ability to perform ulcer/skin care regimen upon admission and as needed Grainger, Alexandra C. ([id number]) Assess ulceration(s) every visit Provide education on ulcer and skin care  Notes: Electronic Signature(s) Signed: 04/26/2015 5:00:03 PM By: Montey Hora Entered By: Montey Hora on 04/26/2015 15:05:39 Michelle [name] ([id number]) -------------------------------------------------------------------------------- Patient/Caregiver Education Details Patient Name: Michelle [name] Date of Service: 04/26/2015 2:30 PM Medical Record Number: [id number] Patient Account  Number: [id number] Date of Birth/Gender: 07-Nov-1932 (81 y.o. Female) Treating RN: Montey Hora Primary Care Physician: Reed Breech Other Clinician: Referring Physician: Reed Breech Treating Physician/Extender: Frann Rider in Treatment: 5 Education Assessment Education Provided To: Patient Education Topics Provided Wound/Skin Impairment: Handouts: Other: wound care as ordered Methods: Demonstration, Explain/Verbal Responses: State content correctly Electronic Signature(s) Signed: 04/26/2015 4:33:37 PM By: Montey Hora Entered By: Montey Hora on 04/26/2015 16:33:37 Michelle [name] ([id number]) -------------------------------------------------------------------------------- Wound Assessment Details Patient Name: Michelle [name] Date of Service: 04/26/2015 2:30 PM Medical Record Number: [id number] Patient Account Number: [id number] Date of Birth/Sex: 19-Dec-1931 (81 y.o. Female) Treating RN: Montey Hora Primary Care Physician: Reed Breech Other Clinician: Referring Physician: Reed Breech Treating Physician/Extender: Frann Rider in Treatment: 5 Wound Status Wound Number: 1 Primary Trauma, Other Etiology: Wound Location: Right Lower Leg Wound Open Wounding Event: Trauma Status: Date Acquired: 03/05/2015 Comorbid Chronic Obstructive Pulmonary Weeks Of Treatment: 5 History: Disease (COPD), Arrhythmia, Clustered Wound: No Hypertension, Type II Diabetes Photos Photo Uploaded By: Montey Hora on 04/26/2015 16:30:13 Wound Measurements Length: (cm) 1.1 Width: (cm) 1.4 Depth: (cm) 0.1 Area: (cm) 1.21 Volume: (cm) 0.121 % Reduction in Area: 64.3% % Reduction in Volume: 96.8% Epithelialization: None Tunneling: No Undermining: No Wound Description Full Thickness Without Exposed Foul Odor Af Classification: Support Structures Diabetic Severity Grade 2 ([place]): Wound Margin: Distinct, outline  attached Exudate Amount: Medium Exudate Type: Serosanguineous Exudate Color: red, brown ter Cleansing: No Wound Bed Granulation Amount: Large (67-100%) Exposed Structure Cancro, Shaquitta C. ([id number]) Granulation Quality: Red Fascia Exposed: No Necrotic Amount: Small (1-33%) Fat Layer Exposed: No Necrotic Quality: Adherent Slough Tendon Exposed: No Muscle Exposed: No Joint Exposed: No Bone Exposed: No Limited to Skin Breakdown Periwound Skin Texture Texture Color No Abnormalities Noted: No No Abnormalities Noted: No Callus: No Atrophie Blanche: No Crepitus: No Cyanosis: No Excoriation: No Ecchymosis: No Fluctuance: No Erythema: Yes Friable: No Erythema Location: Circumferential Induration: No Hemosiderin Staining: No Localized Edema: Yes Mottled: No Rash: No Pallor: No Scarring: No Rubor: No Moisture Temperature / Pain No Abnormalities Noted: No Temperature: No Abnormality Dry / Scaly: No Maceration: No Moist: Yes Wound Preparation Ulcer Cleansing: Rinsed/Irrigated with Saline Topical Anesthetic Applied: Other: lidocaine 4%, Treatment Notes Wound #1 (Right Lower Leg) 1. Cleansed with: Clean wound with Normal Saline 2. Anesthetic Topical Lidocaine 4% cream to wound bed prior to debridement 3. Peri-wound Care: Skin Prep 4. Dressing Applied: Aquacel Ag 5. Secondary Dressing Applied Bordered Foam Dressing Electronic Signature(s) Signed: 04/26/2015 5:00:03 PM By: Montey Hora Entered By: Montey Hora on 04/26/2015 15:04:04 Michelle [name] ([id number]) Michelle [name] ([id number]) -------------------------------------------------------------------------------- [place] Details Patient Name: Michelle [name] Date of Service: 04/26/2015 2:30 PM Medical Record Number: [id number] Patient Account Number: [id number] Date of Birth/Sex: 10/08/1932 (81 y.o. Female) Treating RN: Montey Hora Primary Care Physician: Reed Breech Other  Clinician: Referring Physician: Reed Breech Treating Physician/Extender: Frann Rider in Treatment: 5 Vital Signs Time Taken: 15:02 Temperature (F): 97.2 Height (in): 62 Pulse (bpm): 52 Weight (lbs): 140 Respiratory Rate (breaths/min): 18 Body Mass Index (BMI): 25.6 Blood Pressure (mmHg): 180/59 Reference Range: 80 - 120 mg / dl Electronic Signature(s) Signed: 04/26/2015 5:00:03 PM By: Montey Hora Entered By: Marjory Lies,  Joanna on 04/26/2015 15:02:54

## 2015-04-27 NOTE — Progress Notes (Signed)
[name], [name] ([id number]) Visit Report for 04/26/2015 Chief Complaint Document Details Patient Name: Michelle [name], Michelle [name]. Date of Service: 04/26/2015 2:30 PM Medical Record Number: [id number] Patient Account Number: [id number] Date of Birth/Sex: 24-Oct-1932 (81 y.o. Female) Treating RN: Primary Care Physician: Reed Breech Other Clinician: Referring Physician: Reed Breech Treating Physician/Extender: Frann Rider in Treatment: 5 Information Obtained from: Patient Chief Complaint Patient presents to the wound care center for a consult due non healing wound. Pleasant 81 year old, comes with a injury to her right lower extremity which has been there for about 2 weeks. Electronic Signature(s) Signed: 04/26/2015 4:07:07 PM By: Christin Fudge MD, FACS Entered By: Christin Fudge on 04/26/2015 15:18:13 Michelle [name] ([id number]) -------------------------------------------------------------------------------- HPI Details Patient Name: Michelle [name] Date of Service: 04/26/2015 2:30 PM Medical Record Number: [id number] Patient Account Number: [id number] Date of Birth/Sex: 02-28-1932 (81 y.o. Female) Treating RN: Primary Care Physician: Reed Breech Other Clinician: Referring Physician: Reed Breech Treating Physician/Extender: Frann Rider in Treatment: 5 History of Present Illness Location: right lower extremity Quality: Patient reports experiencing a dull pain to affected area(s). Severity: Patient states wound are getting worse. Duration: Patient has had the wound for < 2 weeks prior to presenting for treatment Timing: Pain in wound is Intermittent (comes and goes Context: The wound occurred when the patient had her door slammed into her right leg while she was at home about 2 weeks ago. Modifying Factors: Consults to this date include: application of hydrogen peroxide and Neosporin. Associated Signs and Symptoms: Patient reports having  difficulty standing for long periods. HPI Description: this pleasant 81 year old who has been known to be a diabetic for the last 30 years has been injured with a door slamming into her leg about 2 weeks ago. The patient is a poor historian and we could not get many records from her family practice doctor's office. She has been new using hydrogen peroxide and Neosporin on the wound and says that she squeezed out the scab and some pus. other comorbidities include high blood pressure and hypothyroidism. She says she checks her blood sugar everyday and it has been running fairly good and her last hemoglobin A1c done in November 2015 was 7.4. She has had no x-rays of her leg done nor she had any significant problems in the recent past. 04/05/2015 - No new complaints. No significant pain. No fever or chills. Improved drainage. Ambulating per her baseline with a cane. Electronic Signature(s) Signed: 04/26/2015 4:07:07 PM By: Christin Fudge MD, FACS Entered By: Christin Fudge on 04/26/2015 15:18:21 Michelle [name] ([id number]) -------------------------------------------------------------------------------- Physical Exam Details Patient Name: Michelle [name] Date of Service: 04/26/2015 2:30 PM Medical Record Number: [id number] Patient Account Number: [id number] Date of Birth/Sex: 1932/09/26 (81 y.o. Female) Treating RN: Primary Care Physician: Reed Breech Other Clinician: Referring Physician: Reed Breech Treating Physician/Extender: Frann Rider in Treatment: 5 Constitutional . Pulse regular. Respirations normal and unlabored. Afebrile. . Eyes Nonicteric. Reactive to light. Ears, Nose, Mouth, and Throat Lips, teeth, and gums WNL.Marland Kitchen Moist mucosa without lesions . Neck supple and nontender. No palpable supraclavicular or cervical adenopathy. Normal sized without goiter. Respiratory WNL. No retractions.. Cardiovascular Pedal Pulses WNL. No clubbing, cyanosis or  edema. Musculoskeletal Adexa without tenderness or enlargement.. Digits and nails w/o clubbing, cyanosis, infection, petechiae, ischemia, or inflammatory conditions.. Integumentary (Hair, Skin) the wound looks very good and is filled out nicely with granulation tissue and is flush with the skin now.. No crepitus or fluctuance. No  peri-wound warmth or erythema. No masses.Marland Kitchen Psychiatric Judgement and insight Intact.. No evidence of depression, anxiety, or agitation.. Electronic Signature(s) Signed: 04/26/2015 4:07:07 PM By: Christin Fudge MD, FACS Entered By: Christin Fudge on 04/26/2015 15:18:55 Michelle [name] ([id number]) -------------------------------------------------------------------------------- Physician Orders Details Patient Name: Michelle [name] Date of Service: 04/26/2015 2:30 PM Medical Record Number: [id number] Patient Account Number: [id number] Date of Birth/Sex: [birth year] (81 y.o. Female) Treating RN: Montey Hora Primary Care Physician: Reed Breech Other Clinician: Referring Physician: Reed Breech Treating Physician/Extender: Frann Rider in Treatment: 5 Verbal / Phone Orders: Yes Clinician: Montey Hora Read Back and Verified: Yes Diagnosis Coding ICD-10 Coding Code Description E11.622 Type 2 diabetes mellitus with other skin ulcer L97.812 Non-pressure chronic ulcer of other part of right lower leg with fat layer exposed S81.811A Laceration without foreign body, right lower leg, initial encounter Wound Cleansing Wound #1 Right Lower Leg o Clean wound with Normal Saline. Anesthetic Wound #1 Right Lower Leg o Topical Lidocaine 4% cream applied to wound bed prior to debridement Skin Barriers/Peri-Wound Care Wound #1 Right Lower Leg o Skin Prep Primary Wound Dressing Wound #1 Right Lower Leg o Aquacel Ag Secondary Dressing Wound #1 Right Lower Leg o Boardered Foam Dressing Dressing Change Frequency Wound #1 Right Lower  Leg o Change dressing every other day. Follow-up Appointments Wound #1 Right Lower Leg o Return Appointment in 1 week. Michelle [name], Michelle [name] ([id number]) Edema Control Wound #1 Right Lower Leg o Tubigrip Electronic Signature(s) Signed: 04/26/2015 4:07:07 PM By: Christin Fudge MD, FACS Signed: 04/26/2015 5:00:03 PM By: Montey Hora Entered By: Montey Hora on 04/26/2015 15:19:21 Michelle [name] ([id number]) -------------------------------------------------------------------------------- Problem List Details Patient Name: Michelle [name]. Date of Service: 04/26/2015 2:30 PM Medical Record Number: [id number] Patient Account Number: [id number] Date of Birth/Sex: 12/20/1931 (81 y.o. Female) Treating RN: Primary Care Physician: Reed Breech Other Clinician: Referring Physician: Reed Breech Treating Physician/Extender: Frann Rider in Treatment: 5 Active Problems ICD-10 Encounter Code Description Active Date Diagnosis E11.622 Type 2 diabetes mellitus with other skin ulcer 03/20/2015 Yes L97.812 Non-pressure chronic ulcer of other part of right lower leg 03/20/2015 Yes with fat layer exposed S81.811A Laceration without foreign body, right lower leg, initial 03/20/2015 Yes encounter Inactive Problems Resolved Problems Electronic Signature(s) Signed: 04/26/2015 4:07:07 PM By: Christin Fudge MD, FACS Entered By: Christin Fudge on 04/26/2015 15:18:00 Michelle [name] ([id number]) -------------------------------------------------------------------------------- Progress Note Details Patient Name: Michelle [name] Date of Service: 04/26/2015 2:30 PM Medical Record Number: [id number] Patient Account Number: [id number] Date of Birth/Sex: [birth year] (81 y.o. Female) Treating RN: Primary Care Physician: Reed Breech Other Clinician: Referring Physician: Reed Breech Treating Physician/Extender: Frann Rider in Treatment: 5 Subjective Chief  Complaint Information obtained from Patient Patient presents to the wound care center for a consult due non healing wound. Pleasant 81 year old, comes with a injury to her right lower extremity which has been there for about 2 weeks. History of Present Illness (HPI) The following HPI elements were documented for the patient's wound: Location: right lower extremity Quality: Patient reports experiencing a dull pain to affected area(s). Severity: Patient states wound are getting worse. Duration: Patient has had the wound for < 2 weeks prior to presenting for treatment Timing: Pain in wound is Intermittent (comes and goes Context: The wound occurred when the patient had her door slammed into her right leg while she was at home about 2 weeks ago. Modifying Factors: Consults to this date include: application of hydrogen peroxide and  Neosporin. Associated Signs and Symptoms: Patient reports having difficulty standing for long periods. this pleasant 81 year old who has been known to be a diabetic for the last 30 years has been injured with a door slamming into her leg about 2 weeks ago. The patient is a poor historian and we could not get many records from her family practice doctor's office. She has been new using hydrogen peroxide and Neosporin on the wound and says that she squeezed out the scab and some pus. other comorbidities include high blood pressure and hypothyroidism. She says she checks her blood sugar everyday and it has been running fairly good and her last hemoglobin A1c done in November 2015 was 7.4. She has had no x-rays of her leg done nor she had any significant problems in the recent past. 04/05/2015 - No new complaints. No significant pain. No fever or chills. Improved drainage. Ambulating per her baseline with a cane. Objective Constitutional Pulse regular. Respirations normal and unlabored. Afebrile. Michelle [name], Michelle [name] ([id number]) Vitals Time Taken: 3:02 PM, Height: 62 in,  Weight: 140 lbs, BMI: 25.6, Temperature: 97.2 F, Pulse: 52 bpm, Respiratory Rate: 18 breaths/min, Blood Pressure: 180/59 mmHg. Eyes Nonicteric. Reactive to light. Ears, Nose, Mouth, and Throat Lips, teeth, and gums WNL.Marland Kitchen Moist mucosa without lesions . Neck supple and nontender. No palpable supraclavicular or cervical adenopathy. Normal sized without goiter. Respiratory WNL. No retractions.. Cardiovascular Pedal Pulses WNL. No clubbing, cyanosis or edema. Musculoskeletal Adexa without tenderness or enlargement.. Digits and nails w/o clubbing, cyanosis, infection, petechiae, ischemia, or inflammatory conditions.Marland Kitchen Psychiatric Judgement and insight Intact.. No evidence of depression, anxiety, or agitation.. Integumentary (Hair, Skin) the wound looks very good and is filled out nicely with granulation tissue and is flush with the skin now.. No crepitus or fluctuance. No peri-wound warmth or erythema. No masses.. Wound #1 status is Open. Original cause of wound was Trauma. The wound is located on the Right Lower Leg. The wound measures 1.1cm length x 1.4cm width x 0.1cm depth; 1.21cm^2 area and 0.121cm^3 volume. The wound is limited to skin breakdown. There is no tunneling or undermining noted. There is a medium amount of serosanguineous drainage noted. The wound margin is distinct with the outline attached to the wound base. There is large (67-100%) red granulation within the wound bed. There is a small (1-33%) amount of necrotic tissue within the wound bed including Adherent Slough. The periwound skin appearance exhibited: Localized Edema, Moist, Erythema. The periwound skin appearance did not exhibit: Callus, Crepitus, Excoriation, Fluctuance, Friable, Induration, Rash, Scarring, Dry/Scaly, Maceration, Atrophie Blanche, Cyanosis, Ecchymosis, Hemosiderin Staining, Mottled, Pallor, Rubor. The surrounding wound skin color is noted with erythema which is circumferential. Periwound temperature  was noted as No Abnormality. Assessment Michelle [name], Michelle [name] ([id number]) Active Problems ICD-10 E11.622 - Type 2 diabetes mellitus with other skin ulcer L97.812 - Non-pressure chronic ulcer of other part of right lower leg with fat layer exposed S81.811A - Laceration without foreign body, right lower leg, initial encounter We will continue to use silver alginate and put a bordered foam dressing over this. She is asked to come see me next week but she says due to transport problems she will probably try and make it in 2 weeks. Plan We will continue to use silver alginate and put a bordered foam dressing over this. She is asked to come see me next week but she says due to transport problems she will probably try and make it in 2 weeks. Electronic Signature(s) Signed: 04/26/2015 4:07:07 PM By: Con Memos,  Sparkle Aube MD, FACS Entered By: Christin Fudge on 04/26/2015 15:19:42 Michelle [name] ([id number]) -------------------------------------------------------------------------------- SuperBill Details Patient Name: Michelle [name] Date of Service: 04/26/2015 Medical Record Number: [id number] Patient Account Number: [id number] Date of Birth/Sex: 20-Aug-1932 (81 y.o. Female) Treating RN: Primary Care Physician: Reed Breech Other Clinician: Referring Physician: Reed Breech Treating Physician/Extender: Frann Rider in Treatment: 5 Diagnosis Coding ICD-10 Codes Code Description E11.622 Type 2 diabetes mellitus with other skin ulcer L97.812 Non-pressure chronic ulcer of other part of right lower leg with fat layer exposed S81.811A Laceration without foreign body, right lower leg, initial encounter Facility Procedures CPT4 Code: [id number] Description: 99213 - WOUND CARE VISIT-LEV 3 EST PT Modifier: Quantity: 1 Physician Procedures CPT4: Description Modifier Quantity Code [id number] - WC PHYS LEVEL 3 - EST PT 1 ICD-10 Description Diagnosis E11.622 Type 2 diabetes mellitus with  other skin ulcer L97.812 Non-pressure chronic ulcer of other part of right lower leg with fat layer  exposed S81.811A Laceration without foreign body, right lower leg, initial encounter Electronic Signature(s) Signed: 04/26/2015 4:32:13 PM By: Montey Hora Previous Signature: 04/26/2015 4:07:07 PM Version By: Christin Fudge MD, FACS Entered By: Montey Hora on 04/26/2015 16:32:13

## 2015-05-04 DIAGNOSIS — R7309 Other abnormal glucose: Secondary | ICD-10-CM | POA: Diagnosis not present

## 2015-05-04 DIAGNOSIS — E161 Other hypoglycemia: Secondary | ICD-10-CM | POA: Diagnosis not present

## 2015-05-10 ENCOUNTER — Encounter: Payer: Medicare Other | Admitting: Surgery

## 2015-05-10 DIAGNOSIS — E11622 Type 2 diabetes mellitus with other skin ulcer: Secondary | ICD-10-CM | POA: Diagnosis not present

## 2015-05-10 DIAGNOSIS — L97812 Non-pressure chronic ulcer of other part of right lower leg with fat layer exposed: Secondary | ICD-10-CM | POA: Diagnosis not present

## 2015-05-10 DIAGNOSIS — S81811D Laceration without foreign body, right lower leg, subsequent encounter: Secondary | ICD-10-CM | POA: Diagnosis not present

## 2015-05-10 DIAGNOSIS — S81811A Laceration without foreign body, right lower leg, initial encounter: Secondary | ICD-10-CM | POA: Diagnosis not present

## 2015-05-11 NOTE — Progress Notes (Signed)
Michelle, [name] ([id number]) Visit Report for 05/10/2015 Arrival Information Details Patient Name: Michelle [name], Michelle [name]. Date of Service: 05/10/2015 2:30 PM Medical Record Number: [id number] Patient Account Number: [id number] Date of Birth/Sex: [birth year] (81 y.o. Female) Treating RN: Cornell Barman Primary Care Physician: Reed Breech Other Clinician: Referring Physician: Reed Breech Treating Physician/Extender: Frann Rider in Treatment: 7 Visit Information History Since Last Visit Added or deleted any medications: No Patient Arrived: Ambulatory Any new allergies or adverse reactions: No Arrival Time: 15:00 Had a fall or experienced change in No Accompanied By: self activities of daily living that may affect Transfer Assistance: None risk of falls: Patient Identification Verified: Yes Signs or symptoms of abuse/neglect since last No Secondary Verification Process Yes visito Completed: Hospitalized since last visit: No Patient Has Alerts: Yes Has Dressing in Place as Prescribed: Yes Patient Alerts: DMII Pain Present Now: No ABI >220 [place] Electronic Signature(s) Signed: 05/11/2015 4:20:29 PM By: Gretta Cool, RN, BSN, Kim RN, BSN Entered By: Gretta Cool, RN, BSN, Kim on 05/10/2015 15:01:00 Michelle [name] ([id number]) -------------------------------------------------------------------------------- Clinic Level of Care Assessment Details Patient Name: Michelle [name]. Date of Service: 05/10/2015 2:30 PM Medical Record Number: [id number] Patient Account Number: [id number] Date of Birth/Sex: [birth year] (81 y.o. Female) Treating RN: Cornell Barman Primary Care Physician: Reed Breech Other Clinician: Referring Physician: Reed Breech Treating Physician/Extender: Frann Rider in Treatment: 7 Clinic Level of Care Assessment Items TOOL 4 Quantity Score []  - Use when only an EandM is performed on FOLLOW-UP visit 0 ASSESSMENTS - Nursing Assessment /  Reassessment []  - Reassessment of Co-morbidities (includes updates in patient status) 0 X - Reassessment of Adherence to Treatment Plan 1 5 ASSESSMENTS - Wound and Skin Assessment / Reassessment X - Simple Wound Assessment / Reassessment - one wound 1 5 []  - Complex Wound Assessment / Reassessment - multiple wounds 0 []  - Dermatologic / Skin Assessment (not related to wound area) 0 ASSESSMENTS - Focused Assessment []  - Circumferential Edema Measurements - multi extremities 0 []  - Nutritional Assessment / Counseling / Intervention 0 []  - Lower Extremity Assessment (monofilament, tuning fork, pulses) 0 []  - Peripheral Arterial Disease Assessment (using hand held doppler) 0 ASSESSMENTS - Ostomy and/or Continence Assessment and Care []  - Incontinence Assessment and Management 0 []  - Ostomy Care Assessment and Management (repouching, etc.) 0 PROCESS - Coordination of Care []  - Simple Patient / Family Education for ongoing care 0 []  - Complex (extensive) Patient / Family Education for ongoing care 0 []  - Staff obtains Programmer, systems, Records, Test Results / Process Orders 0 []  - Staff telephones HHA, Nursing Homes / Clarify orders / etc 0 []  - Routine Transfer to another Facility (non-emergent condition) 0 Debski, Mabel C. ([id number]) []  - Routine Hospital Admission (non-emergent condition) 0 []  - New Admissions / Biomedical engineer / Ordering NPWT, Apligraf, etc. 0 []  - Emergency Hospital Admission (emergent condition) 0 []  - Simple Discharge Coordination 0 []  - Complex (extensive) Discharge Coordination 0 PROCESS - Special Needs []  - Pediatric / Minor Patient Management 0 []  - Isolation Patient Management 0 []  - Hearing / Language / Visual special needs 0 []  - Assessment of Community assistance (transportation, D/C planning, etc.) 0 []  - Additional assistance / Altered mentation 0 []  - Support Surface(s) Assessment (bed, cushion, seat, etc.) 0 INTERVENTIONS - Wound Cleansing /  Measurement X - Simple Wound Cleansing - one wound 1 5 []  - Complex Wound Cleansing - multiple wounds 0 []  - Wound Imaging (photographs - any  number of wounds) 0 []  - Wound Tracing (instead of photographs) 0 []  - Simple Wound Measurement - one wound 0 []  - Complex Wound Measurement - multiple wounds 0 INTERVENTIONS - Wound Dressings []  - Small Wound Dressing one or multiple wounds 0 []  - Medium Wound Dressing one or multiple wounds 0 []  - Large Wound Dressing one or multiple wounds 0 []  - Application of Medications - topical 0 []  - Application of Medications - injection 0 INTERVENTIONS - Miscellaneous []  - External ear exam 0 Pinsky, Lexxus C. ([id number]) []  - Specimen Collection (cultures, biopsies, blood, body fluids, etc.) 0 []  - Specimen(s) / Culture(s) sent or taken to Lab for analysis 0 []  - Patient Transfer (multiple staff / Harrel Lemon Lift / Similar devices) 0 []  - Simple Staple / Suture removal (25 or less) 0 []  - Complex Staple / Suture removal (26 or more) 0 []  - Hypo / Hyperglycemic Management (close monitor of Blood Glucose) 0 []  - Ankle / Brachial Index (ABI) - do not check if billed separately 0 X - Vital Signs 1 5 Has the patient been seen at the hospital within the last three years: Yes Total Score: 20 Level Of Care: New/Established - Level 1 Electronic Signature(s) Signed: 05/11/2015 4:20:29 PM By: Gretta Cool, RN, BSN, Kim RN, BSN Entered By: Gretta Cool, RN, BSN, Kim on 05/10/2015 15:19:08 Michelle [name] ([id number]) -------------------------------------------------------------------------------- Encounter Discharge Information Details Patient Name: Michelle [name] Date of Service: 05/10/2015 2:30 PM Medical Record Number: [id number] Patient Account Number: [id number] Date of Birth/Sex: 02-Apr-1932 (81 y.o. Female) Treating RN: Cornell Barman Primary Care Physician: Reed Breech Other Clinician: Referring Physician: Reed Breech Treating Physician/Extender: Frann Rider in Treatment: 7 Encounter Discharge Information Items Discharge Pain Level: 0 Discharge Condition: Stable Ambulatory Status: Cane Discharge Destination: Home Private Transportation: Auto Accompanied By: self Schedule Follow-up Appointment: Yes Medication Reconciliation completed and Yes provided to Patient/Care Jaymen Fetch: Clinical Summary of Care: Electronic Signature(s) Signed: 05/11/2015 4:20:29 PM By: Gretta Cool, RN, BSN, Kim RN, BSN Entered By: Gretta Cool, RN, BSN, Kim on 05/10/2015 15:20:09 Michelle [name] ([id number]) -------------------------------------------------------------------------------- Lower Extremity Assessment Details Patient Name: Michelle [name] Date of Service: 05/10/2015 2:30 PM Medical Record Number: [id number] Patient Account Number: [id number] Date of Birth/Sex: April 30, 1932 (81 y.o. Female) Treating RN: Cornell Barman Primary Care Physician: Reed Breech Other Clinician: Referring Physician: Reed Breech Treating Physician/Extender: Frann Rider in Treatment: 7 Edema Assessment Assessed: [Left: No] [Right: No] E[Left: dema] [Right: :] Calf Left: Right: Point of Measurement: cm From Medial Instep cm 37 cm Ankle Left: Right: Point of Measurement: cm From Medial Instep cm 22.5 cm Vascular Assessment Pulses: Posterior Tibial Palpable: [Right:Yes] Dorsalis Pedis Palpable: [Right:Yes] Extremity colors, hair growth, and conditions: Extremity Color: [Right:Hyperpigmented] Hair Growth on Extremity: [Right:Yes] Temperature of Extremity: [Right:Warm] Capillary Refill: [Right:< 3 seconds] Toe Nail Assessment Left: Right: Thick: No Discolored: No Deformed: No Improper Length and Hygiene: No Electronic Signature(s) Signed: 05/11/2015 4:20:29 PM By: Gretta Cool, RN, BSN, Kim RN, BSN Entered By: Gretta Cool, RN, BSN, Kim on 05/10/2015 15:04:31 Michelle [name] ([id number]) Kyung Bacca, Elmon Else  ([id number]) -------------------------------------------------------------------------------- Multi Wound Chart Details Patient Name: Michelle [name] Date of Service: 05/10/2015 2:30 PM Medical Record Number: [id number] Patient Account Number: [id number] Date of Birth/Sex: 1931-12-30 (81 y.o. Female) Treating RN: Cornell Barman Primary Care Physician: Reed Breech Other Clinician: Referring Physician: Reed Breech Treating Physician/Extender: Frann Rider in Treatment: 7 Vital Signs Height(in): 62 Pulse(bpm): 64 Weight(lbs): 140 Blood Pressure 115/86 (mmHg):  Body Mass Index(BMI): 26 Temperature(F): 98.1 Respiratory Rate 18 (breaths/min): Photos: [1:No Photos] [N/A:N/A] Wound Location: [1:Right Lower Leg] [N/A:N/A] Wounding Event: [1:Trauma] [N/A:N/A] Primary Etiology: [1:Trauma, Other] [N/A:N/A] Comorbid History: [1:Chronic Obstructive Pulmonary Disease (COPD), Arrhythmia, Hypertension, Type II Diabetes] [N/A:N/A] Date Acquired: [1:03/05/2015] [N/A:N/A] Weeks of Treatment: [1:7] [N/A:N/A] Wound Status: [1:Open] [N/A:N/A] Measurements L x W x D 0.5x0.7x0.1 [N/A:N/A] (cm) Area (cm) : [1:0.275] [N/A:N/A] Volume (cm) : [1:0.027] [N/A:N/A] % Reduction in Area: [1:91.90%] [N/A:N/A] % Reduction in Volume: 99.30% [N/A:N/A] Classification: [1:Full Thickness Without Exposed Support Structures] [N/A:N/A] HBO Classification: [1:Grade 2] [N/A:N/A] Exudate Amount: [1:Medium] [N/A:N/A] Exudate Type: [1:Serosanguineous] [N/A:N/A] Exudate Color: [1:red, brown] [N/A:N/A] Wound Margin: [1:Distinct, outline attached] [N/A:N/A] Granulation Amount: [1:Large (67-100%)] [N/A:N/A] Granulation Quality: [1:Red] [N/A:N/A] Necrotic Amount: [1:Small (1-33%)] [N/A:N/A] Exposed Structures: [N/A:N/A] Fascia: No Fat: No Tendon: No Muscle: No Joint: No Bone: No Limited to Skin Breakdown Epithelialization: None N/A N/A Periwound Skin Texture: Edema: No N/A N/A Excoriation:  No Induration: No Callus: No Crepitus: No Fluctuance: No Friable: No Rash: No Scarring: No Periwound Skin Moist: Yes N/A N/A Moisture: Maceration: No Dry/Scaly: No Periwound Skin Color: Erythema: Yes N/A N/A Atrophie Blanche: No Cyanosis: No Ecchymosis: No Hemosiderin Staining: No Mottled: No Pallor: No Rubor: No Erythema Location: Circumferential N/A N/A Temperature: No Abnormality N/A N/A Tenderness on No N/A N/A Palpation: Wound Preparation: Ulcer Cleansing: N/A N/A Rinsed/Irrigated with Saline Topical Anesthetic Applied: Other: lidocaine 4% Treatment Notes Electronic Signature(s) Signed: 05/11/2015 4:20:29 PM By: Gretta Cool, RN, BSN, Kim RN, BSN Entered By: Gretta Cool, RN, BSN, Kim on 05/10/2015 15:06:57 Michelle [name] ([id number]) -------------------------------------------------------------------------------- [place] Details Patient Name: Michelle [name] Date of Service: 05/10/2015 2:30 PM Medical Record Number: [id number] Patient Account Number: [id number] Date of Birth/Sex: November 23, 1931 (82 y.o. Female) Treating RN: Cornell Barman Primary Care Physician: Reed Breech Other Clinician: Referring Physician: Reed Breech Treating Physician/Extender: Frann Rider in Treatment: 7 Active Inactive Electronic Signature(s) Signed: 05/11/2015 4:20:29 PM By: Gretta Cool, RN, BSN, Kim RN, BSN Entered By: Gretta Cool, RN, BSN, Kim on 05/10/2015 15:29:55 Michelle [name] ([id number]) -------------------------------------------------------------------------------- Pain Assessment Details Patient Name: Michelle [name] Date of Service: 05/10/2015 2:30 PM Medical Record Number: [id number] Patient Account Number: [id number] Date of Birth/Sex: August 16, 1932 (81 y.o. Female) Treating RN: Cornell Barman Primary Care Physician: Reed Breech Other Clinician: Referring Physician: Reed Breech Treating Physician/Extender: Frann Rider in Treatment:  7 Active Problems Location of Pain Severity and Description of Pain Patient Has Paino No Site Locations Pain Management and Medication Current Pain Management: Electronic Signature(s) Signed: 05/11/2015 4:20:29 PM By: Gretta Cool, RN, BSN, Kim RN, BSN Entered By: Gretta Cool, RN, BSN, Kim on 05/10/2015 15:01:07 Michelle [name] ([id number]) -------------------------------------------------------------------------------- Patient/Caregiver Education Details Patient Name: Michelle [name] Date of Service: 05/10/2015 2:30 PM Medical Record Number: [id number] Patient Account Number: [id number] Date of Birth/Gender: 05/11/1932 (81 y.o. Female) Treating RN: Cornell Barman Primary Care Physician: Reed Breech Other Clinician: Referring Physician: Reed Breech Treating Physician/Extender: Frann Rider in Treatment: 7 Education Assessment Education Provided To: Patient Education Topics Provided Wound/Skin Impairment: Handouts: Other: new skin is tender; be gentle, do not scrub, cover with bandaid for protection Electronic Signature(s) Signed: 05/11/2015 4:20:29 PM By: Gretta Cool, RN, BSN, Kim RN, BSN Entered By: Gretta Cool, RN, BSN, Kim on 05/10/2015 15:31:33 Michelle [name] ([id number]) -------------------------------------------------------------------------------- Wound Assessment Details Patient Name: Michelle [name] Date of Service: 05/10/2015 2:30 PM Medical Record Number: [id number] Patient Account Number: [id number] Date of Birth/Sex: [birth year] (81 y.o. Female) Treating  RN: Cornell Barman Primary Care Physician: Reed Breech Other Clinician: Referring Physician: Reed Breech Treating Physician/Extender: Frann Rider in Treatment: 7 Wound Status Wound Number: 1 Primary Trauma, Other Etiology: Wound Location: Right Lower Leg Wound Healed - Epithelialized Wounding Event: Trauma Status: Date Acquired: 03/05/2015 Comorbid Chronic Obstructive Pulmonary Weeks Of  Treatment: 7 History: Disease (COPD), Arrhythmia, Clustered Wound: No Hypertension, Type II Diabetes Photos Photo Uploaded By: Gretta Cool, RN, BSN, Kim on 05/10/2015 15:11:09 Wound Measurements Length: (cm) 0 % Reduction Width: (cm) 0 % Reduction Depth: (cm) 0 Epithelializ Area: (cm) 0 Volume: (cm) 0 in Area: 100% in Volume: 100% ation: None Wound Description Full Thickness Without Exposed Foul Odor A Classification: Support Structures Diabetic Severity Grade 2 ([place]): Wound Margin: Distinct, outline attached Exudate Amount: Medium Exudate Type: Serosanguineous Exudate Color: red, brown fter Cleansing: No Wound Bed Granulation Amount: Large (67-100%) Exposed Structure Adee, Nyema C. ([id number]) Granulation Quality: Red Fascia Exposed: No Necrotic Amount: Small (1-33%) Fat Layer Exposed: No Necrotic Quality: Adherent Slough Tendon Exposed: No Muscle Exposed: No Joint Exposed: No Bone Exposed: No Limited to Skin Breakdown Periwound Skin Texture Texture Color No Abnormalities Noted: No No Abnormalities Noted: No Callus: No Atrophie Blanche: No Crepitus: No Cyanosis: No Excoriation: No Ecchymosis: No Fluctuance: No Erythema: Yes Friable: No Erythema Location: Circumferential Induration: No Hemosiderin Staining: No Localized Edema: No Mottled: No Rash: No Pallor: No Scarring: No Rubor: No Moisture Temperature / Pain No Abnormalities Noted: No Temperature: No Abnormality Dry / Scaly: No Maceration: No Moist: Yes Wound Preparation Ulcer Cleansing: Rinsed/Irrigated with Saline Topical Anesthetic Applied: Other: lidocaine 4%, Electronic Signature(s) Signed: 05/11/2015 4:20:29 PM By: Gretta Cool, RN, BSN, Kim RN, BSN Entered By: Gretta Cool, RN, BSN, Kim on 05/10/2015 15:19:28 Michelle [name] ([id number]) -------------------------------------------------------------------------------- Vitals Details Patient Name: Michelle [name] Date of Service: 05/10/2015  2:30 PM Medical Record Number: [id number] Patient Account Number: [id number] Date of Birth/Sex: 1932-06-13 (81 y.o. Female) Treating RN: Cornell Barman Primary Care Physician: Reed Breech Other Clinician: Referring Physician: Reed Breech Treating Physician/Extender: Frann Rider in Treatment: 7 Vital Signs Time Taken: 15:01 Temperature (F): 98.1 Height (in): 62 Pulse (bpm): 64 Weight (lbs): 140 Respiratory Rate (breaths/min): 18 Body Mass Index (BMI): 25.6 Blood Pressure (mmHg): 115/86 Reference Range: 80 - 120 mg / dl Electronic Signature(s) Signed: 05/11/2015 4:20:29 PM By: Gretta Cool, RN, BSN, Kim RN, BSN Entered By: Gretta Cool, RN, BSN, Kim on 05/10/2015 15:01:31

## 2015-05-11 NOTE — Progress Notes (Signed)
JAYMARIE, YEAKEL ([id number]) Visit Report for 05/10/2015 Chief Complaint Document Details Patient Name: Michelle [name], Michelle [name]. Date of Service: 05/10/2015 2:30 PM Medical Record Number: [id number] Patient Account Number: [id number] Date of Birth/Sex: September 15, 1932 (81 y.o. Female) Treating RN: Primary Care Physician: Reed Breech Other Clinician: Referring Physician: Reed Breech Treating Physician/Extender: Frann Rider in Treatment: 7 Information Obtained from: Patient Chief Complaint Patient presents to the wound care center for a consult due non healing wound. Pleasant 81 year old, comes with a injury to her right lower extremity which has been there for about 2 weeks. Electronic Signature(s) Signed: 05/10/2015 4:41:39 PM By: Christin Fudge MD, FACS Entered By: Christin Fudge on 05/10/2015 15:22:03 Michelle [name] ([id number]) -------------------------------------------------------------------------------- HPI Details Patient Name: Michelle [name] Date of Service: 05/10/2015 2:30 PM Medical Record Number: [id number] Patient Account Number: [id number] Date of Birth/Sex: [birth year] (81 y.o. Female) Treating RN: Primary Care Physician: Reed Breech Other Clinician: Referring Physician: Reed Breech Treating Physician/Extender: Frann Rider in Treatment: 7 History of Present Illness Location: right lower extremity Quality: Patient reports experiencing a dull pain to affected area(s). Severity: Patient states wound are getting worse. Duration: Patient has had the wound for < 2 weeks prior to presenting for treatment Timing: Pain in wound is Intermittent (comes and goes Context: The wound occurred when the patient had her door slammed into her right leg while she was at home about 2 weeks ago. Modifying Factors: Consults to this date include: application of hydrogen peroxide and Neosporin. Associated Signs and Symptoms: Patient reports having  difficulty standing for long periods. HPI Description: this pleasant 81 year old who has been known to be a diabetic for the last 30 years has been injured with a door slamming into her leg about 2 weeks ago. The patient is a poor historian and we could not get many records from her family practice doctor's office. She has been new using hydrogen peroxide and Neosporin on the wound and says that she squeezed out the scab and some pus. other comorbidities include high blood pressure and hypothyroidism. She says she checks her blood sugar everyday and it has been running fairly good and her last hemoglobin A1c done in November 2015 was 7.4. She has had no x-rays of her leg done nor she had any significant problems in the recent past. 04/05/2015 - No new complaints. No significant pain. No fever or chills. Improved drainage. Ambulating per her baseline with a cane. Electronic Signature(s) Signed: 05/10/2015 4:41:39 PM By: Christin Fudge MD, FACS Entered By: Christin Fudge on 05/10/2015 15:22:08 Michelle [name] ([id number]) -------------------------------------------------------------------------------- Physical Exam Details Patient Name: Michelle [name] Date of Service: 05/10/2015 2:30 PM Medical Record Number: [id number] Patient Account Number: [id number] Date of Birth/Sex: 10-Dec-1931 (81 y.o. Female) Treating RN: Primary Care Physician: Reed Breech Other Clinician: Referring Physician: Reed Breech Treating Physician/Extender: Frann Rider in Treatment: 7 Constitutional . Pulse regular. Respirations normal and unlabored. Afebrile. . Eyes Nonicteric. Reactive to light. Ears, Nose, Mouth, and Throat Lips, teeth, and gums WNL.Marland Kitchen Moist mucosa without lesions . Neck supple and nontender. No palpable supraclavicular or cervical adenopathy. Normal sized without goiter. Respiratory WNL. No retractions.. Cardiovascular Pedal Pulses WNL. No clubbing, cyanosis or  edema. Musculoskeletal Adexa without tenderness or enlargement.. Digits and nails w/o clubbing, cyanosis, infection, petechiae, ischemia, or inflammatory conditions.. Integumentary (Hair, Skin) No suspicious lesions. the wound on the right lower extremity is completely healed.. No crepitus or fluctuance. No peri-wound warmth or erythema. No masses.Marland Kitchen Psychiatric  Judgement and insight Intact.. No evidence of depression, anxiety, or agitation.. Electronic Signature(s) Signed: 05/10/2015 4:41:39 PM By: Christin Fudge MD, FACS Entered By: Christin Fudge on 05/10/2015 15:22:41 Michelle [name] ([id number]) -------------------------------------------------------------------------------- Physician Orders Details Patient Name: Michelle [name] Date of Service: 05/10/2015 2:30 PM Medical Record Number: [id number] Patient Account Number: [id number] Date of Birth/Sex: [birth year] (81 y.o. Female) Treating RN: Cornell Barman Primary Care Physician: Reed Breech Other Clinician: Referring Physician: Reed Breech Treating Physician/Extender: Frann Rider in Treatment: 7 Verbal / Phone Orders: Yes Clinician: Cornell Barman Read Back and Verified: Yes Diagnosis Coding Discharge From [facility] Services Wound #1 Right Lower Leg o Discharge from [facility] - treatment complete Electronic Signature(s) Signed: 05/10/2015 4:41:39 PM By: Christin Fudge MD, FACS Signed: 05/11/2015 4:20:29 PM By: Gretta Cool RN, BSN, Kim RN, BSN Entered By: Gretta Cool, RN, BSN, Kim on 05/10/2015 15:18:23 Michelle [name] ([id number]) -------------------------------------------------------------------------------- Problem List Details Patient Name: Michelle [name], Michelle [name]. Date of Service: 05/10/2015 2:30 PM Medical Record Number: [id number] Patient Account Number: [id number] Date of Birth/Sex: 1932/07/08 (81 y.o. 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 Female) Treating RN: Primary Care Physician: Reed Breech Other Clinician: Referring Physician: Reed Breech Treating Physician/Extender: Frann Rider in Treatment: 7 Subjective Chief Complaint Information obtained from Patient Patient presents to the wound care center for a consult due non healing wound. Pleasant 81 year old, comes with a injury to her right lower extremity which has been there for about 2 weeks. History of Present Illness (HPI) The following HPI elements were documented for the patient's wound: Location: right lower extremity Quality: Patient reports experiencing a dull pain to affected area(s). Severity: Patient states wound are getting worse. Duration: Patient has had the wound for < 2 weeks prior to presenting for treatment Timing: Pain in wound is Intermittent (comes and goes Context: The wound occurred when the patient had her door slammed into her right leg while she was at home about 2 weeks ago. Modifying Factors: Consults to this date include: application of  hydrogen peroxide and Neosporin. Associated Signs and Symptoms: Patient reports having difficulty standing for long periods. this pleasant 81 year old who has been known to be a diabetic for the last 30 years has been injured with a door slamming into her leg about 2 weeks ago. The patient is a poor historian and we could not get many records from her family practice doctor's office. She has been new using hydrogen peroxide and Neosporin on the wound and says that she squeezed out the scab and some pus. other comorbidities include high blood pressure and hypothyroidism. She says she checks her blood sugar everyday and it has been running fairly good and her last hemoglobin A1c done in November 2015 was 7.4. She has had no x-rays of  her leg done nor she had any significant problems in the recent past. 04/05/2015 - No new complaints. No significant pain. No fever or chills. Improved drainage. Ambulating per her baseline with a cane. Objective Constitutional Pulse regular. Respirations normal and unlabored. Afebrile. Michelle [name], Michelle [name] ([id number]) Vitals Time Taken: 3:01 PM, Height: 62 in, Weight: 140 lbs, BMI: 25.6, Temperature: 98.1 F, Pulse: 64 bpm, Respiratory Rate: 18 breaths/min, Blood Pressure: 115/86 mmHg. Eyes Nonicteric. Reactive to light. Ears, Nose, Mouth, and Throat Lips, teeth, and gums WNL.Marland Kitchen Moist mucosa without lesions . Neck supple and nontender. No palpable supraclavicular or cervical adenopathy. Normal sized without goiter. Respiratory WNL. No retractions.. Cardiovascular Pedal Pulses WNL. No clubbing, cyanosis or edema. Musculoskeletal Adexa without tenderness or enlargement.. Digits and nails w/o clubbing, cyanosis, infection, petechiae, ischemia, or inflammatory conditions.Marland Kitchen Psychiatric Judgement and insight Intact.. No evidence of depression, anxiety, or agitation.. Integumentary (Hair, Skin) No suspicious lesions. the wound on the right lower extremity is  completely healed.. No crepitus or fluctuance. No peri-wound warmth or erythema. No masses.. Wound #1 status is Healed - Epithelialized. Original cause of wound was Trauma. The wound is located on the Right Lower Leg. The wound measures 0cm length x 0cm width x 0cm depth; 0cm^2 area and 0cm^3 volume. The wound is limited to skin breakdown. There is a medium amount of serosanguineous drainage noted. The wound margin is distinct with the outline attached to the wound base. There is large (67-100%) red granulation within the wound bed. There is a small (1-33%) amount of necrotic tissue within the wound bed including Adherent Slough. The periwound skin appearance exhibited: Moist, Erythema. The periwound skin appearance did not exhibit: Callus, Crepitus, Excoriation, Fluctuance, Friable, Induration, Localized Edema, Rash, Scarring, Dry/Scaly, Maceration, Atrophie Blanche, Cyanosis, Ecchymosis, Hemosiderin Staining, Mottled, Pallor, Rubor. The surrounding wound skin color is noted with erythema which is circumferential. Periwound temperature was noted as No Abnormality. 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 She is discharged from the wound care services and be seen back as needed. Plan Discharge From [facility] Services: Wound #1 Right Lower Leg: Discharge from [facility] - treatment complete the wound on the right lower extremity is completely healed. She is discharged from the wound care services and be seen back as needed. Electronic Signature(s) Signed: 05/10/2015 4:41:39 PM By: Christin Fudge MD, FACS Entered By: Christin Fudge on 05/10/2015 15:23:23 Michelle [name]  ([id number]) -------------------------------------------------------------------------------- SuperBill Details Patient Name: Michelle [name] Date of Service: 05/10/2015 Medical Record Number: [id number] Patient Account Number: [id number] Date of Birth/Sex: 1932/03/17 (81 y.o. Female) Treating RN: Primary Care Physician: Reed Breech Other Clinician: Referring Physician: Reed Breech Treating Physician/Extender: Frann Rider in Treatment: 7 Diagnosis Coding ICD-10 Codes Code Description E11.622 Type 2 diabetes mellitus with other skin ulcer L97.812 Non-pressure chronic ulcer of other part of right lower leg with fat layer exposed S81.811A Laceration without foreign body, right lower leg, initial encounter Facility Procedures CPT4 Code: [id number] Description: [phone] - WOUND CARE VISIT-LEV 1 EST PT Modifier: Quantity: 1 Physician Procedures CPT4: Description Modifier Quantity Code [id number] - WC PHYS LEVEL 3 - EST PT 1 ICD-10 Description Diagnosis E11.622 Type 2 diabetes mellitus with other skin ulcer L97.812 Non-pressure chronic  ulcer of other part of right lower leg with fat layer  exposed S81.811A Laceration without foreign body, right lower leg, initial encounter Electronic Signature(s) Signed: 05/10/2015 4:41:39 PM By: Christin Fudge MD, FACS Entered By: Christin Fudge on 05/10/2015 15:23:38

## 2015-06-12 ENCOUNTER — Emergency Department (HOSPITAL_COMMUNITY): Payer: Medicare Other

## 2015-06-12 ENCOUNTER — Inpatient Hospital Stay (HOSPITAL_COMMUNITY)
Admission: EM | Admit: 2015-06-12 | Discharge: 2015-06-15 | DRG: 391 | Disposition: A | Discharge status: Home Health | Payer: Medicare Other | Attending: Internal Medicine | Admitting: Internal Medicine

## 2015-06-12 ENCOUNTER — Inpatient Hospital Stay (HOSPITAL_COMMUNITY): Payer: Medicare Other

## 2015-06-12 ENCOUNTER — Encounter (HOSPITAL_COMMUNITY): Payer: Self-pay | Admitting: Emergency Medicine

## 2015-06-12 DIAGNOSIS — R638 Other symptoms and signs concerning food and fluid intake: Secondary | ICD-10-CM

## 2015-06-12 DIAGNOSIS — N183 Chronic kidney disease, stage 3 unspecified: Secondary | ICD-10-CM | POA: Diagnosis present

## 2015-06-12 DIAGNOSIS — Z7952 Long term (current) use of systemic steroids: Secondary | ICD-10-CM | POA: Diagnosis not present

## 2015-06-12 DIAGNOSIS — Z9049 Acquired absence of other specified parts of digestive tract: Secondary | ICD-10-CM | POA: Diagnosis present

## 2015-06-12 DIAGNOSIS — S299XXA Unspecified injury of thorax, initial encounter: Secondary | ICD-10-CM | POA: Diagnosis not present

## 2015-06-12 DIAGNOSIS — D649 Anemia, unspecified: Secondary | ICD-10-CM | POA: Diagnosis present

## 2015-06-12 DIAGNOSIS — J449 Chronic obstructive pulmonary disease, unspecified: Secondary | ICD-10-CM | POA: Diagnosis not present

## 2015-06-12 DIAGNOSIS — F0390 Unspecified dementia without behavioral disturbance: Secondary | ICD-10-CM | POA: Diagnosis present

## 2015-06-12 DIAGNOSIS — Z66 Do not resuscitate: Secondary | ICD-10-CM | POA: Diagnosis present

## 2015-06-12 DIAGNOSIS — R112 Nausea with vomiting, unspecified: Secondary | ICD-10-CM | POA: Diagnosis present

## 2015-06-12 DIAGNOSIS — R413 Other amnesia: Secondary | ICD-10-CM | POA: Diagnosis present

## 2015-06-12 DIAGNOSIS — G9340 Encephalopathy, unspecified: Secondary | ICD-10-CM | POA: Diagnosis present

## 2015-06-12 DIAGNOSIS — K529 Noninfective gastroenteritis and colitis, unspecified: Principal | ICD-10-CM | POA: Diagnosis present

## 2015-06-12 DIAGNOSIS — G8929 Other chronic pain: Secondary | ICD-10-CM | POA: Diagnosis present

## 2015-06-12 DIAGNOSIS — M1990 Unspecified osteoarthritis, unspecified site: Secondary | ICD-10-CM | POA: Diagnosis present

## 2015-06-12 DIAGNOSIS — Z79899 Other long term (current) drug therapy: Secondary | ICD-10-CM

## 2015-06-12 DIAGNOSIS — J45909 Unspecified asthma, uncomplicated: Secondary | ICD-10-CM | POA: Diagnosis present

## 2015-06-12 DIAGNOSIS — E785 Hyperlipidemia, unspecified: Secondary | ICD-10-CM | POA: Diagnosis present

## 2015-06-12 DIAGNOSIS — E1122 Type 2 diabetes mellitus with diabetic chronic kidney disease: Secondary | ICD-10-CM | POA: Diagnosis present

## 2015-06-12 DIAGNOSIS — S6992XA Unspecified injury of left wrist, hand and finger(s), initial encounter: Secondary | ICD-10-CM | POA: Diagnosis not present

## 2015-06-12 DIAGNOSIS — M25562 Pain in left knee: Secondary | ICD-10-CM | POA: Diagnosis present

## 2015-06-12 DIAGNOSIS — R4182 Altered mental status, unspecified: Secondary | ICD-10-CM | POA: Diagnosis not present

## 2015-06-12 DIAGNOSIS — N289 Disorder of kidney and ureter, unspecified: Secondary | ICD-10-CM | POA: Diagnosis not present

## 2015-06-12 DIAGNOSIS — S60222A Contusion of left hand, initial encounter: Secondary | ICD-10-CM | POA: Diagnosis not present

## 2015-06-12 DIAGNOSIS — Z90710 Acquired absence of both cervix and uterus: Secondary | ICD-10-CM

## 2015-06-12 DIAGNOSIS — N179 Acute kidney failure, unspecified: Secondary | ICD-10-CM | POA: Diagnosis not present

## 2015-06-12 DIAGNOSIS — Z794 Long term (current) use of insulin: Secondary | ICD-10-CM

## 2015-06-12 DIAGNOSIS — E039 Hypothyroidism, unspecified: Secondary | ICD-10-CM

## 2015-06-12 DIAGNOSIS — G43A0 Cyclical vomiting, not intractable: Secondary | ICD-10-CM | POA: Diagnosis not present

## 2015-06-12 DIAGNOSIS — I129 Hypertensive chronic kidney disease with stage 1 through stage 4 chronic kidney disease, or unspecified chronic kidney disease: Secondary | ICD-10-CM | POA: Diagnosis present

## 2015-06-12 DIAGNOSIS — I2510 Atherosclerotic heart disease of native coronary artery without angina pectoris: Secondary | ICD-10-CM | POA: Diagnosis present

## 2015-06-12 DIAGNOSIS — R410 Disorientation, unspecified: Secondary | ICD-10-CM

## 2015-06-12 DIAGNOSIS — J4489 Other specified chronic obstructive pulmonary disease: Secondary | ICD-10-CM | POA: Diagnosis present

## 2015-06-12 DIAGNOSIS — M25561 Pain in right knee: Secondary | ICD-10-CM | POA: Diagnosis present

## 2015-06-12 DIAGNOSIS — E860 Dehydration: Secondary | ICD-10-CM | POA: Diagnosis present

## 2015-06-12 DIAGNOSIS — Z79891 Long term (current) use of opiate analgesic: Secondary | ICD-10-CM

## 2015-06-12 DIAGNOSIS — M353 Polymyalgia rheumatica: Secondary | ICD-10-CM | POA: Diagnosis present

## 2015-06-12 DIAGNOSIS — S59912A Unspecified injury of left forearm, initial encounter: Secondary | ICD-10-CM | POA: Diagnosis not present

## 2015-06-12 DIAGNOSIS — E119 Type 2 diabetes mellitus without complications: Secondary | ICD-10-CM

## 2015-06-12 HISTORY — DX: Hypothyroidism, unspecified: E03.9

## 2015-06-12 LAB — CBC
HCT: 27.8 % — ABNORMAL LOW (ref 36.0–46.0)
HEMOGLOBIN: 9.3 g/dL — AB (ref 12.0–15.0)
MCH: 30.1 pg (ref 26.0–34.0)
MCHC: 33.5 g/dL (ref 30.0–36.0)
MCV: 90 fL (ref 78.0–100.0)
PLATELETS: 196 10*3/uL (ref 150–400)
RBC: 3.09 MIL/uL — ABNORMAL LOW (ref 3.87–5.11)
RDW: 14 % (ref 11.5–15.5)
WBC: 8.4 10*3/uL (ref 4.0–10.5)

## 2015-06-12 LAB — TYPE AND SCREEN
ABO/RH(D): O POS
ANTIBODY SCREEN: NEGATIVE

## 2015-06-12 LAB — URINALYSIS, ROUTINE W REFLEX MICROSCOPIC
GLUCOSE, UA: 500 mg/dL — AB
HGB URINE DIPSTICK: NEGATIVE
KETONES UR: 15 mg/dL — AB
Leukocytes, UA: NEGATIVE
Nitrite: NEGATIVE
PROTEIN: 100 mg/dL — AB
SPECIFIC GRAVITY, URINE: 1.014 (ref 1.005–1.030)
Urobilinogen, UA: 1 mg/dL (ref 0.0–1.0)
pH: 5 (ref 5.0–8.0)

## 2015-06-12 LAB — CBC WITH DIFFERENTIAL/PLATELET
Basophils Absolute: 0 10*3/uL (ref 0.0–0.1)
Basophils Relative: 0 % (ref 0–1)
EOS PCT: 0 % (ref 0–5)
Eosinophils Absolute: 0 10*3/uL (ref 0.0–0.7)
HCT: 31.9 % — ABNORMAL LOW (ref 36.0–46.0)
Hemoglobin: 10.5 g/dL — ABNORMAL LOW (ref 12.0–15.0)
LYMPHS ABS: 0.5 10*3/uL — AB (ref 0.7–4.0)
Lymphocytes Relative: 5 % — ABNORMAL LOW (ref 12–46)
MCH: 30.1 pg (ref 26.0–34.0)
MCHC: 32.9 g/dL (ref 30.0–36.0)
MCV: 91.4 fL (ref 78.0–100.0)
MONO ABS: 0.8 10*3/uL (ref 0.1–1.0)
Monocytes Relative: 8 % (ref 3–12)
Neutro Abs: 8.7 10*3/uL — ABNORMAL HIGH (ref 1.7–7.7)
Neutrophils Relative %: 86 % — ABNORMAL HIGH (ref 43–77)
Platelets: 228 10*3/uL (ref 150–400)
RBC: 3.49 MIL/uL — ABNORMAL LOW (ref 3.87–5.11)
RDW: 13.9 % (ref 11.5–15.5)
WBC: 10.1 10*3/uL (ref 4.0–10.5)

## 2015-06-12 LAB — CBG MONITORING, ED: Glucose-Capillary: 373 mg/dL — ABNORMAL HIGH (ref 65–99)

## 2015-06-12 LAB — COMPREHENSIVE METABOLIC PANEL
ALT: 23 U/L (ref 14–54)
AST: 52 U/L — AB (ref 15–41)
Albumin: 3.4 g/dL — ABNORMAL LOW (ref 3.5–5.0)
Alkaline Phosphatase: 55 U/L (ref 38–126)
Anion gap: 14 (ref 5–15)
BUN: 32 mg/dL — ABNORMAL HIGH (ref 6–20)
CO2: 21 mmol/L — ABNORMAL LOW (ref 22–32)
Calcium: 8.6 mg/dL — ABNORMAL LOW (ref 8.9–10.3)
Chloride: 95 mmol/L — ABNORMAL LOW (ref 101–111)
Creatinine, Ser: 1.69 mg/dL — ABNORMAL HIGH (ref 0.44–1.00)
GFR, EST AFRICAN AMERICAN: 32 mL/min — AB (ref >60)
GFR, EST NON AFRICAN AMERICAN: 27 mL/min — AB (ref >60)
GLUCOSE: 393 mg/dL — AB (ref 65–99)
Potassium: 3.9 mmol/L (ref 3.5–5.1)
Sodium: 130 mmol/L — ABNORMAL LOW (ref 135–145)
TOTAL PROTEIN: 5.7 g/dL — AB (ref 6.5–8.1)
Total Bilirubin: 0.8 mg/dL (ref 0.3–1.2)

## 2015-06-12 LAB — RETICULOCYTES
RBC.: 3.09 MIL/uL — ABNORMAL LOW (ref 3.87–5.11)
RETIC CT PCT: 1.2 % (ref 0.4–3.1)
Retic Count, Absolute: 37.1 10*3/uL (ref 19.0–186.0)

## 2015-06-12 LAB — URINE MICROSCOPIC-ADD ON

## 2015-06-12 LAB — LACTATE DEHYDROGENASE: LDH: 217 U/L — ABNORMAL HIGH (ref 98–192)

## 2015-06-12 LAB — GLUCOSE, CAPILLARY: GLUCOSE-CAPILLARY: 119 mg/dL — AB (ref 65–99)

## 2015-06-12 LAB — PROTIME-INR
INR: 1.14 (ref 0.00–1.49)
PROTHROMBIN TIME: 14.8 s (ref 11.6–15.2)

## 2015-06-12 LAB — APTT: APTT: 31 s (ref 24–37)

## 2015-06-12 MED ORDER — ADULT MULTIVITAMIN W/MINERALS CH
1.0000 | ORAL_TABLET | Freq: Every day | ORAL | Status: DC
  Administered 2015-06-13 – 2015-06-15 (×3): 1 via ORAL
  Filled 2015-06-12 (×3): qty 1

## 2015-06-12 MED ORDER — INSULIN ASPART 100 UNIT/ML ~~LOC~~ SOLN
0.0000 [IU] | Freq: Three times a day (TID) | SUBCUTANEOUS | Status: DC
Start: 2015-06-13 — End: 2015-06-14
  Administered 2015-06-13 (×3): 7 [IU] via SUBCUTANEOUS
  Administered 2015-06-14: 2 [IU] via SUBCUTANEOUS

## 2015-06-12 MED ORDER — PREDNISONE 5 MG PO TABS
5.0000 mg | ORAL_TABLET | Freq: Every day | ORAL | Status: DC
  Administered 2015-06-13 – 2015-06-15 (×3): 5 mg via ORAL
  Filled 2015-06-12 (×3): qty 1

## 2015-06-12 MED ORDER — HYDROCODONE-ACETAMINOPHEN 5-325 MG PO TABS
1.0000 | ORAL_TABLET | Freq: Every day | ORAL | Status: DC
  Administered 2015-06-13 – 2015-06-15 (×3): 1 via ORAL
  Filled 2015-06-12 (×3): qty 1

## 2015-06-12 MED ORDER — GABAPENTIN 100 MG PO CAPS
100.0000 mg | ORAL_CAPSULE | Freq: Every day | ORAL | Status: DC
  Administered 2015-06-12 – 2015-06-14 (×3): 100 mg via ORAL
  Filled 2015-06-12 (×3): qty 1

## 2015-06-12 MED ORDER — SODIUM CHLORIDE 0.9 % IJ SOLN
3.0000 mL | Freq: Two times a day (BID) | INTRAMUSCULAR | Status: DC
  Administered 2015-06-13 (×2): 3 mL via INTRAVENOUS

## 2015-06-12 MED ORDER — ACETAMINOPHEN 650 MG RE SUPP: 650.0000 mg | Freq: Four times a day (QID) | RECTAL | Status: DC | PRN

## 2015-06-12 MED ORDER — SODIUM CHLORIDE 0.9 % IV BOLUS (SEPSIS)
500.0000 mL | Freq: Once | INTRAVENOUS | Status: AC
  Administered 2015-06-12: 500 mL via INTRAVENOUS

## 2015-06-12 MED ORDER — SODIUM CHLORIDE 0.9 % IV BOLUS (SEPSIS)
1000.0000 mL | Freq: Once | INTRAVENOUS | Status: AC
  Administered 2015-06-12: 1000 mL via INTRAVENOUS

## 2015-06-12 MED ORDER — MEMANTINE HCL 10 MG PO TABS
10.0000 mg | ORAL_TABLET | Freq: Every day | ORAL | Status: DC
  Administered 2015-06-14 – 2015-06-15 (×2): 10 mg via ORAL
  Filled 2015-06-12 (×5): qty 1

## 2015-06-12 MED ORDER — HYDROCORTISONE NA SUCCINATE PF 100 MG IJ SOLR
50.0000 mg | Freq: Once | INTRAMUSCULAR | Status: AC
  Administered 2015-06-12: 50 mg via INTRAVENOUS
  Filled 2015-06-12: qty 2

## 2015-06-12 MED ORDER — ACETAMINOPHEN 325 MG PO TABS: 650.0000 mg | ORAL_TABLET | Freq: Four times a day (QID) | ORAL | Status: DC | PRN

## 2015-06-12 MED ORDER — LEVOTHYROXINE SODIUM 88 MCG PO TABS
88.0000 ug | ORAL_TABLET | Freq: Every day | ORAL | Status: DC
  Administered 2015-06-13 – 2015-06-15 (×3): 88 ug via ORAL
  Filled 2015-06-12 (×3): qty 1

## 2015-06-12 MED ORDER — FENTANYL 75 MCG/HR TD PT72
75.0000 ug | MEDICATED_PATCH | TRANSDERMAL | Status: DC
  Administered 2015-06-12: 75 ug via TRANSDERMAL
  Filled 2015-06-12: qty 1

## 2015-06-12 MED ORDER — MORPHINE SULFATE 2 MG/ML IJ SOLN
1.0000 mg | INTRAMUSCULAR | Status: DC | PRN
  Administered 2015-06-12: 1 mg via INTRAVENOUS
  Filled 2015-06-12: qty 1

## 2015-06-12 MED ORDER — HYDROXYZINE HCL 50 MG/ML IM SOLN
25.0000 mg | Freq: Four times a day (QID) | INTRAMUSCULAR | Status: DC | PRN
  Filled 2015-06-12: qty 0.5

## 2015-06-12 MED ORDER — SODIUM CHLORIDE 0.9 % IV SOLN
INTRAVENOUS | Status: DC
  Administered 2015-06-12 – 2015-06-14 (×5): via INTRAVENOUS

## 2015-06-12 MED ORDER — KETOROLAC TROMETHAMINE 30 MG/ML IJ SOLN: 30.0000 mg | Freq: Once | INTRAMUSCULAR | Status: DC

## 2015-06-12 MED ORDER — TIOTROPIUM BROMIDE MONOHYDRATE 18 MCG IN CAPS
18.0000 ug | ORAL_CAPSULE | Freq: Every day | RESPIRATORY_TRACT | Status: DC | PRN
  Administered 2015-06-15: 18 ug via RESPIRATORY_TRACT

## 2015-06-12 MED ORDER — INSULIN GLARGINE 100 UNIT/ML ~~LOC~~ SOLN
10.0000 [IU] | Freq: Every day | SUBCUTANEOUS | Status: DC
  Administered 2015-06-13 – 2015-06-14 (×2): 10 [IU] via SUBCUTANEOUS
  Filled 2015-06-12 (×4): qty 0.1

## 2015-06-12 MED ORDER — PRAVASTATIN SODIUM 20 MG PO TABS
20.0000 mg | ORAL_TABLET | Freq: Every day | ORAL | Status: DC
  Administered 2015-06-13 – 2015-06-14 (×2): 20 mg via ORAL
  Filled 2015-06-12 (×2): qty 1

## 2015-06-12 NOTE — ED Notes (Signed)
Report attempted 

## 2015-06-12 NOTE — ED Notes (Signed)
Pt here with increased confusion x 3 days and N/V; per family pt lives alone but they check on her daily and pt found in recliner Sunday morning as if she had not gone to bed; bruising to left hand noted with unknown cause

## 2015-06-12 NOTE — H&P (Signed)
[facility] History and Physical  KAMBRI DISMORE GBT:[id number] DOB: [birth year] DOA: 06/12/2015  Referring physician: ED physician PCP: Morton Peters, MD  Specialists:   Chief Complaint: Nausea, vomiting, chronic diarrhea, AMS  HPI: JESSY CALIXTE is a 81 y.o. female with PMH of hypertension, COPD, asthma, hypothyroidism, PMR, memory loss, dementia,CKD-III, diabetes mellitus, who presents with nausea, vomiting, altered mental status and chronic diarrhea.  Per patient's two daughters, patient has been having nausea, vomiting in the past 2 days. She vomited 2 or 3 times each day, no blood in the vomitus. Patient has chronic diarrhea . She had a normal colonoscopy on 04/07/13. She is currently taking Imodium. She has 2 or 3 loose bowel movements each day, which is at her baseline. Patient dose not have hematochezia. Patient has mild dementia at baseline, but her mental status seems to be worsening the past 2 days. She looks more confused per her daughters. Does not have fever, chills, abdominal pain, chest pain, shortness breath, cough, symptoms of UTI or unilateral weakness. Patient reports tongue soreness after vomiting. Patient has chronic bilateral knee pain, which is at her baseline. Patient has bruises over her left wrist and hand. Of note, patient's blood pressure is always running low, at ~90s/50s at baseline per daughters.  In ED, patient was found to have WBC 10.1, hemoglobin dropped from 13.0 on 02/16/15-->10.5. INR 1.14, negative urinalysis, temperature normal, heart rate is 67, blood pressure 99/51, worsening renal function, negative chest x-ray, negative CT head for acute abnormalities. X-ray of left hand and left wrist negative for bony fracture.  Where does patient live?   At home    Can patient participate in ADLs?  Barely    Review of Systems:   General: no fevers, chills, no changes in body weight, has poor appetite, has fatigue HEENT: no blurry vision, hearing changes  or sore throat Pulm: no dyspnea, coughing, wheezing CV: no chest pain, palpitations Abd: has nausea, vomiting, diarrhea, no constipation and abdominal pain,  GU: no dysuria, burning on urination, increased urinary frequency, hematuria  Ext: no leg edema. Has bilateral knee pain.  Neuro: no unilateral weakness, numbness, or tingling, no vision change or hearing loss. Has confusion. Skin: no rash MSK: No muscle spasm, no deformity, no limitation of range of movement in spin Heme: No easy bruising.  Travel history: No recent long distant travel.  Allergy: No Known Allergies  Past Medical History  Diagnosis Date  . Type II or unspecified type diabetes mellitus without mention of complication, not stated as uncontrolled   . Dyslipidemia   . Asthma   . Arthritis   . Gallstones   . COPD (chronic obstructive pulmonary disease)   . Interstitial cystitis   . UTI (lower urinary tract infection)   . Malaise and fatigue   . Memory loss   . Coronary atherosclerosis   . Systemic hypertension   . Polymyalgia rheumatica   . Hypothyroidism     Past Surgical History  Procedure Laterality Date  . Lumbar disc surgery    . Total abdominal hysterectomy    . Rotator cuff repair    . Tonsillectomy    . Cervical spine surgery    . Cholecystectomy    . Hernia repair    . Cardiac catheterization  01/29/2006    10% luminal irregularity mid LAD  . Cardiac catheterization  03/15/2001    No significant CAD, no evidence of renal artery stenosis, systemic hypertenion  . US echocardiography  09/14/2007  mild LVH, LA mildly dilated,mild mitral annular calcification, AOV mildly sclerotic, aortic root sclerosis/ca+  . Nm myocar perf wall motion  08/17/2009    normal    Social History:  reports that she has never smoked. She has never used smokeless tobacco. She reports that she does not drink alcohol or use illicit drugs.  Family History:  Family History  Problem Relation Age of Onset  . Breast cancer  Mother   . Heart disease Father   . Lung cancer Brother      Prior to Admission medications   Medication Sig Start Date End Date Taking? Authorizing Provider  fentaNYL (DURAGESIC - DOSED MCG/HR) 75 MCG/HR Place 75 mcg onto the skin every 3 (three) days.  12/26/14  Yes Historical Provider, MD  gabapentin (NEURONTIN) 100 MG capsule Take 100 mg by mouth at bedtime. 05/30/15  Yes Historical Provider, MD  HYDROcodone-acetaminophen (NORCO/VICODIN) 5-325 MG per tablet Take 1 tablet by mouth daily.   Yes Historical Provider, MD  insulin lispro (HUMALOG) 100 UNIT/ML injection Inject 0-6 Units into the skin 3 (three) times daily as needed for high blood sugar. Patient uses sliding scale   Yes Historical Provider, MD  insulin NPH (HUMULIN N,NOVOLIN N) 100 UNIT/ML injection Inject 30 Units into the skin every morning.    Yes Historical Provider, MD  levothyroxine (SYNTHROID, LEVOTHROID) 88 MCG tablet Take 88 mcg by mouth daily.     Yes Historical Provider, MD  LIVALO 4 MG TABS Take 4 mg by mouth every Monday, Wednesday, and Friday. 3 x a week 02/15/13  Yes Historical Provider, MD  loperamide (IMODIUM) 2 MG capsule Take 2 mg by mouth daily.    Yes Historical Provider, MD  memantine (NAMENDA) 10 MG tablet Take 1 tablet (10 mg total) by mouth 2 (two) times daily. Patient taking differently: Take 10 mg by mouth daily.  02/01/15  Yes Marcial Pacas, MD  Multiple Vitamin (MULTIVITAMIN) tablet Take 1 tablet by mouth daily.     Yes Historical Provider, MD  predniSONE (DELTASONE) 5 MG tablet Take 5 mg by mouth daily.     Yes Historical Provider, MD  telmisartan (MICARDIS) 40 MG tablet Take 1 tablet by mouth daily. 02/10/14  Yes Historical Provider, MD  tiotropium (SPIRIVA HANDIHALER) 18 MCG inhalation capsule Place 1 capsule (18 mcg total) into inhaler and inhale daily. Patient taking differently: Place 18 mcg into inhaler and inhale daily as needed (shortness of breath).  02/22/15 02/02/17 Yes Deneise Lever, MD    Physical  Exam: Filed Vitals:   06/12/15 2115 06/12/15 2204 06/12/15 2220 06/13/15 0556  BP: 92/42 95/45 96/42  111/45  Pulse: 63 64 66 68  Temp:  98.6 F (37 C) 99.6 F (37.6 C) 98.4 F (36.9 C)  TempSrc:  Oral Oral Oral  Resp: 18 18 18 18   Height:   5\' 2"  (1.575 m)   Weight:   63.549 kg (140 lb 1.6 oz)   SpO2: 96% 96% 99% 98%   General: Not in acute distress. Dry mucus and membrane. HEENT:       Eyes: PERRL, EOMI, no scleral icterus.       ENT: No discharge from the ears and nose, no pharynx injection, no tonsillar enlargement.        Neck: No JVD, no bruit, no mass felt. Heme: No neck lymph node enlargement. Cardiac: J8/H6, RRR, 2/6 systolic murmurs, No gallops or rubs. Pulm: No rales, wheezing, rhonchi or rubs. Abd: Soft, nondistended, nontender, no rebound pain, no organomegaly,  BS present. Ext: No pitting leg edema bilaterally. 2+DP/PT pulse bilaterally. Musculoskeletal: No joint deformities, No joint redness or warmth, no limitation of ROM in spin. Skin: No rashes.  Neuro: Mildly confused, oriented to place and person, not to time, cranial nerves II-XII grossly intact, muscle strength 5/5 in all extremities, sensation to light touch intact. Brachial reflex 1+ bilaterally. Knee reflex 1+ bilaterally. Negative Babinski's sign. Normal finger to nose test. Psych: Patient is not psychotic, no suicidal or hemocidal ideation.  Labs on Admission:  Basic Metabolic Panel:  Recent Labs Lab 06/12/15 1647 06/13/15 0350  NA 130* 133*  K 3.9 4.8  CL 95* 102  CO2 21* 19*  GLUCOSE 393* 284*  BUN 32* 31*  CREATININE 1.69* 1.61*  CALCIUM 8.6* 7.9*   Liver Function Tests:  Recent Labs Lab 06/12/15 1647 06/13/15 0350  AST 52* 35  ALT 23 19  ALKPHOS 55 45  BILITOT 0.8 1.1  PROT 5.7* 5.2*  ALBUMIN 3.4* 3.0*   No results for input(s): LIPASE, AMYLASE in the last 168 hours. No results for input(s): AMMONIA in the last 168 hours. CBC:  Recent Labs Lab 06/12/15 1647 06/12/15 2238  06/13/15 0350  WBC 10.1 8.4 8.8  NEUTROABS 8.7*  --   --   HGB 10.5* 9.3* 9.3*  HCT 31.9* 27.8* 27.8*  MCV 91.4 90.0 90.8  PLT 228 196 177   Cardiac Enzymes: No results for input(s): CKTOTAL, CKMB, CKMBINDEX, TROPONINI in the last 168 hours.  BNP (last 3 results) No results for input(s): BNP in the last 8760 hours.  ProBNP (last 3 results) No results for input(s): PROBNP in the last 8760 hours.  CBG:  Recent Labs Lab 06/12/15 1640 06/12/15 2250  GLUCAP 373* 119*    Radiological Exams on Admission: Dg Chest 2 View  06/12/2015   CLINICAL DATA:  [place].  EXAM: CHEST - 2 VIEW; LEFT FOREARM - 2 VIEW; LEFT WRIST - COMPLETE 3+ VIEW  COMPARISON:  Chest x-ray 02/21/2014  FINDINGS: Chest x-ray:  The cardiac silhouette, mediastinal and hilar contours are within normal limits and stable. There is tortuosity and calcification of the thoracic aorta. The lungs are clear. No pleural effusion. The bony thorax is intact.  Left wrist:  The joint spaces are maintained. No acute fracture is identified. Extensive vascular calcifications are noted.  Left forearm:  The wrist and elbow joints are maintained. No acute forearm fracture is identified.  IMPRESSION: No acute cardiopulmonary findings.  No acute fracture involving the left forearm or left wrist.   Electronically Signed   By: Marijo Sanes M.D.   On: 06/12/2015 19:05   Dg Forearm Left  06/12/2015   CLINICAL DATA:  [place].  EXAM: CHEST - 2 VIEW; LEFT FOREARM - 2 VIEW; LEFT WRIST - COMPLETE 3+ VIEW  COMPARISON:  Chest x-ray 02/21/2014  FINDINGS: Chest x-ray:  The cardiac silhouette, mediastinal and hilar contours are within normal limits and stable. There is tortuosity and calcification of the thoracic aorta. The lungs are clear. No pleural effusion. The bony thorax is intact.  Left wrist:  The joint spaces are maintained. No acute fracture is identified. Extensive vascular calcifications are noted.  Left forearm:  The wrist and elbow joints are maintained.  No acute forearm fracture is identified.  IMPRESSION: No acute cardiopulmonary findings.  No acute fracture involving the left forearm or left wrist.   Electronically Signed   By: Marijo Sanes M.D.   On: 06/12/2015 19:05   Dg Wrist Complete Left  06/12/2015   CLINICAL DATA:  [place].  EXAM: CHEST - 2 VIEW; LEFT FOREARM - 2 VIEW; LEFT WRIST - COMPLETE 3+ VIEW  COMPARISON:  Chest x-ray 02/21/2014  FINDINGS: Chest x-ray:  The cardiac silhouette, mediastinal and hilar contours are within normal limits and stable. There is tortuosity and calcification of the thoracic aorta. The lungs are clear. No pleural effusion. The bony thorax is intact.  Left wrist:  The joint spaces are maintained. No acute fracture is identified. Extensive vascular calcifications are noted.  Left forearm:  The wrist and elbow joints are maintained. No acute forearm fracture is identified.  IMPRESSION: No acute cardiopulmonary findings.  No acute fracture involving the left forearm or left wrist.   Electronically Signed   By: Marijo Sanes M.D.   On: 06/12/2015 19:05   Ct Head Wo Contrast  06/12/2015   CLINICAL DATA:  Three-day history of progressive confusion  EXAM: CT HEAD WITHOUT CONTRAST  TECHNIQUE: Contiguous axial images were obtained from the base of the skull through the vertex without intravenous contrast.  COMPARISON:  February 24, 2015  FINDINGS: There is mild diffuse atrophy. There is no intracranial mass, hemorrhage, extra-axial fluid collection, or midline shift. There is mild small vessel disease in the centra semiovale bilaterally. Elsewhere gray-white compartments appear normal. No acute infarct evident. Bony calvarium appears intact. The mastoid air cells are clear.  IMPRESSION: Atrophy with mild periventricular small vessel disease. No intracranial mass, hemorrhage, or acute appearing infarct.   Electronically Signed   By: Lowella Grip III M.D.   On: 06/12/2015 17:57   US Renal  06/13/2015   CLINICAL DATA:  Acute onset of  renal insufficiency. Initial encounter.  EXAM: RENAL / URINARY TRACT ULTRASOUND COMPLETE  COMPARISON:  None.  FINDINGS: Right Kidney:  Length: 9.2 cm. Echogenicity within normal limits. No mass or hydronephrosis visualized.  Left Kidney:  Length: 9.2 cm. Echogenicity within normal limits. No mass or hydronephrosis visualized.  Bladder:  Appears normal for degree of bladder distention.  IMPRESSION: Unremarkable renal ultrasound.   Electronically Signed   By: Garald Balding M.D.   On: 06/13/2015 02:17   Dg Hand 2 View Left  06/12/2015   CLINICAL DATA:  Bruising left hand.  Unknown trauma status.  EXAM: LEFT HAND - 2 VIEW  COMPARISON:  None.  FINDINGS: Frontal and lateral views obtained. No demonstrable fracture or dislocation. There is mild narrowing of the first MCP as well as all PIP and DIP joints. No erosive change. There are multiple foci of arterial vascular calcification.  IMPRESSION: Osteoarthritic change in multiple joints. No erosive change. Foci of arterial vascular calcification. No acute fracture or dislocation.   Electronically Signed   By: Lowella Grip III M.D.   On: 06/12/2015 19:01    EKG: Not done in ED, will get one.   Assessment/Plan Principal Problem:   Nausea & vomiting Active Problems:   Diabetes mellitus without complication   Asthma with COPD   Polymyalgia rheumatica   Memory loss   Acute encephalopathy   CAD (coronary artery disease)   Acute renal failure superimposed on stage 3 chronic kidney disease   Normocytic anemia   Chronic diarrhea   HLD (hyperlipidemia)   Nausea & vomiting and chronic diarrhea: Etiology for the patient's new nausea, vomiting is not clear. Likely due to viral gastritis. Patient also has chronic diarrhea with normal colonoscopy in the past. Needed to rule out other possibilities such as a C. difficile colitis. The patient does not have  abdominal pain. Not septic on admission.   -will admit to med-surg bed -check c diff pcr -Hold home  Imodium until C diff pcr negative -IVF: 1.5 L NS and then 100 cc/h -When necessary hydroxyzine for nausea  DM-II: Last A1c not on record. Patient is taking NPH insulin at home -will start lantus 10 units daily -SSI -Check A1c  Asthma with COPD: stable. No acute exacerbation. Chest x-ray is negative. -Continue home spiriva -prn albuterol nebs   Polymyalgia rheumatica: on chronic low-dose prednisone 5 mg daily at home. Stable. -Continue home dose prednisone -give stress dose of Solu Cortef, 50 mg 1 -Check cortisol level  Memory loss and dementia: -[place]  AoCKD-III: Baseline Cre is 1.2-1.3, her creatinine is 1.69 and BNU 32 on admission. Likely due to prerenal secondary to dehydration and continuation of ACEI. - IVF as above - Check FeNa  - US-renal - Follow up renal function by BMP - hold Temisartan  Acute encephalopathy: CT head is negative for acute abnormalities. Likely due to nausea, vomiting and worsening renal function. -neuro check will currently -Treat underlying issues as above  Normocytic anemia: Hemoglobin dropped from 13.0 on 02/16/15. Etiology is not clear. -check FOBT -check LDH and hapatoglobine -cbc q6h - INR/PTT/type & screen -Anemia panel  Hypothyroidism: Last TSH was 0.68 on 02/14/13 -Continue home Synthroid -Check TSH  HLD: Last LDL was not on record -pravastaine -Check FLP   DVT ppx: SQ Heparin   Code Status: DNR Family Communication: Yes, patient's daughters at bed side Disposition Plan: Admit to inpatient   Date of Service 06/13/2015    Ivor Costa [facility] Pager [phone]  If 7PM-7AM, please contact night-coverage [email or web address] Password [facility] 06/13/2015, 6:13 AM

## 2015-06-12 NOTE — ED Provider Notes (Signed)
History   Chief Complaint  Patient presents with  . Altered Mental Status    HPI  Michelle [name] is a 81 y.o. female with PMH as below notable for T2DM, COPD, HTN, asthma, dementia who presents to ED with c/o AMS and vomiting.   Daughters providing hx today.   Sat night pt was fine. Sun AM, couldn't get ahold of pt. Found sitting in chair, with stool/urine/emesis on her. Pt was very confused. BS was 80 at that time. Since then, pt has been  "out of it." Pt c/o tongue soreness. Daughters say pt has continued to be very confused and is still vomiting 3-4 x daily (NBNB). Dec PO intake. Feel pt is dehdrated. BS today was 500. No reported f/c. Hx limited 2/2 confusion.  Past medical/surgical history, social history, medications, allergies and FH have been reviewed with patient and/or in documentation. Furthermore, if pt family or friend(s) present, additional historical information was obtained from them.  Past Medical History  Diagnosis Date  . Type II or unspecified type diabetes mellitus without mention of complication, not stated as uncontrolled   . Dyslipidemia   . Asthma   . Arthritis   . Gallstones   . COPD (chronic obstructive pulmonary disease)   . Interstitial cystitis   . UTI (lower urinary tract infection)   . Malaise and fatigue   . Memory loss   . Coronary atherosclerosis   . Systemic hypertension   . Polymyalgia rheumatica    Past Surgical History  Procedure Laterality Date  . Lumbar disc surgery    . Total abdominal hysterectomy    . Rotator cuff repair    . Tonsillectomy    . Cervical spine surgery    . Cholecystectomy    . Hernia repair    . Cardiac catheterization  01/29/2006    10% luminal irregularity mid LAD  . Cardiac catheterization  03/15/2001    No significant CAD, no evidence of renal artery stenosis, systemic hypertenion  . US echocardiography  09/14/2007    mild LVH, LA mildly dilated,mild mitral annular calcification, AOV mildly sclerotic, aortic  root sclerosis/ca+  . Nm myocar perf wall motion  08/17/2009    normal   Family History  Problem Relation Age of Onset  . Breast cancer Mother   . Heart disease Father   . Lung cancer Brother    History  Substance Use Topics  . Smoking status: Never Smoker   . Smokeless tobacco: Never Used  . Alcohol Use: No     Review of Systems Unable to obtain 2/2 AMS   Physical Exam  Physical Exam  ED Triage Vitals  Enc Vitals Group     BP 06/12/15 1635 105/44 mmHg     Pulse Rate 06/12/15 1635 67     Resp 06/12/15 1635 16     Temp 06/12/15 1635 98.6 F (37 C)     Temp Source 06/12/15 1635 Oral     SpO2 06/12/15 1635 98 %     Weight --      Height --      Head Cir --      Peak Flow --      Pain Score --      Pain Loc --      Pain Edu? --      Excl. in [place]? --    Constitutional: Patient is chronically ill appearing elderly fem and in no acute distress Head: Normocephalic and atraumatic.  Eyes: Extraocular motion intact, no  scleral icterus Mouth: MMM, OP clear Neck: Supple without meningismus, mass, or overt JVD Respiratory: No respiratory distress. Normal WOB. No w/r/g. CV: RRR, no obvious murmurs.  Pulses +2 and symmetric. Euvolemic Abdomen: Soft, NT, ND, no r/g. No mass.  MSK: Extremities are atraumatic without deformity, ROM intact Skin: Warm, dry, intact without rash Neuro: HDS, AAOx4. PERRL, EOMI, TML, face sym. CN 2-12 grossly intact. 5/5 sym, no drift, SILT, normal coordination.   ED Course  Procedures   Labs Reviewed  COMPREHENSIVE METABOLIC PANEL - Abnormal; Notable for the following:    Sodium 130 (*)    Chloride 95 (*)    CO2 21 (*)    Glucose, Bld 393 (*)    BUN 32 (*)    Creatinine, Ser 1.69 (*)    Calcium 8.6 (*)    Total Protein 5.7 (*)    Albumin 3.4 (*)    AST 52 (*)    GFR calc non Af Amer 27 (*)    GFR calc Af Amer 32 (*)    All other components within normal limits  CBC WITH DIFFERENTIAL/PLATELET - Abnormal; Notable for the following:     RBC 3.49 (*)    Hemoglobin 10.5 (*)    HCT 31.9 (*)    Neutrophils Relative % 86 (*)    Neutro Abs 8.7 (*)    Lymphocytes Relative 5 (*)    Lymphs Abs 0.5 (*)    All other components within normal limits  URINALYSIS, ROUTINE W REFLEX MICROSCOPIC (NOT AT [facility]) - Abnormal; Notable for the following:    Color, Urine AMBER (*)    Glucose, UA 500 (*)    Bilirubin Urine MODERATE (*)    Ketones, ur 15 (*)    Protein, ur 100 (*)    All other components within normal limits  GLUCOSE, CAPILLARY - Abnormal; Notable for the following:    Glucose-Capillary 119 (*)    All other components within normal limits  CBG MONITORING, ED - Abnormal; Notable for the following:    Glucose-Capillary 373 (*)    All other components within normal limits  URINE CULTURE  C DIFFICILE QUICK SCAN W PCR REFLEX  PROTIME-INR  URINE MICROSCOPIC-ADD ON  CORTISOL-AM, BLOOD  TSH  HEMOGLOBIN A1C  CREATININE, URINE, RANDOM  SODIUM, URINE, RANDOM  CBC  CBC  CBC  VITAMIN B12  FOLATE  IRON AND TIBC  FERRITIN  RETICULOCYTES  APTT  COMPREHENSIVE METABOLIC PANEL  LACTATE DEHYDROGENASE  HAPTOGLOBIN  TYPE AND SCREEN   I personally reviewed and interpreted all labs.  CT Head Wo Contrast    (Results Pending)   I personally viewed above image(s) which were used in my medical decision making. Formal interpretations by Radiology.  MDM: SARISSA DERN is a 81 y.o. female with H&P as above who p/w CC: AMS, N/V/D  -Initial impression: HDS, NAD. Benign exam as above. -Ordered: given age, AMS, risk factors, screening labs, IVF, EKG, CXR, HCT sent  -Results: AKI. No obvious infection identifited. HCT neg. Given pt is still confused and recently had volume loss and mild dehydration, will admit to medicine. -Re-evaluation: remains HDS, NAD.  Old records reviewed (if available). Labs and imaging reviewed personally by myself and considered in medical decision making if ordered. Clinical Impression: 1. AKI (acute  kidney injury)   2. Hypothyroidism, unspecified hypothyroidism type   3. Non-intractable vomiting with nausea, vomiting of unspecified type   4. Dehydration symptoms   5. Confusion     Disposition: Admit  Condition: stable  I have discussed the results, Dx and Tx plan with the pt(& family if present). He/she/they expressed understanding and agree(s) with the plan.  Pt seen in conjunction with Dr. Hart Carwin, [facility] Resident - PGY-3     Kirstie Peri, MD 06/12/15 [id number]  Leonard Schwartz, MD 06/19/15 [phone]

## 2015-06-12 NOTE — Progress Notes (Signed)
Patient arrived to 4N27 AAOx4 but weak. No complaints of nausea or vomiting recently. Family at the bedside. Vitals taken and BP low. Fluids running, patient complaining of pain in the knees. Will continue to monitor. Kjuan Seipp, Rande Brunt, RN

## 2015-06-13 ENCOUNTER — Encounter (HOSPITAL_COMMUNITY): Payer: Self-pay | Admitting: *Deleted

## 2015-06-13 DIAGNOSIS — N179 Acute kidney failure, unspecified: Secondary | ICD-10-CM

## 2015-06-13 DIAGNOSIS — N183 Chronic kidney disease, stage 3 (moderate): Secondary | ICD-10-CM

## 2015-06-13 DIAGNOSIS — G9340 Encephalopathy, unspecified: Secondary | ICD-10-CM

## 2015-06-13 DIAGNOSIS — E785 Hyperlipidemia, unspecified: Secondary | ICD-10-CM | POA: Diagnosis present

## 2015-06-13 LAB — GLUCOSE, CAPILLARY
GLUCOSE-CAPILLARY: 314 mg/dL — AB (ref 65–99)
GLUCOSE-CAPILLARY: 234 mg/dL — AB (ref 65–99)
GLUCOSE-CAPILLARY: 184 mg/dL — AB (ref 65–99)
Glucose-Capillary: 338 mg/dL — ABNORMAL HIGH (ref 65–99)
Glucose-Capillary: 306 mg/dL — ABNORMAL HIGH (ref 65–99)

## 2015-06-13 LAB — COMPREHENSIVE METABOLIC PANEL
ALT: 19 U/L (ref 14–54)
AST: 35 U/L (ref 15–41)
Albumin: 3 g/dL — ABNORMAL LOW (ref 3.5–5.0)
Alkaline Phosphatase: 45 U/L (ref 38–126)
Anion gap: 12 (ref 5–15)
BILIRUBIN TOTAL: 1.1 mg/dL (ref 0.3–1.2)
BUN: 31 mg/dL — ABNORMAL HIGH (ref 6–20)
CALCIUM: 7.9 mg/dL — AB (ref 8.9–10.3)
CO2: 19 mmol/L — AB (ref 22–32)
Chloride: 102 mmol/L (ref 101–111)
Creatinine, Ser: 1.61 mg/dL — ABNORMAL HIGH (ref 0.44–1.00)
GFR calc non Af Amer: 29 mL/min — ABNORMAL LOW (ref >60)
GFR, EST AFRICAN AMERICAN: 34 mL/min — AB (ref >60)
GLUCOSE: 284 mg/dL — AB (ref 65–99)
POTASSIUM: 4.8 mmol/L (ref 3.5–5.1)
SODIUM: 133 mmol/L — AB (ref 135–145)
TOTAL PROTEIN: 5.2 g/dL — AB (ref 6.5–8.1)

## 2015-06-13 LAB — LIPID PANEL
CHOLESTEROL: 189 mg/dL (ref 0–200)
HDL: 62 mg/dL (ref >40)
LDL Cholesterol: 119 mg/dL — ABNORMAL HIGH (ref 0–99)
Total CHOL/HDL Ratio: 3 RATIO
Triglycerides: 39 mg/dL (ref <150)
VLDL: 8 mg/dL (ref 0–40)

## 2015-06-13 LAB — CBC
HCT: 27.8 % — ABNORMAL LOW (ref 36.0–46.0)
HCT: 26.1 % — ABNORMAL LOW (ref 36.0–46.0)
HCT: 27.8 % — ABNORMAL LOW (ref 36.0–46.0)
HCT: 26.5 % — ABNORMAL LOW (ref 36.0–46.0)
HEMOGLOBIN: 9.4 g/dL — AB (ref 12.0–15.0)
Hemoglobin: 9.3 g/dL — ABNORMAL LOW (ref 12.0–15.0)
Hemoglobin: 8.8 g/dL — ABNORMAL LOW (ref 12.0–15.0)
Hemoglobin: 8.8 g/dL — ABNORMAL LOW (ref 12.0–15.0)
MCH: 30.4 pg (ref 26.0–34.0)
MCH: 30.9 pg (ref 26.0–34.0)
MCH: 30.7 pg (ref 26.0–34.0)
MCH: 29.9 pg (ref 26.0–34.0)
MCHC: 33.5 g/dL (ref 30.0–36.0)
MCHC: 33.7 g/dL (ref 30.0–36.0)
MCHC: 33.8 g/dL (ref 30.0–36.0)
MCHC: 33.2 g/dL (ref 30.0–36.0)
MCV: 90.8 fL (ref 78.0–100.0)
MCV: 91.6 fL (ref 78.0–100.0)
MCV: 90.8 fL (ref 78.0–100.0)
MCV: 90.1 fL (ref 78.0–100.0)
PLATELETS: 177 10*3/uL (ref 150–400)
Platelets: 172 10*3/uL (ref 150–400)
Platelets: 197 10*3/uL (ref 150–400)
Platelets: 190 10*3/uL (ref 150–400)
RBC: 3.06 MIL/uL — ABNORMAL LOW (ref 3.87–5.11)
RBC: 2.85 MIL/uL — ABNORMAL LOW (ref 3.87–5.11)
RBC: 3.06 MIL/uL — ABNORMAL LOW (ref 3.87–5.11)
RBC: 2.94 MIL/uL — AB (ref 3.87–5.11)
RDW: 13.9 % (ref 11.5–15.5)
RDW: 13.9 % (ref 11.5–15.5)
RDW: 13.9 % (ref 11.5–15.5)
RDW: 14 % (ref 11.5–15.5)
WBC: 8.8 10*3/uL (ref 4.0–10.5)
WBC: 8.6 10*3/uL (ref 4.0–10.5)
WBC: 8.2 10*3/uL (ref 4.0–10.5)
WBC: 7.5 10*3/uL (ref 4.0–10.5)

## 2015-06-13 LAB — IRON AND TIBC
Iron: 15 ug/dL — ABNORMAL LOW (ref 28–170)
SATURATION RATIOS: 6 % — AB (ref 10.4–31.8)
TIBC: 235 ug/dL — ABNORMAL LOW (ref 250–450)
UIBC: 220 ug/dL

## 2015-06-13 LAB — CREATININE, URINE, RANDOM: CREATININE, URINE: 144.81 mg/dL

## 2015-06-13 LAB — FERRITIN: Ferritin: 145 ng/mL (ref 11–307)

## 2015-06-13 LAB — FOLATE: FOLATE: 31.3 ng/mL (ref >5.9)

## 2015-06-13 LAB — CORTISOL-AM, BLOOD: CORTISOL - AM: 56.1 ug/dL — AB (ref 6.7–22.6)

## 2015-06-13 LAB — TSH: TSH: 1.855 u[IU]/mL (ref 0.350–4.500)

## 2015-06-13 LAB — VITAMIN B12: VITAMIN B 12: 1682 pg/mL — AB (ref 180–914)

## 2015-06-13 MED ORDER — ALBUTEROL SULFATE (2.5 MG/3ML) 0.083% IN NEBU
2.5000 mg | INHALATION_SOLUTION | RESPIRATORY_TRACT | Status: DC | PRN
  Administered 2015-06-15: 2.5 mg via RESPIRATORY_TRACT
  Filled 2015-06-13: qty 3

## 2015-06-13 NOTE — Progress Notes (Addendum)
Nutrition Brief Note  Patient identified on the Malnutrition Screening Tool (MST) Report.  Wt Readings from Last 15 Encounters:  06/12/15 140 lb 1.6 oz (63.549 kg)  02/22/15 138 lb 6.4 oz (62.778 kg)  02/01/15 137 lb (62.143 kg)  02/21/14 146 lb 6.4 oz (66.407 kg)  01/16/14 143 lb (64.864 kg)  11/24/13 143 lb (64.864 kg)  05/23/13 151 lb (68.493 kg)  05/16/13 148 lb 4 oz (67.246 kg)  04/07/13 150 lb (68.04 kg)  03/21/13 150 lb 2 oz (68.096 kg)  02/22/13 151 lb (68.493 kg)  02/14/13 147 lb 6.4 oz (66.86 kg)  02/23/12 159 lb (72.122 kg)  08/22/11 163 lb 3.2 oz (74.027 kg)  02/20/11 171 lb (77.565 kg)    Body mass index is 25.62 kg/(m^2). Patient meets criteria for Overweight based on current BMI.   Current diet order is Heart Healthy/Carbohydrate Modified.  Patient consumed approximately 75% of her lunch today.  No complaints of N/V at this time.  Labs and medications reviewed.   No nutrition interventions warranted at this time. If nutrition issues arise, please consult RD.   Arthur Holms, RD, LDN Pager #: [phone] After-Hours Pager #: [phone]

## 2015-06-13 NOTE — Plan of Care (Addendum)
RN had heart to heart conversation with both daughters and patient about the safety concerns of pt continuing to live at home alone.  Currently, family has been caring for mom(pt) for several years, but have basically reached their limits. All still work and cannot continue to be available as needed. 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  For now, sounds  like they'll have to settle for looking for hired help to assist, without living/moving in the home.

## 2015-06-13 NOTE — Plan of Care (Signed)
Pt had large loose BM but contaminated with urine.  Unable to send to lab.  Dr. Informed and stated the urine sodium is no longer needed, but will order Stool PCR instead of CDIFF screen.

## 2015-06-13 NOTE — Progress Notes (Signed)
[facility] PROGRESS NOTE  DAE ANTONUCCI ZJI:[id number] DOB: May 28, 1932 DOA: 06/12/2015 PCP: Morton Peters, MD  Assessment/Plan:  Principal Problem:   Nausea & vomiting: resolved Active Problems:   Chronic diarrhea: stool studies pending Dehydration/hypotension. Continue IVF   Diabetes mellitus without complication   Asthma with COPD   Polymyalgia rheumatica   Memory loss   Acute encephalopathy: baseline unknown.   CAD (coronary artery disease)   Acute renal failure superimposed on stage 3 chronic kidney disease   Normocytic anemia   HLD (hyperlipidemia)  PT rec 24 hour supervision. If not available, SNF  Code Status:  full Family Communication:  Left message with family Disposition Plan:  ?  Consultants:    Procedures:     Antibiotics:    HPI/Subjective: Confused. Per RN, still with loose stool.  Per RN, no vomiting. Tolerating diet.  Objective: Filed Vitals:   06/13/15 1043  BP: 116/45  Pulse: 60  Temp: 98.6 F (37 C)  Resp: 16   No intake or output data in the 24 hours ending 06/13/15 1521 Filed Weights   06/12/15 2220  Weight: 63.549 kg (140 lb 1.6 oz)    Exam:   General:  In chair. Forgetful. Cooperative  HEENT: dry MM  Cardiovascular: RRR without MGR   Respiratory: CTA without WRR  Abdomen: S, NT ND  Ext: no CCE  Basic Metabolic Panel:  Recent Labs Lab 06/12/15 1647 06/13/15 0350  NA 130* 133*  K 3.9 4.8  CL 95* 102  CO2 21* 19*  GLUCOSE 393* 284*  BUN 32* 31*  CREATININE 1.69* 1.61*  CALCIUM 8.6* 7.9*   Liver Function Tests:  Recent Labs Lab 06/12/15 1647 06/13/15 0350  AST 52* 35  ALT 23 19  ALKPHOS 55 45  BILITOT 0.8 1.1  PROT 5.7* 5.2*  ALBUMIN 3.4* 3.0*   No results for input(s): LIPASE, AMYLASE in the last 168 hours. No results for input(s): AMMONIA in the last 168 hours. CBC:  Recent Labs Lab 06/12/15 1647 06/12/15 2238 06/13/15 0350 06/13/15 1040  WBC 10.1 8.4 8.8 8.6   NEUTROABS 8.7*  --   --   --   HGB 10.5* 9.3* 9.3* 8.8*  HCT 31.9* 27.8* 27.8* 26.1*  MCV 91.4 90.0 90.8 91.6  PLT 228 196 177 172   Cardiac Enzymes: No results for input(s): CKTOTAL, CKMB, CKMBINDEX, TROPONINI in the last 168 hours. BNP (last 3 results) No results for input(s): BNP in the last 8760 hours.  ProBNP (last 3 results) No results for input(s): PROBNP in the last 8760 hours.  CBG:  Recent Labs Lab 06/12/15 1640 06/12/15 2250 06/13/15 0658 06/13/15 1140  GLUCAP 373* 119* 338* 314*    Recent Results (from the past 240 hour(s))  Urine culture     Status: None (Preliminary result)   Collection Time: 06/12/15  7:37 PM  Result Value Ref Range Status   Specimen Description URINE, CATHETERIZED  Final   Special Requests NONE  Final   Culture CULTURE REINCUBATED FOR BETTER GROWTH  Final   Report Status PENDING  Incomplete     Studies: Dg Chest 2 View  06/12/2015   CLINICAL DATA:  [place].  EXAM: CHEST - 2 VIEW; LEFT FOREARM - 2 VIEW; LEFT WRIST - COMPLETE 3+ VIEW  COMPARISON:  Chest x-ray 02/21/2014  FINDINGS: Chest x-ray:  The cardiac silhouette, mediastinal and hilar contours are within normal limits and stable. There is tortuosity and calcification of the thoracic aorta. The lungs are clear. No  pleural effusion. The bony thorax is intact.  Left wrist:  The joint spaces are maintained. No acute fracture is identified. Extensive vascular calcifications are noted.  Left forearm:  The wrist and elbow joints are maintained. No acute forearm fracture is identified.  IMPRESSION: No acute cardiopulmonary findings.  No acute fracture involving the left forearm or left wrist.   Electronically Signed   By: Marijo Sanes M.D.   On: 06/12/2015 19:05   Dg Forearm Left  06/12/2015   CLINICAL DATA:  [place].  EXAM: CHEST - 2 VIEW; LEFT FOREARM - 2 VIEW; LEFT WRIST - COMPLETE 3+ VIEW  COMPARISON:  Chest x-ray 02/21/2014  FINDINGS: Chest x-ray:  The cardiac silhouette, mediastinal and hilar  contours are within normal limits and stable. There is tortuosity and calcification of the thoracic aorta. The lungs are clear. No pleural effusion. The bony thorax is intact.  Left wrist:  The joint spaces are maintained. No acute fracture is identified. Extensive vascular calcifications are noted.  Left forearm:  The wrist and elbow joints are maintained. No acute forearm fracture is identified.  IMPRESSION: No acute cardiopulmonary findings.  No acute fracture involving the left forearm or left wrist.   Electronically Signed   By: Marijo Sanes M.D.   On: 06/12/2015 19:05   Dg Wrist Complete Left  06/12/2015   CLINICAL DATA:  [place].  EXAM: CHEST - 2 VIEW; LEFT FOREARM - 2 VIEW; LEFT WRIST - COMPLETE 3+ VIEW  COMPARISON:  Chest x-ray 02/21/2014  FINDINGS: Chest x-ray:  The cardiac silhouette, mediastinal and hilar contours are within normal limits and stable. There is tortuosity and calcification of the thoracic aorta. The lungs are clear. No pleural effusion. The bony thorax is intact.  Left wrist:  The joint spaces are maintained. No acute fracture is identified. Extensive vascular calcifications are noted.  Left forearm:  The wrist and elbow joints are maintained. No acute forearm fracture is identified.  IMPRESSION: No acute cardiopulmonary findings.  No acute fracture involving the left forearm or left wrist.   Electronically Signed   By: Marijo Sanes M.D.   On: 06/12/2015 19:05   Ct Head Wo Contrast  06/12/2015   CLINICAL DATA:  Three-day history of progressive confusion  EXAM: CT HEAD WITHOUT CONTRAST  TECHNIQUE: Contiguous axial images were obtained from the base of the skull through the vertex without intravenous contrast.  COMPARISON:  February 24, 2015  FINDINGS: There is mild diffuse atrophy. There is no intracranial mass, hemorrhage, extra-axial fluid collection, or midline shift. There is mild small vessel disease in the centra semiovale bilaterally. Elsewhere gray-white compartments appear normal.  No acute infarct evident. Bony calvarium appears intact. The mastoid air cells are clear.  IMPRESSION: Atrophy with mild periventricular small vessel disease. No intracranial mass, hemorrhage, or acute appearing infarct.   Electronically Signed   By: Lowella Grip III M.D.   On: 06/12/2015 17:57   US Renal  06/13/2015   CLINICAL DATA:  Acute onset of renal insufficiency. Initial encounter.  EXAM: RENAL / URINARY TRACT ULTRASOUND COMPLETE  COMPARISON:  None.  FINDINGS: Right Kidney:  Length: 9.2 cm. Echogenicity within normal limits. No mass or hydronephrosis visualized.  Left Kidney:  Length: 9.2 cm. Echogenicity within normal limits. No mass or hydronephrosis visualized.  Bladder:  Appears normal for degree of bladder distention.  IMPRESSION: Unremarkable renal ultrasound.   Electronically Signed   By: Garald Balding M.D.   On: 06/13/2015 02:17   Dg Hand 2 View Left  06/12/2015   CLINICAL DATA:  Bruising left hand.  Unknown trauma status.  EXAM: LEFT HAND - 2 VIEW  COMPARISON:  None.  FINDINGS: Frontal and lateral views obtained. No demonstrable fracture or dislocation. There is mild narrowing of the first MCP as well as all PIP and DIP joints. No erosive change. There are multiple foci of arterial vascular calcification.  IMPRESSION: Osteoarthritic change in multiple joints. No erosive change. Foci of arterial vascular calcification. No acute fracture or dislocation.   Electronically Signed   By: Lowella Grip III M.D.   On: 06/12/2015 19:01    Scheduled Meds: . fentaNYL  75 mcg Transdermal Q72H  . gabapentin  100 mg Oral QHS  . HYDROcodone-acetaminophen  1 tablet Oral Daily  . insulin aspart  0-9 Units Subcutaneous TID WC  . insulin glargine  10 Units Subcutaneous Daily  . levothyroxine  88 mcg Oral QAC breakfast  . memantine  10 mg Oral Daily  . multivitamin with minerals  1 tablet Oral Daily  . pravastatin  20 mg Oral q1800  . predniSONE  5 mg Oral Daily  . sodium chloride  3 mL  Intravenous Q12H   Continuous Infusions: . sodium chloride 100 mL/hr at 06/12/15 2215    Time spent: 25 minutes  [facility] [email or web address], password [facility] 06/13/2015, 3:21 PM  LOS: 1 day

## 2015-06-13 NOTE — Evaluation (Signed)
Occupational Therapy Evaluation Patient Details Name: Michelle [name] MRN: [id number] DOB: 1932-06-15 Today's Date: 06/13/2015    History of Present Illness Michelle [name] is a 81 y.o. female with PMH of hypertension, COPD, asthma, hypothyroidism, PMR, memory loss, dementia,CKD-III, diabetes mellitus, who presents with nausea, vomiting, altered mental status and chronic diarrhea.   Clinical Impression   Pt performed most selfcare tasks with overall supervision level.  Discussed with daughter the need for pt to use the RW for safety at home as well as needing supervision for showering as well as shower tub seat.  She is going to check as she feels they may have one.  Currently, with slightly decreased balance and overall strength from baseline but feel she should progress fast.  Will continue to follow for acute care OT in order to increase back to modified independent level.  HHOT also recommended for follow-up.     Follow Up Recommendations  Home health OT;Supervision/Assistance - 24 hour    Equipment Recommendations  Other (comment) (family to check on shower/tub seat)       Precautions / Restrictions Precautions Precautions: Fall Restrictions Weight Bearing Restrictions: No      Mobility Bed Mobility Overal bed mobility: Needs Assistance Bed Mobility: Supine to Sit     Supine to sit: Supervision     General bed mobility comments: pt with good technique  Transfers Overall transfer level: Needs assistance Equipment used: Rolling walker (2 wheeled) Transfers: Sit to/from [employer] Sit to Stand: Supervision Stand pivot transfers: Supervision       General transfer comment: min instructional cueing for safe hand placement    Balance Overall balance assessment: Needs assistance Sitting-balance support: Feet supported;No upper extremity supported Sitting balance-Leahy Scale: Good     Standing balance support: Single extremity supported;During  functional activity Standing balance-Leahy Scale: Fair Standing balance comment: attempted to pull underwear up but required min/modA for stability and to complete task                            ADL Overall ADL's : Needs assistance/impaired Eating/Feeding: Independent;Sitting   Grooming: Wash/dry hands;Wash/dry face;Supervision/safety;Standing   Upper Body Bathing: Set up;Sitting   Lower Body Bathing: Supervison/ safety;Sitting/lateral leans   Upper Body Dressing : Set up;Sitting   Lower Body Dressing: Supervision/safety;Sit to/from stand   Toilet Transfer: Supervision/safety;RW;Grab bars;Comfort height toilet;Ambulation   Toileting- Clothing Manipulation and Hygiene: Supervision/safety;Sit to/from stand       Functional mobility during ADLs: Supervision/safety General ADL Comments: Pt overall supervision for selfcare tasks sit to stand.  Recommend shower seat at home which pt's daughter states she has.  Also recommend initial supervison for safety as well.       Vision Vision Assessment?: No apparent visual deficits   Perception Perception Perception Tested?: No   Praxis Praxis Praxis tested?: Not tested    Pertinent Vitals/Pain Pain Assessment: No/denies pain     Hand Dominance Right   Extremity/Trunk Assessment Upper Extremity Assessment Upper Extremity Assessment: Overall WFL for tasks assessed   Lower Extremity Assessment Lower Extremity Assessment: Defer to PT evaluation   Cervical / Trunk Assessment Cervical / Trunk Assessment: Kyphotic   Communication Communication Communication: No difficulties   Cognition Arousal/Alertness: Awake/alert Behavior During Therapy: WFL for tasks assessed/performed Overall Cognitive Status: History of cognitive impairments - at baseline Area of Impairment: Safety/judgement;Awareness     Memory: Decreased short-term memory   Safety/Judgement: Decreased awareness of safety;Decreased awareness of  deficits Awareness: Intellectual   General Comments: pt unaware of having loose stool, pt found getting up despite being told to use the call light and wait for help              Home Living Family/patient expects to be discharged to:: Private residence Living Arrangements: Alone Available Help at Discharge: Family;Available PRN/intermittently Type of Home: House Home Access: Stairs to enter [employer] of Steps: 2 Entrance Stairs-Rails: Can reach both Home Layout: One level     Bathroom Shower/Tub: Teacher, early years/pre: Standard Bathroom Accessibility: Yes   Home Equipment: Environmental consultant - 2 wheels;Cane - single point          Prior Functioning/Environment Level of Independence: Independent with assistive device(s)        Comments: used cane and RW depending on the day. pt reports she could drive but hasn't been lateley    OT Diagnosis: Generalized weakness;Cognitive deficits   OT Problem List: Decreased strength;Impaired balance (sitting and/or standing);Decreased knowledge of use of DME or AE      OT Goals(Current goals can be found in the care plan section) Acute Rehab OT Goals Patient Stated Goal: Pt wants to get back to her daily routine and get her strength back OT Goal Formulation: With patient Time For Goal Achievement: 06/27/15 Potential to Achieve Goals: Good  OT Frequency:                End of Session Equipment Utilized During Treatment: Rolling walker Nurse Communication: Mobility status  Activity Tolerance: Patient tolerated treatment well Patient left: in chair;with family/visitor present   Time: 1547-1630 OT Time Calculation (min): 43 min Charges:  OT General Charges $OT Visit: 1 Procedure OT Evaluation $Initial OT Evaluation Tier I: 1 Procedure OT Treatments $Self Care/Home Management : 23-37 mins  Patryk Conant OTR/L 06/13/2015, 4:59 PM

## 2015-06-13 NOTE — Progress Notes (Signed)
Results for KRYSTIANNA, SOTH (MRN [id number]) as of 06/13/2015 15:24  Ref. Range 02/27/2015 01:13 06/12/2015 16:40 06/12/2015 22:50 06/13/2015 06:58 06/13/2015 11:40  Glucose-Capillary Latest Ref Range: 65-99 mg/dL 267 (H) 373 (H) 119 (H) 338 (H) 314 (H)  CBGs continue to be greater than 180 mg/dl.  Noted that patient takes NPH 30 units daily and Humalog 0-6 units TID per sliding scale at home. Recommend increasing Lantus to 20 units daily. Will continue to follow while in hospital. Harvel Ricks RN BSN CDE

## 2015-06-13 NOTE — Plan of Care (Signed)
Pt had urine and liquid stool again with attempts to collect, but stool was contaminated with urine again.

## 2015-06-13 NOTE — Plan of Care (Signed)
RN attempted to collect need stool PCR by manual extraction, but was unsuccessful.  Patient had nothing to collect. 3rd shift will be asked to attempt again as they can.

## 2015-06-13 NOTE — Evaluation (Signed)
Physical Therapy Evaluation Patient Details Name: Michelle [name] MRN: [id number] DOB: 1932-01-05 Today's Date: 06/13/2015   History of Present Illness  Michelle [name] is a 81 y.o. female with PMH of hypertension, COPD, asthma, hypothyroidism, PMR, memory loss, dementia,CKD-III, diabetes mellitus, who presents with nausea, vomiting, altered mental status and chronic diarrhea.  Clinical Impression  Pt presenting with mild confusion. Pt oriented but reports she hasn't had a loose stool since yesterday when RN reports she's had 2 today. Pt with decreased safety awareness, impaired balance, generalized weakness and is at an increased risk of falling. At this time pt is unsafe to return home alone and would benefit from ST-SNF to address these deficits and achieve safe mod I level of function for safe transition home.     Follow Up Recommendations SNF;Supervision/Assistance - 24 hour    Equipment Recommendations  None recommended by PT    Recommendations for Other Services       Precautions / Restrictions Precautions Precautions: Fall Restrictions Weight Bearing Restrictions: No      Mobility  Bed Mobility Overal bed mobility: Needs Assistance Bed Mobility: Supine to Sit     Supine to sit: Supervision     General bed mobility comments: pt with good technique  Transfers Overall transfer level: Needs assistance Equipment used: Rolling walker (2 wheeled) Transfers: Sit to/from Stand Sit to Stand: Min guard         General transfer comment: v/c's for safe hand placement, minA due to patient with loose stool   Ambulation/Gait Ambulation/Gait assistance: Min assist Ambulation Distance (Feet): 10 Feet (into bathroom) Assistive device: Rolling walker (2 wheeled) Gait Pattern/deviations: Step-through pattern;Decreased stride length Gait velocity: decreased Gait velocity interpretation: Below normal speed for age/gender General Gait Details: minA for safe walker management and  stability. pt with con't loose stool  Stairs            Wheelchair Mobility    Modified Rankin (Stroke Patients Only)       Balance Overall balance assessment: Needs assistance Sitting-balance support: Feet supported;No upper extremity supported Sitting balance-Leahy Scale: Good     Standing balance support: Single extremity supported;During functional activity Standing balance-Leahy Scale: Poor Standing balance comment: attempted to pull underwear up but required min/modA for stability and to complete task                             Pertinent Vitals/Pain Pain Assessment: No/denies pain    Home Living Family/patient expects to be discharged to:: Private residence Living Arrangements: Alone Available Help at Discharge: Family;Available PRN/intermittently Type of Home: House Home Access: Stairs to enter Entrance Stairs-Rails: Can reach both Entrance Stairs-Number of Steps: 2 Home Layout: One level Home Equipment: Walker - 2 wheels;Cane - single point      Prior Function Level of Independence: Independent with assistive device(s)         Comments: used cane and RW depending on the day. pt reports she could drive but hasn't been lateley     Hand Dominance   Dominant Hand: Right    Extremity/Trunk Assessment   Upper Extremity Assessment: Defer to OT evaluation           Lower Extremity Assessment: Generalized weakness (pt wears bilat knee braces)      Cervical / Trunk Assessment: Kyphotic  Communication   Communication: No difficulties  Cognition Arousal/Alertness: Awake/alert Behavior During Therapy: WFL for tasks assessed/performed Overall Cognitive Status: History of cognitive impairments -  at baseline Area of Impairment: Safety/judgement;Awareness     Memory: Decreased short-term memory   Safety/Judgement: Decreased awareness of safety;Decreased awareness of deficits Awareness: Intellectual   General Comments: pt unaware of  having loose stool, pt found getting up despite being told to use the call light and wait for help    General Comments General comments (skin integrity, edema, etc.): pt required assist for hygiene due to instability in standing and inability to reach in sitting    Exercises        Assessment/Plan    PT Assessment Patient needs continued PT services  PT Diagnosis Generalized weakness   PT Problem List Decreased strength;Decreased range of motion;Decreased activity tolerance;Decreased balance;Decreased mobility  PT Treatment Interventions DME instruction;Gait training;Stair training;Functional mobility training;Therapeutic activities;Therapeutic exercise;Balance training   PT Goals (Current goals can be found in the Care Plan section) Acute Rehab PT Goals Patient Stated Goal: didn't state PT Goal Formulation: With patient Time For Goal Achievement: 06/20/15 Potential to Achieve Goals: Good    Frequency Min 3X/week   Barriers to discharge Decreased caregiver support lives alone    Co-evaluation               End of Session Equipment Utilized During Treatment: Gait belt Activity Tolerance: Other (comment) (limited by loose stool) Patient left:  (on BSC, instructed to pull call light) Nurse Communication: Mobility status (pt on commode, RN to go into room)         Time: 6433-2951 PT Time Calculation (min) (ACUTE ONLY): 28 min   Charges:   PT Evaluation $Initial PT Evaluation Tier I: 1 Procedure PT Treatments $Therapeutic Activity: 8-22 mins   PT G CodesKingsley Callander 06/13/2015, 4:31 PM   Kittie Plater, PT, DPT Pager #: [phone] Office #: [phone]

## 2015-06-14 DIAGNOSIS — G43A0 Cyclical vomiting, not intractable: Secondary | ICD-10-CM

## 2015-06-14 LAB — BASIC METABOLIC PANEL
ANION GAP: 8 (ref 5–15)
BUN: 39 mg/dL — ABNORMAL HIGH (ref 6–20)
CO2: 21 mmol/L — ABNORMAL LOW (ref 22–32)
Calcium: 7.7 mg/dL — ABNORMAL LOW (ref 8.9–10.3)
Chloride: 101 mmol/L (ref 101–111)
Creatinine, Ser: 1.4 mg/dL — ABNORMAL HIGH (ref 0.44–1.00)
GFR, EST AFRICAN AMERICAN: 40 mL/min — AB (ref >60)
GFR, EST NON AFRICAN AMERICAN: 34 mL/min — AB (ref >60)
Glucose, Bld: 158 mg/dL — ABNORMAL HIGH (ref 65–99)
POTASSIUM: 3.8 mmol/L (ref 3.5–5.1)
SODIUM: 130 mmol/L — AB (ref 135–145)

## 2015-06-14 LAB — GLUCOSE, CAPILLARY
GLUCOSE-CAPILLARY: 162 mg/dL — AB (ref 65–99)
GLUCOSE-CAPILLARY: 175 mg/dL — AB (ref 65–99)
GLUCOSE-CAPILLARY: 195 mg/dL — AB (ref 65–99)
Glucose-Capillary: 256 mg/dL — ABNORMAL HIGH (ref 65–99)

## 2015-06-14 LAB — CBC
HCT: 25.5 % — ABNORMAL LOW (ref 36.0–46.0)
Hemoglobin: 8.7 g/dL — ABNORMAL LOW (ref 12.0–15.0)
MCH: 30.6 pg (ref 26.0–34.0)
MCHC: 34.1 g/dL (ref 30.0–36.0)
MCV: 89.8 fL (ref 78.0–100.0)
PLATELETS: 175 10*3/uL (ref 150–400)
RBC: 2.84 MIL/uL — AB (ref 3.87–5.11)
RDW: 14 % (ref 11.5–15.5)
WBC: 5.3 10*3/uL (ref 4.0–10.5)

## 2015-06-14 LAB — HEMOGLOBIN A1C
HEMOGLOBIN A1C: 7.7 % — AB (ref 4.8–5.6)
Mean Plasma Glucose: 174 mg/dL

## 2015-06-14 LAB — HAPTOGLOBIN: HAPTOGLOBIN: 136 mg/dL (ref 34–200)

## 2015-06-14 MED ORDER — INSULIN NPH (HUMAN) (ISOPHANE) 100 UNIT/ML ~~LOC~~ SUSP: 20.0000 [IU] | Freq: Every day | SUBCUTANEOUS | Status: DC

## 2015-06-14 MED ORDER — INSULIN ASPART 100 UNIT/ML ~~LOC~~ SOLN: 0.0000 [IU] | Freq: Every day | SUBCUTANEOUS | Status: DC

## 2015-06-14 MED ORDER — INSULIN NPH (HUMAN) (ISOPHANE) 100 UNIT/ML ~~LOC~~ SUSP: 30.0000 [IU] | SUBCUTANEOUS | Status: DC

## 2015-06-14 MED ORDER — LOPERAMIDE HCL 2 MG PO CAPS
2.0000 mg | ORAL_CAPSULE | Freq: Three times a day (TID) | ORAL | Status: DC
  Administered 2015-06-14 – 2015-06-15 (×2): 2 mg via ORAL
  Filled 2015-06-14 (×2): qty 1

## 2015-06-14 MED ORDER — INSULIN ASPART 100 UNIT/ML ~~LOC~~ SOLN
0.0000 [IU] | Freq: Three times a day (TID) | SUBCUTANEOUS | Status: DC
  Administered 2015-06-14: 3 [IU] via SUBCUTANEOUS
  Administered 2015-06-14: 8 [IU] via SUBCUTANEOUS
  Administered 2015-06-15: 3 [IU] via SUBCUTANEOUS
  Administered 2015-06-15: 8 [IU] via SUBCUTANEOUS

## 2015-06-14 MED ORDER — TRAZODONE HCL 50 MG PO TABS: 50.0000 mg | ORAL_TABLET | Freq: Every evening | ORAL | Status: DC | PRN

## 2015-06-14 NOTE — Clinical Social Work Note (Signed)
Clinical Social Work Assessment  Patient Details  Name: Michelle [name] MRN: [id number] Date of Birth: [birth year]  Date of referral:  06/14/15               Reason for consult:  Facility Placement                Housing/Transportation Living arrangements for the past 2 months:  Single Family Home Source of Information:  Patient Patient Interpreter Needed:  None Criminal Activity/Legal Involvement Pertinent to Current Situation/Hospitalization:  No - Comment as needed Significant Relationships:  Adult Children, Friend Lives with:  Self Do you feel safe going back to the place where you live?  Yes Need for family participation in patient care:  No (Coment)  Care giving concerns: N/A   Facilities manager / plan:  CSW met the pt at bedside. CSW introduced self and purpose of the visit. CSW discussed SNF rehab. Pt reported that she would like to work with PT again. Pt reported that she prefers to go home, but if she need rehab she will go. CSW explained the SNF process. CSW explained insurance and its relation to SNF placement. CSW provided the pt with a SNF list. CSW and pt discussed geographic location in which the she would like to receive rehab. The pt reported that she will like to remain in [facility]. CSW answered all questions in which the pt inquired about. CSW will continue to follow this pt and assist with discharge as needed.   Employment status:  Retired Forensic scientist:  Medicare PT Recommendations:  [place] / Referral to community resources:  [facility]  Patient/Family's Response to care:  Pt reported that she has been cared for well.   Patient/Family's Understanding of and Emotional Response to Diagnosis, Current Treatment, and Prognosis:  Pt acknowledged the treatment she is receiving. Pt reported that she would like to get better so she can return back home.   Emotional Assessment Appearance:  Appears stated  age Attitude/Demeanor/Rapport:   (Cheerful ) Affect (typically observed):  Accepting, Happy, Pleasant Orientation:  Oriented to Self, Oriented to Place, Oriented to Situation, Oriented to  Time Alcohol / Substance use:  Not Applicable Psych involvement (Current and /or in the community):  No (Comment)  Discharge Needs  Concerns to be addressed:  Denies Needs/Concerns at this time Readmission within the last 30 days:  No Current discharge risk:  None Barriers to Discharge:  No Barriers Identified   Roselynne Lortz, LCSW 06/14/2015, 7:59 AM

## 2015-06-14 NOTE — Progress Notes (Signed)
[facility] PROGRESS NOTE  Michelle [name] TMA:[id number] DOB: 04/05/1932 DOA: 06/12/2015 PCP: Morton Peters, MD  Assessment/Plan:  Principal Problem:   Nausea & vomiting: resolved Active Problems:   Chronic diarrhea: stool studies pending Dehydration/hypotension. Continue IVF   Diabetes mellitus without complication   Asthma with COPD   Polymyalgia rheumatica   Memory loss   Acute encephalopathy: baseline unknown.   CAD (coronary artery disease)   Acute renal failure superimposed on stage 3 chronic kidney disease   Normocytic anemia   HLD (hyperlipidemia)  GI pathogen panel pending. Still somewhat dehydrated. Continue IVF. Home v. snf tomorrow  Code Status:  full Family Communication:  Ldaughters Disposition Plan:  ?  Consultants:    Procedures:     Antibiotics:    HPI/Subjective: Stools more formed.  Per RN, no vomiting. Tolerating diet.  Objective: Filed Vitals:   06/14/15 0947  BP: 134/58  Pulse: 64  Temp: 98.1 F (36.7 C)  Resp: 18    Intake/Output Summary (Last 24 hours) at 06/14/15 1045 Last data filed at 06/14/15 0824  Gross per 24 hour  Intake   1680 ml  Output      3 ml  Net   1677 ml   Filed Weights   06/12/15 2220  Weight: 63.549 kg (140 lb 1.6 oz)    Exam:   General:  "my kids were in the other room talking all night"  HEENT: dry MM  Cardiovascular: RRR without MGR   Respiratory: CTA without WRR  Abdomen: S, NT ND  Ext: no CCE  Basic Metabolic Panel:  Recent Labs Lab 06/12/15 1647 06/13/15 0350 06/14/15 0400  NA 130* 133* 130*  K 3.9 4.8 3.8  CL 95* 102 101  CO2 21* 19* 21*  GLUCOSE 393* 284* 158*  BUN 32* 31* 39*  CREATININE 1.69* 1.61* 1.40*  CALCIUM 8.6* 7.9* 7.7*   Liver Function Tests:  Recent Labs Lab 06/12/15 1647 06/13/15 0350  AST 52* 35  ALT 23 19  ALKPHOS 55 45  BILITOT 0.8 1.1  PROT 5.7* 5.2*  ALBUMIN 3.4* 3.0*   No results for input(s): LIPASE, AMYLASE in the last 168  hours. No results for input(s): AMMONIA in the last 168 hours. CBC:  Recent Labs Lab 06/12/15 1647  06/13/15 0350 06/13/15 1040 06/13/15 1558 06/13/15 2157 06/14/15 0400  WBC 10.1  < > 8.8 8.6 8.2 7.5 5.3  NEUTROABS 8.7*  --   --   --   --   --   --   HGB 10.5*  < > 9.3* 8.8* 9.4* 8.8* 8.7*  HCT 31.9*  < > 27.8* 26.1* 27.8* 26.5* 25.5*  MCV 91.4  < > 90.8 91.6 90.8 90.1 89.8  PLT 228  < > 177 172 197 190 175  < > = values in this interval not displayed. Cardiac Enzymes: No results for input(s): CKTOTAL, CKMB, CKMBINDEX, TROPONINI in the last 168 hours. BNP (last 3 results) No results for input(s): BNP in the last 8760 hours.  ProBNP (last 3 results) No results for input(s): PROBNP in the last 8760 hours.  CBG:  Recent Labs Lab 06/13/15 1140 06/13/15 1631 06/13/15 2129 06/13/15 2225 06/14/15 0637  GLUCAP 314* 306* 234* 184* 162*    Recent Results (from the past 240 hour(s))  Urine culture     Status: None (Preliminary result)   Collection Time: 06/12/15  7:37 PM  Result Value Ref Range Status   Specimen Description URINE, CATHETERIZED  Final  Special Requests NONE  Final   Culture CULTURE REINCUBATED FOR BETTER GROWTH  Final   Report Status PENDING  Incomplete     Studies: Dg Chest 2 View  06/12/2015   CLINICAL DATA:  [place].  EXAM: CHEST - 2 VIEW; LEFT FOREARM - 2 VIEW; LEFT WRIST - COMPLETE 3+ VIEW  COMPARISON:  Chest x-ray 02/21/2014  FINDINGS: Chest x-ray:  The cardiac silhouette, mediastinal and hilar contours are within normal limits and stable. There is tortuosity and calcification of the thoracic aorta. The lungs are clear. No pleural effusion. The bony thorax is intact.  Left wrist:  The joint spaces are maintained. No acute fracture is identified. Extensive vascular calcifications are noted.  Left forearm:  The wrist and elbow joints are maintained. No acute forearm fracture is identified.  IMPRESSION: No acute cardiopulmonary findings.  No acute fracture  involving the left forearm or left wrist.   Electronically Signed   By: Marijo Sanes M.D.   On: 06/12/2015 19:05   Dg Forearm Left  06/12/2015   CLINICAL DATA:  [place].  EXAM: CHEST - 2 VIEW; LEFT FOREARM - 2 VIEW; LEFT WRIST - COMPLETE 3+ VIEW  COMPARISON:  Chest x-ray 02/21/2014  FINDINGS: Chest x-ray:  The cardiac silhouette, mediastinal and hilar contours are within normal limits and stable. There is tortuosity and calcification of the thoracic aorta. The lungs are clear. No pleural effusion. The bony thorax is intact.  Left wrist:  The joint spaces are maintained. No acute fracture is identified. Extensive vascular calcifications are noted.  Left forearm:  The wrist and elbow joints are maintained. No acute forearm fracture is identified.  IMPRESSION: No acute cardiopulmonary findings.  No acute fracture involving the left forearm or left wrist.   Electronically Signed   By: Marijo Sanes M.D.   On: 06/12/2015 19:05   Dg Wrist Complete Left  06/12/2015   CLINICAL DATA:  [place].  EXAM: CHEST - 2 VIEW; LEFT FOREARM - 2 VIEW; LEFT WRIST - COMPLETE 3+ VIEW  COMPARISON:  Chest x-ray 02/21/2014  FINDINGS: Chest x-ray:  The cardiac silhouette, mediastinal and hilar contours are within normal limits and stable. There is tortuosity and calcification of the thoracic aorta. The lungs are clear. No pleural effusion. The bony thorax is intact.  Left wrist:  The joint spaces are maintained. No acute fracture is identified. Extensive vascular calcifications are noted.  Left forearm:  The wrist and elbow joints are maintained. No acute forearm fracture is identified.  IMPRESSION: No acute cardiopulmonary findings.  No acute fracture involving the left forearm or left wrist.   Electronically Signed   By: Marijo Sanes M.D.   On: 06/12/2015 19:05   Ct Head Wo Contrast  06/12/2015   CLINICAL DATA:  Three-day history of progressive confusion  EXAM: CT HEAD WITHOUT CONTRAST  TECHNIQUE: Contiguous axial images were obtained from  the base of the skull through the vertex without intravenous contrast.  COMPARISON:  February 24, 2015  FINDINGS: There is mild diffuse atrophy. There is no intracranial mass, hemorrhage, extra-axial fluid collection, or midline shift. There is mild small vessel disease in the centra semiovale bilaterally. Elsewhere gray-white compartments appear normal. No acute infarct evident. Bony calvarium appears intact. The mastoid air cells are clear.  IMPRESSION: Atrophy with mild periventricular small vessel disease. No intracranial mass, hemorrhage, or acute appearing infarct.   Electronically Signed   By: Lowella Grip III M.D.   On: 06/12/2015 17:57   US Renal  06/13/2015  CLINICAL DATA:  Acute onset of renal insufficiency. Initial encounter.  EXAM: RENAL / URINARY TRACT ULTRASOUND COMPLETE  COMPARISON:  None.  FINDINGS: Right Kidney:  Length: 9.2 cm. Echogenicity within normal limits. No mass or hydronephrosis visualized.  Left Kidney:  Length: 9.2 cm. Echogenicity within normal limits. No mass or hydronephrosis visualized.  Bladder:  Appears normal for degree of bladder distention.  IMPRESSION: Unremarkable renal ultrasound.   Electronically Signed   By: Garald Balding M.D.   On: 06/13/2015 02:17   Dg Hand 2 View Left  06/12/2015   CLINICAL DATA:  Bruising left hand.  Unknown trauma status.  EXAM: LEFT HAND - 2 VIEW  COMPARISON:  None.  FINDINGS: Frontal and lateral views obtained. No demonstrable fracture or dislocation. There is mild narrowing of the first MCP as well as all PIP and DIP joints. No erosive change. There are multiple foci of arterial vascular calcification.  IMPRESSION: Osteoarthritic change in multiple joints. No erosive change. Foci of arterial vascular calcification. No acute fracture or dislocation.   Electronically Signed   By: Lowella Grip III M.D.   On: 06/12/2015 19:01    Scheduled Meds: . fentaNYL  75 mcg Transdermal Q72H  . gabapentin  100 mg Oral QHS  .  HYDROcodone-acetaminophen  1 tablet Oral Daily  . insulin aspart  0-9 Units Subcutaneous TID WC  . insulin NPH Human  20 Units Subcutaneous BH-q7a  . levothyroxine  88 mcg Oral QAC breakfast  . memantine  10 mg Oral Daily  . multivitamin with minerals  1 tablet Oral Daily  . pravastatin  20 mg Oral q1800  . predniSONE  5 mg Oral Daily  . sodium chloride  3 mL Intravenous Q12H   Continuous Infusions: . sodium chloride 100 mL/hr at 06/14/15 0340    Time spent: 25 minutes  [facility] [email or web address], password [facility] 06/14/2015, 10:45 AM  LOS: 2 days

## 2015-06-14 NOTE — Clinical Social Work Placement (Signed)
   CLINICAL SOCIAL WORK PLACEMENT  NOTE  Date:  06/14/2015  Patient Details  Name: Michelle [name] MRN: [id number] Date of Birth: May 30, 1932  Clinical Social Work is seeking post-discharge placement for this patient at the [place] level of care (*CSW will initial, date and re-position this form in  chart as items are completed):  Yes   Patient/family provided with [place] Work Department's list of facilities offering this level of care within the geographic area requested by the patient (or if unable, by the patient's family).  Yes   Patient/family informed of their freedom to choose among providers that offer the needed level of care, that participate in Medicare, Medicaid or managed care program needed by the patient, have an available bed and are willing to accept the patient.  Yes   Patient/family informed of [place]'s ownership interest in [facility] and [facility], as well as of the fact that they are under no obligation to receive care at these facilities.  PASRR submitted to EDS on 06/14/15     PASRR number received on 06/14/15     Existing PASRR number confirmed on       FL2 transmitted to all facilities in geographic area requested by pt/family on 06/14/15     FL2 transmitted to all facilities within larger geographic area on       Patient informed that his/her managed care company has contracts with or will negotiate with certain facilities, including the following:            Patient/family informed of bed offers received.  Patient chooses bed at       Physician recommends and patient chooses bed at      Patient to be transferred to   on  .  Patient to be transferred to facility by       Patient family notified on   of transfer.  Name of family member notified:        PHYSICIAN       Additional Comment:    _______________________________________________ Greta Doom, LCSW 06/14/2015, 8:16 AM

## 2015-06-14 NOTE — Clinical Social Work Note (Signed)
CSW received a call form the pt's daughter Jackelyn Poling. Debbie reported that she will not allow the pt to transition to a SNF. Debbie reported that she will take the pt home and provided 24 hour care for the pt. CSW will sign off. CSW updated the case manager.   Smyth, MSW, [place]

## 2015-06-14 NOTE — Progress Notes (Signed)
Physical Therapy Treatment Patient Details Name: Michelle [name] MRN: [id number] DOB: 16-Apr-1932 Today's Date: 06/14/2015    History of Present Illness SHENEKA SCHROM is a 81 y.o. female with PMH of hypertension, COPD, asthma, hypothyroidism, PMR, memory loss, dementia,CKD-III, diabetes mellitus, who presents with nausea, vomiting, altered mental status and chronic diarrhea.    PT Comments    Patient and family report that patient is having "vertigo" today.  They report that she was treated for dizziness by PT about 5 years ago.  Initiated vestibular testing - at this point question motion sensitivity.  Provided patient and family with exercises and compensation techniques.  Will need to continue assessment in am.    Agree with recommendation for SNF at discharge, however per LCSW note, family declining SNF.  Patient will need 24 hour assistance for mobility/safety at home.  Would recommend HHPT to continue PT for mobility and dizziness.   Follow Up Recommendations  Supervision/Assistance - 24 hour;Home health PT for mobility and vestibular rehab. (Recommend SNF, but family declining.)     Equipment Recommendations  None recommended by PT    Recommendations for Other Services       Precautions / Restrictions Precautions Precautions: Fall Precaution Comments: Patient states the last time she fell was a year ago.  Daughter reports 3-6 months ago in her house.  Patient has bruise on Rt frontal area of head that looks fairly recent - states she fell into the doorway when asked. Restrictions Weight Bearing Restrictions: No    Mobility  Bed Mobility               General bed mobility comments: Patient in chair as PT entered room  Transfers Overall transfer level: Needs assistance Equipment used: Rolling walker (2 wheeled) Transfers: Sit to/from Stand Sit to Stand: Min guard         General transfer comment: Repeated verbal cues for hand placement during repeated transfers.   Had patient try to use visual target during transfers to help decrease dizziness.  Patient had difficulty keeping her head up and eyes on target.  Ambulation/Gait Ambulation/Gait assistance: Min assist Ambulation Distance (Feet): 5 Feet Assistive device: Rolling walker (2 wheeled) Gait Pattern/deviations: Step-through pattern;Decreased step length - right;Decreased step length - left;Decreased stride length;Shuffle;Trunk flexed Gait velocity: decreased Gait velocity interpretation: Below normal speed for age/gender General Gait Details: Patient reports dizziness with attempts at gait.  Ambulated 5' with RW and min assist for balance/safety x2.    Stairs            Wheelchair Mobility    Modified Rankin (Stroke Patients Only)       Balance           Standing balance support: Bilateral upper extremity supported Standing balance-Leahy Scale: Poor Standing balance comment: Dizziness impacting balance today.                    Cognition Arousal/Alertness: Awake/alert Behavior During Therapy: WFL for tasks assessed/performed;Flat affect Overall Cognitive Status: History of cognitive impairments - at baseline (Per family, cognition has decreased) Area of Impairment: Orientation;Memory;Following commands;Safety/judgement;Awareness Orientation Level: Disoriented to;Situation (Patient talked about the "other place" that she was last nig)   Memory: Decreased short-term memory Following Commands: Follows one step commands with increased time;Follows multi-step commands inconsistently Safety/Judgement: Decreased awareness of safety;Decreased awareness of deficits     General Comments: Patient unable to recall recent falls.  Confused about where she was last night (in this same room).  Had  great difficulty following two-step commands.    Exercises Other Exercises Other Exercises: x1 exercises horizontally and vertically    General Comments General comments (skin  integrity, edema, etc.): Patient and family report patient with dizziness today - asking for "that medicine for dizziness".  Contacted nurse for this request.  Patient with increased dizziness with head movement in chair, when attempting to scoot forward in chair, when moving to standing, and during gait.  Patient describes as ongoing, lightheadedness.  Noted negative orthostatic BP's taken near admission.  Attempted to test smooth pursuits and saccades - appeared normal (patient with difficulty following directions).  Noted positive head shaking horizontally and vertically with "dizzy" feelings.  Negative head thrust.  Question motion sensitivity.  Instructed patient and family on x1 exercises horizontally and vertically, and started using visual targets during mobility (sit <> stand today).  Will continue assessment at next session.      Pertinent Vitals/Pain Pain Assessment: No/denies pain    Home Living                      Prior Function            PT Goals (current goals can now be found in the care plan section) Progress towards PT goals: Not progressing toward goals - comment (due to dizziness today)    Frequency  Min 3X/week    PT Plan Discharge plan needs to be updated    Co-evaluation             End of Session Equipment Utilized During Treatment: Gait belt Activity Tolerance: Patient limited by fatigue;Other (comment) (Limited by dizziness) Patient left: in chair;with call bell/phone within reach;with chair alarm set;with family/visitor present     Time: 8811-0315 PT Time Calculation (min) (ACUTE ONLY): 47 min  Charges:  $Gait Training: 8-22 mins $Therapeutic Activity: 23-37 mins                    G Codes:      Despina Pole 2015-07-06, 8:04 PM Carita Pian. Sanjuana Kava, [place] Pager [phone]

## 2015-06-15 DIAGNOSIS — R112 Nausea with vomiting, unspecified: Secondary | ICD-10-CM

## 2015-06-15 LAB — BASIC METABOLIC PANEL
Anion gap: 9 (ref 5–15)
BUN: 24 mg/dL — AB (ref 6–20)
CO2: 20 mmol/L — AB (ref 22–32)
CREATININE: 1.02 mg/dL — AB (ref 0.44–1.00)
Calcium: 7.8 mg/dL — ABNORMAL LOW (ref 8.9–10.3)
Chloride: 105 mmol/L (ref 101–111)
GFR calc Af Amer: 59 mL/min — ABNORMAL LOW (ref >60)
GFR calc non Af Amer: 51 mL/min — ABNORMAL LOW (ref >60)
GLUCOSE: 243 mg/dL — AB (ref 65–99)
POTASSIUM: 4.4 mmol/L (ref 3.5–5.1)
SODIUM: 134 mmol/L — AB (ref 135–145)

## 2015-06-15 LAB — GLUCOSE, CAPILLARY
GLUCOSE-CAPILLARY: 274 mg/dL — AB (ref 65–99)
Glucose-Capillary: 159 mg/dL — ABNORMAL HIGH (ref 65–99)

## 2015-06-15 LAB — URINE CULTURE: Culture: 40000

## 2015-06-15 MED ORDER — INSULIN NPH (HUMAN) (ISOPHANE) 100 UNIT/ML ~~LOC~~ SUSP: 30.0000 [IU] | Freq: Every day | SUBCUTANEOUS | Status: DC

## 2015-06-15 NOTE — Discharge Summary (Signed)
Physician Discharge Summary  Michelle [name] OEV:[id number] DOB: 1932-01-27 DOA: 06/12/2015  PCP: Morton Peters, MD  Admit date: 06/12/2015 Discharge date: 06/15/2015  Time spent: greater than 30 minutes  Recommendations for Outpatient Follow-up:  1. Needs 24 hour supervision, family to provide 2. GI pathogen panel pending at discharge  Discharge Diagnoses:  Principal Problem:   Nausea & vomiting likely acute gastroenteritis Active Problems: dehydration   Diabetes mellitus without complication   Asthma with COPD   Polymyalgia rheumatica   Memory loss   Acute encephalopathy   CAD (coronary artery disease)   Acute renal failure superimposed on stage 3 chronic kidney disease   Normocytic anemia   Chronic diarrhea   HLD (hyperlipidemia)   Discharge Condition: stable  Diet recommendation: heart healthy carbohydrate modified.  Filed Weights   06/12/15 2220  Weight: 63.549 kg (140 lb 1.6 oz)    History of present illness:  81 y.o. female with PMH of hypertension, COPD, asthma, hypothyroidism, PMR, memory loss, dementia,CKD-III, diabetes mellitus, who presents with nausea, vomiting, altered mental status and chronic diarrhea.  Per patient's two daughters, patient has been having nausea, vomiting in the past 2 days. She vomited 2 or 3 times each day, no blood in the vomitus. Patient has chronic diarrhea . She had a normal colonoscopy on 04/07/13. She is currently taking Imodium. She has 2 or 3 loose bowel movements each day, which is at her baseline. Patient dose not have hematochezia. Patient has mild dementia at baseline, but her mental status seems to be worsening the past 2 days. She looks more confused per her daughters. Does not have fever, chills, abdominal pain, chest pain, shortness breath, cough, symptoms of UTI or unilateral weakness. Patient reports tongue soreness after vomiting. Patient has chronic bilateral knee pain, which is at her baseline. Patient has bruises over  her left wrist and hand. Of note, patient's blood pressure is always running low, at ~90s/50s at baseline per daughters.  In ED, patient was found to have WBC 10.1, hemoglobin dropped from 13.0 on 02/16/15-->10.5. INR 1.14, negative urinalysis, temperature normal, heart rate is 67, blood pressure 99/51, worsening renal function, negative chest x-ray, negative CT head for acute abnormalities. X-ray of left hand and left wrist negative for bony fracture.  Hospital Course:  Fluid rescussitated. GI pathogen panel collected and is pending, but family reports chronic diarrhea unchanged.  Had no further vomiting.  Mental status improved. Worked with PT OT who recommend SNF v. 24 hour supervision. Family will take patient home and can provide 24 hour supervision.  Vital signs normal and tolerating regular diet at discharge  Procedures: none Consultations:  none  Discharge Exam: Filed Vitals:   06/15/15 0544  BP: 157/64  Pulse: 62  Temp: 98.5 F (36.9 C)  Resp: 17    General: a and o Cardiovascular: RRR Respiratory: CTA abd s, nt, nd  Discharge Instructions   Discharge Instructions    Diet - low sodium heart healthy    Complete by:  As directed      Discharge instructions    Complete by:  As directed   Drink plenty of fluids     Walk with assistance    Complete by:  As directed           Current Discharge Medication List    CONTINUE these medications which have NOT CHANGED   Details  fentaNYL (DURAGESIC - DOSED MCG/HR) 75 MCG/HR Place 75 mcg onto the skin every 3 (three) days.  Refills: 0    gabapentin (NEURONTIN) 100 MG capsule Take 100 mg by mouth at bedtime. Refills: 0    HYDROcodone-acetaminophen (NORCO/VICODIN) 5-325 MG per tablet Take 1 tablet by mouth daily.    insulin lispro (HUMALOG) 100 UNIT/ML injection Inject 0-6 Units into the skin 3 (three) times daily as needed for high blood sugar. Patient uses sliding scale    insulin NPH (HUMULIN N,NOVOLIN N) 100 UNIT/ML  injection Inject 30 Units into the skin every morning.     levothyroxine (SYNTHROID, LEVOTHROID) 88 MCG tablet Take 88 mcg by mouth daily.      LIVALO 4 MG TABS Take 4 mg by mouth every Monday, Wednesday, and Friday. 3 x a week    loperamide (IMODIUM) 2 MG capsule Take 2 mg by mouth daily.     memantine (NAMENDA) 10 MG tablet Take 1 tablet (10 mg total) by mouth 2 (two) times daily. Qty: 60 tablet, Refills: 11    Multiple Vitamin (MULTIVITAMIN) tablet Take 1 tablet by mouth daily.      predniSONE (DELTASONE) 5 MG tablet Take 5 mg by mouth daily.      telmisartan (MICARDIS) 40 MG tablet Take 1 tablet by mouth daily.    tiotropium (SPIRIVA HANDIHALER) 18 MCG inhalation capsule Place 1 capsule (18 mcg total) into inhaler and inhale daily. Qty: 90 capsule, Refills: 3       No Known Allergies Follow-up Information    Follow up with Morton Peters, MD.   Specialty:  Family Medicine   Why:  As needed   Contact information:   [place] [address] [phone]        The results of significant diagnostics from this hospitalization (including imaging, microbiology, ancillary and laboratory) are listed below for reference.    Significant Diagnostic Studies: Dg Chest 2 View  06/12/2015   CLINICAL DATA:  [place].  EXAM: CHEST - 2 VIEW; LEFT FOREARM - 2 VIEW; LEFT WRIST - COMPLETE 3+ VIEW  COMPARISON:  Chest x-ray 02/21/2014  FINDINGS: Chest x-ray:  The cardiac silhouette, mediastinal and hilar contours are within normal limits and stable. There is tortuosity and calcification of the thoracic aorta. The lungs are clear. No pleural effusion. The bony thorax is intact.  Left wrist:  The joint spaces are maintained. No acute fracture is identified. Extensive vascular calcifications are noted.  Left forearm:  The wrist and elbow joints are maintained. No acute forearm fracture is identified.  IMPRESSION: No acute cardiopulmonary findings.  No acute fracture involving the  left forearm or left wrist.   Electronically Signed   By: Marijo Sanes M.D.   On: 06/12/2015 19:05   Dg Forearm Left  06/12/2015   CLINICAL DATA:  [place].  EXAM: CHEST - 2 VIEW; LEFT FOREARM - 2 VIEW; LEFT WRIST - COMPLETE 3+ VIEW  COMPARISON:  Chest x-ray 02/21/2014  FINDINGS: Chest x-ray:  The cardiac silhouette, mediastinal and hilar contours are within normal limits and stable. There is tortuosity and calcification of the thoracic aorta. The lungs are clear. No pleural effusion. The bony thorax is intact.  Left wrist:  The joint spaces are maintained. No acute fracture is identified. Extensive vascular calcifications are noted.  Left forearm:  The wrist and elbow joints are maintained. No acute forearm fracture is identified.  IMPRESSION: No acute cardiopulmonary findings.  No acute fracture involving the left forearm or left wrist.   Electronically Signed   By: Marijo Sanes M.D.   On: 06/12/2015  19:05   Dg Wrist Complete Left  06/12/2015   CLINICAL DATA:  [place].  EXAM: CHEST - 2 VIEW; LEFT FOREARM - 2 VIEW; LEFT WRIST - COMPLETE 3+ VIEW  COMPARISON:  Chest x-ray 02/21/2014  FINDINGS: Chest x-ray:  The cardiac silhouette, mediastinal and hilar contours are within normal limits and stable. There is tortuosity and calcification of the thoracic aorta. The lungs are clear. No pleural effusion. The bony thorax is intact.  Left wrist:  The joint spaces are maintained. No acute fracture is identified. Extensive vascular calcifications are noted.  Left forearm:  The wrist and elbow joints are maintained. No acute forearm fracture is identified.  IMPRESSION: No acute cardiopulmonary findings.  No acute fracture involving the left forearm or left wrist.   Electronically Signed   By: Marijo Sanes M.D.   On: 06/12/2015 19:05   Ct Head Wo Contrast  06/12/2015   CLINICAL DATA:  Three-day history of progressive confusion  EXAM: CT HEAD WITHOUT CONTRAST  TECHNIQUE: Contiguous axial images were obtained from the base of  the skull through the vertex without intravenous contrast.  COMPARISON:  February 24, 2015  FINDINGS: There is mild diffuse atrophy. There is no intracranial mass, hemorrhage, extra-axial fluid collection, or midline shift. There is mild small vessel disease in the centra semiovale bilaterally. Elsewhere gray-white compartments appear normal. No acute infarct evident. Bony calvarium appears intact. The mastoid air cells are clear.  IMPRESSION: Atrophy with mild periventricular small vessel disease. No intracranial mass, hemorrhage, or acute appearing infarct.   Electronically Signed   By: Lowella Grip III M.D.   On: 06/12/2015 17:57   US Renal  06/13/2015   CLINICAL DATA:  Acute onset of renal insufficiency. Initial encounter.  EXAM: RENAL / URINARY TRACT ULTRASOUND COMPLETE  COMPARISON:  None.  FINDINGS: Right Kidney:  Length: 9.2 cm. Echogenicity within normal limits. No mass or hydronephrosis visualized.  Left Kidney:  Length: 9.2 cm. Echogenicity within normal limits. No mass or hydronephrosis visualized.  Bladder:  Appears normal for degree of bladder distention.  IMPRESSION: Unremarkable renal ultrasound.   Electronically Signed   By: Garald Balding M.D.   On: 06/13/2015 02:17   Dg Hand 2 View Left  06/12/2015   CLINICAL DATA:  Bruising left hand.  Unknown trauma status.  EXAM: LEFT HAND - 2 VIEW  COMPARISON:  None.  FINDINGS: Frontal and lateral views obtained. No demonstrable fracture or dislocation. There is mild narrowing of the first MCP as well as all PIP and DIP joints. No erosive change. There are multiple foci of arterial vascular calcification.  IMPRESSION: Osteoarthritic change in multiple joints. No erosive change. Foci of arterial vascular calcification. No acute fracture or dislocation.   Electronically Signed   By: Lowella Grip III M.D.   On: 06/12/2015 19:01    Microbiology: Recent Results (from the past 240 hour(s))  Urine culture     Status: None (Preliminary result)    Collection Time: 06/12/15  7:37 PM  Result Value Ref Range Status   Specimen Description URINE, CATHETERIZED  Final   Special Requests NONE  Final   Culture 40,000 COLONIES/ml ENTEROCOCCUS SPECIES  Final   Report Status PENDING  Incomplete     Labs: Basic Metabolic Panel:  Recent Labs Lab 06/12/15 1647 06/13/15 0350 06/14/15 0400 06/15/15 0518  NA 130* 133* 130* 134*  K 3.9 4.8 3.8 4.4  CL 95* 102 101 105  CO2 21* 19* 21* 20*  GLUCOSE 393* 284* 158* 243*  BUN 32* 31* 39* 24*  CREATININE 1.69* 1.61* 1.40* 1.02*  CALCIUM 8.6* 7.9* 7.7* 7.8*   Liver Function Tests:  Recent Labs Lab 06/12/15 1647 06/13/15 0350  AST 52* 35  ALT 23 19  ALKPHOS 55 45  BILITOT 0.8 1.1  PROT 5.7* 5.2*  ALBUMIN 3.4* 3.0*   No results for input(s): LIPASE, AMYLASE in the last 168 hours. No results for input(s): AMMONIA in the last 168 hours. CBC:  Recent Labs Lab 06/12/15 1647  06/13/15 0350 06/13/15 1040 06/13/15 1558 06/13/15 2157 06/14/15 0400  WBC 10.1  < > 8.8 8.6 8.2 7.5 5.3  NEUTROABS 8.7*  --   --   --   --   --   --   HGB 10.5*  < > 9.3* 8.8* 9.4* 8.8* 8.7*  HCT 31.9*  < > 27.8* 26.1* 27.8* 26.5* 25.5*  MCV 91.4  < > 90.8 91.6 90.8 90.1 89.8  PLT 228  < > 177 172 197 190 175  < > = values in this interval not displayed. Cardiac Enzymes: No results for input(s): CKTOTAL, CKMB, CKMBINDEX, TROPONINI in the last 168 hours. BNP: BNP (last 3 results) No results for input(s): BNP in the last 8760 hours.  ProBNP (last 3 results) No results for input(s): PROBNP in the last 8760 hours.  CBG:  Recent Labs Lab 06/14/15 0637 06/14/15 1135 06/14/15 1621 06/14/15 2139 06/15/15 0637  GLUCAP 162* 175* 256* 195* 274*       Signed:  Shaneta Cervenka L  [facility] 06/15/2015, 8:27 AM

## 2015-06-15 NOTE — Progress Notes (Signed)
Physical Therapy Treatment Patient Details Name: Michelle [name] MRN: [id number] DOB: [birth year] Today's Date: 06/15/2015    History of Present Illness TWANISHA FOULK is a 81 y.o. female with PMH of hypertension, COPD, asthma, hypothyroidism, PMR, memory loss, dementia,CKD-III, diabetes mellitus, who presents with nausea, vomiting, altered mental status and chronic diarrhea.    PT Comments    Patient improved with mobility today.  No dizziness reported.  Able to ambulate 74' with min assist and RW.  Patient to return home with 24 hour assist.  Recommend HHPT f/u for continued therapy.  Follow Up Recommendations  Supervision/Assistance - 24 hour;Home health PT     Equipment Recommendations  None recommended by PT    Recommendations for Other Services       Precautions / Restrictions Precautions Precautions: Fall Precaution Comments: H/O falls at home Restrictions Weight Bearing Restrictions: No    Mobility  Bed Mobility Overal bed mobility: Needs Assistance Bed Mobility: Supine to Sit     Supine to sit: Min assist     General bed mobility comments: Patient able to move to EOB with assist to scoot to EOB in sitting.  Sat to brush her teeth - able to maintain balance.  Did not note dyspnea with sitting.  Transfers Overall transfer level: Needs assistance Equipment used: Rolling walker (2 wheeled) Transfers: Sit to/from Stand Sit to Stand: Min guard         General transfer comment: Verbal cues for hand placement.  Assist for safety/balance during transfer.  Once upright, patient leaning forward to straighten pants legs over knee braces, and reports no dizziness with leaning forward.  Ambulation/Gait Ambulation/Gait assistance: Min assist Ambulation Distance (Feet): 42 Feet Assistive device: Rolling walker (2 wheeled) Gait Pattern/deviations: Step-through pattern;Decreased stride length;Shuffle;Trunk flexed Gait velocity: Decreased Gait velocity interpretation:  Below normal speed for age/gender General Gait Details: Assist to steady patient for balance during gait.  Patient able to maneuver RW.   Stairs            Wheelchair Mobility    Modified Rankin (Stroke Patients Only)       Balance           Standing balance support: Single extremity supported Standing balance-Leahy Scale: Poor Standing balance comment: Requires UE support to maintain balance in stance                    Cognition Arousal/Alertness: Awake/alert Behavior During Therapy: Anxious Overall Cognitive Status: History of cognitive impairments - at baseline Area of Impairment: Safety/judgement;Awareness;Memory;Following commands;Orientation Orientation Level: Disoriented to;Situation;Time   Memory: Decreased short-term memory Following Commands: Follows one step commands with increased time;Follows multi-step commands inconsistently Safety/Judgement: Decreased awareness of safety;Decreased awareness of deficits     General Comments: Patient focused on "they left me up in the chair all morning".  Patient was returned to bed just after 10.      Exercises Other Exercises Other Exercises: Reviewed x1 exercises both horizontally and vertically with patient and daughter.  Provided handouts for instruction.  Instructed patient to perform exercises if patient becomes dizzy again.    General Comments        Pertinent Vitals/Pain Pain Assessment: No/denies pain    Home Living                      Prior Function            PT Goals (current goals can now be found in the care plan section)  Progress towards PT goals: Progressing toward goals    Frequency  Min 3X/week    PT Plan Current plan remains appropriate    Co-evaluation             End of Session Equipment Utilized During Treatment: Gait belt Activity Tolerance: Patient limited by fatigue Patient left: in chair;with call bell/phone within reach;with nursing/sitter in  room (RN providing breathing medication)     Time: 2395-3202 PT Time Calculation (min) (ACUTE ONLY): 24 min  Charges:  $Gait Training: 8-22 mins $Therapeutic Exercise: 8-22 mins                    G Codes:      Despina Pole 2015-06-28, 4:25 PM Carita Pian. Sanjuana Kava, Gamaliel Pager [phone]

## 2015-06-15 NOTE — Progress Notes (Signed)
Upon initial assessment this am, pt was c/o a "tightness" in her lungs, with some difficulty breathing.  O2 sats at 100%, resp rate WNL, PRN Spiriva administered.  Pt reports relief.  MD notified, will monitor.

## 2015-06-15 NOTE — Progress Notes (Signed)
Occupational Therapy Treatment Patient Details Name: Michelle [name] MRN: [id number] DOB: December 31, 1931 Today's Date: 06/15/2015    History of present illness Michelle [name] is a 81 y.o. female with PMH of hypertension, COPD, asthma, hypothyroidism, PMR, memory loss, dementia,CKD-III, diabetes mellitus, who presents with nausea, vomiting, altered mental status and chronic diarrhea.   OT comments  Tx. Session limited this am secondary to pt. C/o SOB and "tightness" in chest.  RN notified, vitals checked pt. At 100% o2 on room air but symptoms/complaints remain.  Rx. For inhaler and rn provided a dose for pt.  Pt. States dizziness improved today but now breathing concerns present.  Able to complete bed mobility and stand pivot to recliner with S. Will continue to follow acutely.  Note dtr. Plans to take home and can provide 24/7.  Follow Up Recommendations  Home health OT;Supervision/Assistance - 24 hour    Equipment Recommendations       Recommendations for Other Services      Precautions / Restrictions Precautions Precautions: Fall Precaution Comments: Patient states the last time she fell was a year ago.  Daughter reports 3-6 months ago in her house.  Patient has bruise on Rt frontal area of head that looks fairly recent - states she fell into the doorway when asked.       Mobility Bed Mobility Overal bed mobility: Needs Assistance Bed Mobility: Rolling;Sidelying to Sit Rolling: Supervision Sidelying to sit: Supervision          Transfers Overall transfer level: Needs assistance Equipment used: Rolling walker (2 wheeled) Transfers: Sit to/from [facility] Sit to Stand: Min guard Stand pivot transfers: Supervision            Balance                                   ADL Overall ADL's : Needs assistance/impaired                         Toilet Transfer: Supervision/safety;Stand-pivot;RW Armed forces technical officer Details (indicate cue type  and reason): simulated during stand pivot transfer eob to recliner Toileting- Clothing Manipulation and Hygiene: Supervision/safety;Sit to/from stand Toileting - Clothing Manipulation Details (indicate cue type and reason): simulated during transfer     Functional mobility during ADLs: Supervision/safety General ADL Comments: session limited today as pt. c/o SOB.  vitals taken and pt. at 100% but remains with c/o "tighness"  rn notified.  pt. states dizziness is improved today but now breathing issues limiting particiption      Vision                     Perception     Praxis      Cognition   Behavior During Therapy: [facility] for tasks assessed/performed;Flat affect Overall Cognitive Status: History of cognitive impairments - at baseline                       Extremity/Trunk Assessment               Exercises     Shoulder Instructions       General Comments      Pertinent Vitals/ Pain       Pain Assessment:  (no pain just SOB rn notified)  Home Living  Prior Functioning/Environment              Frequency       Progress Toward Goals  OT Goals(current goals can now be found in the care plan section)  Progress towards OT goals: Progressing toward goals     Plan Discharge plan remains appropriate    Co-evaluation                 End of Session Equipment Utilized During Treatment: Rolling walker   Activity Tolerance Other (comment) (limited participation secondary to c/o of SOB)   Patient Left in chair;with call bell/phone within reach;with chair alarm set;with nursing/sitter in room   Nurse Communication  (pt. c/o SOB rn notified and checks vitals)        Time: 2174-7159 OT Time Calculation (min): 18 min  Charges: OT General Charges $OT Visit: 1 Procedure OT Treatments $Self Care/Home Management : 8-22 mins  Janice Coffin, COTA/L 06/15/2015, 7:55  AM

## 2015-06-15 NOTE — Care Management Note (Signed)
Case Management Note  Patient Details  Name: Michelle [name] MRN: [id number] Date of Birth: Mar 17, 1932  Subjective/Objective:    81 y.o F who resided with Daughter prior to admission. PT recommended to be discharged to SNF, family declined. Will be discharged Home with HHPT/OT provided by [facility]. ([facility] Rep) Aware)                Action/Plan: No further discharge needs .    Expected Discharge Date:                  Expected Discharge Plan:  [place]  In-House Referral:     Discharge planning Services  CM Consult  Post Acute Care Choice:    Choice offered to:  Patient, Adult Children (Daughters at bedside)  DME Arranged:    DME Agency:     HH Arranged:  PT, OT HH Agency:  [facility]  Status of Service:  Completed, signed off  Medicare Important Message Given:    Date Medicare IM Given:    Medicare IM give by:    Date Additional Medicare IM Given:    Additional Medicare Important Message give by:     If discussed at [place] of Stay Meetings, dates discussed:    Additional Comments:  Delrae Sawyers, RN 06/15/2015, 1:58 PM

## 2015-06-18 LAB — GI PATHOGEN PANEL BY PCR, STOOL

## 2015-07-03 ENCOUNTER — Ambulatory Visit (INDEPENDENT_AMBULATORY_CARE_PROVIDER_SITE_OTHER): Payer: Medicare Other | Admitting: Family Medicine

## 2015-07-03 ENCOUNTER — Encounter: Payer: Self-pay | Admitting: Family Medicine

## 2015-07-03 VITALS — BP 152/78 | HR 64 | Temp 98.8°F | Resp 14 | Ht 61.5 in | Wt 138.0 lb

## 2015-07-03 DIAGNOSIS — F05 Delirium due to known physiological condition: Secondary | ICD-10-CM

## 2015-07-03 DIAGNOSIS — R410 Disorientation, unspecified: Secondary | ICD-10-CM

## 2015-07-03 DIAGNOSIS — IMO0001 Reserved for inherently not codable concepts without codable children: Secondary | ICD-10-CM

## 2015-07-03 DIAGNOSIS — E109 Type 1 diabetes mellitus without complications: Secondary | ICD-10-CM

## 2015-07-03 DIAGNOSIS — Z794 Long term (current) use of insulin: Principal | ICD-10-CM

## 2015-07-03 DIAGNOSIS — Z7189 Other specified counseling: Secondary | ICD-10-CM

## 2015-07-03 DIAGNOSIS — R413 Other amnesia: Secondary | ICD-10-CM

## 2015-07-03 DIAGNOSIS — Z7689 Persons encountering health services in other specified circumstances: Secondary | ICD-10-CM

## 2015-07-03 DIAGNOSIS — E119 Type 2 diabetes mellitus without complications: Principal | ICD-10-CM

## 2015-07-03 LAB — CBC WITH DIFFERENTIAL/PLATELET
BASOS ABS: 0 10*3/uL (ref 0.0–0.1)
Basophils Relative: 0 % (ref 0–1)
EOS ABS: 0 10*3/uL (ref 0.0–0.7)
EOS PCT: 0 % (ref 0–5)
HEMATOCRIT: 33 % — AB (ref 36.0–46.0)
Hemoglobin: 10.8 g/dL — ABNORMAL LOW (ref 12.0–15.0)
Lymphocytes Relative: 18 % (ref 12–46)
Lymphs Abs: 0.8 10*3/uL (ref 0.7–4.0)
MCH: 30.3 pg (ref 26.0–34.0)
MCHC: 32.7 g/dL (ref 30.0–36.0)
MCV: 92.4 fL (ref 78.0–100.0)
MPV: 10.2 fL (ref 8.6–12.4)
Monocytes Absolute: 0.3 10*3/uL (ref 0.1–1.0)
Monocytes Relative: 7 % (ref 3–12)
Neutro Abs: 3.5 10*3/uL (ref 1.7–7.7)
Neutrophils Relative %: 75 % (ref 43–77)
Platelets: 309 10*3/uL (ref 150–400)
RBC: 3.57 MIL/uL — AB (ref 3.87–5.11)
RDW: 14.3 % (ref 11.5–15.5)
WBC: 4.6 10*3/uL (ref 4.0–10.5)

## 2015-07-03 NOTE — Progress Notes (Signed)
Subjective:    Patient ID: Michelle [name], female    DOB: Apr 08, 1932, 81 y.o.   MRN: [id number]  HPI Patient is here today accompanied by her daughter to establish care. Daughter states that the patient has had mild memory problems for quite a long time. However they were mild. Recently, the patient was found unresponsive in her home with vomiting and diarrhea. Sugar was found to be in the 70s. She was taken to the emergency room where head CT was normal except for age-related atrophy. Patient was diagnosed with file gastroenteritis and dehydration and was discharged home after IV fluid rehydration. However the daughter states that ever since discharge from the emergency room, the patient has been more delirious and more confused than her baseline. Past medical history is significant for asthma, polymyalgia rheumatica and severe joint pains requiring daily prednisone as well as fentanyl patches. She also has a history of hypothyroidism, memory loss, insulin-dependent diabetes mellitus, and coronary artery disease with mild chronic kidney disease. She is not currently taking an aspirin. Past Medical History  Diagnosis Date  . Type II or unspecified type diabetes mellitus without mention of complication, not stated as uncontrolled   . Dyslipidemia   . Asthma   . Arthritis   . Gallstones   . COPD (chronic obstructive pulmonary disease)   . Interstitial cystitis   . UTI (lower urinary tract infection)   . Malaise and fatigue   . Memory loss   . Coronary atherosclerosis   . Systemic hypertension   . Polymyalgia rheumatica   . Hypothyroidism    Past Surgical History  Procedure Laterality Date  . Lumbar disc surgery    . Total abdominal hysterectomy    . Rotator cuff repair    . Tonsillectomy    . Cervical spine surgery    . Cholecystectomy    . Hernia repair    . Cardiac catheterization  01/29/2006    10% luminal irregularity mid LAD  . Cardiac catheterization  03/15/2001    No  significant CAD, no evidence of renal artery stenosis, systemic hypertenion  . US echocardiography  09/14/2007    mild LVH, LA mildly dilated,mild mitral annular calcification, AOV mildly sclerotic, aortic root sclerosis/ca+  . Nm myocar perf wall motion  08/17/2009    normal   Current Outpatient Prescriptions on File Prior to Visit  Medication Sig Dispense Refill  . fentaNYL (DURAGESIC - DOSED MCG/HR) 75 MCG/HR Place 75 mcg onto the skin every 3 (three) days. Rheumotologist RX's  0  . gabapentin (NEURONTIN) 100 MG capsule Take 100 mg by mouth at bedtime.  0  . HYDROcodone-acetaminophen (NORCO/VICODIN) 5-325 MG per tablet Take 1 tablet by mouth daily.    . insulin lispro (HUMALOG) 100 UNIT/ML injection Inject 0-6 Units into the skin 3 (three) times daily as needed for high blood sugar. Patient uses sliding scale    . insulin NPH (HUMULIN N,NOVOLIN N) 100 UNIT/ML injection Inject 30 Units into the skin every morning.     Marland Kitchen levothyroxine (SYNTHROID, LEVOTHROID) 88 MCG tablet Take 88 mcg by mouth daily.      Marland Kitchen LIVALO 4 MG TABS Take 4 mg by mouth every Monday, Wednesday, and Friday. 3 x a week    . loperamide (IMODIUM) 2 MG capsule Take 2 mg by mouth daily.     . memantine (NAMENDA) 10 MG tablet Take 1 tablet (10 mg total) by mouth 2 (two) times daily. 60 tablet 11  . Multiple Vitamin (MULTIVITAMIN) tablet  Take 1 tablet by mouth daily.      . predniSONE (DELTASONE) 5 MG tablet Take 5 mg by mouth daily.      Marland Kitchen telmisartan (MICARDIS) 40 MG tablet Take 1 tablet by mouth daily.    Marland Kitchen tiotropium (SPIRIVA HANDIHALER) 18 MCG inhalation capsule Place 1 capsule (18 mcg total) into inhaler and inhale daily. 90 capsule 3   No current facility-administered medications on file prior to visit.   No Known Allergies Social History   Social History  . Marital Status: Widowed    Spouse Name: N/A  . Number of Children: 4  . Years of Education: 12   Occupational History  . retired    Social History Main  Topics  . Smoking status: Never Smoker   . Smokeless tobacco: Never Used  . Alcohol Use: No  . Drug Use: No  . Sexual Activity: Not on file   Other Topics Concern  . Not on file   Social History Narrative   Patient lives at home alone. Widowed.   Retired.   Education High school   Right handed   Caffeine- some times coffee and soda one cup.      Review of Systems  All other systems reviewed and are negative.      Objective:   Physical Exam  Constitutional: She is oriented to person, place, and time. She appears well-developed and well-nourished.  Neck: Neck supple. No JVD present. No thyromegaly present.  Cardiovascular: Normal rate and regular rhythm.   Murmur heard. Pulmonary/Chest: Effort normal and breath sounds normal. No respiratory distress. She has no wheezes. She has no rales. She exhibits no tenderness.  Abdominal: Soft. Bowel sounds are normal. She exhibits no distension and no mass. There is no tenderness. There is no rebound and no guarding.  Lymphadenopathy:    She has no cervical adenopathy.  Neurological: She is alert and oriented to person, place, and time. She has normal reflexes. She displays normal reflexes. No cranial nerve deficit. She exhibits normal muscle tone. Coordination normal.          Assessment & Plan:  Diabetes mellitus, insulin dependent (IDDM), controlled - Plan: CBC with Differential/Platelet, COMPLETE METABOLIC PANEL WITH GFR, Hemoglobin A1c  Subacute delirium - Plan: TSH, Vitamin B12  Memory loss of unknown cause - Plan: Vitamin B12  Establishing care with new doctor, encounter for  Patient has a history of dementia. It is possible that her confusion stems from her recent hospital visit, and dehydration causing delirium which is slow to resolve. However I'm concerned the patient may have had a small stroke as the family states that she was having a difficult time finding her words and was slurring her speech slightly. I'm also  concerned that it may have been a postictal phase after a seizure from hypoglycemia. Given her dementia and her age, I would like to keep the patient on his lower dose of insulin is possible. Therefore I will obtain an MRI of the brain to rule out stroke. If there is a stroke, we will need to work on risk factor stratification to prevent secondary stroke. I did recommend the patient start an aspirin on a daily basis 81 mg given her history of coronary artery disease and diabetes for cardiovascular protection. I'm also concerned that the patient may have had delirium due to the narcotics she is taking for polymyalgia rheumatica coupled with dehydration and her dementia. We may need to consider trying to titrate her narcotic pain medication down.  I would also like to check a TSH, vitamin B12, CBC, CMP, and hemoglobin A1c. If I am able to keep her hemoglobin A1c less than 8 without insulin I will discontinue the insulin.

## 2015-07-04 LAB — COMPLETE METABOLIC PANEL WITH GFR
ALT: 10 U/L (ref 6–29)
AST: 22 U/L (ref 10–35)
Albumin: 4 g/dL (ref 3.6–5.1)
Alkaline Phosphatase: 57 U/L (ref 33–130)
BUN: 16 mg/dL (ref 7–25)
CALCIUM: 9 mg/dL (ref 8.6–10.4)
CO2: 24 mmol/L (ref 20–31)
Chloride: 96 mmol/L — ABNORMAL LOW (ref 98–110)
Creat: 0.97 mg/dL — ABNORMAL HIGH (ref 0.60–0.88)
GFR, EST AFRICAN AMERICAN: 64 mL/min (ref >=60)
GFR, Est Non African American: 55 mL/min — ABNORMAL LOW (ref >=60)
Glucose, Bld: 179 mg/dL — ABNORMAL HIGH (ref 70–99)
POTASSIUM: 4.6 mmol/L (ref 3.5–5.3)
Sodium: 139 mmol/L (ref 135–146)
Total Bilirubin: 0.5 mg/dL (ref 0.2–1.2)
Total Protein: 6.3 g/dL (ref 6.1–8.1)

## 2015-07-04 LAB — HEMOGLOBIN A1C
HEMOGLOBIN A1C: 7.6 % — AB (ref <5.7)
MEAN PLASMA GLUCOSE: 171 mg/dL — AB (ref <117)

## 2015-07-04 LAB — VITAMIN B12: VITAMIN B 12: 1976 pg/mL — AB (ref 211–911)

## 2015-07-04 LAB — TSH: TSH: 6.637 u[IU]/mL — ABNORMAL HIGH (ref 0.350–4.500)

## 2015-07-09 ENCOUNTER — Other Ambulatory Visit: Payer: Self-pay | Admitting: Family Medicine

## 2015-07-09 DIAGNOSIS — E039 Hypothyroidism, unspecified: Secondary | ICD-10-CM

## 2015-07-09 DIAGNOSIS — Z79899 Other long term (current) drug therapy: Secondary | ICD-10-CM

## 2015-07-09 MED ORDER — LEVOTHYROXINE SODIUM 100 MCG PO TABS: 100.0000 ug | ORAL_TABLET | Freq: Every day | ORAL | Status: DC

## 2015-07-10 DIAGNOSIS — M159 Polyosteoarthritis, unspecified: Secondary | ICD-10-CM | POA: Diagnosis not present

## 2015-07-10 DIAGNOSIS — F0390 Unspecified dementia without behavioral disturbance: Secondary | ICD-10-CM | POA: Diagnosis not present

## 2015-07-10 DIAGNOSIS — M353 Polymyalgia rheumatica: Secondary | ICD-10-CM | POA: Diagnosis not present

## 2015-07-10 DIAGNOSIS — Z79891 Long term (current) use of opiate analgesic: Secondary | ICD-10-CM | POA: Diagnosis not present

## 2015-07-18 ENCOUNTER — Ambulatory Visit
Admission: RE | Admit: 2015-07-18 | Discharge: 2015-07-18 | Disposition: A | Discharge status: Home / Self Care | Payer: Medicare Other | Source: Ambulatory Visit | Attending: Family Medicine | Admitting: Family Medicine

## 2015-07-18 DIAGNOSIS — R410 Disorientation, unspecified: Secondary | ICD-10-CM

## 2015-07-18 DIAGNOSIS — R413 Other amnesia: Secondary | ICD-10-CM | POA: Diagnosis not present

## 2015-07-18 DIAGNOSIS — F05 Delirium due to known physiological condition: Principal | ICD-10-CM

## 2015-07-18 MED ORDER — GADOBENATE DIMEGLUMINE 529 MG/ML IV SOLN
13.0000 mL | Freq: Once | INTRAVENOUS | Status: AC | PRN
  Administered 2015-07-18: 13 mL via INTRAVENOUS

## 2015-08-06 DIAGNOSIS — S82002A Unspecified fracture of left patella, initial encounter for closed fracture: Secondary | ICD-10-CM | POA: Diagnosis not present

## 2015-08-07 ENCOUNTER — Ambulatory Visit: Payer: Medicare Other | Admitting: Neurology

## 2015-08-13 ENCOUNTER — Ambulatory Visit (INDEPENDENT_AMBULATORY_CARE_PROVIDER_SITE_OTHER): Payer: Medicare Other | Admitting: Ophthalmology

## 2015-08-14 DIAGNOSIS — S82002D Unspecified fracture of left patella, subsequent encounter for closed fracture with routine healing: Secondary | ICD-10-CM | POA: Diagnosis not present

## 2015-08-21 DIAGNOSIS — S82002D Unspecified fracture of left patella, subsequent encounter for closed fracture with routine healing: Secondary | ICD-10-CM | POA: Diagnosis not present

## 2015-09-06 ENCOUNTER — Other Ambulatory Visit: Payer: Medicare Other

## 2015-09-06 DIAGNOSIS — Z79899 Other long term (current) drug therapy: Secondary | ICD-10-CM

## 2015-09-06 DIAGNOSIS — E039 Hypothyroidism, unspecified: Secondary | ICD-10-CM

## 2015-09-07 ENCOUNTER — Encounter: Payer: Self-pay | Admitting: Family Medicine

## 2015-09-07 LAB — TSH: TSH: 2.389 u[IU]/mL (ref 0.350–4.500)

## 2015-09-10 DIAGNOSIS — S82002D Unspecified fracture of left patella, subsequent encounter for closed fracture with routine healing: Secondary | ICD-10-CM | POA: Diagnosis not present

## 2015-09-11 ENCOUNTER — Ambulatory Visit (INDEPENDENT_AMBULATORY_CARE_PROVIDER_SITE_OTHER): Payer: Medicare Other | Admitting: Neurology

## 2015-09-11 ENCOUNTER — Encounter: Payer: Self-pay | Admitting: Neurology

## 2015-09-11 VITALS — BP 138/68 | HR 56

## 2015-09-11 DIAGNOSIS — F0391 Unspecified dementia with behavioral disturbance: Secondary | ICD-10-CM

## 2015-09-11 DIAGNOSIS — M545 Low back pain, unspecified: Secondary | ICD-10-CM | POA: Insufficient documentation

## 2015-09-11 DIAGNOSIS — R269 Unspecified abnormalities of gait and mobility: Secondary | ICD-10-CM | POA: Diagnosis not present

## 2015-09-11 DIAGNOSIS — G8929 Other chronic pain: Secondary | ICD-10-CM | POA: Diagnosis not present

## 2015-09-11 DIAGNOSIS — F03918 Unspecified dementia, unspecified severity, with other behavioral disturbance: Secondary | ICD-10-CM

## 2015-09-11 MED ORDER — QUETIAPINE FUMARATE 25 MG PO TABS
ORAL_TABLET | ORAL | Status: DC
Start: 2015-09-11 — End: 2015-12-12

## 2015-09-11 NOTE — Progress Notes (Signed)
Chief Complaint  Patient presents with  . Memory Loss    MMSE - animals.  She is here with her daughters, Michelle [name] and Michelle [name], who both feels her memory and confusion has become much worse.  They would like to review her recent brain MRI.      PATIENT: Michelle [name] DOB: December 19, 1931  HISTORICAL  Michelle [name] 82yo RH   HPI: Patient returns for followup for her mild memory trouble.  She has past medical history of diabetes, insulin-dependent, hypertension, hyperlipidemia, polymyalgia rheumatica, she also had a history of right knee replacement 02/12/10, lumbar, cervical decompression surgery in the past.   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  MRI scan of the brain showed moderate degree of global cerebral atrophy and mild degree of changes of chronic microvascular ischemia.   Her maternal aunts suffered dementia,  her mother died of breast cancer at age 66, her father died of coronary artery disease at age 20.  Her paternal uncle, aunt suffered dementia,  She also reported a history of bradycardia, is followed by cardiologist, there was a period of time, pacemaker placement was considered, she is tolerating Aricept 5 mg, today's heart rate is 32s, after discussed with patient, we stopped Aricept, Michelle [name] was started since July 2014,   UPDATE Jan 15th 2015; She took Namenda for about 6 months, did not notice  any significant difference, She has run out of namenda because of donut hole, she is less active because of bilateral hip and knee pain. She is using Fentanyl patch 51mcg q week, instead of q3 days, it made her too sleepy.  UPDATE March 24th 2016; She lives alone, drives herself to clinic today, Mini-Mental Status Examination is 57 out of 30, she is not oriented to date, has difficulty spell world backwards She is no longer taking Namenda because the high co-pay in the past, she complains worsening low back pain, had a previous lumbar, cervical decompression surgery by Dr. Saintclair Halsted, 3 epidural injection in 12-Feb-2014, most recent one was May 2015, failed to improve her symptoms, she is now using fentanyl patch 75 g, mild urinary incontinence, wearing pads, using a cane,  UPDATE Nov 2016: She is accompanied by her 2 daughters at today's clinical visit, she fell in August 20 fifth 2016, broke her left knee, is in left leg splint, daughter also reported gradual decline of functional status, memory trouble, agitations, she sometimes got confused "want to go home", while she lived in the same house since 64s.  We have reviewed MRI of the brain September 2016, evidence of moderate generalized atrophy, especially at  perisylvarian fissure, mild small vessel disease.  Her family is helping managing her medications now, she still does insulin shots herself, but the dosage was measured by her daughter, she is also taking fentanyl patch for her left knee pain, chronic low back pain.  REVIEW OF SYSTEMS: Full 14 system review of systems performed and notable  only for cold intolerance, bruise easily, confusion, joint pain, low back pain, daytime sleepiness, wheezing, shortness of breath, murmur, drooling  ALLERGIES: No Known Allergies  HOME MEDICATIONS: Current Outpatient Prescriptions  Medication Sig Dispense Refill  . fentaNYL (DURAGESIC - DOSED MCG/HR) 50 MCG/HR Place 50 mcg onto the skin every 3 (three) days.      Marland Kitchen gabapentin (NEURONTIN) 100 MG capsule Take 100 mg by mouth at bedtime.  0  . HYDROcodone-acetaminophen (NORCO/VICODIN) 5-325 MG per tablet Take 1 tablet by mouth as needed.     . insulin lispro (HUMALOG) 100 UNIT/ML injection Inject 0-6 Units into the skin 3 (three) times daily as needed for high blood sugar. Patient uses sliding scale    . insulin NPH (HUMULIN N,NOVOLIN N) 100 UNIT/ML injection Inject 20 Units into the skin every morning.     Marland Kitchen levothyroxine (SYNTHROID, LEVOTHROID) 100 MCG tablet Take 1 tablet (100 mcg total) by mouth daily. 90 tablet 1  . LIVALO 4 MG TABS Take 4 mg by mouth every Monday, Wednesday, and Friday. 3 x a week    . loperamide (IMODIUM) 2 MG capsule Take 2 mg by mouth daily.     . memantine (NAMENDA) 10 MG tablet Take 1 tablet (10 mg total) by mouth 2 (two) times daily. 60 tablet 11  . Multiple Vitamin (MULTIVITAMIN) tablet Take 1 tablet by mouth daily.      . ONE TOUCH ULTRA TEST test strip TEST BS 5-6 TIMES A DAY  6  . predniSONE (DELTASONE) 1 MG tablet Take 4 mg by mouth daily.    Marland Kitchen telmisartan (MICARDIS) 40 MG tablet Take 1 tablet by mouth daily.    Marland Kitchen tiotropium (SPIRIVA HANDIHALER) 18 MCG inhalation capsule Place 1 capsule (18 mcg total) into inhaler and inhale daily. 90 capsule 3   No current facility-administered medications for this visit.    PAST MEDICAL HISTORY: Past Medical History  Diagnosis Date  . Type II or unspecified type diabetes mellitus without mention of complication, not stated as uncontrolled   . Dyslipidemia   . Asthma   . Arthritis   . Gallstones   . COPD (chronic obstructive pulmonary disease) ([place])   . Interstitial cystitis   . UTI (lower urinary tract infection)   . Malaise and fatigue   . Memory loss   . Coronary atherosclerosis   . Systemic hypertension   . Polymyalgia rheumatica ([place])   . Hypothyroidism   . History of fractured kneecap     PAST SURGICAL HISTORY: Past Surgical History  Procedure Laterality Date  .  Lumbar disc surgery    . Total abdominal hysterectomy    . Rotator cuff repair    . Tonsillectomy    . Cervical spine surgery    . Cholecystectomy    . Hernia repair    . Cardiac catheterization  01/29/2006    10% luminal irregularity mid LAD  . Cardiac catheterization  03/15/2001    No significant CAD, no evidence of renal artery stenosis, systemic hypertenion  . US echocardiography  09/14/2007    mild LVH, LA mildly dilated,mild mitral annular calcification, AOV mildly sclerotic, aortic root sclerosis/ca+  . Nm myocar perf wall motion  08/17/2009    normal    FAMILY HISTORY: Family History  Problem Relation Age of Onset  . Breast cancer Mother   . Heart disease Father   . Lung cancer Brother     SOCIAL HISTORY:  Social History   Social History  . Marital Status: Widowed  Spouse Name: N/A  . Number of Children: 4  . Years of Education: 12   Occupational History  . retired    Social History Main Topics  . Smoking status: Never Smoker   . Smokeless tobacco: Never Used  . Alcohol Use: No  . Drug Use: No  . Sexual Activity: Not on file   Other Topics Concern  . Not on file   Social History Narrative   Patient lives at home alone. Widowed.   Retired.   Education High school   Right handed   Caffeine- some times coffee and soda one cup.     PHYSICAL EXAM   Filed Vitals:   09/11/15 1610  BP: 138/68  [name]: 56    Not recorded      There is no weight on file to calculate BMI.  PHYSICAL EXAMNIATION:  Gen: NAD, conversant, well nourised, obese, well groomed                     Cardiovascular: Regular rate rhythm, no peripheral edema, warm, nontender. Eyes: Conjunctivae clear without exudates or hemorrhage Neck: Supple, no carotid bruise. Pulmonary: Clear to auscultation bilaterally   NEUROLOGICAL EXAM:  MENTAL STATUS: Speech:    Speech is normal; fluent and spontaneous with normal comprehension.  Cognition: Mini-Mental Status Examination is 28 out  of 30, animal naming is 20     Orientation to time, place and person, she is not oriented to year     Recent and remote memory: She missed one out of 3 recalls     Normal Attention span and concentration     Normal Language, naming, repeating,spontaneous speech     Fund of knowledge    CRANIAL NERVES: CN II: Visual fields are full to confrontation.  Pupils are 4 mm and briskly reactive to light. Visual acuity is 20/20 bilaterally. CN III, IV, VI: extraocular movement are normal. No ptosis. CN V: Facial sensation is intact to pinprick in all 3 divisions bilaterally. Corneal responses are intact.  CN VII: Face is symmetric with normal eye closure and smile. CN VIII: Hearing is normal to rubbing fingers CN IX, X: Palate elevates symmetrically. Phonation is normal. CN XI: Head turning and shoulder shrug are intact CN XII: Tongue is midline with normal movements and no atrophy.  MOTOR: There is no pronator drift of out-stretched arms. Muscle bulk and tone are normal. Muscle strength is normal, left lower extremity muscle strength is limited because left knee brace   REFLEXES: Present and symmetric.  SENSORY: Light touch, pinprick, position sense, and vibration sense are intact in fingers and toes.  COORDINATION: Rapid alternating movements and fine finger movements are intact. There is no dysmetria on finger-to-nose  GAIT/STANCE: deferred   DIAGNOSTIC DATA (LABS, IMAGING, TESTING) - I reviewed patient records, labs, notes, testing and imaging myself where available.   ASSESSMENT AND PLAN  KINNLEY PAULSON is a 81 y.o. female with gradual onset memory trouble, today's Mini-Mental status examination is 28 out of 30, she also complains of chronic low back pain, gait difficulty,  Mild cognitive impairment  Continue Namenda 10 mg twice a day   She is interested in mild dementia trial  I have add on seroquel 25 mg half to one tablets every night for evening time agitations  Chronic  low back pain,    likely lumbar radiculopathy  moderate exercise, pain management   Michelle [name], M.D. Ph.D.  [facility] [address], [place], [place]  [address] Ph: [phone] Fax: [phone]

## 2015-09-17 ENCOUNTER — Ambulatory Visit (INDEPENDENT_AMBULATORY_CARE_PROVIDER_SITE_OTHER): Payer: Medicare Other | Admitting: Ophthalmology

## 2015-09-17 DIAGNOSIS — H353111 Nonexudative age-related macular degeneration, right eye, early dry stage: Secondary | ICD-10-CM

## 2015-09-17 DIAGNOSIS — H353124 Nonexudative age-related macular degeneration, left eye, advanced atrophic with subfoveal involvement: Secondary | ICD-10-CM | POA: Diagnosis not present

## 2015-09-17 DIAGNOSIS — H43813 Vitreous degeneration, bilateral: Secondary | ICD-10-CM

## 2015-09-17 DIAGNOSIS — E11311 Type 2 diabetes mellitus with unspecified diabetic retinopathy with macular edema: Secondary | ICD-10-CM | POA: Diagnosis not present

## 2015-09-17 DIAGNOSIS — D3132 Benign neoplasm of left choroid: Secondary | ICD-10-CM | POA: Diagnosis not present

## 2015-09-17 DIAGNOSIS — E113292 Type 2 diabetes mellitus with mild nonproliferative diabetic retinopathy without macular edema, left eye: Secondary | ICD-10-CM | POA: Diagnosis not present

## 2015-09-17 DIAGNOSIS — E113211 Type 2 diabetes mellitus with mild nonproliferative diabetic retinopathy with macular edema, right eye: Secondary | ICD-10-CM | POA: Diagnosis not present

## 2015-09-17 DIAGNOSIS — I10 Essential (primary) hypertension: Secondary | ICD-10-CM

## 2015-09-17 DIAGNOSIS — H35033 Hypertensive retinopathy, bilateral: Secondary | ICD-10-CM

## 2015-09-19 DIAGNOSIS — M545 Low back pain: Secondary | ICD-10-CM | POA: Diagnosis not present

## 2015-09-19 DIAGNOSIS — Z79899 Other long term (current) drug therapy: Secondary | ICD-10-CM | POA: Diagnosis not present

## 2015-09-19 DIAGNOSIS — G894 Chronic pain syndrome: Secondary | ICD-10-CM | POA: Diagnosis not present

## 2015-09-27 ENCOUNTER — Telehealth: Payer: Self-pay

## 2015-09-27 DIAGNOSIS — S82002D Unspecified fracture of left patella, subsequent encounter for closed fracture with routine healing: Secondary | ICD-10-CM | POA: Diagnosis not present

## 2015-09-27 DIAGNOSIS — E119 Type 2 diabetes mellitus without complications: Secondary | ICD-10-CM | POA: Diagnosis not present

## 2015-09-27 DIAGNOSIS — Z794 Long term (current) use of insulin: Secondary | ICD-10-CM | POA: Diagnosis not present

## 2015-09-27 DIAGNOSIS — Z9181 History of falling: Secondary | ICD-10-CM | POA: Diagnosis not present

## 2015-09-27 DIAGNOSIS — W19XXXD Unspecified fall, subsequent encounter: Secondary | ICD-10-CM | POA: Diagnosis not present

## 2015-09-27 NOTE — Telephone Encounter (Signed)
I left a message for the patient's daughter, Juliann Pulse, to return my call. I also contacted Jackelyn Poling, the patient's other daughter, and she asked me to contact Juliann Pulse for research purposes. I will attempt to contact Juliann Pulse again in the future.

## 2015-10-01 ENCOUNTER — Telehealth: Payer: Self-pay

## 2015-10-01 NOTE — Telephone Encounter (Signed)
I spoke to the patient's daughter, Michelle [name], in regards to the CREAD study. They both are interested in participating. Therefore, the FCSRT visit has been scheduled for [id number] at 13:00h.

## 2015-10-08 DIAGNOSIS — M353 Polymyalgia rheumatica: Secondary | ICD-10-CM | POA: Diagnosis not present

## 2015-10-09 DIAGNOSIS — S82002D Unspecified fracture of left patella, subsequent encounter for closed fracture with routine healing: Secondary | ICD-10-CM | POA: Diagnosis not present

## 2015-10-15 ENCOUNTER — Telehealth: Payer: Self-pay

## 2015-10-15 NOTE — Telephone Encounter (Signed)
I left a message for the patient to return my call.

## 2015-10-16 ENCOUNTER — Telehealth: Payer: Self-pay

## 2015-10-16 NOTE — Telephone Encounter (Signed)
I spoke to the patient's daughter, Juliann Pulse, in regards to the CREAD study. I rescheduled the memory loss test (FCSRT) from [id number] to [id number] at [place] agreed and voiced understanding.

## 2015-10-17 DIAGNOSIS — M79606 Pain in leg, unspecified: Secondary | ICD-10-CM | POA: Diagnosis not present

## 2015-10-17 DIAGNOSIS — M545 Low back pain: Secondary | ICD-10-CM | POA: Diagnosis not present

## 2015-10-17 DIAGNOSIS — G894 Chronic pain syndrome: Secondary | ICD-10-CM | POA: Diagnosis not present

## 2015-10-17 DIAGNOSIS — Z79899 Other long term (current) drug therapy: Secondary | ICD-10-CM | POA: Diagnosis not present

## 2015-10-26 ENCOUNTER — Ambulatory Visit
Admission: RE | Admit: 2015-10-26 | Discharge: 2015-10-26 | Disposition: A | Discharge status: Home / Self Care | Payer: Medicare Other | Source: Ambulatory Visit | Attending: Anesthesiology | Admitting: Anesthesiology

## 2015-10-26 ENCOUNTER — Other Ambulatory Visit: Payer: Self-pay | Admitting: Anesthesiology

## 2015-10-26 DIAGNOSIS — M545 Low back pain: Secondary | ICD-10-CM

## 2015-10-26 DIAGNOSIS — M5136 Other intervertebral disc degeneration, lumbar region: Secondary | ICD-10-CM | POA: Diagnosis not present

## 2015-11-06 DIAGNOSIS — S82002D Unspecified fracture of left patella, subsequent encounter for closed fracture with routine healing: Secondary | ICD-10-CM | POA: Diagnosis not present

## 2015-11-15 DIAGNOSIS — Z79899 Other long term (current) drug therapy: Secondary | ICD-10-CM | POA: Diagnosis not present

## 2015-11-15 DIAGNOSIS — M545 Low back pain: Secondary | ICD-10-CM | POA: Diagnosis not present

## 2015-11-15 DIAGNOSIS — M47817 Spondylosis without myelopathy or radiculopathy, lumbosacral region: Secondary | ICD-10-CM | POA: Diagnosis not present

## 2015-11-15 DIAGNOSIS — M5137 Other intervertebral disc degeneration, lumbosacral region: Secondary | ICD-10-CM | POA: Diagnosis not present

## 2015-11-15 DIAGNOSIS — G894 Chronic pain syndrome: Secondary | ICD-10-CM | POA: Diagnosis not present

## 2015-11-22 DIAGNOSIS — M79609 Pain in unspecified limb: Secondary | ICD-10-CM | POA: Diagnosis not present

## 2015-11-22 DIAGNOSIS — M545 Low back pain: Secondary | ICD-10-CM | POA: Diagnosis not present

## 2015-11-27 ENCOUNTER — Other Ambulatory Visit: Payer: Self-pay | Admitting: *Deleted

## 2015-11-27 DIAGNOSIS — M47817 Spondylosis without myelopathy or radiculopathy, lumbosacral region: Secondary | ICD-10-CM | POA: Diagnosis not present

## 2015-11-27 MED ORDER — LEVOTHYROXINE SODIUM 100 MCG PO TABS: 100.0000 ug | ORAL_TABLET | Freq: Every day | ORAL | Status: DC

## 2015-11-27 NOTE — Telephone Encounter (Signed)
Received fax requesting refill on Levothyroxine.   Refill appropriate and filled per protocol. 

## 2015-12-12 ENCOUNTER — Ambulatory Visit (INDEPENDENT_AMBULATORY_CARE_PROVIDER_SITE_OTHER): Payer: Medicare Other | Admitting: Neurology

## 2015-12-12 ENCOUNTER — Encounter: Payer: Self-pay | Admitting: Neurology

## 2015-12-12 ENCOUNTER — Encounter (INDEPENDENT_AMBULATORY_CARE_PROVIDER_SITE_OTHER): Payer: Self-pay

## 2015-12-12 VITALS — BP 198/74 | HR 63 | Ht 61.5 in | Wt 137.0 lb

## 2015-12-12 DIAGNOSIS — R413 Other amnesia: Secondary | ICD-10-CM | POA: Diagnosis not present

## 2015-12-12 DIAGNOSIS — R0602 Shortness of breath: Secondary | ICD-10-CM

## 2015-12-12 MED ORDER — QUETIAPINE FUMARATE 25 MG PO TABS: 50.0000 mg | ORAL_TABLET | Freq: Every day | ORAL | Status: DC

## 2015-12-12 NOTE — Progress Notes (Signed)
Chief Complaint  Patient presents with  . Memory Loss    MMSE 24/30 - 13 animals.  She is here with her daughters, Jackelyn Poling and Juliann Pulse.  They all feel her memory has continued to decline.  She is more confused in the evenings and seroquel 25mg  at bedtime is no longer very helpful.  They did add melatonin (unsure of the mg) a few nights ago and this seems to aid her in getting to sleep.      PATIENT: Michelle [name] DOB: September 24, 1932  HISTORICAL  [name] 82yo RH   HPI: Patient returns for followup for her mild memory trouble.  She has past medical history of diabetes, insulin-dependent, hypertension, hyperlipidemia, polymyalgia rheumatica, she also had a history of right knee replacement 02-22-10, lumbar, cervical decompression surgery in the past.   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  MRI scan of the brain showed moderate degree of global cerebral atrophy and mild degree of changes of chronic microvascular ischemia.   Her maternal aunts suffered dementia,  her mother died of breast cancer at age 21, her father died of coronary artery disease at age 34.  Her paternal uncle, aunt suffered dementia,  She also reported a history of bradycardia, is followed by cardiologist, there was a period of time, pacemaker placement was considered, she is tolerating Aricept 5 mg, today's heart rate is 46s, after discussed  with patient, we stopped Aricept, [place] was started since July 2014,   UPDATE Jan 15th 2015; She took Namenda for about 6 months, did not notice any significant difference, She has run out of namenda because of donut hole, she is less active because of bilateral hip and knee pain. She is using Fentanyl patch 32mcg q week, instead of q3 days, it made her too sleepy.  UPDATE March 24th 2016; She lives alone, drives herself to clinic today, Mini-Mental Status Examination is 64 out of 30, she is not oriented to date, has difficulty spell world backwards She is no longer taking Namenda because the high co-pay in the past, she complains worsening low back pain, had a previous lumbar, cervical decompression surgery by Dr. Saintclair Halsted, 3 epidural injection in 22-Feb-2014, most recent one was May 2015, failed to improve her symptoms, she is now using fentanyl patch 75 g, mild urinary incontinence, wearing pads, using a cane,  UPDATE Nov 2016: She is accompanied by her 2 daughters at today's clinical visit, she fell in August 20 fifth 2016, broke her left knee, is in left leg splint, daughter also reported gradual decline of functional status, memory trouble, agitations, she sometimes got confused "want to go home", while she lived in the same house since 43s.  We have reviewed MRI of the brain September 2016, evidence of moderate generalized atrophy, especially at  perisylvarian fissure, mild small vessel disease.  Her family is helping managing her medications now, she still does insulin shots herself, but the dosage was  measured by her daughter, she is also taking fentanyl patch for her left knee pain, chronic low back pain.  UPDATE Dec 12 2015: Her mind is not function not as good as it used to be, she has trouble with memory,  She is in good mood, sometimes more confused about her surrounding, she eats well, sleeps well.  She is now taking melatonin, she lives by herself,   She is not qualified for CREED study.   She is now taking seroquel 25mg  1/2 to one tab po qhs. Still has evening time agitations,  Today she was noted to have elevated blood pressure on repeat testing was 220 over 90, she is taking Micardis 40 mg daily     REVIEW OF SYSTEMS: Full 14 system review of systems performed and notable only for cold intolerance, bruise easily, confusion, joint pain, low back pain, daytime sleepiness, wheezing, shortness of breath, murmur, drooling  ALLERGIES: No Known Allergies  HOME MEDICATIONS: Current Outpatient Prescriptions  Medication Sig Dispense Refill  . fentaNYL (DURAGESIC - DOSED MCG/HR) 50 MCG/HR Place 50 mcg onto the skin every 3 (three) days.    Marland Kitchen HYDROcodone-acetaminophen (NORCO/VICODIN) 5-325 MG per tablet Take 1 tablet by mouth as needed.     . insulin lispro (HUMALOG) 100 UNIT/ML injection Inject 0-6 Units into the skin 3 (three) times daily as needed for high blood sugar. Patient uses sliding scale    . insulin NPH (HUMULIN N,NOVOLIN N) 100 UNIT/ML injection Inject 20 Units into the skin every morning.     Marland Kitchen levothyroxine (SYNTHROID, LEVOTHROID) 100 MCG tablet Take 1 tablet (100 mcg total) by mouth daily. 90 tablet 1  . LIVALO 4 MG TABS Take 4 mg by mouth every Monday, Wednesday, and Friday. 3 x a week    . loperamide (IMODIUM) 2 MG capsule Take 2 mg by mouth daily.     Marland Kitchen MELATONIN PO Take by mouth at bedtime.    . memantine (NAMENDA) 10 MG tablet Take 1 tablet (10 mg total) by mouth 2 (two) times daily. 60 tablet 11  . Multiple Vitamin (MULTIVITAMIN) tablet Take 1 tablet by mouth daily.      . ONE TOUCH ULTRA TEST test strip TEST BS 5-6 TIMES A DAY  6  . predniSONE (DELTASONE) 1 MG tablet Take 4 mg by mouth daily.    . QUEtiapine (SEROQUEL) 25 MG tablet 1/2 to one tab po qhs 30 tablet 6  . telmisartan (MICARDIS) 40 MG tablet Take 1 tablet by mouth daily.    Marland Kitchen tiotropium (SPIRIVA HANDIHALER) 18 MCG inhalation capsule Place 1 capsule (18 mcg total) into inhaler and inhale daily. 90  capsule 3   No current facility-administered medications for this visit.    PAST MEDICAL HISTORY: Past Medical History  Diagnosis Date  . Type II or unspecified type diabetes mellitus without mention of complication, not stated as uncontrolled   . Dyslipidemia   . Asthma   . Arthritis   . Gallstones   . COPD (chronic obstructive pulmonary disease) ([place])   . Interstitial cystitis   . UTI (lower urinary tract infection)   . Malaise and fatigue   . Memory loss   . Coronary atherosclerosis   . Systemic hypertension   . Polymyalgia rheumatica ([place])   . Hypothyroidism   . History of fractured kneecap     PAST SURGICAL HISTORY: Past Surgical History  Procedure Laterality Date  . Lumbar disc surgery    . Total abdominal hysterectomy    .  Rotator cuff repair    . Tonsillectomy    . Cervical spine surgery    . Cholecystectomy    . Hernia repair    . Cardiac catheterization  01/29/2006    10% luminal irregularity mid LAD  . Cardiac catheterization  03/15/2001    No significant CAD, no evidence of renal artery stenosis, systemic hypertenion  . US echocardiography  09/14/2007    mild LVH, LA mildly dilated,mild mitral annular calcification, AOV mildly sclerotic, aortic root sclerosis/ca+  . Nm myocar perf wall motion  08/17/2009    normal    FAMILY HISTORY: Family History  Problem Relation Age of Onset  . Breast cancer Mother   . Heart disease Father   . Lung cancer Brother     SOCIAL HISTORY:  Social History   Social History  . Marital Status: Widowed    Spouse Name: N/A  . Number of Children: 4  . Years of Education: 12   Occupational History  . retired    Social History Main Topics  . Smoking status: Never Smoker   . Smokeless tobacco: Never Used  . Alcohol Use: No  . Drug Use: No  . Sexual Activity: Not on file   Other Topics Concern  . Not on file   Social History Narrative   Patient lives at home alone. Widowed.   Retired.   Education High school    Right handed   Caffeine- some times coffee and soda one cup.     PHYSICAL EXAM   Filed Vitals:   12/12/15 1559  BP: 198/74  Pulse: 63  Height: 5' 1.5" (1.562 m)  Weight: 137 lb (62.143 kg)    Not recorded      Body mass index is 25.47 kg/(m^2).  PHYSICAL EXAMNIATION:  Gen: NAD, conversant, well nourised, obese, well groomed                     Cardiovascular: Regular rate rhythm, no peripheral edema, warm, nontender. Eyes: Conjunctivae clear without exudates or hemorrhage Neck: Supple, no carotid bruise. Pulmonary: Clear to auscultation bilaterally   NEUROLOGICAL EXAM:  MENTAL STATUS: Speech:    Speech is normal; fluent and spontaneous with normal comprehension.  Cognition: Mini-Mental Status Examination is 24 out of 30, animal naming is 13     Orientation: She is not oriented to date, year, month, day, season     Recent and remote memory: She missed one out of 3 recalls     Normal Attention span and concentration     Normal Language, naming, repeating,spontaneous speech     Fund of knowledge    CRANIAL NERVES: CN II: Visual fields are full to confrontation.  Pupils are 4 mm and briskly reactive to light. Visual acuity is 20/20 bilaterally. CN III, IV, VI: extraocular movement are normal. No ptosis. CN V: Facial sensation is intact to pinprick in all 3 divisions bilaterally. Corneal responses are intact.  CN VII: Face is symmetric with normal eye closure and smile. CN VIII: Hearing is normal to rubbing fingers CN IX, X: Palate elevates symmetrically. Phonation is normal. CN XI: Head turning and shoulder shrug are intact CN XII: Tongue is midline with normal movements and no atrophy.  MOTOR: There is no pronator drift of out-stretched arms. Muscle bulk and tone are normal. Muscle strength is normal, left lower extremity muscle strength is limited because left knee brace   REFLEXES: Present and symmetric.  SENSORY: Light touch, pinprick, position sense, and  vibration  sense are intact in fingers and toes.  COORDINATION: Rapid alternating movements and fine finger movements are intact. There is no dysmetria on finger-to-nose  GAIT/STANCE: Need assistant to get up from seated position, antalgic, difficulty initiate gait   DIAGNOSTIC DATA (LABS, IMAGING, TESTING) - I reviewed patient records, labs, notes, testing and imaging myself where available.   ASSESSMENT AND PLAN  WESLEE ELLERY is a 82 y.o. female with gradual onset memory trouble, today's Mini-Mental status examination is 28 out of 30, she also complains of chronic low back pain, gait difficulty,  Mild cognitive impairment  Continue Namenda 10 mg twice a day  She could not tolerate Aricept in the past, with bradycardia heart rate of 50   I have increased seroquel to 25 mg 2 tablets every night for evening time education  Elevated blood pressure  Repeat Pressure measurement showed blood pressure 220/90, I have advised her take extra 40 mg Micardis tonight, close monitoring her blood pressure, if she show any signs of confusion, focal neurological signs, she needs to go to emergency room   document blood pressure regularly   Chronic low back pain,  chronic knee pain  likely lumbar radiculopathy  moderate exercise, pain management  Face to face time was 30 minutes, greater than 50% of the time was spent in counseling and coordination of care with the patient   Marcial Pacas, M.D. Ph.D.  [facility] [address], [place], [place] [address] Ph: [phone] Fax: [phone]

## 2015-12-18 ENCOUNTER — Ambulatory Visit (INDEPENDENT_AMBULATORY_CARE_PROVIDER_SITE_OTHER): Payer: Medicare Other | Admitting: Family Medicine

## 2015-12-18 ENCOUNTER — Encounter: Payer: Self-pay | Admitting: Family Medicine

## 2015-12-18 VITALS — BP 226/88 | HR 80 | Temp 98.0°F | Resp 16 | Wt 132.0 lb

## 2015-12-18 DIAGNOSIS — E039 Hypothyroidism, unspecified: Secondary | ICD-10-CM | POA: Diagnosis not present

## 2015-12-18 DIAGNOSIS — I119 Hypertensive heart disease without heart failure: Secondary | ICD-10-CM | POA: Diagnosis not present

## 2015-12-18 MED ORDER — AMLODIPINE BESYLATE 10 MG PO TABS: 10.0000 mg | ORAL_TABLET | Freq: Every day | ORAL | Status: DC

## 2015-12-18 NOTE — Progress Notes (Signed)
Subjective:    Patient ID: Michelle [name], female    DOB: 01-02-1932, 82 y.o.   MRN: [id number]  HPI  Recently, the patient's blood pressure has spiraled out of control. Her blood pressures at home range [id number] systolic over [id number] diastolic and I personally checked the blood pressure myself today and found to be 226/88. She denies any shortness of breath. She denies any chest pain. She denies any dyspnea on exertion. She denies any headache or blurry vision. She is not taking any type of antiplatelet agent at the present time or other blood thinner. She denies any blood in her urine. She reports a normal urine output. Past Medical History  Diagnosis Date  . Type II or unspecified type diabetes mellitus without mention of complication, not stated as uncontrolled   . Dyslipidemia   . Asthma   . Arthritis   . Gallstones   . COPD (chronic obstructive pulmonary disease) ([place])   . Interstitial cystitis   . UTI (lower urinary tract infection)   . Malaise and fatigue   . Memory loss   . Coronary atherosclerosis   . Systemic hypertension   . Polymyalgia rheumatica ([place])   . Hypothyroidism   . History of fractured kneecap    Past Surgical History  Procedure Laterality Date  . Lumbar disc surgery    . Total abdominal hysterectomy    . Rotator cuff repair    . Tonsillectomy    . Cervical spine surgery    . Cholecystectomy    . Hernia repair    . Cardiac catheterization  01/29/2006    10% luminal irregularity mid LAD  . Cardiac catheterization  03/15/2001    No significant CAD, no evidence of renal artery stenosis, systemic hypertenion  . US echocardiography  09/14/2007    mild LVH, LA mildly dilated,mild mitral annular calcification, AOV mildly sclerotic, aortic root sclerosis/ca+  . Nm myocar perf wall motion  08/17/2009    normal   Current Outpatient Prescriptions on File Prior to Visit  Medication Sig Dispense Refill  . fentaNYL (DURAGESIC - DOSED MCG/HR) 50 MCG/HR Place 50 mcg onto  the skin every 3 (three) days.    Marland Kitchen HYDROcodone-acetaminophen (NORCO/VICODIN) 5-325 MG per tablet Take 1 tablet by mouth as needed.     . insulin lispro (HUMALOG) 100 UNIT/ML injection Inject 0-6 Units into the skin 3 (three) times daily as needed for high blood sugar. Patient uses sliding scale    . insulin NPH (HUMULIN N,NOVOLIN N) 100 UNIT/ML injection Inject 20 Units into the skin every morning.     Marland Kitchen levothyroxine (SYNTHROID, LEVOTHROID) 100 MCG tablet Take 1 tablet (100 mcg total) by mouth daily. 90 tablet 1  . LIVALO 4 MG TABS Take 4 mg by mouth every Monday, Wednesday, and Friday. 3 x a week    . loperamide (IMODIUM) 2 MG capsule Take 2 mg by mouth daily.     Marland Kitchen MELATONIN PO Take by mouth at bedtime.    . memantine (NAMENDA) 10 MG tablet Take 1 tablet (10 mg total) by mouth 2 (two) times daily. 60 tablet 11  . Multiple Vitamin (MULTIVITAMIN) tablet Take 1 tablet by mouth daily.      . ONE TOUCH ULTRA TEST test strip TEST BS 5-6 TIMES A DAY  6  . predniSONE (DELTASONE) 1 MG tablet Take 4 mg by mouth daily.    . QUEtiapine (SEROQUEL) 25 MG tablet Take 2 tablets (50 mg total) by mouth at bedtime. 60 tablet  11  . telmisartan (MICARDIS) 40 MG tablet Take 1 tablet by mouth daily.    Marland Kitchen tiotropium (SPIRIVA HANDIHALER) 18 MCG inhalation capsule Place 1 capsule (18 mcg total) into inhaler and inhale daily. 90 capsule 3   No current facility-administered medications on file prior to visit.   No Known Allergies Social History   Social History  . Marital Status: Widowed    Spouse Name: N/A  . Number of Children: 4  . Years of Education: 12   Occupational History  . retired    Social History Main Topics  . Smoking status: Never Smoker   . Smokeless tobacco: Never Used  . Alcohol Use: No  . Drug Use: No  . Sexual Activity: Not on file   Other Topics Concern  . Not on file   Social History Narrative   Patient lives at home alone. Widowed.   Retired.   Education High school   Right  handed   Caffeine- some times coffee and soda one cup.      Review of Systems  All other systems reviewed and are negative.      Objective:   Physical Exam  Constitutional: She is oriented to person, place, and time. She appears well-developed and well-nourished.  Neck: Neck supple. No JVD present. No thyromegaly present.  Cardiovascular: Normal rate and regular rhythm.   Murmur heard. Pulmonary/Chest: Effort normal and breath sounds normal. No respiratory distress. She has no wheezes. She has no rales. She exhibits no tenderness.  Abdominal: Soft. Bowel sounds are normal. She exhibits no distension and no mass. There is no tenderness. There is no rebound and no guarding.  Lymphadenopathy:    She has no cervical adenopathy.  Neurological: She is alert and oriented to person, place, and time. She has normal reflexes. No cranial nerve deficit. She exhibits normal muscle tone. Coordination normal.          Assessment & Plan:  Hypertensive heart disease without heart failure - Plan: amLODipine (NORVASC) 10 MG tablet, COMPLETE METABOLIC PANEL WITH GFR, CBC with Differential/Platelet, TSH  Given her advanced age, I do not want to drop the blood pressure drastically too quickly. Begin amlodipine 10 mg by mouth daily and recheck blood pressure on Friday. Consider clonidine at that point if there has not been a significant drop in the blood pressure. I will also check a CMP to evaluate for renal failure. If there has been a drastic rise in her creatinine, I would evaluate for renal artery stenosis as well as perform a renal ultrasound to evaluate for obstruction. I will also check a TSH to ensure that she is not taking too much thyroid medication.

## 2015-12-19 LAB — CBC WITH DIFFERENTIAL/PLATELET
BASOS ABS: 0 10*3/uL (ref 0.0–0.1)
BASOS PCT: 0 % (ref 0–1)
EOS ABS: 0 10*3/uL (ref 0.0–0.7)
EOS PCT: 0 % (ref 0–5)
HCT: 35.8 % — ABNORMAL LOW (ref 36.0–46.0)
Hemoglobin: 11.8 g/dL — ABNORMAL LOW (ref 12.0–15.0)
Lymphocytes Relative: 14 % (ref 12–46)
Lymphs Abs: 1 10*3/uL (ref 0.7–4.0)
MCH: 30.5 pg (ref 26.0–34.0)
MCHC: 33 g/dL (ref 30.0–36.0)
MCV: 92.5 fL (ref 78.0–100.0)
MPV: 10.6 fL (ref 8.6–12.4)
Monocytes Absolute: 0.5 10*3/uL (ref 0.1–1.0)
Monocytes Relative: 7 % (ref 3–12)
NEUTROS PCT: 79 % — AB (ref 43–77)
Neutro Abs: 5.8 10*3/uL (ref 1.7–7.7)
PLATELETS: 245 10*3/uL (ref 150–400)
RBC: 3.87 MIL/uL (ref 3.87–5.11)
RDW: 13.8 % (ref 11.5–15.5)
WBC: 7.4 10*3/uL (ref 4.0–10.5)

## 2015-12-19 LAB — COMPLETE METABOLIC PANEL WITH GFR
ALBUMIN: 3.7 g/dL (ref 3.6–5.1)
ALK PHOS: 52 U/L (ref 33–130)
ALT: 9 U/L (ref 6–29)
AST: 17 U/L (ref 10–35)
BILIRUBIN TOTAL: 0.4 mg/dL (ref 0.2–1.2)
BUN: 31 mg/dL — ABNORMAL HIGH (ref 7–25)
CALCIUM: 8.8 mg/dL (ref 8.6–10.4)
CO2: 29 mmol/L (ref 20–31)
CREATININE: 1.21 mg/dL — AB (ref 0.60–0.88)
Chloride: 100 mmol/L (ref 98–110)
GFR, EST AFRICAN AMERICAN: 48 mL/min — AB (ref >=60)
GFR, EST NON AFRICAN AMERICAN: 42 mL/min — AB (ref >=60)
Glucose, Bld: 129 mg/dL — ABNORMAL HIGH (ref 70–99)
Potassium: 4.1 mmol/L (ref 3.5–5.3)
Sodium: 138 mmol/L (ref 135–146)
TOTAL PROTEIN: 6 g/dL — AB (ref 6.1–8.1)

## 2015-12-19 LAB — TSH: TSH: 1.52 mIU/L

## 2015-12-21 ENCOUNTER — Telehealth: Payer: Self-pay | Admitting: Family Medicine

## 2015-12-21 MED ORDER — ONETOUCH ULTRA BLUE VI STRP: ORAL_STRIP | Status: DC

## 2015-12-21 MED ORDER — LEVOTHYROXINE SODIUM 100 MCG PO TABS: 100.0000 ug | ORAL_TABLET | Freq: Every day | ORAL | Status: DC

## 2015-12-21 NOTE — Telephone Encounter (Signed)
Pt's daughter, Juliann Pulse called to let Dr. Dennard Schaumann know that her blood pressure readings are coming down. They are as follows: Wednesday- 180/69 Thursday- @ 6am 115-54 and 158/73 10pm She also needs a refill of Levothyroxine 100 mg and One Touch test strips.  [place]: [phone]

## 2015-12-21 NOTE — Telephone Encounter (Signed)
Called placed to [place] about BP and requested med sent to [facility]

## 2015-12-21 NOTE — Telephone Encounter (Signed)
These are definitely better, recheck in 1 week.

## 2015-12-25 ENCOUNTER — Telehealth: Payer: Self-pay | Admitting: Family Medicine

## 2015-12-25 MED ORDER — CLONIDINE HCL 0.1 MG PO TABS: 0.1000 mg | ORAL_TABLET | Freq: Two times a day (BID) | ORAL | Status: DC

## 2015-12-25 NOTE — Telephone Encounter (Signed)
Called placed to daughter Jackelyn Poling and pt's BP's are - on top - [place] #'s [id number]. Also she is very tired and sleepy more so then usual.

## 2015-12-25 NOTE — Telephone Encounter (Signed)
Debbie aware and med sent to [facility]

## 2015-12-25 NOTE — Telephone Encounter (Signed)
Patients daughter Jannette Spanner calling to discuss patients blood pressure  [phone]

## 2015-12-25 NOTE — Telephone Encounter (Signed)
Amlodipine is not doing as well as I had hoped.  I would start clonidine 0.1 mg pobid.

## 2015-12-26 ENCOUNTER — Telehealth: Payer: Self-pay | Admitting: Family Medicine

## 2015-12-26 NOTE — Telephone Encounter (Signed)
Daughter called back and stated that pt Pulse rate was diown to 50 and pt is sleeping a lot, daughter stated she had given her the clonidine this morning and is due for another later tonight. I advised daughter not to give pt tonights dose and before giving her one in the morning to check her pulse again and if low to not give to her and call us and we can give her further information as to what needs to be done.  MD please advise if there is something I missed or needs to be done

## 2015-12-26 NOTE — Telephone Encounter (Signed)
Patients daughter Jackelyn Poling calling about the clonidine that was prescribed for her mom, says that it is making her pulse rate low   [phone]

## 2015-12-27 MED ORDER — LEVOTHYROXINE SODIUM 100 MCG PO TABS: 100.0000 ug | ORAL_TABLET | Freq: Every day | ORAL | Status: DC

## 2015-12-27 MED ORDER — ONETOUCH ULTRA BLUE VI STRP: ORAL_STRIP | Status: DC

## 2015-12-27 MED ORDER — HYDROCHLOROTHIAZIDE 25 MG PO TABS: 25.0000 mg | ORAL_TABLET | Freq: Every day | ORAL | Status: DC

## 2015-12-27 NOTE — Telephone Encounter (Signed)
Daughter, Juliann Pulse, aware of medication change.  Rx to pharmacy.  1 week follow up appt made.

## 2015-12-27 NOTE — Telephone Encounter (Signed)
Discontinue clonidine if slowing heart rate too much and making her sleepy.  We could try hctz 25 poqday which should not cause either problem and recheck her renal fcn here at appt in 1 week.

## 2015-12-28 DIAGNOSIS — M545 Low back pain: Secondary | ICD-10-CM | POA: Diagnosis not present

## 2015-12-28 DIAGNOSIS — Z79899 Other long term (current) drug therapy: Secondary | ICD-10-CM | POA: Diagnosis not present

## 2015-12-28 DIAGNOSIS — G894 Chronic pain syndrome: Secondary | ICD-10-CM | POA: Diagnosis not present

## 2015-12-28 DIAGNOSIS — M5137 Other intervertebral disc degeneration, lumbosacral region: Secondary | ICD-10-CM | POA: Diagnosis not present

## 2015-12-28 DIAGNOSIS — M47817 Spondylosis without myelopathy or radiculopathy, lumbosacral region: Secondary | ICD-10-CM | POA: Diagnosis not present

## 2016-01-03 ENCOUNTER — Encounter: Payer: Self-pay | Admitting: Family Medicine

## 2016-01-03 ENCOUNTER — Ambulatory Visit (INDEPENDENT_AMBULATORY_CARE_PROVIDER_SITE_OTHER): Payer: Medicare Other | Admitting: Family Medicine

## 2016-01-03 VITALS — BP 180/78 | HR 78 | Temp 97.9°F | Resp 16 | Wt 137.0 lb

## 2016-01-03 DIAGNOSIS — I10 Essential (primary) hypertension: Secondary | ICD-10-CM | POA: Diagnosis not present

## 2016-01-03 NOTE — Progress Notes (Signed)
Subjective:    Patient ID: Michelle [name], female    DOB: December 06, 1931, 82 y.o.   MRN: [id number]  HPI 12/18/15 Recently, the patient's blood pressure has spiraled out of control. Her blood pressures at home range [id number] systolic over [id number] diastolic and I personally checked the blood pressure myself today and found to be 226/88. She denies any shortness of breath. She denies any chest pain. She denies any dyspnea on exertion. She denies any headache or blurry vision. She is not taking any type of antiplatelet agent at the present time or other blood thinner. She denies any blood in her urine. She reports a normal urine output.  At that time, my plan was: Given her advanced age, I do not want to drop the blood pressure drastically too quickly. Begin amlodipine 10 mg by mouth daily and recheck blood pressure on Friday. Consider clonidine at that point if there has not been a significant drop in the blood pressure. I will also check a CMP to evaluate for renal failure. If there has been a drastic rise in her creatinine, I would evaluate for renal artery stenosis as well as perform a renal ultrasound to evaluate for obstruction. I will also check a TSH to ensure that she is not taking too much thyroid medication.  01/03/16  in the interval time,  I intended to add clonidine to amlodipine as well as the telmisartan  That the patient had previously been taking when her daughter called me back in stated that her blood pressure  Had not improved. What I did not realize was that the patient's family had stopped the telmisartan when I put her on amlodipine.   They also discontinued amlodipine and placed her only on clonidine as they were under the impression that we were simply  Switching medications not adding them. They later called back and stated that the clonidine made her too sleepy and slow her heart rate is wanted them to switch clonidine to hydrochlorothiazide in addition to the amlodipine and the  telmisartan  I believe she was taking. However they only started her on hydrochlorothiazide. Therefore her blood pressure today is only on hydrochlorothiazide. She is not taking telmisartan. She is not taking amlodipine. And she has stopped the clonidine. I rechecked her blood pressure found it to be 180/78. Past Medical History  Diagnosis Date  . Type II or unspecified type diabetes mellitus without mention of complication, not stated as uncontrolled   . Dyslipidemia   . Asthma   . Arthritis   . Gallstones   . COPD (chronic obstructive pulmonary disease) ([place])   . Interstitial cystitis   . UTI (lower urinary tract infection)   . Malaise and fatigue   . Memory loss   . Coronary atherosclerosis   . Systemic hypertension   . Polymyalgia rheumatica ([place])   . Hypothyroidism   . History of fractured kneecap    Past Surgical History  Procedure Laterality Date  . Lumbar disc surgery    . Total abdominal hysterectomy    . Rotator cuff repair    . Tonsillectomy    . Cervical spine surgery    . Cholecystectomy    . Hernia repair    . Cardiac catheterization  01/29/2006    10% luminal irregularity mid LAD  . Cardiac catheterization  03/15/2001    No significant CAD, no evidence of renal artery stenosis, systemic hypertenion  . US echocardiography  09/14/2007    mild LVH, LA mildly dilated,mild mitral  annular calcification, AOV mildly sclerotic, aortic root sclerosis/ca+  . Nm myocar perf wall motion  08/17/2009    normal   Current Outpatient Prescriptions on File Prior to Visit  Medication Sig Dispense Refill  . amLODipine (NORVASC) 10 MG tablet Take 1 tablet (10 mg total) by mouth daily. 90 tablet 3  . fentaNYL (DURAGESIC - DOSED MCG/HR) 50 MCG/HR Place 50 mcg onto the skin every 3 (three) days.    . hydrochlorothiazide (HYDRODIURIL) 25 MG tablet Take 1 tablet (25 mg total) by mouth daily. 30 tablet 2  . HYDROcodone-acetaminophen (NORCO/VICODIN) 5-325 MG per tablet Take 1 tablet by mouth  as needed.     . insulin lispro (HUMALOG) 100 UNIT/ML injection Inject 0-6 Units into the skin 3 (three) times daily as needed for high blood sugar. Patient uses sliding scale    . insulin NPH (HUMULIN N,NOVOLIN N) 100 UNIT/ML injection Inject 20 Units into the skin every morning.     Marland Kitchen levothyroxine (SYNTHROID, LEVOTHROID) 100 MCG tablet Take 1 tablet (100 mcg total) by mouth daily. 90 tablet 1  . LIVALO 4 MG TABS Take 4 mg by mouth every Monday, Wednesday, and Friday. 3 x a week    . loperamide (IMODIUM) 2 MG capsule Take 2 mg by mouth daily.     Marland Kitchen MELATONIN PO Take by mouth at bedtime.    . memantine (NAMENDA) 10 MG tablet Take 1 tablet (10 mg total) by mouth 2 (two) times daily. 60 tablet 11  . Multiple Vitamin (MULTIVITAMIN) tablet Take 1 tablet by mouth daily.      . ONE TOUCH ULTRA TEST test strip TEST BS 5-6 TIMES A DAY 200 each 6  . predniSONE (DELTASONE) 1 MG tablet Take 4 mg by mouth daily.    . QUEtiapine (SEROQUEL) 25 MG tablet Take 2 tablets (50 mg total) by mouth at bedtime. 60 tablet 11  . telmisartan (MICARDIS) 40 MG tablet Take 1 tablet by mouth daily.    Marland Kitchen tiotropium (SPIRIVA HANDIHALER) 18 MCG inhalation capsule Place 1 capsule (18 mcg total) into inhaler and inhale daily. 90 capsule 3   No current facility-administered medications on file prior to visit.   No Known Allergies Social History   Social History  . Marital Status: Widowed    Spouse Name: N/A  . Number of Children: 4  . Years of Education: 12   Occupational History  . retired    Social History Main Topics  . Smoking status: Never Smoker   . Smokeless tobacco: Never Used  . Alcohol Use: No  . Drug Use: No  . Sexual Activity: Not on file   Other Topics Concern  . Not on file   Social History Narrative   Patient lives at home alone. Widowed.   Retired.   Education High school   Right handed   Caffeine- some times coffee and soda one cup.      Review of Systems  All other systems reviewed  and are negative.      Objective:   Physical Exam  Constitutional: She is oriented to person, place, and time. She appears well-developed and well-nourished.  Neck: Neck supple. No JVD present. No thyromegaly present.  Cardiovascular: Normal rate and regular rhythm.   Murmur heard. Pulmonary/Chest: Effort normal and breath sounds normal. No respiratory distress. She has no wheezes. She has no rales. She exhibits no tenderness.  Abdominal: Soft. Bowel sounds are normal. She exhibits no distension and no mass. There is no tenderness. There  is no rebound and no guarding.  Lymphadenopathy:    She has no cervical adenopathy.  Neurological: She is alert and oriented to person, place, and time. She has normal reflexes. No cranial nerve deficit. She exhibits normal muscle tone. Coordination normal.          Assessment & Plan:  Ess HTN-  patient's blood pressure has not improved. However all we have done is switch medications. This is been through miscommunication and misunderstanding. Therefore I've asked the family to resume the telmisartan. They will also resume amlodipine 10 mg a day. I would like him to recheck her blood pressure in one week and if her blood pressure still greater than 123XX123 systolic, I would add hydrochlorothiazide to the combination of amlodipine and telmisartan.

## 2016-01-07 DIAGNOSIS — M159 Polyosteoarthritis, unspecified: Secondary | ICD-10-CM | POA: Diagnosis not present

## 2016-01-07 DIAGNOSIS — M353 Polymyalgia rheumatica: Secondary | ICD-10-CM | POA: Diagnosis not present

## 2016-01-07 DIAGNOSIS — F0390 Unspecified dementia without behavioral disturbance: Secondary | ICD-10-CM | POA: Diagnosis not present

## 2016-01-07 DIAGNOSIS — Z79891 Long term (current) use of opiate analgesic: Secondary | ICD-10-CM | POA: Diagnosis not present

## 2016-01-09 ENCOUNTER — Telehealth: Payer: Self-pay | Admitting: Family Medicine

## 2016-01-09 NOTE — Telephone Encounter (Signed)
Pt's daughter called with blood pressure readings per Dr. Samella Parr request. They are as follows: Sat 2/25-  171/64 130/66 192/74 192/70  Sun 2/26-  193/73 165/103 196/66 188/79 Mon 2/27-  173/61 150/67 179/67 194/70 Tue 2/28- 163/65 162/66 120/60  They have not added HCTZ. Pt is lethargic and sleeping more than usual. She is eating and drinking well. Juliann Pulse: [phone]

## 2016-01-10 NOTE — Telephone Encounter (Signed)
I would add hctz to the amlodipine and micardis and recheck in 2 week.s

## 2016-01-10 NOTE — Telephone Encounter (Signed)
Tried to call Juliann Pulse not at home at the moment

## 2016-01-15 MED ORDER — HYDROCHLOROTHIAZIDE 25 MG PO TABS: 25.0000 mg | ORAL_TABLET | Freq: Every day | ORAL | Status: DC

## 2016-01-15 NOTE — Telephone Encounter (Signed)
Juliann Pulse aware and refills sent to [facility]

## 2016-01-17 DIAGNOSIS — M47817 Spondylosis without myelopathy or radiculopathy, lumbosacral region: Secondary | ICD-10-CM | POA: Diagnosis not present

## 2016-01-17 DIAGNOSIS — M545 Low back pain: Secondary | ICD-10-CM | POA: Diagnosis not present

## 2016-01-17 DIAGNOSIS — G894 Chronic pain syndrome: Secondary | ICD-10-CM | POA: Diagnosis not present

## 2016-01-17 DIAGNOSIS — M5137 Other intervertebral disc degeneration, lumbosacral region: Secondary | ICD-10-CM | POA: Diagnosis not present

## 2016-01-24 ENCOUNTER — Telehealth: Payer: Self-pay | Admitting: Family Medicine

## 2016-01-24 NOTE — Telephone Encounter (Signed)
[facility] home health nurse called to make Dr. Dennard Schaumann aware that they will go out tomorrow to begin her home health. Kendrick Fries [phone]

## 2016-01-26 ENCOUNTER — Other Ambulatory Visit: Payer: Self-pay | Admitting: Family Medicine

## 2016-02-06 ENCOUNTER — Telehealth: Payer: Self-pay | Admitting: Family Medicine

## 2016-02-06 NOTE — Telephone Encounter (Signed)
Debbie calling to discuss Michelle [name]'s blood pressure  [phone]

## 2016-02-06 NOTE — Telephone Encounter (Signed)
LMTRC

## 2016-02-13 ENCOUNTER — Other Ambulatory Visit: Payer: Self-pay | Admitting: Neurology

## 2016-02-13 NOTE — Telephone Encounter (Signed)
LMTRC

## 2016-02-21 DIAGNOSIS — G894 Chronic pain syndrome: Secondary | ICD-10-CM | POA: Diagnosis not present

## 2016-02-21 DIAGNOSIS — M47817 Spondylosis without myelopathy or radiculopathy, lumbosacral region: Secondary | ICD-10-CM | POA: Diagnosis not present

## 2016-02-21 DIAGNOSIS — M5137 Other intervertebral disc degeneration, lumbosacral region: Secondary | ICD-10-CM | POA: Diagnosis not present

## 2016-02-21 DIAGNOSIS — M545 Low back pain: Secondary | ICD-10-CM | POA: Diagnosis not present

## 2016-02-21 DIAGNOSIS — Z79899 Other long term (current) drug therapy: Secondary | ICD-10-CM | POA: Diagnosis not present

## 2016-02-21 DIAGNOSIS — Z79891 Long term (current) use of opiate analgesic: Secondary | ICD-10-CM | POA: Diagnosis not present

## 2016-02-22 ENCOUNTER — Ambulatory Visit: Payer: Medicare Other | Admitting: Internal Medicine

## 2016-02-25 ENCOUNTER — Telehealth: Payer: Self-pay | Admitting: Family Medicine

## 2016-02-25 NOTE — Telephone Encounter (Signed)
Patients daughter Juliann Pulse calling to discuss bp issues with you  [phone]

## 2016-02-25 NOTE — Telephone Encounter (Signed)
Called and spoke to Florin - pt's bp's to day have been 122/45 - Pulse 40 this am and this afternoon it was 137/55 - p- 60. She did not give her her dose of clonidine at lunch and is going over there at 4 to check her BS and I informed her to recheck her bp as well. What would you like for her to do?  Call Juliann Pulse at pt's house [phone]

## 2016-02-25 NOTE — Telephone Encounter (Signed)
Blood pressure looks good. Pulse is okay as long as is above 50. Keep a close eye on her pulse rate. If persistently low we may need to discontinue clonidine. However one isolated low heart rate does not warrant stoppiing clonidine.  If persistently low, I would discontinue clonidine.  For the time being hold clonidine if her heart rate is less than 50.  Otherwise give the clonidine as scheduled

## 2016-02-25 NOTE — Telephone Encounter (Signed)
BP this afternoon was 130/57 with a pulse of 58 and pt's daughter aware of providers recommendations.

## 2016-03-10 ENCOUNTER — Inpatient Hospital Stay (HOSPITAL_COMMUNITY)
Admission: EM | Admit: 2016-03-10 | Discharge: 2016-03-13 | DRG: 684 | Disposition: A | Discharge status: Home Health | Payer: Medicare Other | Attending: Internal Medicine | Admitting: Internal Medicine

## 2016-03-10 ENCOUNTER — Emergency Department (HOSPITAL_COMMUNITY): Payer: Medicare Other

## 2016-03-10 DIAGNOSIS — R531 Weakness: Secondary | ICD-10-CM | POA: Diagnosis not present

## 2016-03-10 DIAGNOSIS — Z794 Long term (current) use of insulin: Secondary | ICD-10-CM

## 2016-03-10 DIAGNOSIS — N189 Chronic kidney disease, unspecified: Secondary | ICD-10-CM | POA: Diagnosis present

## 2016-03-10 DIAGNOSIS — E039 Hypothyroidism, unspecified: Secondary | ICD-10-CM | POA: Diagnosis not present

## 2016-03-10 DIAGNOSIS — R1110 Vomiting, unspecified: Secondary | ICD-10-CM

## 2016-03-10 DIAGNOSIS — A084 Viral intestinal infection, unspecified: Secondary | ICD-10-CM | POA: Diagnosis present

## 2016-03-10 DIAGNOSIS — J449 Chronic obstructive pulmonary disease, unspecified: Secondary | ICD-10-CM | POA: Diagnosis present

## 2016-03-10 DIAGNOSIS — D638 Anemia in other chronic diseases classified elsewhere: Secondary | ICD-10-CM | POA: Diagnosis present

## 2016-03-10 DIAGNOSIS — E1121 Type 2 diabetes mellitus with diabetic nephropathy: Secondary | ICD-10-CM | POA: Insufficient documentation

## 2016-03-10 DIAGNOSIS — E785 Hyperlipidemia, unspecified: Secondary | ICD-10-CM | POA: Diagnosis present

## 2016-03-10 DIAGNOSIS — N3010 Interstitial cystitis (chronic) without hematuria: Secondary | ICD-10-CM | POA: Diagnosis present

## 2016-03-10 DIAGNOSIS — R1310 Dysphagia, unspecified: Secondary | ICD-10-CM | POA: Diagnosis present

## 2016-03-10 DIAGNOSIS — I2510 Atherosclerotic heart disease of native coronary artery without angina pectoris: Secondary | ICD-10-CM | POA: Diagnosis present

## 2016-03-10 DIAGNOSIS — I129 Hypertensive chronic kidney disease with stage 1 through stage 4 chronic kidney disease, or unspecified chronic kidney disease: Secondary | ICD-10-CM | POA: Diagnosis present

## 2016-03-10 DIAGNOSIS — E118 Type 2 diabetes mellitus with unspecified complications: Secondary | ICD-10-CM

## 2016-03-10 DIAGNOSIS — E119 Type 2 diabetes mellitus without complications: Secondary | ICD-10-CM

## 2016-03-10 DIAGNOSIS — Z7982 Long term (current) use of aspirin: Secondary | ICD-10-CM | POA: Diagnosis not present

## 2016-03-10 DIAGNOSIS — E1122 Type 2 diabetes mellitus with diabetic chronic kidney disease: Secondary | ICD-10-CM | POA: Diagnosis present

## 2016-03-10 DIAGNOSIS — G309 Alzheimer's disease, unspecified: Secondary | ICD-10-CM | POA: Diagnosis present

## 2016-03-10 DIAGNOSIS — Z7952 Long term (current) use of systemic steroids: Secondary | ICD-10-CM | POA: Diagnosis not present

## 2016-03-10 DIAGNOSIS — N179 Acute kidney failure, unspecified: Principal | ICD-10-CM | POA: Diagnosis present

## 2016-03-10 DIAGNOSIS — E860 Dehydration: Secondary | ICD-10-CM | POA: Diagnosis present

## 2016-03-10 DIAGNOSIS — F0280 Dementia in other diseases classified elsewhere without behavioral disturbance: Secondary | ICD-10-CM | POA: Diagnosis present

## 2016-03-10 DIAGNOSIS — M353 Polymyalgia rheumatica: Secondary | ICD-10-CM | POA: Diagnosis present

## 2016-03-10 DIAGNOSIS — D649 Anemia, unspecified: Secondary | ICD-10-CM | POA: Diagnosis present

## 2016-03-10 DIAGNOSIS — R112 Nausea with vomiting, unspecified: Secondary | ICD-10-CM | POA: Diagnosis present

## 2016-03-10 DIAGNOSIS — R05 Cough: Secondary | ICD-10-CM | POA: Diagnosis not present

## 2016-03-10 DIAGNOSIS — E869 Volume depletion, unspecified: Secondary | ICD-10-CM | POA: Diagnosis not present

## 2016-03-10 LAB — CBC
HEMATOCRIT: 32 % — AB (ref 36.0–46.0)
Hemoglobin: 10.9 g/dL — ABNORMAL LOW (ref 12.0–15.0)
MCH: 30.8 pg (ref 26.0–34.0)
MCHC: 34.1 g/dL (ref 30.0–36.0)
MCV: 90.4 fL (ref 78.0–100.0)
PLATELETS: 309 10*3/uL (ref 150–400)
RBC: 3.54 MIL/uL — AB (ref 3.87–5.11)
RDW: 14.1 % (ref 11.5–15.5)
WBC: 16.1 10*3/uL — AB (ref 4.0–10.5)

## 2016-03-10 LAB — URINALYSIS, ROUTINE W REFLEX MICROSCOPIC
Bilirubin Urine: NEGATIVE
GLUCOSE, UA: 250 mg/dL — AB
Ketones, ur: 15 mg/dL — AB
LEUKOCYTES UA: NEGATIVE
NITRITE: NEGATIVE
PROTEIN: 100 mg/dL — AB
Specific Gravity, Urine: 1.015 (ref 1.005–1.030)
pH: 6 (ref 5.0–8.0)

## 2016-03-10 LAB — COMPREHENSIVE METABOLIC PANEL
ALT: 16 U/L (ref 14–54)
AST: 48 U/L — ABNORMAL HIGH (ref 15–41)
Albumin: 4.7 g/dL (ref 3.5–5.0)
Alkaline Phosphatase: 51 U/L (ref 38–126)
Anion gap: 16 — ABNORMAL HIGH (ref 5–15)
BUN: 52 mg/dL — ABNORMAL HIGH (ref 6–20)
CHLORIDE: 101 mmol/L (ref 101–111)
CO2: 21 mmol/L — ABNORMAL LOW (ref 22–32)
CREATININE: 2.39 mg/dL — AB (ref 0.44–1.00)
Calcium: 10 mg/dL (ref 8.9–10.3)
GFR, EST AFRICAN AMERICAN: 21 mL/min — AB (ref >60)
GFR, EST NON AFRICAN AMERICAN: 18 mL/min — AB (ref >60)
Glucose, Bld: 86 mg/dL (ref 65–99)
POTASSIUM: 4.4 mmol/L (ref 3.5–5.1)
Sodium: 138 mmol/L (ref 135–145)
TOTAL PROTEIN: 7.3 g/dL (ref 6.5–8.1)
Total Bilirubin: 0.8 mg/dL (ref 0.3–1.2)

## 2016-03-10 LAB — URINE MICROSCOPIC-ADD ON

## 2016-03-10 LAB — GLUCOSE, CAPILLARY: Glucose-Capillary: 107 mg/dL — ABNORMAL HIGH (ref 65–99)

## 2016-03-10 LAB — LIPASE, BLOOD: LIPASE: 22 U/L (ref 11–51)

## 2016-03-10 LAB — I-STAT CG4 LACTIC ACID, ED: LACTIC ACID, VENOUS: 1.77 mmol/L (ref 0.5–2.0)

## 2016-03-10 MED ORDER — ONDANSETRON HCL 4 MG/2ML IJ SOLN
4.0000 mg | Freq: Four times a day (QID) | INTRAMUSCULAR | Status: DC | PRN
  Administered 2016-03-10 – 2016-03-12 (×2): 4 mg via INTRAVENOUS
  Filled 2016-03-10 (×2): qty 2

## 2016-03-10 MED ORDER — HYDROCODONE-ACETAMINOPHEN 5-325 MG PO TABS: 1.0000 | ORAL_TABLET | ORAL | Status: DC | PRN

## 2016-03-10 MED ORDER — HYDRALAZINE HCL 20 MG/ML IJ SOLN: 5.0000 mg | Freq: Three times a day (TID) | INTRAMUSCULAR | Status: DC | PRN

## 2016-03-10 MED ORDER — SODIUM CHLORIDE 0.9 % IV BOLUS (SEPSIS)
1000.0000 mL | Freq: Once | INTRAVENOUS | Status: AC
  Administered 2016-03-10: 1000 mL via INTRAVENOUS

## 2016-03-10 MED ORDER — PREDNISONE 5 MG PO TABS: 5.0000 mg | ORAL_TABLET | Freq: Every day | ORAL | Status: DC

## 2016-03-10 MED ORDER — ACETAMINOPHEN 650 MG RE SUPP: 650.0000 mg | Freq: Four times a day (QID) | RECTAL | Status: DC | PRN

## 2016-03-10 MED ORDER — FENTANYL 50 MCG/HR TD PT72
50.0000 ug | MEDICATED_PATCH | TRANSDERMAL | Status: DC
  Administered 2016-03-11: 50 ug via TRANSDERMAL
  Filled 2016-03-10: qty 1

## 2016-03-10 MED ORDER — QUETIAPINE FUMARATE 50 MG PO TABS
50.0000 mg | ORAL_TABLET | Freq: Every day | ORAL | Status: DC
  Administered 2016-03-10 – 2016-03-12 (×3): 50 mg via ORAL
  Filled 2016-03-10 (×3): qty 1

## 2016-03-10 MED ORDER — HEPARIN SODIUM (PORCINE) 5000 UNIT/ML IJ SOLN
5000.0000 [IU] | Freq: Three times a day (TID) | INTRAMUSCULAR | Status: DC
  Administered 2016-03-10 – 2016-03-13 (×10): 5000 [IU] via SUBCUTANEOUS
  Filled 2016-03-10 (×10): qty 1

## 2016-03-10 MED ORDER — FENTANYL 50 MCG/HR TD PT72: 50.0000 ug | MEDICATED_PATCH | TRANSDERMAL | Status: DC

## 2016-03-10 MED ORDER — LEVOTHYROXINE SODIUM 100 MCG PO TABS
100.0000 ug | ORAL_TABLET | Freq: Every day | ORAL | Status: DC
  Administered 2016-03-11 – 2016-03-13 (×3): 100 ug via ORAL
  Filled 2016-03-10 (×3): qty 1

## 2016-03-10 MED ORDER — AMLODIPINE BESYLATE 10 MG PO TABS
10.0000 mg | ORAL_TABLET | Freq: Every day | ORAL | Status: DC
  Administered 2016-03-10 – 2016-03-13 (×4): 10 mg via ORAL
  Filled 2016-03-10: qty 1
  Filled 2016-03-10: qty 2
  Filled 2016-03-10 (×2): qty 1

## 2016-03-10 MED ORDER — INSULIN ASPART 100 UNIT/ML ~~LOC~~ SOLN
0.0000 [IU] | Freq: Three times a day (TID) | SUBCUTANEOUS | Status: DC
  Administered 2016-03-11: 7 [IU] via SUBCUTANEOUS
  Administered 2016-03-11: 1 [IU] via SUBCUTANEOUS
  Administered 2016-03-12: 7 [IU] via SUBCUTANEOUS
  Administered 2016-03-12 (×2): 9 [IU] via SUBCUTANEOUS
  Administered 2016-03-13: 3 [IU] via SUBCUTANEOUS
  Administered 2016-03-13: 9 [IU] via SUBCUTANEOUS

## 2016-03-10 MED ORDER — ACETAMINOPHEN 325 MG PO TABS
650.0000 mg | ORAL_TABLET | Freq: Four times a day (QID) | ORAL | Status: DC | PRN
  Administered 2016-03-12: 650 mg via ORAL
  Filled 2016-03-10: qty 2

## 2016-03-10 MED ORDER — ONDANSETRON HCL 4 MG/2ML IJ SOLN
4.0000 mg | Freq: Once | INTRAMUSCULAR | Status: AC
  Administered 2016-03-10: 4 mg via INTRAVENOUS
  Filled 2016-03-10: qty 2

## 2016-03-10 MED ORDER — ONDANSETRON HCL 4 MG PO TABS
4.0000 mg | ORAL_TABLET | Freq: Four times a day (QID) | ORAL | Status: DC | PRN
Start: 2016-03-10 — End: 2016-03-13

## 2016-03-10 MED ORDER — TRAZODONE HCL 50 MG PO TABS
25.0000 mg | ORAL_TABLET | Freq: Every evening | ORAL | Status: DC | PRN
  Administered 2016-03-11: 25 mg via ORAL
  Filled 2016-03-10: qty 1

## 2016-03-10 MED ORDER — MEMANTINE HCL 10 MG PO TABS
10.0000 mg | ORAL_TABLET | Freq: Two times a day (BID) | ORAL | Status: DC
Start: 2016-03-10 — End: 2016-03-13
  Administered 2016-03-10 – 2016-03-13 (×6): 10 mg via ORAL
  Filled 2016-03-10 (×7): qty 1

## 2016-03-10 MED ORDER — SODIUM CHLORIDE 0.9 % IV SOLN
INTRAVENOUS | Status: DC
  Administered 2016-03-10 – 2016-03-13 (×5): via INTRAVENOUS

## 2016-03-10 NOTE — ED Notes (Signed)
REPEAT EKG ORDERED IN ERROR. VERBAL ORDER BY DR.SCHLOSSMAN TO D/C.

## 2016-03-10 NOTE — ED Provider Notes (Signed)
CSN: [id number]     Arrival date & time 03/10/16  [id number] History   First MD Initiated Contact with Patient 03/10/16 1121     Chief Complaint  Patient presents with  . Nausea  . Emesis     (Consider location/radiation/quality/duration/timing/severity/associated sxs/prior Treatment) Patient is a 82 y.o. female presenting with vomiting.  Emesis Severity:  Severe Duration:  1 day Timing:  Constant Number of daily episodes:  10 Progression:  Unchanged Chronicity:  New Relieved by:  Nothing Worsened by:  Nothing tried Ineffective treatments:  None tried Associated symptoms: chills and cough   Associated symptoms: no abdominal pain, no diarrhea, no fever, no headaches and no sore throat     Past Medical History  Diagnosis Date  . Type II or unspecified type diabetes mellitus without mention of complication, not stated as uncontrolled   . Dyslipidemia   . Asthma   . Arthritis   . Gallstones   . COPD (chronic obstructive pulmonary disease) ([place])   . Interstitial cystitis   . UTI (lower urinary tract infection)   . Malaise and fatigue   . Memory loss   . Coronary atherosclerosis   . Systemic hypertension   . Polymyalgia rheumatica ([place])   . Hypothyroidism   . History of fractured kneecap    Past Surgical History  Procedure Laterality Date  . Lumbar disc surgery    . Total abdominal hysterectomy    . Rotator cuff repair    . Tonsillectomy    . Cervical spine surgery    . Cholecystectomy    . Hernia repair    . Cardiac catheterization  01/29/2006    10% luminal irregularity mid LAD  . Cardiac catheterization  03/15/2001    No significant CAD, no evidence of renal artery stenosis, systemic hypertenion  . US echocardiography  09/14/2007    mild LVH, LA mildly dilated,mild mitral annular calcification, AOV mildly sclerotic, aortic root sclerosis/ca+  . Nm myocar perf wall motion  08/17/2009    normal   Family History  Problem Relation Age of Onset  . Breast cancer Mother   .  Heart disease Father   . Lung cancer Brother    Social History  Substance Use Topics  . Smoking status: Never Smoker   . Smokeless tobacco: Never Used  . Alcohol Use: No   OB History    No data available     Review of Systems  Constitutional: Positive for chills. Negative for fever.  HENT: Negative for sore throat.   Eyes: Negative for visual disturbance.  Respiratory: Negative for cough and shortness of breath.   Cardiovascular: Negative for chest pain.  Gastrointestinal: Positive for vomiting. Negative for abdominal pain and diarrhea.  Genitourinary: Negative for difficulty urinating.  Musculoskeletal: Negative for back pain and neck pain.  Skin: Negative for rash.  Neurological: Negative for syncope and headaches.      Allergies  Review of patient's allergies indicates no known allergies.  Home Medications   Prior to Admission medications   Medication Sig Start Date End Date Taking? Authorizing Provider  amLODipine (NORVASC) 10 MG tablet Take 1 tablet (10 mg total) by mouth daily. 12/18/15  Yes Susy Frizzle, MD  aspirin 81 MG tablet Take 81 mg by mouth daily.   Yes Historical Provider, MD  beta carotene w/minerals (OCUVITE) tablet Take 1 tablet by mouth daily.   Yes Historical Provider, MD  Calcium Carbonate-Vit D-Min (CALCIUM 1200 PO) Take 1 tablet by mouth daily.    Yes  Historical Provider, MD  Cholecalciferol (VITAMIN D3) 2000 units TABS Take by mouth.   Yes Historical Provider, MD  dimenhyDRINATE (DRAMAMINE) 50 MG tablet Take 50 mg by mouth every morning. Take one every morning per daughter   Yes Historical Provider, MD  diphenhydramine-acetaminophen (TYLENOL PM) 25-500 MG TABS tablet Take 1 tablet by mouth at bedtime.   Yes Historical Provider, MD  fentaNYL (DURAGESIC - DOSED MCG/HR) 50 MCG/HR Place 50 mcg onto the skin every 3 (three) days.   Yes Historical Provider, MD  hydrochlorothiazide (HYDRODIURIL) 25 MG tablet Take 1 tablet (25 mg total) by mouth daily.  01/15/16  Yes Susy Frizzle, MD  HYDROcodone-acetaminophen (NORCO/VICODIN) 5-325 MG per tablet Take 1 tablet by mouth as needed.    Yes Historical Provider, MD  insulin lispro (HUMALOG) 100 UNIT/ML injection Inject 0-6 Units into the skin 3 (three) times daily as needed for high blood sugar. Patient uses sliding scale   Yes Historical Provider, MD  insulin NPH (HUMULIN N,NOVOLIN N) 100 UNIT/ML injection Inject 18 Units into the skin every morning.    Yes Historical Provider, MD  levothyroxine (SYNTHROID, LEVOTHROID) 100 MCG tablet TAKE 1 TABLET (100 MCG TOTAL) BY MOUTH DAILY. 01/28/16  Yes Susy Frizzle, MD  loperamide (IMODIUM) 2 MG capsule Take 2 mg by mouth daily as needed for diarrhea or loose stools.    Yes Historical Provider, MD  memantine (NAMENDA) 10 MG tablet TAKE ONE TABLET BY MOUTH TWICE DAILY 02/14/16  Yes Marcial Pacas, MD  Multiple Vitamin (MULTIVITAMIN) tablet Take 1 tablet by mouth daily.     Yes Historical Provider, MD  Multiple Vitamins-Minerals (PRESERVISION AREDS PO) Take 2 capsules by mouth daily.   Yes Historical Provider, MD  Potassium 99 MG TABS Take 1 tablet by mouth daily.   Yes Historical Provider, MD  predniSONE (DELTASONE) 5 MG tablet Take 5 mg by mouth daily with breakfast.   Yes Historical Provider, MD  QUEtiapine (SEROQUEL) 25 MG tablet Take 2 tablets (50 mg total) by mouth at bedtime. 12/12/15  Yes Marcial Pacas, MD  telmisartan (MICARDIS) 40 MG tablet Take 1 tablet by mouth daily. 02/10/14  Yes Historical Provider, MD  tiotropium (SPIRIVA HANDIHALER) 18 MCG inhalation capsule Place 1 capsule (18 mcg total) into inhaler and inhale daily. 02/22/15 02/02/17 Yes Deneise Lever, MD  traMADol (ULTRAM) 50 MG tablet Take 50 mg by mouth 4 (four) times daily as needed for moderate pain. For back and leg pain per daughter   Yes Historical Provider, MD  levothyroxine (SYNTHROID, LEVOTHROID) 100 MCG tablet Take 1 tablet (100 mcg total) by mouth daily. Patient not taking: Reported on  03/10/2016 12/27/15   Susy Frizzle, MD  ONE [facility] ULTRA TEST test strip TEST BS 5-6 TIMES A DAY 12/27/15   Susy Frizzle, MD  predniSONE (DELTASONE) 1 MG tablet Take 5 mg by mouth daily. Reported on 03/10/2016    Historical Provider, MD   BP 157/68 mmHg  Pulse 95  Temp(Src) 99.3 F (37.4 C) (Oral)  Resp 18  Ht 5\' 2"  (1.575 m)  Wt 140 lb (63.504 kg)  BMI 25.60 kg/m2  SpO2 96% Physical Exam  Constitutional: She is oriented to person, place, and time. She appears well-developed and well-nourished. She appears ill. No distress.  HENT:  Head: Normocephalic and atraumatic.  Eyes: Conjunctivae and EOM are normal.  Neck: Normal range of motion.  Cardiovascular: Normal rate, regular rhythm, normal heart sounds and intact distal pulses.  Exam reveals no gallop and  no friction rub.   No murmur heard. Pulmonary/Chest: Effort normal and breath sounds normal. No respiratory distress. She has no wheezes. She has no rales.  Abdominal: Soft. She exhibits no distension. There is no tenderness. There is no guarding.  Musculoskeletal: She exhibits no edema or tenderness.  Neurological: She is alert and oriented to person, place, and time.  Skin: Skin is warm and dry. No rash noted. She is not diaphoretic. No erythema.  Nursing note and vitals reviewed.   ED Course  Procedures (including critical care time) Labs Review Labs Reviewed  COMPREHENSIVE METABOLIC PANEL - Abnormal; Notable for the following:    CO2 21 (*)    BUN 52 (*)    Creatinine, Ser 2.39 (*)    AST 48 (*)    GFR calc non Af Amer 18 (*)    GFR calc Af Amer 21 (*)    Anion gap 16 (*)    All other components within normal limits  CBC - Abnormal; Notable for the following:    WBC 16.1 (*)    RBC 3.54 (*)    Hemoglobin 10.9 (*)    HCT 32.0 (*)    All other components within normal limits  URINALYSIS, ROUTINE W REFLEX MICROSCOPIC (NOT AT [facility]) - Abnormal; Notable for the following:    Glucose, UA 250 (*)    Hgb urine dipstick  TRACE (*)    Ketones, ur 15 (*)    Protein, ur 100 (*)    All other components within normal limits  URINE MICROSCOPIC-ADD ON - Abnormal; Notable for the following:    Squamous Epithelial / LPF 0-5 (*)    Bacteria, UA FEW (*)    All other components within normal limits  GLUCOSE, CAPILLARY - Abnormal; Notable for the following:    Glucose-Capillary 107 (*)    All other components within normal limits  CULTURE, BLOOD (ROUTINE X 2)  CULTURE, BLOOD (ROUTINE X 2)  LIPASE, BLOOD  HEMOGLOBIN A1C  COMPREHENSIVE METABOLIC PANEL  CBC  PROTIME-INR  CBG MONITORING, ED  I-STAT CG4 LACTIC ACID, ED    Imaging Review Dg Chest 2 View  03/10/2016  CLINICAL DATA:  Cough. Burning sensation during urination. Symptoms for 3 days. Initial encounter. EXAM: CHEST  2 VIEW COMPARISON:  PA and lateral chest 06/12/2015. FINDINGS: The lungs are clear. Heart size is normal. No pneumothorax pleural effusion. No focal bony abnormality. IMPRESSION: Negative chest. Electronically Signed   By: Inge Rise M.D.   On: 03/10/2016 13:17   I have personally reviewed and evaluated these images and lab results as part of my medical decision-making.   EKG Interpretation   Date/Time:  Monday Mar 10 2016 10:51:08 EDT Ventricular Rate:  89 PR Interval:  166 QRS Duration: 114 QT Interval:  390 QTC Calculation: 474 R Axis:   -42 Text Interpretation:  Normal sinus rhythm Left axis deviation Septal  infarct , age undetermined Abnormal ECG T wave abnormality in lateral  leads improved from prior Confirmed by [facility] MD, [place] ([id number]) on  03/10/2016 2:24:05 PM      MDM   Final diagnoses:  Acute kidney injury ([place])   81 year old female with a history diabetes, dyslipidemia, asthma, COPD, interstitial cystitis, hypertension presents with concern for nausea and vomiting beginning yesterday. Patient with an acute kidney injury with a creatinine of 2.4 from 1.2 previously, likely secondary to dehydration in setting of  vomiting. Urinalysis was done which showed no sign of urinary tract infection. Patient reported a cough, and  chest x-ray show no sign of pneumonia.  Abdominal exam benign and doubt acute obstruction or other abnormality.  EKG without changes and pt without CP or dyspnea.  No sign of DKA. Patient given 2 L of normal saline. She'll be admitted to hospitalist service for continued observation for acute kidney injury and vomiting.   Gareth Morgan, MD 03/10/16 [phone]

## 2016-03-10 NOTE — H&P (Signed)
History and Physical    Michelle [name] [id number] DOB: May 05, 1932 DOA: 03/10/2016  Referring MD/NP/PA: EDP PCP: [name], MD  Outpatient Specialists: Patient Care Team: Susy Frizzle, MD as PCP - General (Family Medicine)  Patient coming from:  Home SNF  Rehab   Chief Complaint:  HPI: Michelle [name] is a 82 y.o. female with medical history significant for HTN, CKD, COPD, CAD, Hypothyroididsm, PMR, Alzheimer's Dementia, brought from home due to 1 day history of severe, constant nausea and vomiting following a recent episode of viral gastroenteritis 2 weeks prior to admission at which time he was accompanied by diarrhea. Symptoms began around  6am on 4/30 with a total of 8-10 episodes of emesis since, although less fequent since presentation. She has not been able to consume any foods or drink or take any of her meds due to symptoms. She has been experiencing some dysphagia with pills per daughter's report. She denies any sick contacts. He denies any fever, chills or night sweats. No chest pain or shortness of breath. She denies any dizziness or vertigo. She denies any rashes or cuts. She denies recent antibiotics or changes in his meds.    ED Course:  BP 151/68 mmHg  Pulse 96  Temp(Src) 98.9 F (37.2 C) (Oral)  Resp 18  Ht 5\' 2"  (1.575 m)  Wt 63.504 kg (140 lb)  BMI 25.60 kg/m2  SpO2 94% she received IV Zofran on 2 L of fluids with improvement of her symptoms. Labs remarkable for creatinine 2.39, white count 16.1 UA is negative for nitrites.. Chest x-ray is negative. She is being admitted for evaluation and management of symptoms.  Review of Systems: As per HPI otherwise 10 point review of systems negative.   Past Medical History  Diagnosis Date  . Type II or unspecified type diabetes mellitus without mention of complication, not stated as uncontrolled   . Dyslipidemia   . Asthma   . Arthritis   . Gallstones   . COPD (chronic obstructive pulmonary disease) ([place])     . Interstitial cystitis   . UTI (lower urinary tract infection)   . Malaise and fatigue   . Memory loss   . Coronary atherosclerosis   . Systemic hypertension   . Polymyalgia rheumatica ([place])   . Hypothyroidism   . History of fractured kneecap     Past Surgical History  Procedure Laterality Date  . Lumbar disc surgery    . Total abdominal hysterectomy    . Rotator cuff repair    . Tonsillectomy    . Cervical spine surgery    . Cholecystectomy    . Hernia repair    . Cardiac catheterization  01/29/2006    10% luminal irregularity mid LAD  . Cardiac catheterization  03/15/2001    No significant CAD, no evidence of renal artery stenosis, systemic hypertenion  . US echocardiography  09/14/2007    mild LVH, LA mildly dilated,mild mitral annular calcification, AOV mildly sclerotic, aortic root sclerosis/ca+  . Nm myocar perf wall motion  08/17/2009    normal     reports that she has never smoked. She has never used smokeless tobacco. She reports that she does not drink alcohol or use illicit drugs.  No Known Allergies  Family History  Problem Relation Age of Onset  . Breast cancer Mother   . Heart disease Father   . Lung cancer Brother     Family history reviewed and not pertinent (If you reviewed it)  Prior to  Admission medications   Medication Sig Start Date End Date Taking? Authorizing Provider  amLODipine (NORVASC) 10 MG tablet Take 1 tablet (10 mg total) by mouth daily. 12/18/15   Susy Frizzle, MD  Calcium Carbonate-Vit D-Min (CALCIUM 1200 PO) Take by mouth.    Historical Provider, MD  Cholecalciferol (VITAMIN D3) 2000 units TABS Take by mouth.    Historical Provider, MD  fentaNYL (DURAGESIC - DOSED MCG/HR) 50 MCG/HR Place 50 mcg onto the skin every 3 (three) days.    Historical Provider, MD  hydrochlorothiazide (HYDRODIURIL) 25 MG tablet Take 1 tablet (25 mg total) by mouth daily. 01/15/16   Susy Frizzle, MD  HYDROcodone-acetaminophen (NORCO/VICODIN) 5-325 MG per  tablet Take 1 tablet by mouth as needed.     Historical Provider, MD  insulin lispro (HUMALOG) 100 UNIT/ML injection Inject 0-6 Units into the skin 3 (three) times daily as needed for high blood sugar. Patient uses sliding scale    Historical Provider, MD  insulin NPH (HUMULIN N,NOVOLIN N) 100 UNIT/ML injection Inject 20 Units into the skin every morning.     Historical Provider, MD  levothyroxine (SYNTHROID, LEVOTHROID) 100 MCG tablet Take 1 tablet (100 mcg total) by mouth daily. 12/27/15   Susy Frizzle, MD  levothyroxine (SYNTHROID, LEVOTHROID) 100 MCG tablet TAKE 1 TABLET (100 MCG TOTAL) BY MOUTH DAILY. 01/28/16   Susy Frizzle, MD  loperamide (IMODIUM) 2 MG capsule Take 2 mg by mouth daily.     Historical Provider, MD  memantine (NAMENDA) 10 MG tablet TAKE ONE TABLET BY MOUTH TWICE DAILY 02/14/16   Marcial Pacas, MD  Multiple Vitamin (MULTIVITAMIN) tablet Take 1 tablet by mouth daily.      Historical Provider, MD  ONE TOUCH ULTRA TEST test strip TEST BS 5-6 TIMES A DAY 12/27/15   Susy Frizzle, MD  predniSONE (DELTASONE) 1 MG tablet Take 5 mg by mouth daily.     Historical Provider, MD  QUEtiapine (SEROQUEL) 25 MG tablet Take 2 tablets (50 mg total) by mouth at bedtime. 12/12/15   Marcial Pacas, MD  telmisartan (MICARDIS) 40 MG tablet Take 1 tablet by mouth daily. 02/10/14   Historical Provider, MD  tiotropium (SPIRIVA HANDIHALER) 18 MCG inhalation capsule Place 1 capsule (18 mcg total) into inhaler and inhale daily. 02/22/15 02/02/17  Deneise Lever, MD    Physical Exam:    Filed Vitals:   03/10/16 1301 03/10/16 1345 03/10/16 1400 03/10/16 1430  BP:  144/76 153/96 151/68  Pulse:  94 92 96  Temp: 98.9 F (37.2 C)     TempSrc: Oral     Resp:  18 23 18   Height:      Weight:      SpO2:  97% 98% 94%      Constitutional: NAD, calm, comfortable Filed Vitals:   03/10/16 1301 03/10/16 1345 03/10/16 1400 03/10/16 1430  BP:  144/76 153/96 151/68  Pulse:  94 92 96  Temp: 98.9 F (37.2 C)       TempSrc: Oral     Resp:  18 23 18   Height:      Weight:      SpO2:  97% 98% 94%   Eyes: PERRL, lids and conjunctivae normal ENMT: Mucous membranes are moist. Posterior pharynx clear of any exudate or lesions.Normal dentition.  Neck: normal, supple, no masses, no thyromegaly Respiratory: clear to auscultation bilaterally, no wheezing, no crackles. Normal respiratory effort. No accessory muscle use.  Cardiovascular: Regular rate and rhythm, 2/6 systolic murmurs /  rubs / gallops. No extremity edema. 2+ pedal pulses. No carotid bruits.  Abdomen: no tenderness, no masses palpated. No hepatosplenomegaly. Bowel sounds positive.  Musculoskeletal: no clubbing / cyanosis. No joint deformity upper and lower extremities. Good ROM, no contractures. Normal muscle tone.  Skin: no rashes, lesions, ulcers. No induration Neurologic: CN 2-12 grossly intact. Sensation intact, DTR normal. Strength 5/5 in all 4.  Psychiatric: Normal judgment and insight. Alert and oriented x 3. Normal mood.     Labs on Admission: I have personally reviewed following labs and imaging studies  CBC:  Recent Labs Lab 03/10/16 1052  WBC 16.1*  HGB 10.9*  HCT 32.0*  MCV 90.4  PLT [id number]    Basic Metabolic Panel:  Recent Labs Lab 03/10/16 1052  NA 138  K 4.4  CL 101  CO2 21*  GLUCOSE 86  BUN 52*  CREATININE 2.39*  CALCIUM 10.0    GFR: Estimated Creatinine Clearance: 16.2 mL/min (by C-G formula based on Cr of 2.39).  Liver Function Tests:  Recent Labs Lab 03/10/16 1052  AST 48*  ALT 16  ALKPHOS 51  BILITOT 0.8  PROT 7.3  ALBUMIN 4.7    Recent Labs Lab 03/10/16 1052  LIPASE 22    Urine analysis:    Component Value Date/Time   COLORURINE YELLOW 03/10/2016 1225   APPEARANCEUR CLEAR 03/10/2016 1225   LABSPEC 1.015 03/10/2016 1225   PHURINE 6.0 03/10/2016 1225   GLUCOSEU 250* 03/10/2016 1225   HGBUR TRACE* 03/10/2016 1225   BILIRUBINUR NEGATIVE 03/10/2016 1225   KETONESUR 15* 03/10/2016  1225   PROTEINUR 100* 03/10/2016 1225   UROBILINOGEN 1.0 06/12/2015 1937   NITRITE NEGATIVE 03/10/2016 1225   LEUKOCYTESUR NEGATIVE 03/10/2016 1225    Sepsis Labs: @LABRCNTIP (procalcitonin:4,lacticidven:4) )No results found for this or any previous visit (from the past 240 hour(s)).   Radiological Exams on Admission: Dg Chest 2 View  03/10/2016  CLINICAL DATA:  Cough. Burning sensation during urination. Symptoms for 3 days. Initial encounter. EXAM: CHEST  2 VIEW COMPARISON:  PA and lateral chest 06/12/2015. FINDINGS: The lungs are clear. Heart size is normal. No pneumothorax pleural effusion. No focal bony abnormality. IMPRESSION: Negative chest. Electronically Signed   By: Inge Rise M.D.   On: 03/10/2016 13:17    EKG: Independently reviewed.  Assessment/Plan Principal Problem:   Nausea and vomiting Active Problems:   Diabetes mellitus without complication (HCC)   Polymyalgia rheumatica (HCC)   Hypothyroidism   Normocytic anemia   AKI (acute kidney injury) (HCC)    Intractable nausea and vomiting, total 11 events today Likely due to viral vs bacterial  Gastroenteritis vs diabetic gastroparesis with a neurological component of dysphagia in a patient with Alzheimer's disesase. Nontoxic appearing. Exam benign. No abdominal pain. Received 2 L bolus NS -Admit to med surg -Bowel rest,Advance as tolerated after passing SLP eval -Scheduled Zofran Cautious IV fluids at 50-75 cc/h NS (2 D echo in 2008 EF 55 with LVH, suspected Gr 1 Diast Dysfunction))  Enteric precautions   Type II Diabetes Home regimen includes Current blood sugar level is 86 Lab Results  Component Value Date   HGBA1C 7.6* 07/03/2015  Hgb A1C Hold home oral diabetic medications.   SSI  Acute Kidney Injury in the setting of Volume depletion due to severe vomiting. Current Cr 2.39  Continue  IVF Strict I/O and daily weights  Follow Cr in am.  Hold HCTZ  Renally dose medications.  Leukocytosis, likely  related to dehydration, steroids and pPossible underlying infection.  WBC 16 Cultures pending -  Repeat WBC in AM  Anemia of chronic disease.Hb 10.9 No bleeding issues noted Repeat CBC in am  Hypertension BP 151/68 mmHg  Pulse 96  Temp(Src) 98.9 F (37.2 C) (Oral)  Resp 18  Ht 5\' 2"  (1.575 m)  Wt 63.504 kg (140 lb)  BMI 25.60 kg/m2  SpO2 94% Controlled  Continue home anti-hypertensive medications except HCTZ and [place] until AKI resolves .   Alzheimer's Dementia Continue Namenda  Polymyalgia rheumatica Continue steroids as prescribed outpatient  Hypothyroidism: Chronic.  -Continue home Synthroid    DVT prophylaxis: Heparin  Code Status:   Full   Family Communication:  Discussed with daughter Disposition Plan: Expect patient to be discharged to home Consults called:    None Admission status: Obs  Medical floor   [facility] E, PA-C [facility]   If 7PM-7AM, please contact night-coverage [email or web address] Password [email or web address]  03/10/2016, 3:04 PM

## 2016-03-10 NOTE — ED Notes (Signed)
Pt here from home with c/o nausea and vomiting that started yesterday morning pt has been unable to even keep water or here meds down

## 2016-03-11 LAB — COMPREHENSIVE METABOLIC PANEL
ALK PHOS: 43 U/L (ref 38–126)
ALT: 17 U/L (ref 14–54)
AST: 49 U/L — AB (ref 15–41)
Albumin: 3.6 g/dL (ref 3.5–5.0)
Anion gap: 11 (ref 5–15)
BILIRUBIN TOTAL: 0.8 mg/dL (ref 0.3–1.2)
BUN: 37 mg/dL — AB (ref 6–20)
CALCIUM: 8.7 mg/dL — AB (ref 8.9–10.3)
CHLORIDE: 106 mmol/L (ref 101–111)
CO2: 21 mmol/L — ABNORMAL LOW (ref 22–32)
CREATININE: 1.56 mg/dL — AB (ref 0.44–1.00)
GFR, EST AFRICAN AMERICAN: 35 mL/min — AB (ref >60)
GFR, EST NON AFRICAN AMERICAN: 30 mL/min — AB (ref >60)
Glucose, Bld: 100 mg/dL — ABNORMAL HIGH (ref 65–99)
Potassium: 4.6 mmol/L (ref 3.5–5.1)
Sodium: 138 mmol/L (ref 135–145)
TOTAL PROTEIN: 5.8 g/dL — AB (ref 6.5–8.1)

## 2016-03-11 LAB — BASIC METABOLIC PANEL
Anion gap: 11 (ref 5–15)
BUN: 28 mg/dL — ABNORMAL HIGH (ref 6–20)
CO2: 18 mmol/L — ABNORMAL LOW (ref 22–32)
Calcium: 7.9 mg/dL — ABNORMAL LOW (ref 8.9–10.3)
Chloride: 103 mmol/L (ref 101–111)
Creatinine, Ser: 1.35 mg/dL — ABNORMAL HIGH (ref 0.44–1.00)
GFR calc Af Amer: 41 mL/min — ABNORMAL LOW (ref >60)
GFR calc non Af Amer: 36 mL/min — ABNORMAL LOW (ref >60)
Glucose, Bld: 469 mg/dL — ABNORMAL HIGH (ref 65–99)
Potassium: 4.5 mmol/L (ref 3.5–5.1)
Sodium: 132 mmol/L — ABNORMAL LOW (ref 135–145)

## 2016-03-11 LAB — CBC
HCT: 31 % — ABNORMAL LOW (ref 36.0–46.0)
Hemoglobin: 10.3 g/dL — ABNORMAL LOW (ref 12.0–15.0)
MCH: 30.7 pg (ref 26.0–34.0)
MCHC: 33.2 g/dL (ref 30.0–36.0)
MCV: 92.3 fL (ref 78.0–100.0)
Platelets: 243 10*3/uL (ref 150–400)
RBC: 3.36 MIL/uL — AB (ref 3.87–5.11)
RDW: 14.5 % (ref 11.5–15.5)
WBC: 12.4 10*3/uL — ABNORMAL HIGH (ref 4.0–10.5)

## 2016-03-11 LAB — GLUCOSE, CAPILLARY
GLUCOSE-CAPILLARY: 139 mg/dL — AB (ref 65–99)
GLUCOSE-CAPILLARY: 103 mg/dL — AB (ref 65–99)
GLUCOSE-CAPILLARY: 318 mg/dL — AB (ref 65–99)
GLUCOSE-CAPILLARY: 414 mg/dL — AB (ref 65–99)
Glucose-Capillary: 76 mg/dL (ref 65–99)

## 2016-03-11 LAB — PROTIME-INR
INR: 1.16 (ref 0.00–1.49)
PROTHROMBIN TIME: 15 s (ref 11.6–15.2)

## 2016-03-11 LAB — HEMOGLOBIN A1C
Hgb A1c MFr Bld: 8.4 % — ABNORMAL HIGH (ref 4.8–5.6)
Mean Plasma Glucose: 194 mg/dL

## 2016-03-11 MED ORDER — BOOST / RESOURCE BREEZE PO LIQD
1.0000 | Freq: Three times a day (TID) | ORAL | Status: DC
  Administered 2016-03-11 – 2016-03-12 (×3): 1 via ORAL

## 2016-03-11 MED ORDER — INSULIN ASPART 100 UNIT/ML ~~LOC~~ SOLN
5.0000 [IU] | Freq: Once | SUBCUTANEOUS | Status: AC
  Administered 2016-03-11: 5 [IU] via SUBCUTANEOUS

## 2016-03-11 NOTE — H&P (Signed)
PROGRESS NOTE    Michelle [name]  [id number] DOB: [birth year] DOA: 03/10/2016 PCP: [name], MD  Outpatient Specialists:   Brief Narrative: 82 y.o. female with medical history significant for HTN, CKD, COPD, CAD, Hypothyroididsm, PMR, Alzheimer's Dementia, brought from home due to 1 day history of severe, constant nausea and vomiting. No associated fever, but patient is noted to be cold (?Chronic). No sick contacts. Patient is currently NPO. Problems taking medication (Dysphagia) was documented. Speech evaluation is pending. Patient may need barium swallow with UGI, and further management will depend on the result, however, will await speech evaluation first.   Assessment & Plan:   Principal Problem:   Nausea and vomiting Active Problems:   Diabetes mellitus without complication (HCC)   Polymyalgia rheumatica (HCC)   Hypothyroidism   Normocytic anemia   AKI (acute kidney injury) ([place])   Acute kidney injury ([place])   Diabetes mellitus with complication (HCC)    - Nausea and vomiting - Await speech evaluation. May start clear liquids and gradual advance diet if no significant finding is noted.  -Type II Diabetes - Optimize.  Recent Labs    Lab Results  Component Value Date   HGBA1C 7.6* 07/03/2015     Acute Kidney Injury in the setting of Volume depletion due to severe vomiting - Resolving. Scr is close to baseline. AKI is likely pre renal. Patient has AKI on CKD.  Leukocytosis - possibly reactive, improving. Urine culture as patient has bacteruria. Follow result of blood culture already ordered.  Anemia of chronic disease - Likely multifactorial, and stable.  Hypertension - Optimize.  Alzheimer's Dementia Continue Namenda  Polymyalgia rheumatica Continue steroids as prescribed outpatient  Hypothyroidism: Chronic.  -Continue home Synthroid    DVT prophylaxis: Heparin  Code Status: Full  Family Communication: Discussed with  daughter Disposition Plan: Expect patient to be discharged to home Consults called: None Admission status: Will depend on final state prior to DC.           Subjective: Patient remains NPO. No further nausea or vomiting. No fever. No diarrhea. Awaiting speech evaluation.  Objective: Filed Vitals:   03/10/16 1400 03/10/16 1430 03/10/16 1535 03/11/16 0500  BP: 153/96 151/68 157/68 137/75  Pulse: 92 96 95   Temp:   99.3 F (37.4 C) 99.7 F (37.6 C)  TempSrc:   Oral Oral  Resp: 23 18 18 18   Height:      Weight:      SpO2: 98% 94% 96% 98%    Intake/Output Summary (Last 24 hours) at 03/11/16 0958 Last data filed at 03/11/16 0536  Gross per 24 hour  Intake 2127.5 ml  Output    100 ml  Net 2027.5 ml   Filed Weights   03/10/16 1034  Weight: 63.504 kg (140 lb)    Examination:  General exam: Appears calm and comfortable  Respiratory system: Clear to auscultation. Respiratory effort normal. Cardiovascular system: S1 & S2. No pedal edema. Gastrointestinal system: Abdomen is nondistended, soft and nontender. No organomegaly or masses felt. Normal bowel sounds heard. Central nervous system: Alert. Moves all limbs. Extremities: Symmetric 5 x 5 power.   Data Reviewed:   CBC:  Recent Labs Lab 03/10/16 1052 03/11/16 0228  WBC 16.1* 12.4*  HGB 10.9* 10.3*  HCT 32.0* 31.0*  MCV 90.4 92.3  PLT 309 0000000   Basic Metabolic Panel:  Recent Labs Lab 03/10/16 1052 03/11/16 0228  NA 138 138  K 4.4 4.6  CL 101 106  CO2 21* 21*  GLUCOSE 86 100*  BUN 52* 37*  CREATININE 2.39* 1.56*  CALCIUM 10.0 8.7*   GFR: Estimated Creatinine Clearance: 24.8 mL/min (by C-G formula based on Cr of 1.56). Liver Function Tests:  Recent Labs Lab 03/10/16 1052 03/11/16 0228  AST 48* 49*  ALT 16 17  ALKPHOS 51 43  BILITOT 0.8 0.8  PROT 7.3 5.8*  ALBUMIN 4.7 3.6    Recent Labs Lab 03/10/16 1052  LIPASE 22   No results for input(s): AMMONIA in the last 168  hours. Coagulation Profile:  Recent Labs Lab 03/11/16 0228  INR 1.16   Cardiac Enzymes: No results for input(s): CKTOTAL, CKMB, CKMBINDEX, TROPONINI in the last 168 hours. BNP (last 3 results) No results for input(s): PROBNP in the last 8760 hours. HbA1C:  Recent Labs  03/10/16 1526  HGBA1C 8.4*   CBG:  Recent Labs Lab 03/10/16 1731 03/10/16 2251 03/11/16 0756  GLUCAP 107* 76 139*   Lipid Profile: No results for input(s): CHOL, HDL, LDLCALC, TRIG, CHOLHDL, LDLDIRECT in the last 72 hours. Thyroid Function Tests: No results for input(s): TSH, T4TOTAL, FREET4, T3FREE, THYROIDAB in the last 72 hours. Anemia Panel: No results for input(s): VITAMINB12, FOLATE, FERRITIN, TIBC, IRON, RETICCTPCT in the last 72 hours. Urine analysis:    Component Value Date/Time   COLORURINE YELLOW 03/10/2016 1225   APPEARANCEUR CLEAR 03/10/2016 1225   LABSPEC 1.015 03/10/2016 1225   PHURINE 6.0 03/10/2016 1225   GLUCOSEU 250* 03/10/2016 1225   HGBUR TRACE* 03/10/2016 1225   BILIRUBINUR NEGATIVE 03/10/2016 1225   KETONESUR 15* 03/10/2016 1225   PROTEINUR 100* 03/10/2016 1225   UROBILINOGEN 1.0 06/12/2015 1937   NITRITE NEGATIVE 03/10/2016 1225   LEUKOCYTESUR NEGATIVE 03/10/2016 1225   Sepsis Labs: @LABRCNTIP (procalcitonin:4,lacticidven:4)  )No results found for this or any previous visit (from the past 240 hour(s)).       Radiology Studies: Dg Chest 2 View  03/10/2016  CLINICAL DATA:  Cough. Burning sensation during urination. Symptoms for 3 days. Initial encounter. EXAM: CHEST  2 VIEW COMPARISON:  PA and lateral chest 06/12/2015. FINDINGS: The lungs are clear. Heart size is normal. No pneumothorax pleural effusion. No focal bony abnormality. IMPRESSION: Negative chest. Electronically Signed   By: Inge Rise M.D.   On: 03/10/2016 13:17        Scheduled Meds: . amLODipine  10 mg Oral Daily  . fentaNYL  50 mcg Transdermal Q72H  . heparin  5,000 Units Subcutaneous Q8H   . insulin aspart  0-9 Units Subcutaneous TID WC  . levothyroxine  100 mcg Oral QAC breakfast  . memantine  10 mg Oral BID  . QUEtiapine  50 mg Oral QHS   Continuous Infusions: . sodium chloride 75 mL/hr at 03/11/16 0527     LOS: 1 day    Time spent: [place], MD [facility] Pager [phone].   If 7PM-7AM, please contact night-coverage [email or web address] Password [email or web address] 03/11/2016, 9:58 AM

## 2016-03-11 NOTE — Progress Notes (Signed)
Paged floor coverage TRH for pt's CBG=414 at 2157, pt is asymptomatic alert and VS stable.  K. Schorr responded for a new STAT order for BMP. Phlebotomist made aware.  Just followed up phlebotomist and will be on the floor in a few minutes. Will continue to monitor.

## 2016-03-11 NOTE — Progress Notes (Signed)
Initial Nutrition Assessment  DOCUMENTATION CODES:   Not applicable  INTERVENTION:   -Boost Breeze po TID, each supplement provides 250 kcal and 9 grams of protein  NUTRITION DIAGNOSIS:   Inadequate oral intake related to poor appetite as evidenced by per patient/family report.  GOAL:   Patient will meet greater than or equal to 90% of their needs  MONITOR:   PO intake, Supplement acceptance, Diet advancement, Labs, Weight trends, Skin, I & O's  REASON FOR ASSESSMENT:   Malnutrition Screening Tool    ASSESSMENT:   Michelle [name] is a 82 y.o. female with a Past Medical History of DM, HLD, asthma, COPD, interstitial cystitis, dementia, CAD, HTN, PMR, hypothyroidism who presents with likely viral gastroenteritis with dehydration and AKI.  Pt admitted with viral gastroenteritis and dehydration.   Hx obtained mainly from pt daughter at bedside. She reports pt has experienced a decline in appetite over the past week. She reports that when she tries to feed pt, she occasionally pockets food in her mouth. Prior to a week ago, pt was consuming about two balanced meals daily.   Pt daughter reports UBW of 135#. She does not think pt has lost weight.   Nutrition-Focused physical exam completed. Findings are mild to moderate fat depletion, mild to moderate muscle depletion, and no edema. Per pt daughter, pt was working with PT PTA and "they said her muscle tone was really good". Suspect that mild to moderate fat and muscle depletion is related to advanced age and dehydration.   Per pt daughter, she had been considering trying Ensure and Boost supplements at home PTA. Reviewed SLP note from this AM; pt with mild aspiration risk and was advanced to clear liquids. Pt daughter is amenable to trying Boost Breeze supplements; RD to order.   Labs reviewed: CBGS: 76-107.   Diet Order:  Diet clear liquid Room service appropriate?: Yes; Fluid consistency:: Thin  Skin:  Reviewed, no  issues  Last BM:  03/09/16  Height:   Ht Readings from Last 1 Encounters:  03/10/16 5\' 2"  (1.575 m)    Weight:   Wt Readings from Last 1 Encounters:  03/10/16 140 lb (63.504 kg)    Ideal Body Weight:  50 kg  BMI:  Body mass index is 25.6 kg/(m^2).  Estimated Nutritional Needs:   Kcal:  1400-1600  Protein:  65-75 grams  Fluid:  1.4-1.6 L  EDUCATION NEEDS:   Education needs addressed  Baudelia Schroepfer A. Jimmye Norman, RD, LDN, CDE Pager: [phone] After hours Pager: [phone]

## 2016-03-11 NOTE — Evaluation (Signed)
Clinical/Bedside Swallow Evaluation Patient Details  Name: MCKAY NOTZ MRN: [id number] Date of Birth: August 16, 1932  Today's Date: 03/11/2016 Time: SLP Start Time (ACUTE ONLY): 1014 SLP Stop Time (ACUTE ONLY): 1023 SLP Time Calculation (min) (ACUTE ONLY): 9 min  Past Medical History:  Past Medical History  Diagnosis Date  . Type II or unspecified type diabetes mellitus without mention of complication, not stated as uncontrolled   . Dyslipidemia   . Asthma   . Arthritis   . Gallstones   . COPD (chronic obstructive pulmonary disease) ([place])   . Interstitial cystitis   . UTI (lower urinary tract infection)   . Malaise and fatigue   . Memory loss   . Coronary atherosclerosis   . Systemic hypertension   . Polymyalgia rheumatica ([place])   . Hypothyroidism   . History of fractured kneecap    Past Surgical History:  Past Surgical History  Procedure Laterality Date  . Lumbar disc surgery    . Total abdominal hysterectomy    . Rotator cuff repair    . Tonsillectomy    . Cervical spine surgery    . Cholecystectomy    . Hernia repair    . Cardiac catheterization  01/29/2006    10% luminal irregularity mid LAD  . Cardiac catheterization  03/15/2001    No significant CAD, no evidence of renal artery stenosis, systemic hypertenion  . US echocardiography  09/14/2007    mild LVH, LA mildly dilated,mild mitral annular calcification, AOV mildly sclerotic, aortic root sclerosis/ca+  . Nm myocar perf wall motion  08/17/2009    normal   HPI:  DANISHIA ORAMAS is a 82 y.o. female with medical history significant for HTN, CKD, COPD, CAD, Hypothyroididsm, PMR, Alzheimer's Dementia, brought from home due to 1 day history of severe, constant nausea and vomiting following a recent episode of viral gastroenteritis 2 weeks prior to admission at which time he was accompanied by diarrhea. Symptoms began around 6am on 4/30 with a total of 8-10 episodes of emesis since, although less fequent since presentation.  She has not been able to consume any foods or drink or take any of her meds due to symptoms. She has been experiencing some dysphagia with pills per daughter's report. CXR clear.    Assessment / Plan / Recommendation Clinical Impression  Pt demonstrates normal oral and pharyngeal function subjectively with very limited trials. Pt does not endorse nausea, only states "I dont know". When SLP provided a bite of applesauce, pt did gag a bit, but did not vomit. Did not trial solids yet. There is a possiblity of esophageal dysphagia, but it seems nausea is more likely given complaints. Pts daughter is not present to describe difficulty with pills. Pt and son at bedside have no details to offer. Will initiate a clear liquid diet for now as pt seems to tolerate these well and f/u with daughter via phone when she becomes available.     Aspiration Risk  Mild aspiration risk    Diet Recommendation Thin liquid   Liquid Administration via: Cup;Straw Medication Administration: Whole meds with liquid Supervision: Patient able to self feed Compensations: Slow rate;Small sips/bites Postural Changes: Seated upright at 90 degrees    Other  Recommendations     Follow up Recommendations  24 hour supervision/assistance    Frequency and Duration min 2x/week  1 week       Prognosis Prognosis for Safe Diet Advancement: Good Barriers to Reach Goals: Cognitive deficits      Swallow  Study   General HPI: ANIESHA HINKS is a 82 y.o. female with medical history significant for HTN, CKD, COPD, CAD, Hypothyroididsm, PMR, Alzheimer's Dementia, brought from home due to 1 day history of severe, constant nausea and vomiting following a recent episode of viral gastroenteritis 2 weeks prior to admission at which time he was accompanied by diarrhea. Symptoms began around 6am on 4/30 with a total of 8-10 episodes of emesis since, although less fequent since presentation. She has not been able to consume any foods or drink  or take any of her meds due to symptoms. She has been experiencing some dysphagia with pills per daughter's report. CXR clear.  Type of Study: Bedside Swallow Evaluation Previous Swallow Assessment: none Diet Prior to this Study: NPO Temperature Spikes Noted: No Respiratory Status: Room air History of Recent Intubation: No Behavior/Cognition: Alert;Cooperative;Requires cueing Oral Cavity Assessment: Within Functional Limits Oral Care Completed by SLP: No Oral Cavity - Dentition: Missing dentition Vision: Functional for self-feeding Self-Feeding Abilities: Able to feed self Patient Positioning: Upright in bed Baseline Vocal Quality: Normal Volitional Cough: Strong Volitional Swallow: Able to elicit    Oral/Motor/Sensory Function Overall Oral Motor/Sensory Function: Within functional limits   Ice Chips Ice chips: Within functional limits   Thin Liquid Thin Liquid: Within functional limits Presentation: Straw;Cup;Self Fed    Nectar Thick Nectar Thick Liquid: Not tested   Honey Thick Honey Thick Liquid: Not tested   Puree Puree: Impaired Presentation: Spoon Other Comments: gag   Solid   GO   Solid: Not tested       Herbie Baltimore, MA [name] [phone]  Colie Fugitt, Katherene Ponto 03/11/2016,10:31 AM

## 2016-03-12 DIAGNOSIS — R531 Weakness: Secondary | ICD-10-CM

## 2016-03-12 LAB — GLUCOSE, CAPILLARY
GLUCOSE-CAPILLARY: 328 mg/dL — AB (ref 65–99)
GLUCOSE-CAPILLARY: 394 mg/dL — AB (ref 65–99)
GLUCOSE-CAPILLARY: 381 mg/dL — AB (ref 65–99)
GLUCOSE-CAPILLARY: 212 mg/dL — AB (ref 65–99)
Glucose-Capillary: 314 mg/dL — ABNORMAL HIGH (ref 65–99)
Glucose-Capillary: 412 mg/dL — ABNORMAL HIGH (ref 65–99)

## 2016-03-12 LAB — RENAL FUNCTION PANEL
Albumin: 3.3 g/dL — ABNORMAL LOW (ref 3.5–5.0)
Anion gap: 10 (ref 5–15)
BUN: 23 mg/dL — ABNORMAL HIGH (ref 6–20)
CO2: 18 mmol/L — ABNORMAL LOW (ref 22–32)
Calcium: 8.1 mg/dL — ABNORMAL LOW (ref 8.9–10.3)
Chloride: 107 mmol/L (ref 101–111)
Creatinine, Ser: 1.38 mg/dL — ABNORMAL HIGH (ref 0.44–1.00)
GFR calc Af Amer: 40 mL/min — ABNORMAL LOW (ref >60)
GFR calc non Af Amer: 35 mL/min — ABNORMAL LOW (ref >60)
Glucose, Bld: 352 mg/dL — ABNORMAL HIGH (ref 65–99)
Phosphorus: 1.8 mg/dL — ABNORMAL LOW (ref 2.5–4.6)
Potassium: 4 mmol/L (ref 3.5–5.1)
Sodium: 135 mmol/L (ref 135–145)

## 2016-03-12 LAB — CBC WITH DIFFERENTIAL/PLATELET
Basophils Absolute: 0 10*3/uL (ref 0.0–0.1)
Basophils Relative: 0 %
Eosinophils Absolute: 0.1 10*3/uL (ref 0.0–0.7)
Eosinophils Relative: 1 %
HCT: 31.2 % — ABNORMAL LOW (ref 36.0–46.0)
Hemoglobin: 10.3 g/dL — ABNORMAL LOW (ref 12.0–15.0)
Lymphocytes Relative: 10 %
Lymphs Abs: 1.1 10*3/uL (ref 0.7–4.0)
MCH: 31.6 pg (ref 26.0–34.0)
MCHC: 33 g/dL (ref 30.0–36.0)
MCV: 95.7 fL (ref 78.0–100.0)
Monocytes Absolute: 1.1 10*3/uL — ABNORMAL HIGH (ref 0.1–1.0)
Monocytes Relative: 10 %
Neutro Abs: 8.2 10*3/uL — ABNORMAL HIGH (ref 1.7–7.7)
Neutrophils Relative %: 79 %
Platelets: 235 10*3/uL (ref 150–400)
RBC: 3.26 MIL/uL — ABNORMAL LOW (ref 3.87–5.11)
RDW: 14.4 % (ref 11.5–15.5)
WBC: 10.5 10*3/uL (ref 4.0–10.5)

## 2016-03-12 LAB — SEDIMENTATION RATE: Sed Rate: 40 mm/hr — ABNORMAL HIGH (ref 0–22)

## 2016-03-12 MED ORDER — IPRATROPIUM-ALBUTEROL 0.5-2.5 (3) MG/3ML IN SOLN
RESPIRATORY_TRACT | Status: AC
  Filled 2016-03-12: qty 3

## 2016-03-12 MED ORDER — ALBUTEROL SULFATE (2.5 MG/3ML) 0.083% IN NEBU
2.5000 mg | INHALATION_SOLUTION | RESPIRATORY_TRACT | Status: DC | PRN
  Administered 2016-03-13: 2.5 mg via RESPIRATORY_TRACT
  Filled 2016-03-12: qty 3

## 2016-03-12 MED ORDER — IPRATROPIUM-ALBUTEROL 0.5-2.5 (3) MG/3ML IN SOLN
3.0000 mL | Freq: Four times a day (QID) | RESPIRATORY_TRACT | Status: DC
  Administered 2016-03-12 (×2): 3 mL via RESPIRATORY_TRACT
  Filled 2016-03-12: qty 3

## 2016-03-12 NOTE — Progress Notes (Signed)
5 Units Novolog given at 2339 per new order. Recheck CBG=412 at 0043,. Patient asleep but response to voice.  Earlier CBG from [facility] at 2248 is 469.  Floor coverage MD made aware.

## 2016-03-12 NOTE — Progress Notes (Signed)
K. Schorr called back inquiring the possible causes of CBG elevation.  This RN confirmed that pt. had Breeze Boost x 2 (1258 and 1957) and saw an empty can of 222 mL regular ginger ale.  K. Schorr order to recheck CBG at 0230.

## 2016-03-12 NOTE — Care Management Important Message (Signed)
Important Message  Patient Details  Name: Michelle [name] MRN: [id number] Date of Birth: [birth year]   Medicare Important Message Given:  Yes    [name] Abena 03/12/2016, 12:06 PM

## 2016-03-12 NOTE — Progress Notes (Signed)
Speech Language Pathology Treatment: Dysphagia  Patient Details Name: MAITLAND MUHLBAUER MRN: [id number] DOB: [birth year] Today's Date: 03/12/2016 Time: 1100-1130 SLP Time Calculation (min) (ACUTE ONLY): 30 min  Assessment / Plan / Recommendation Clinical Impression  Pt seen today with daughter at bedside who explained pt had been orally holding pills and solids, with prolonged mastication and taking very long to consume meals. SLP explained how dementia can impact eating and drinking including sensory deficits, awareness and initiation. Sat pt edge of bed with verbal cues and min assist. Demosntrated and explained compensatory strategies and precautions including self feeding, appropriate posture, liquid wash, texture modification, tactile cues, and pill management. Also discussed progression of dementia and possibility in the future that pts function could further decline. Daughter verbalized understanding. Will upgrade diet to dys 3 mechanical soft diet with thin liquids. No f/u needed, will sign off.    HPI HPI: LAJOY VANAMBURG is a 82 y.o. female with medical history significant for HTN, CKD, COPD, CAD, Hypothyroididsm, PMR, Alzheimer's Dementia, brought from home due to 1 day history of severe, constant nausea and vomiting following a recent episode of viral gastroenteritis 2 weeks prior to admission at which time he was accompanied by diarrhea. Symptoms began around 6am on 4/30 with a total of 8-10 episodes of emesis since, although less fequent since presentation. She has not been able to consume any foods or drink or take any of her meds due to symptoms. She has been experiencing some dysphagia with pills per daughter's report. CXR clear.       SLP Plan  All goals met     Recommendations  Diet recommendations: Dysphagia 3 (mechanical soft);Thin liquid Liquids provided via: Cup;Straw Medication Administration: Crushed with puree Supervision: Intermittent supervision to cue for compensatory  strategies;Staff to assist with self feeding Compensations: Slow rate;Small sips/bites Postural Changes and/or Swallow Maneuvers: Seated upright 90 degrees             Plan: All goals met     GO               Herbie Baltimore, MA [name] [phone]  Lynann Beaver 03/12/2016, 11:37 AM

## 2016-03-12 NOTE — Evaluation (Signed)
Occupational Therapy Evaluation Patient Details Name: Michelle [name] MRN: [id number] DOB: 1932/01/04 Today's Date: 03/12/2016    History of Present Illness 82 y.o. female with medical history significant for HTN, CKD, COPD, CAD, Hypothyroididsm, PMR, Alzheimer's Dementia, brought from home due to 1 day history of severe, constant nausea and vomiting   Clinical Impression   This 82 yo female admitted with above presents to acute OT with deficits below (see OT problem list) thus affecting her PLOF of S for all basic ADLs and daughter doing all IADLs. She will benefit from acute OT with follow up OT at SNF.    Follow Up Recommendations  SNF;Other (comment) (if pt and daughter decline then HHOT (they were "on the fence"))    Equipment Recommendations  None recommended by OT       Precautions / Restrictions Precautions Precautions: Fall Precaution Comments: DOE--per daughter pt has COPD and has not gotten her inhaler since she got here Restrictions Weight Bearing Restrictions: No      Mobility Bed Mobility Overal bed mobility: Needs Assistance Bed Mobility: Supine to Sit;Sit to Supine     Supine to sit: Supervision (HOB flat and no rail) Sit to supine: Supervision (HOB flat and no rail)      Transfers Overall transfer level: Needs assistance Equipment used: Rolling walker (2 wheeled) Transfers: Sit to/from Stand Sit to Stand: Min guard              Balance Overall balance assessment: Needs assistance Sitting-balance support: No upper extremity supported;Feet supported Sitting balance-Leahy Scale: Good     Standing balance support: Bilateral upper extremity supported Standing balance-Leahy Scale: Poor Standing balance comment: reliant on RW                            ADL Overall ADL's : Needs assistance/impaired Eating/Feeding: Independent;Sitting   Grooming: Wash/dry hands;Min guard;Standing   Upper Body Bathing: Set up;Supervision/  safety;Sitting   Lower Body Bathing: Supervison/ safety;Set up (min guard A sit<>stand)   Upper Body Dressing : Set up;Supervision/safety;Sitting   Lower Body Dressing: Set up;Supervision/safety (min guard A sit<>stand)   Toilet Transfer: Min guard;Ambulation;Regular Toilet;Grab bars;Comfort height toilet   Toileting- Clothing Manipulation and Hygiene: Min guard;Sit to/from stand                         Pertinent Vitals/Pain Pain Assessment: No/denies pain     Hand Dominance Right   Extremity/Trunk Assessment Upper Extremity Assessment Upper Extremity Assessment: Generalized weakness   Lower Extremity Assessment Lower Extremity Assessment: Defer to PT evaluation       Communication Communication Communication: No difficulties   Cognition Arousal/Alertness: Awake/alert Behavior During Therapy: WFL for tasks assessed/performed Overall Cognitive Status: Within Functional Limits for tasks assessed                                Home Living   Living Arrangements: Alone Available Help at Discharge: Family;Available 24 hours/day (but usually not there at night) Type of Home: House Home Access: Stairs to enter     Home Layout: One level     Bathroom Shower/Tub: Occupational psychologist: Standard     Home Equipment: Environmental consultant - 2 wheels;Cane - single point;Shower seat;Bedside commode          Prior Functioning/Environment Level of Independence: Independent with assistive device(s)  Comments: Uses RW at home; does note cook or drive. Daughter normally stays there with her during the day but leaves once she gets her in bed and then arrives before pt gets out of bed    OT Diagnosis: Generalized weakness   OT Problem List: Decreased strength;Decreased activity tolerance;Impaired balance (sitting and/or standing);Cardiopulmonary status limiting activity   OT Treatment/Interventions: Self-care/ADL training;Patient/family  education;Balance training;DME and/or AE instruction    OT Goals(Current goals can be found in the care plan section) Acute Rehab OT Goals Patient Stated Goal: to get stronger OT Goal Formulation: With patient/family Time For Goal Achievement: 03/19/16 Potential to Achieve Goals: Good  OT Frequency: Min 2X/week              End of Session Equipment Utilized During Treatment: Gait belt;Rolling walker Nurse Communication: Mobility status (NT also to try and ambulate her out door and back with +1 A and use of belt; have her ambulate to toliet not use BSC)  Activity Tolerance:  (feels week and DOE) Patient left: in bed;with call bell/phone within reach   Time: 1519-1550 OT Time Calculation (min): 31 min Charges:  OT General Charges $OT Visit: 1 Procedure OT Evaluation $OT Eval Moderate Complexity: 1 Procedure OT Treatments $Self Care/Home Management : 8-22 mins  Almon Register [id number] 03/12/2016, 4:03 PM

## 2016-03-12 NOTE — Progress Notes (Signed)
At 0235, CBG=314.  Patient asleep but easily arousable.  Floor coverage MD made aware.  Will monitor closely.

## 2016-03-12 NOTE — H&P (Signed)
PROGRESS NOTE    Michelle [name]  [id number] DOB: 09-Nov-1932 DOA: 03/10/2016 PCP: [name], MD  Outpatient Specialists:   Brief Narrative: 82 y.o. female with medical history significant for HTN, CKD, COPD, CAD, Hypothyroididsm, PMR, Alzheimer's Dementia, brought from home due to 1 day history of severe, constant nausea and vomiting. No associated fever, but patient is noted to be cold (?Chronic). No sick contacts. Patient has tolerated clear liquid diet. Will advance diet to consistent carbohydrate diet. Speech evaluation is appreciated (Please see documentation). Patient reports feeling weak. Will consult PT/OT to evaluate patient prior to discharge. Apparently, patient was also feeling weak at home.   Assessment & Plan:   Principal Problem:   Nausea and vomiting Active Problems:   Diabetes mellitus without complication (HCC)   Polymyalgia rheumatica (HCC)   Hypothyroidism   Normocytic anemia   AKI (acute kidney injury) ([place])   Acute kidney injury ([place])   Diabetes mellitus with complication (HCC)    - Nausea and vomiting - Resolved. Advance diet (see above).  -Type II Diabetes - Optimize.  Recent Labs    Lab Results  Component Value Date   HGBA1C 7.6* 07/03/2015     Acute Kidney Injury in the setting of Volume depletion due to severe vomiting - Resolved significantly. Leukocytosis - Likely reactive, resolved.   Anemia of chronic disease - Likely multifactorial, and stable. Hypertension - Optimize. Alzheimer's Dementia Continue [place] Weakness - PT/OT prior to DC. Will check ESR as patient has history of PMR. Polymyalgia rheumatica Continue steroids as prescribed outpatient  Hypothyroidism: Chronic.  -Continue home Synthroid    DVT prophylaxis: Heparin  Code Status: Full  Family Communication: Discussed with daughter Disposition Plan: Expect patient to be discharged to home Consults called: None Admission status: Will depend on  final state prior to DC.           Subjective: Reports feeling weak. Nausea and vomiting have resolved.  Objective: Filed Vitals:   03/11/16 1240 03/11/16 2157 03/12/16 0445 03/12/16 1402  BP: 144/72 122/56 132/64 129/59  Pulse: 89 86 82 81  Temp: 97.6 F (36.4 C) 99.1 F (37.3 C) 99.9 F (37.7 C) 99.5 F (37.5 C)  TempSrc: Oral Oral Oral Oral  Resp: '18 17 17 16  ' Height:      Weight:      SpO2: 97% 93% 91% 90%    Intake/Output Summary (Last 24 hours) at 03/12/16 1521 Last data filed at 03/12/16 0854  Gross per 24 hour  Intake 2739.5 ml  Output      0 ml  Net 2739.5 ml   Filed Weights   03/10/16 1034  Weight: 63.504 kg (140 lb)    Examination:  General exam: Appears calm and comfortable  Respiratory system: Clear to auscultation. Respiratory effort normal. Cardiovascular system: S1 & S2. No pedal edema. Gastrointestinal system: Abdomen is nondistended, soft and nontender. No organomegaly or masses felt. Normal bowel sounds heard. Central nervous system: Alert. Moves all limbs. Extremities: Symmetric 5 x 5 power.   Data Reviewed:   CBC:  Recent Labs Lab 03/10/16 1052 03/11/16 0228 03/12/16 1147  WBC 16.1* 12.4* 10.5  NEUTROABS  --   --  8.2*  HGB 10.9* 10.3* 10.3*  HCT 32.0* 31.0* 31.2*  MCV 90.4 92.3 95.7  PLT 309 243 546   Basic Metabolic Panel:  Recent Labs Lab 03/10/16 1052 03/11/16 0228 03/11/16 2248 03/12/16 1147  NA 138 138 132* 135  K 4.4 4.6 4.5 4.0  CL 101 106  103 107  CO2 21* 21* 18* 18*  GLUCOSE 86 100* 469* 352*  BUN 52* 37* 28* 23*  CREATININE 2.39* 1.56* 1.35* 1.38*  CALCIUM 10.0 8.7* 7.9* 8.1*  PHOS  --   --   --  1.8*   GFR: Estimated Creatinine Clearance: 28 mL/min (by C-G formula based on Cr of 1.38). Liver Function Tests:  Recent Labs Lab 03/10/16 1052 03/11/16 0228 03/12/16 1147  AST 48* 49*  --   ALT 16 17  --   ALKPHOS 51 43  --   BILITOT 0.8 0.8  --   PROT 7.3 5.8*  --   ALBUMIN 4.7 3.6 3.3*     Recent Labs Lab 03/10/16 1052  LIPASE 22   No results for input(s): AMMONIA in the last 168 hours. Coagulation Profile:  Recent Labs Lab 03/11/16 0228  INR 1.16   Cardiac Enzymes: No results for input(s): CKTOTAL, CKMB, CKMBINDEX, TROPONINI in the last 168 hours. BNP (last 3 results) No results for input(s): PROBNP in the last 8760 hours. HbA1C:  Recent Labs  03/10/16 1526  HGBA1C 8.4*   CBG:  Recent Labs Lab 03/11/16 2158 03/12/16 0040 03/12/16 0235 03/12/16 0740 03/12/16 1315  GLUCAP 414* 412* 314* 328* 394*   Lipid Profile: No results for input(s): CHOL, HDL, LDLCALC, TRIG, CHOLHDL, LDLDIRECT in the last 72 hours. Thyroid Function Tests: No results for input(s): TSH, T4TOTAL, FREET4, T3FREE, THYROIDAB in the last 72 hours. Anemia Panel: No results for input(s): VITAMINB12, FOLATE, FERRITIN, TIBC, IRON, RETICCTPCT in the last 72 hours. Urine analysis:    Component Value Date/Time   COLORURINE YELLOW 03/10/2016 1225   APPEARANCEUR CLEAR 03/10/2016 1225   LABSPEC 1.015 03/10/2016 1225   PHURINE 6.0 03/10/2016 1225   GLUCOSEU 250* 03/10/2016 1225   HGBUR TRACE* 03/10/2016 1225   BILIRUBINUR NEGATIVE 03/10/2016 1225   KETONESUR 15* 03/10/2016 1225   PROTEINUR 100* 03/10/2016 1225   UROBILINOGEN 1.0 06/12/2015 1937   NITRITE NEGATIVE 03/10/2016 1225   LEUKOCYTESUR NEGATIVE 03/10/2016 1225   Sepsis Labs: '@LABRCNTIP' (procalcitonin:4,lacticidven:4)  ) Recent Results (from the past 240 hour(s))  Blood culture (routine x 2)     Status: None (Preliminary result)   Collection Time: 03/10/16 12:04 PM  Result Value Ref Range Status   Specimen Description BLOOD WRIST RIGHT  Final   Special Requests BOTTLES DRAWN AEROBIC ONLY 5CC  Final   Culture NO GROWTH < 24 HOURS  Final   Report Status PENDING  Incomplete  Blood culture (routine x 2)     Status: None (Preliminary result)   Collection Time: 03/10/16 12:07 PM  Result Value Ref Range Status   Specimen  Description BLOOD RIGHT ANTECUBITAL  Final   Special Requests BOTTLES DRAWN AEROBIC AND ANAEROBIC 5CC  Final   Culture NO GROWTH < 24 HOURS  Final   Report Status PENDING  Incomplete         Radiology Studies: No results found.      Scheduled Meds: . amLODipine  10 mg Oral Daily  . feeding supplement  1 Container Oral TID BM  . fentaNYL  50 mcg Transdermal Q72H  . heparin  5,000 Units Subcutaneous Q8H  . insulin aspart  0-9 Units Subcutaneous TID WC  . levothyroxine  100 mcg Oral QAC breakfast  . memantine  10 mg Oral BID  . QUEtiapine  50 mg Oral QHS   Continuous Infusions: . sodium chloride 75 mL/hr at 03/12/16 0450     LOS: 2 days  Time spent: [place], MD [facility] Pager [phone].   If 7PM-7AM, please contact night-coverage [email or web address] Password [email or web address] 03/12/2016, 3:21 PM

## 2016-03-12 NOTE — Progress Notes (Signed)
Inpatient Diabetes Program Recommendations  AACE/ADA: New Consensus Statement on Inpatient Glycemic Control (2015)  Target Ranges:  Prepandial:   less than 140 mg/dL      Peak postprandial:   less than 180 mg/dL (1-2 hours)      Critically ill patients:  140 - 180 mg/dL   Results for SELAM, ATEHORTUA (MRN [id number]) as of 03/12/2016 08:30  Ref. Range 03/11/2016 16:45 03/11/2016 21:58 03/12/2016 00:40 03/12/2016 02:35 03/12/2016 07:40  Glucose-Capillary Latest Ref Range: 65-99 mg/dL 318 (H) 414 (H) 412 (H) 314 (H) 328 (H)   Review of Glycemic Control  Diabetes history: DM 2 Outpatient Diabetes medications: NPH 18 units, Humalog 0-6 units TID Current orders for Inpatient glycemic control: Novolog Sensitive TID  Inpatient Diabetes Program Recommendations: Insulin - Basal: Patient takes NPH at home. Glucose is running in the 300's. While inpatient, please order most of home NPH, NPH 15 units Daily.  Thanks,  Tama Headings RN, MSN, [facility] Inpatient Diabetes Coordinator Team Pager [phone] (8a-5p)

## 2016-03-13 ENCOUNTER — Encounter (HOSPITAL_COMMUNITY): Payer: Self-pay | Admitting: *Deleted

## 2016-03-13 DIAGNOSIS — E869 Volume depletion, unspecified: Secondary | ICD-10-CM

## 2016-03-13 LAB — GLUCOSE, CAPILLARY
Glucose-Capillary: 364 mg/dL — ABNORMAL HIGH (ref 65–99)
Glucose-Capillary: 215 mg/dL — ABNORMAL HIGH (ref 65–99)

## 2016-03-13 MED ORDER — INSULIN GLARGINE 100 UNIT/ML ~~LOC~~ SOLN
20.0000 [IU] | Freq: Every day | SUBCUTANEOUS | Status: DC
  Administered 2016-03-13: 20 [IU] via SUBCUTANEOUS
  Filled 2016-03-13: qty 0.2

## 2016-03-13 MED ORDER — GLUCERNA SHAKE PO LIQD
237.0000 mL | Freq: Two times a day (BID) | ORAL | Status: DC
Start: 2016-03-13 — End: 2016-04-23

## 2016-03-13 MED ORDER — GLUCERNA SHAKE PO LIQD
237.0000 mL | Freq: Two times a day (BID) | ORAL | Status: DC
  Administered 2016-03-13: 237 mL via ORAL

## 2016-03-13 NOTE — Care Management Note (Addendum)
Case Management Note  Patient Details  Name: Michelle [name] MRN: [id number] Date of Birth: 09-25-1932  Subjective/Objective:                    Action/Plan:  Spoke with patient and daughter Rainey Pines [phone] at bedside . Ms Sherlynn Stalls does want to take her mother home at discharge with home health . Ms Sherlynn Stalls can provide assistance at home. Confirmed face sheet information.  MS Wyrick requesting home health to call her [phone] to arrange visits . Choice provided , they would like [place] .   Paged MD for orders. Received orders for home health PT/OT/RN/aide /SW. Patient has walker at home already . Expected Discharge Date:                  Expected Discharge Plan:  [place]  In-House Referral:     Discharge planning Services  CM Consult  Post Acute Care Choice:  Home Health Choice offered to:  Patient, Adult Children  DME Arranged:    DME Agency:     HH Arranged:    HH Agency:  [facility]  Status of Service:  In process, will continue to follow  Medicare Important Message Given:  Yes Date Medicare IM Given:    Medicare IM give by:    Date Additional Medicare IM Given:    Additional Medicare Important Message give by:     If discussed at [place] of Stay Meetings, dates discussed:    Additional Comments:  Marilu Favre, RN 03/13/2016, 11:04 AM

## 2016-03-13 NOTE — Discharge Summary (Signed)
Physician Discharge Summary  Patient ID: Michelle [name] MRN: [id number] DOB/AGE: [birth year] 82 y.o.  Admit date: 03/10/2016 Discharge date: 03/13/2016  Admission Diagnoses:  Discharge Diagnoses:  Principal Problem:   Nausea and vomiting Active Problems:   Diabetes mellitus without complication (HCC)   Polymyalgia rheumatica (HCC)   Hypothyroidism   Normocytic anemia   AKI (acute kidney injury) ([place])   Acute kidney injury ([place])   Diabetes mellitus with complication ([place])   Discharged Condition: stable  Hospital Course: 82 y.o. female with medical history significant for HTN, CKD, COPD, CAD, Hypothyroididsm, PMR, Alzheimer's Dementia, brought from home due to 1 day history of severe, constant nausea and vomiting. No associated fever, but patient is noted to be cold chronically. No sick contacts. Patient's daughter also reported that patient had problems swallowing pills. Associated weakness and fatigue. Patient was admitted for further assessment and management. Nausea and vomiting were managed supportively. Nausea and vomiting have resolved,. Speech therapy was consulted to assess patient and mechanical soft diet has been advised. It was also advised that medication should be crushed with puree and Carer to assist with self feeding. Patient will be discharged back home when cleared by speech therapy team. PT/OT was consulted to assess patient. OT recommended SNF placement but patient's daughter has elected to take patient home with support. Risks of aspiration discussed with patient's daughter extensively.   Consults: Speech evaluation.  Significant Diagnostic Studies:    Discharge Medication - Please see the Med. Rec. Treatments:   Discharge Exam: Blood pressure 130/58, pulse 76, temperature 98.6 F (37 C), temperature source Oral, resp. rate 16, height 5\' 2"  (1.575 m), weight 63.504 kg (140 lb), SpO2 97 %.   Disposition: 06-Home-Health Care Svc  Discharge Instructions    Activity  as tolerated - No restrictions    Complete by:  As directed   Activity as per PT/OT     Call MD for:    Complete by:  As directed   For worsening symptoms including aspiration     Diet Carb Modified    Complete by:  As directed   Mechanical soft diet (Kindly see speech evaluation)     Diet Carb Modified    Complete by:  As directed   Dysphagia 3 (mechanical soft);Thin liquid Liquids provided via: Cup;Straw Medication Administration: Crushed with puree Supervision: Intermittent supervision to cue for compensatory strategies; Carer to assist with self feeding Compensations: Slow rate;Small sips/bites Postural Changes and/or Swallow Maneuvers: Seated upright 90 degrees     Increase activity slowly    Complete by:  As directed   Activity as per Home PT/OT            Medication List    STOP taking these medications        beta carotene w/minerals tablet     dimenhyDRINATE 50 MG tablet  Commonly known as:  DRAMAMINE     diphenhydramine-acetaminophen 25-500 MG Tabs tablet  Commonly known as:  TYLENOL PM     hydrochlorothiazide 25 MG tablet  Commonly known as:  HYDRODIURIL     loperamide 2 MG capsule  Commonly known as:  IMODIUM     multivitamin tablet     Potassium 99 MG Tabs     predniSONE 1 MG tablet  Commonly known as:  DELTASONE     predniSONE 5 MG tablet  Commonly known as:  DELTASONE     PRESERVISION AREDS PO     telmisartan 40 MG tablet  Commonly known as:  MICARDIS  traMADol 50 MG tablet  Commonly known as:  ULTRAM      TAKE these medications        amLODipine 10 MG tablet  Commonly known as:  NORVASC  Take 1 tablet (10 mg total) by mouth daily.     aspirin 81 MG tablet  Take 81 mg by mouth daily.     CALCIUM 1200 PO  Take 1 tablet by mouth daily.     feeding supplement (GLUCERNA SHAKE) Liqd  Take 237 mLs by mouth 2 (two) times daily between meals.     fentaNYL 50 MCG/HR  Commonly known as:  DURAGESIC - dosed mcg/hr  Place 50 mcg onto  the skin every 3 (three) days.     HYDROcodone-acetaminophen 5-325 MG tablet  Commonly known as:  NORCO/VICODIN  Take 1 tablet by mouth as needed.     insulin lispro 100 UNIT/ML injection  Commonly known as:  HUMALOG  Inject 0-6 Units into the skin 3 (three) times daily as needed for high blood sugar. Patient uses sliding scale     insulin NPH Human 100 UNIT/ML injection  Commonly known as:  HUMULIN N,NOVOLIN N  Inject 18 Units into the skin every morning.     levothyroxine 100 MCG tablet  Commonly known as:  SYNTHROID, LEVOTHROID  TAKE 1 TABLET (100 MCG TOTAL) BY MOUTH DAILY.     memantine 10 MG tablet  Commonly known as:  NAMENDA  TAKE ONE TABLET BY MOUTH TWICE DAILY     ONE TOUCH ULTRA TEST test strip  Generic drug:  glucose blood  TEST BS 5-6 TIMES A DAY     QUEtiapine 25 MG tablet  Commonly known as:  SEROQUEL  Take 2 tablets (50 mg total) by mouth at bedtime.     tiotropium 18 MCG inhalation capsule  Commonly known as:  SPIRIVA HANDIHALER  Place 1 capsule (18 mcg total) into inhaler and inhale daily.     Vitamin D3 2000 units Tabs  Take by mouth.           Follow-up Information    Follow up with [name], MD.   Specialty:  [facility]   Contact information:   [address] [place] [address] [phone]       Signed: Bonnell Public 03/13/2016, 1:40 PM

## 2016-03-13 NOTE — Progress Notes (Signed)
Nutrition Follow-up  DOCUMENTATION CODES:   Not applicable  INTERVENTION:   -D/c Boost Breeze po TID, each supplement provides 250 kcal and 9 grams of protein -Glucerna Shake po BID, each supplement provides 220 kcal and 10 grams of protein  NUTRITION DIAGNOSIS:   Inadequate oral intake Michelle [name] to poor Michelle [name] as evidenced by per patient/family report.  Progressing  GOAL:   Patient will meet greater than or equal to 90% of their needs  Progressing  MONITOR:   PO intake, Supplement acceptance, Diet advancement, Labs, Weight trends, Skin, I & O's  REASON FOR ASSESSMENT:   Malnutrition Screening Tool    ASSESSMENT:   Michelle Michelle [name] is a 82 y.o. female with a Past Medical History of DM, HLD, asthma, COPD, interstitial cystitis, dementia, CAD, HTN, PMR, hypothyroidism who presents with likely viral gastroenteritis with dehydration and AKI.  Case discussed with RN. She reports that Michelle Michelle [name] continues to be poor; she ate a few bites at breakfast this morning. She also reports she trialed a Glucerna shake with Michelle Michelle [name] to diet advancement and concern for high blood sugars.   Spoke with Michelle Michelle [name] and Michelle [name] at bedside. Noted that SLP visited on 03/12/16 and advanced diet to dysphagia 3 diet with thin liquids. Michelle Michelle [name] reports Michelle Michelle [name] is doing well with liquids, however, solid foods continue to become stuck in her throat (such as eggs). Michelle Michelle [name] reports she will address with MD prior to discharge.   Noted Michelle Michelle [name] consumed about 50% of Glucerna supplement. Both Michelle Michelle [name] and Michelle Michelle [name] are amenable to continuing supplement.   Labs reviewed: CBGS: [id number].   Diet Order:  DIET DYS 3 Room service appropriate?: Yes; Fluid consistency:: Thin Diet Carb Modified Diet Carb Modified  Skin:  Reviewed, no issues  Last BM:  03/09/16  Height:   Ht Readings from Last 1 Encounters:  03/10/16 5\' 2"  (1.575 m)    Weight:   Wt Readings from Last 1 Encounters:  03/10/16 140 lb (63.504 kg)     Ideal Body Weight:  50 kg  BMI:  Body mass index is 25.6 kg/(m^2).  Estimated Nutritional Needs:   Kcal:  1400-1600  Protein:  65-75 grams  Fluid:  1.4-1.6 L  EDUCATION NEEDS:   Education needs addressed  Michelle Michelle [name] A. Jimmye Norman, RD, LDN, CDE Pager: [phone] After hours Pager: [phone]

## 2016-03-13 NOTE — Progress Notes (Signed)
Patient discharged to home with instructions. 

## 2016-03-13 NOTE — Progress Notes (Signed)
Provided further education to pt regarding oral dysphagia with dementia and softening and moistening textures to facilitate intake. Daughter will plan to take pt home and offer soft, ground moist foods of choice and supplement with liquids. Can consider home health SLP referral if pt struggles with solid textures into the long term. Herbie Baltimore, [place] [name] [phone]

## 2016-03-13 NOTE — Evaluation (Signed)
Physical Therapy Evaluation Patient Details Name: Michelle [name] MRN: [id number] DOB: Jun 16, 1932 Today's Date: 03/13/2016   History of Present Illness  82 y.o. female with medical history significant for HTN, CKD, COPD, CAD, Hypothyroididsm, PMR, Alzheimer's Dementia, brought from home due to 1 day history of severe, constant nausea and vomiting  Clinical Impression  Pt pleasant and willing to work with therapy. Daughter present and helpful with providing home function. Pt's daughter stays with pt throughout the day, and leaves at night once pt is in bed. Pt with decreased activity tolerance and balance, and would benefit from therapy to increase strength and ROM to decrease caregiver burden and improve quality of life. Daughter reported that pt would like to return home, and so recommended HHPT.    Follow Up Recommendations Home health PT;Supervision for mobility/OOB    Equipment Recommendations  None recommended by PT    Recommendations for Other Services       Precautions / Restrictions Precautions Precautions: Fall Restrictions Weight Bearing Restrictions: No      Mobility  Bed Mobility               General bed mobility comments: pt in chair on arrival  Transfers Overall transfer level: Needs assistance Equipment used: None Transfers: Sit to/from Stand Sit to Stand: Min guard         General transfer comment: min guard for safety. cues for backing up all the way to chair before sitting.  Ambulation/Gait Ambulation/Gait assistance: Min guard Ambulation Distance (Feet): 100 Feet Assistive device: Rolling walker (2 wheeled) Gait Pattern/deviations: Trunk flexed;Step-through pattern   Gait velocity interpretation: Below normal speed for age/gender General Gait Details: cues for position in walker and posture  Stairs            Wheelchair Mobility    Modified Rankin (Stroke Patients Only)       Balance Overall balance assessment: Needs  assistance   Sitting balance-Leahy Scale: Good       Standing balance-Leahy Scale: Poor                               Pertinent Vitals/Pain Pain Assessment: No/denies pain    Home Living Family/patient expects to be discharged to:: Private residence Living Arrangements: Alone Available Help at Discharge: Family;Available 24 hours/day (but usually not there at night) Type of Home: House Home Access: Ramped entrance     Home Layout: One level Home Equipment: Walker - 2 wheels;Cane - single point;Shower seat      Prior Function Level of Independence: Independent with assistive device(s)         Comments: Uses RW at home; does not cook or drive. Daughter normally stays there with her during the day but leaves once she gets her in bed and then arrives before pt gets out of bed     Hand Dominance   Dominant Hand: Right    Extremity/Trunk Assessment   Upper Extremity Assessment: Generalized weakness           Lower Extremity Assessment: Generalized weakness      Cervical / Trunk Assessment: Kyphotic  Communication   Communication: No difficulties  Cognition Arousal/Alertness: Awake/alert Behavior During Therapy: WFL for tasks assessed/performed Overall Cognitive Status: History of cognitive impairments - at baseline (unable to recall birthdate)                      General Comments  Exercises        Assessment/Plan    PT Assessment Patient needs continued PT services  PT Diagnosis Difficulty walking;Generalized weakness   PT Problem List Decreased strength;Decreased activity tolerance;Decreased balance;Decreased mobility;Decreased knowledge of use of DME;Decreased safety awareness  PT Treatment Interventions DME instruction;Gait training;Functional mobility training;Therapeutic activities;Therapeutic exercise;Balance training;Patient/family education   PT Goals (Current goals can be found in the Care Plan section) Acute Rehab  PT Goals Patient Stated Goal: "stand up and sit down like I used to do" PT Goal Formulation: With patient Time For Goal Achievement: 03/20/16 Potential to Achieve Goals: Good    Frequency Min 3X/week   Barriers to discharge        Co-evaluation               End of Session Equipment Utilized During Treatment: Gait belt Activity Tolerance: Patient tolerated treatment well;Patient limited by fatigue Patient left: in chair;with family/visitor present;with call bell/phone within reach           Time: 0950-1008 PT Time Calculation (min) (ACUTE ONLY): 18 min   Charges:   PT Evaluation $PT Eval Moderate Complexity: 1 Procedure     PT G CodesHaze Justin 2016-03-30, 10:19 AM   Haze Justin, SPT [phone]

## 2016-03-15 LAB — CULTURE, BLOOD (ROUTINE X 2)
CULTURE: NO GROWTH
CULTURE: NO GROWTH

## 2016-03-17 ENCOUNTER — Telehealth: Payer: Self-pay | Admitting: Internal Medicine

## 2016-03-17 MED ORDER — TIOTROPIUM BROMIDE MONOHYDRATE 18 MCG IN CAPS: 18.0000 ug | ORAL_CAPSULE | Freq: Every day | RESPIRATORY_TRACT | Status: AC

## 2016-03-17 NOTE — Telephone Encounter (Signed)
Spoke with pt's daughter.  PT was d/c ed from hospital on 03/13/16 for n & v.  They noticed in the hospital that pt has increase trouble breathing with walking.  Also PT coming to house now and pt having increased sob with exercise.  Daughter also noticed that rx for Anna Genre was several years old.  New rx sent in for Spiriva and advised to keep appt with Dr Annamaria Boots on 03/20/16.  She verbalized understanding.  Nothing further needed.

## 2016-03-20 ENCOUNTER — Encounter: Payer: Self-pay | Admitting: Internal Medicine

## 2016-03-20 ENCOUNTER — Ambulatory Visit (INDEPENDENT_AMBULATORY_CARE_PROVIDER_SITE_OTHER): Payer: Medicare Other | Admitting: Internal Medicine

## 2016-03-20 VITALS — BP 138/68 | HR 51 | Ht 62.0 in | Wt 139.6 lb

## 2016-03-20 DIAGNOSIS — J449 Chronic obstructive pulmonary disease, unspecified: Secondary | ICD-10-CM

## 2016-03-20 DIAGNOSIS — J45909 Unspecified asthma, uncomplicated: Secondary | ICD-10-CM

## 2016-03-20 MED ORDER — UMECLIDINIUM-VILANTEROL 62.5-25 MCG/INH IN AEPB: 1.0000 | INHALATION_SPRAY | Freq: Every day | RESPIRATORY_TRACT | Status: DC

## 2016-03-20 NOTE — Progress Notes (Signed)
Patient ID: Michelle [name], female   DOB: 08-19-1932, 82 y.o.   MRN: [id number] Patient seen in the office today and instructed on use of Anoro.  Patient expressed understanding and demonstrated technique. Joellen Jersey [facility] 03/20/2016

## 2016-03-20 NOTE — Patient Instructions (Signed)
Sample- Anoro Ellipta      Inhale 1 puff, once daily     Try this instead of Spiriva to see if it helps your breathing. When the sample runs out, either call for a refill, or go back to your Spiriva.  Please call as needed

## 2016-03-20 NOTE — Progress Notes (Signed)
Patient ID: Michelle [name], female    DOB: 1932-06-11, 82 y.o.   MRN: [id number]  HPI 02/20/11-78 yoF never smoker, followed for COPD/ asthma, complicated by Polymyalgia Rheumatica. Last here February 21, 2010. Husband was a long time smoker- so she had significant second hand exposure and has continued also to heat with wood . She ran out of Advair using once daily, when she reached donut hole status last November. Didn't use it at all November and December. Now back at once daily. Notices some modest benefit, but still can't afford the Advair and asks alternatives. Has not had a rescue inhaler. Wheeze most days, sometimes worse than others. Cough scant sputum most mornings.  Blows nose. Denies chest pain. Has had occasional palpitations. Sees Dr Elisabeth Cara for cardiology. Tolerated right knee replacement last year.   02/21/14- 81 yoF never smoker, followed for COPD/ asthma, complicated by Polymyalgia Rheumatica PCP Dr Juanda Crumble Phillips/ [place] FOLLOWS FOR: Had episode of hard to breathe but has gotten better-back to were she used to be. Distracted by her back problems. Episode of dyspnea was helped with Spiriva. She is considering surgery for joints and pacemaker.  02/22/15- 81 yoF never smoker, followed for COPD/ asthma, complicated by Polymyalgia Rheumatica  FOLLOWS FOR: Pt states she has SOB with any activity. Denies any wheezing and chest tightness/congestion, and cough. In. She needs help renewing application for assistance from the manufacturer for this drug. Admits some dyspnea on exertion with hills and stairs but little cough or wheeze. CXR 02/21/14 IMPRESSION: No active cardiopulmonary disease. electronically Signed  By: Lajean Manes M.D.  On: 02/21/2014 15:59  03/20/2016-82 year old female never smoker followed for COPD/asthma, complicated by polymyalgia rheumatica, CAD, CKD3, DM 2 FOLLOWS FOR: Pt states she has trouble with SOB with exertion.   CXR 03/10/2016-NAD   Review of  Systems-Per HPI Constitutional:   No-   weight loss, night sweats, fevers, chills, fatigue, lassitude. HEENT:   No-  headaches, difficulty swallowing, tooth/dental problems, sore throat,       No-  sneezing, itching, ear ache, nasal congestion, post nasal drip,  CV:  No- chest pain, no- orthopnea, PND, swelling in lower extremities, anasarca,  dizziness, palpitations Resp: +  shortness of breath with exertion or at rest.             No- productive cough,  No non-productive cough,  No-  coughing up of blood.              No-   change in color of mucus.  No- wheezing.   Skin: No-   rash or lesions. GI:  No-   heartburn, indigestion, abdominal pain, nausea, vomiting,  GU:  MS:  No-   joint pain or swelling.  Neuro- nothing unusual Psych:  No- change in mood or affect. No depression or anxiety.  No memory loss.  Objective:   Physical Exam General- Alert, Oriented, Affect-appropriate, Distress- none acute. Talkative without apparent   breathlessness Skin- rash-none, lesions- none, excoriation- none Lymphadenopathy- none Head- atraumatic            Eyes- Gross vision intact, PERRLA, conjunctivae clear secretions            Ears- Hearing, canals-normal            Nose- Clear, no-Septal dev, mucus, polyps, erosion, perforation             Throat- Mallampati II , mucosa clear , drainage- none, tonsils- atrophic Neck- flexible , trachea midline, no  stridor , thyroid nl, carotid no bruit Chest - symmetrical excursion , unlabored           Heart/CV- RRR , faint systolic Ao murmur , no gallop  , no rub, nl s1 s2                           - JVD- none , edema- none, stasis changes- none, varices- none           Lung- clear to P&A, but distant.,  wheeze- none, cough- none , dullness-none, rub- none           Chest wall-  Abd-  Br/ Gen/ Rectal- Not done, not indicated Extrem- cyanosis- none, clubbing, none, atrophy- none, strength- nl, +cane Neuro- grossly intact to observation

## 2016-03-21 ENCOUNTER — Other Ambulatory Visit: Payer: Self-pay | Admitting: Family Medicine

## 2016-03-21 MED ORDER — TELMISARTAN 40 MG PO TABS: 40.0000 mg | ORAL_TABLET | Freq: Every day | ORAL | Status: DC

## 2016-03-25 DIAGNOSIS — Z79891 Long term (current) use of opiate analgesic: Secondary | ICD-10-CM | POA: Diagnosis not present

## 2016-03-25 DIAGNOSIS — G894 Chronic pain syndrome: Secondary | ICD-10-CM | POA: Diagnosis not present

## 2016-03-25 DIAGNOSIS — M545 Low back pain: Secondary | ICD-10-CM | POA: Diagnosis not present

## 2016-03-25 DIAGNOSIS — M47817 Spondylosis without myelopathy or radiculopathy, lumbosacral region: Secondary | ICD-10-CM | POA: Diagnosis not present

## 2016-03-25 DIAGNOSIS — M5137 Other intervertebral disc degeneration, lumbosacral region: Secondary | ICD-10-CM | POA: Diagnosis not present

## 2016-03-25 DIAGNOSIS — Z79899 Other long term (current) drug therapy: Secondary | ICD-10-CM | POA: Diagnosis not present

## 2016-04-01 ENCOUNTER — Other Ambulatory Visit: Payer: Self-pay | Admitting: Family Medicine

## 2016-04-01 MED ORDER — TELMISARTAN 40 MG PO TABS: 40.0000 mg | ORAL_TABLET | Freq: Every day | ORAL | Status: DC

## 2016-04-09 DIAGNOSIS — M47817 Spondylosis without myelopathy or radiculopathy, lumbosacral region: Secondary | ICD-10-CM | POA: Diagnosis not present

## 2016-04-23 ENCOUNTER — Encounter (HOSPITAL_COMMUNITY): Payer: Self-pay | Admitting: Family Medicine

## 2016-04-23 ENCOUNTER — Inpatient Hospital Stay (HOSPITAL_COMMUNITY): Payer: Medicare Other

## 2016-04-23 ENCOUNTER — Inpatient Hospital Stay (HOSPITAL_COMMUNITY)
Admission: EM | Admit: 2016-04-23 | Discharge: 2016-04-26 | DRG: 690 | Disposition: A | Discharge status: Home Health | Payer: Medicare Other | Attending: Internal Medicine | Admitting: Internal Medicine

## 2016-04-23 DIAGNOSIS — Z79891 Long term (current) use of opiate analgesic: Secondary | ICD-10-CM | POA: Diagnosis not present

## 2016-04-23 DIAGNOSIS — E1122 Type 2 diabetes mellitus with diabetic chronic kidney disease: Secondary | ICD-10-CM | POA: Diagnosis present

## 2016-04-23 DIAGNOSIS — M353 Polymyalgia rheumatica: Secondary | ICD-10-CM | POA: Diagnosis present

## 2016-04-23 DIAGNOSIS — R310 Gross hematuria: Secondary | ICD-10-CM | POA: Diagnosis present

## 2016-04-23 DIAGNOSIS — N183 Chronic kidney disease, stage 3 (moderate): Secondary | ICD-10-CM | POA: Diagnosis present

## 2016-04-23 DIAGNOSIS — Z79899 Other long term (current) drug therapy: Secondary | ICD-10-CM | POA: Diagnosis not present

## 2016-04-23 DIAGNOSIS — R001 Bradycardia, unspecified: Secondary | ICD-10-CM | POA: Diagnosis not present

## 2016-04-23 DIAGNOSIS — Z7982 Long term (current) use of aspirin: Secondary | ICD-10-CM

## 2016-04-23 DIAGNOSIS — N10 Acute tubulo-interstitial nephritis: Secondary | ICD-10-CM | POA: Diagnosis present

## 2016-04-23 DIAGNOSIS — L8990 Pressure ulcer of unspecified site, unspecified stage: Secondary | ICD-10-CM | POA: Diagnosis present

## 2016-04-23 DIAGNOSIS — N390 Urinary tract infection, site not specified: Secondary | ICD-10-CM | POA: Diagnosis not present

## 2016-04-23 DIAGNOSIS — Z7952 Long term (current) use of systemic steroids: Secondary | ICD-10-CM | POA: Diagnosis not present

## 2016-04-23 DIAGNOSIS — M5137 Other intervertebral disc degeneration, lumbosacral region: Secondary | ICD-10-CM | POA: Diagnosis not present

## 2016-04-23 DIAGNOSIS — J449 Chronic obstructive pulmonary disease, unspecified: Secondary | ICD-10-CM | POA: Diagnosis present

## 2016-04-23 DIAGNOSIS — F0280 Dementia in other diseases classified elsewhere without behavioral disturbance: Secondary | ICD-10-CM | POA: Diagnosis present

## 2016-04-23 DIAGNOSIS — I2510 Atherosclerotic heart disease of native coronary artery without angina pectoris: Secondary | ICD-10-CM | POA: Diagnosis present

## 2016-04-23 DIAGNOSIS — L89312 Pressure ulcer of right buttock, stage 2: Secondary | ICD-10-CM | POA: Diagnosis present

## 2016-04-23 DIAGNOSIS — M79606 Pain in leg, unspecified: Secondary | ICD-10-CM | POA: Diagnosis not present

## 2016-04-23 DIAGNOSIS — E860 Dehydration: Secondary | ICD-10-CM | POA: Diagnosis present

## 2016-04-23 DIAGNOSIS — M47817 Spondylosis without myelopathy or radiculopathy, lumbosacral region: Secondary | ICD-10-CM | POA: Diagnosis not present

## 2016-04-23 DIAGNOSIS — E0865 Diabetes mellitus due to underlying condition with hyperglycemia: Secondary | ICD-10-CM | POA: Diagnosis not present

## 2016-04-23 DIAGNOSIS — Z794 Long term (current) use of insulin: Secondary | ICD-10-CM

## 2016-04-23 DIAGNOSIS — E118 Type 2 diabetes mellitus with unspecified complications: Secondary | ICD-10-CM | POA: Diagnosis not present

## 2016-04-23 DIAGNOSIS — E875 Hyperkalemia: Secondary | ICD-10-CM | POA: Diagnosis present

## 2016-04-23 DIAGNOSIS — E039 Hypothyroidism, unspecified: Secondary | ICD-10-CM | POA: Diagnosis present

## 2016-04-23 DIAGNOSIS — G894 Chronic pain syndrome: Secondary | ICD-10-CM | POA: Diagnosis not present

## 2016-04-23 DIAGNOSIS — G309 Alzheimer's disease, unspecified: Secondary | ICD-10-CM | POA: Diagnosis present

## 2016-04-23 DIAGNOSIS — E785 Hyperlipidemia, unspecified: Secondary | ICD-10-CM | POA: Diagnosis present

## 2016-04-23 DIAGNOSIS — Z66 Do not resuscitate: Secondary | ICD-10-CM | POA: Diagnosis present

## 2016-04-23 DIAGNOSIS — I129 Hypertensive chronic kidney disease with stage 1 through stage 4 chronic kidney disease, or unspecified chronic kidney disease: Secondary | ICD-10-CM | POA: Diagnosis present

## 2016-04-23 DIAGNOSIS — M6281 Muscle weakness (generalized): Secondary | ICD-10-CM | POA: Diagnosis not present

## 2016-04-23 DIAGNOSIS — B9620 Unspecified Escherichia coli [E. coli] as the cause of diseases classified elsewhere: Secondary | ICD-10-CM | POA: Diagnosis present

## 2016-04-23 DIAGNOSIS — N179 Acute kidney failure, unspecified: Secondary | ICD-10-CM | POA: Diagnosis not present

## 2016-04-23 DIAGNOSIS — E1121 Type 2 diabetes mellitus with diabetic nephropathy: Secondary | ICD-10-CM | POA: Diagnosis present

## 2016-04-23 DIAGNOSIS — A499 Bacterial infection, unspecified: Secondary | ICD-10-CM | POA: Diagnosis not present

## 2016-04-23 LAB — COMPREHENSIVE METABOLIC PANEL
ALT: 12 U/L — AB (ref 14–54)
ANION GAP: 15 (ref 5–15)
AST: 18 U/L (ref 15–41)
Albumin: 4.2 g/dL (ref 3.5–5.0)
Alkaline Phosphatase: 52 U/L (ref 38–126)
BILIRUBIN TOTAL: 0.6 mg/dL (ref 0.3–1.2)
BUN: 93 mg/dL — ABNORMAL HIGH (ref 6–20)
CALCIUM: 9.7 mg/dL (ref 8.9–10.3)
CO2: 19 mmol/L — ABNORMAL LOW (ref 22–32)
Chloride: 99 mmol/L — ABNORMAL LOW (ref 101–111)
Creatinine, Ser: 2.32 mg/dL — ABNORMAL HIGH (ref 0.44–1.00)
GFR, EST AFRICAN AMERICAN: 22 mL/min — AB (ref >60)
GFR, EST NON AFRICAN AMERICAN: 19 mL/min — AB (ref >60)
Glucose, Bld: 187 mg/dL — ABNORMAL HIGH (ref 65–99)
POTASSIUM: 5.2 mmol/L — AB (ref 3.5–5.1)
Sodium: 133 mmol/L — ABNORMAL LOW (ref 135–145)
TOTAL PROTEIN: 6.8 g/dL (ref 6.5–8.1)

## 2016-04-23 LAB — CBC WITH DIFFERENTIAL/PLATELET
BASOS ABS: 0 10*3/uL (ref 0.0–0.1)
Basophils Relative: 0 %
EOS PCT: 0 %
Eosinophils Absolute: 0 10*3/uL (ref 0.0–0.7)
HEMATOCRIT: 34.3 % — AB (ref 36.0–46.0)
Hemoglobin: 11.4 g/dL — ABNORMAL LOW (ref 12.0–15.0)
LYMPHS ABS: 0.8 10*3/uL (ref 0.7–4.0)
LYMPHS PCT: 9 %
MCH: 30.6 pg (ref 26.0–34.0)
MCHC: 33.2 g/dL (ref 30.0–36.0)
MCV: 92 fL (ref 78.0–100.0)
MONO ABS: 0.4 10*3/uL (ref 0.1–1.0)
Monocytes Relative: 5 %
NEUTROS ABS: 7.6 10*3/uL (ref 1.7–7.7)
Neutrophils Relative %: 86 %
PLATELETS: 225 10*3/uL (ref 150–400)
RBC: 3.73 MIL/uL — AB (ref 3.87–5.11)
RDW: 13.8 % (ref 11.5–15.5)
WBC: 8.8 10*3/uL (ref 4.0–10.5)

## 2016-04-23 LAB — URINE MICROSCOPIC-ADD ON

## 2016-04-23 LAB — URINALYSIS, ROUTINE W REFLEX MICROSCOPIC
GLUCOSE, UA: 250 mg/dL — AB
KETONES UR: NEGATIVE mg/dL
Nitrite: POSITIVE — AB
PH: 6.5 (ref 5.0–8.0)
Protein, ur: >300 mg/dL — AB
SPECIFIC GRAVITY, URINE: 1.015 (ref 1.005–1.030)

## 2016-04-23 LAB — I-STAT TROPONIN, ED: TROPONIN I, POC: 0.05 ng/mL (ref 0.00–0.08)

## 2016-04-23 MED ORDER — SODIUM CHLORIDE 0.9 % IV BOLUS (SEPSIS): 500.0000 mL | Freq: Once | INTRAVENOUS | Status: DC

## 2016-04-23 MED ORDER — LEVOTHYROXINE SODIUM 100 MCG PO TABS
100.0000 ug | ORAL_TABLET | Freq: Every day | ORAL | Status: DC
  Administered 2016-04-24 – 2016-04-26 (×3): 100 ug via ORAL
  Filled 2016-04-23 (×3): qty 1

## 2016-04-23 MED ORDER — INSULIN NPH (HUMAN) (ISOPHANE) 100 UNIT/ML ~~LOC~~ SUSP
9.0000 [IU] | Freq: Every day | SUBCUTANEOUS | Status: DC
  Filled 2016-04-23: qty 10

## 2016-04-23 MED ORDER — HYDROCODONE-ACETAMINOPHEN 5-325 MG PO TABS
1.0000 | ORAL_TABLET | ORAL | Status: DC | PRN
  Administered 2016-04-25: 1 via ORAL
  Filled 2016-04-23: qty 1

## 2016-04-23 MED ORDER — INSULIN ASPART 100 UNIT/ML ~~LOC~~ SOLN
0.0000 [IU] | SUBCUTANEOUS | Status: DC
  Administered 2016-04-24: 3 [IU] via SUBCUTANEOUS
  Administered 2016-04-24: 7 [IU] via SUBCUTANEOUS
  Administered 2016-04-24: 2 [IU] via SUBCUTANEOUS
  Administered 2016-04-24: 3 [IU] via SUBCUTANEOUS
  Administered 2016-04-25: 7 [IU] via SUBCUTANEOUS
  Administered 2016-04-25: 2 [IU] via SUBCUTANEOUS

## 2016-04-23 MED ORDER — ASPIRIN 81 MG PO CHEW
81.0000 mg | CHEWABLE_TABLET | Freq: Every day | ORAL | Status: DC
  Administered 2016-04-24 – 2016-04-26 (×3): 81 mg via ORAL
  Filled 2016-04-23 (×3): qty 1

## 2016-04-23 MED ORDER — FENTANYL 12 MCG/HR TD PT72: 50.0000 ug | MEDICATED_PATCH | TRANSDERMAL | Status: DC

## 2016-04-23 MED ORDER — SODIUM CHLORIDE 0.9 % IV BOLUS (SEPSIS)
500.0000 mL | Freq: Once | INTRAVENOUS | Status: AC
  Administered 2016-04-23: 500 mL via INTRAVENOUS

## 2016-04-23 MED ORDER — FERROUS SULFATE 325 (65 FE) MG PO TABS
325.0000 mg | ORAL_TABLET | Freq: Every day | ORAL | Status: DC
  Administered 2016-04-24 – 2016-04-26 (×3): 325 mg via ORAL
  Filled 2016-04-23 (×3): qty 1

## 2016-04-23 MED ORDER — QUETIAPINE FUMARATE 50 MG PO TABS
50.0000 mg | ORAL_TABLET | Freq: Every day | ORAL | Status: DC
  Administered 2016-04-24 – 2016-04-25 (×3): 50 mg via ORAL
  Filled 2016-04-23 (×3): qty 1

## 2016-04-23 MED ORDER — TIOTROPIUM BROMIDE MONOHYDRATE 18 MCG IN CAPS
18.0000 ug | ORAL_CAPSULE | Freq: Every day | RESPIRATORY_TRACT | Status: DC
  Administered 2016-04-24 – 2016-04-26 (×3): 18 ug via RESPIRATORY_TRACT
  Filled 2016-04-23: qty 5

## 2016-04-23 MED ORDER — DEXTROSE 5 % IV SOLN
1.0000 g | INTRAVENOUS | Status: DC
  Administered 2016-04-24 – 2016-04-25 (×2): 1 g via INTRAVENOUS
  Filled 2016-04-23 (×3): qty 10

## 2016-04-23 MED ORDER — MEMANTINE HCL 10 MG PO TABS
10.0000 mg | ORAL_TABLET | Freq: Two times a day (BID) | ORAL | Status: DC
  Administered 2016-04-24 – 2016-04-25 (×5): 10 mg via ORAL
  Filled 2016-04-23 (×6): qty 1

## 2016-04-23 MED ORDER — SODIUM CHLORIDE 0.9 % IV SOLN
INTRAVENOUS | Status: DC
  Administered 2016-04-23 – 2016-04-24 (×2): via INTRAVENOUS
  Administered 2016-04-25: 100 mL via INTRAVENOUS

## 2016-04-23 MED ORDER — AMLODIPINE BESYLATE 10 MG PO TABS
10.0000 mg | ORAL_TABLET | Freq: Every day | ORAL | Status: DC
  Administered 2016-04-24 – 2016-04-26 (×3): 10 mg via ORAL
  Filled 2016-04-23 (×3): qty 1

## 2016-04-23 MED ORDER — PREDNISONE 5 MG PO TABS
5.0000 mg | ORAL_TABLET | Freq: Every day | ORAL | Status: DC
  Administered 2016-04-24 – 2016-04-26 (×3): 5 mg via ORAL
  Filled 2016-04-23 (×3): qty 1

## 2016-04-23 MED ORDER — DEXTROSE 5 % IV SOLN
1.0000 g | Freq: Once | INTRAVENOUS | Status: AC
  Administered 2016-04-23: 1 g via INTRAVENOUS
  Filled 2016-04-23: qty 10

## 2016-04-23 NOTE — ED Notes (Signed)
Pt here with blood in urine since yesterday. Denies pain

## 2016-04-23 NOTE — ED Provider Notes (Signed)
CSN: [id number]     Arrival date & time 04/23/16  1411 History   First MD Initiated Contact with Patient 04/23/16 1717     Chief Complaint  Patient presents with  . Hematuria     (Consider location/radiation/quality/duration/timing/severity/associated sxs/prior Treatment) HPI Patient presents with her daughter. Has a history of dementia. Level V caveat applies. Daughter states patient has had increased fatigue since leaving the hospital 6 weeks ago. She noticed that there was blood in the patient's urine 3 days ago. She's noticed increasing frequency and patient complaining of pain when she urinates. Patient was complaining of back pain earlier today but now denies it. She's had no fever or chills. No nausea or vomiting. Past Medical History  Diagnosis Date  . Type II or unspecified type diabetes mellitus without mention of complication, not stated as uncontrolled   . Dyslipidemia   . Asthma   . Arthritis   . Gallstones   . COPD (chronic obstructive pulmonary disease) ([place])   . Interstitial cystitis   . UTI (lower urinary tract infection)   . Malaise and fatigue   . Memory loss   . Coronary atherosclerosis   . Systemic hypertension   . Polymyalgia rheumatica ([place])   . Hypothyroidism   . History of fractured kneecap    Past Surgical History  Procedure Laterality Date  . Lumbar disc surgery    . Total abdominal hysterectomy    . Rotator cuff repair    . Tonsillectomy    . Cervical spine surgery    . Cholecystectomy    . Hernia repair    . Cardiac catheterization  01/29/2006    10% luminal irregularity mid LAD  . Cardiac catheterization  03/15/2001    No significant CAD, no evidence of renal artery stenosis, systemic hypertenion  . US echocardiography  09/14/2007    mild LVH, LA mildly dilated,mild mitral annular calcification, AOV mildly sclerotic, aortic root sclerosis/ca+  . Nm myocar perf wall motion  08/17/2009    normal   Family History  Problem Relation Age of Onset   . Breast cancer Mother   . Heart disease Father   . Lung cancer Brother    Social History  Substance Use Topics  . Smoking status: Never Smoker   . Smokeless tobacco: Never Used  . Alcohol Use: No   OB History    No data available     Review of Systems  Constitutional: Positive for appetite change and fatigue. Negative for fever and chills.  Respiratory: Negative for cough and shortness of breath.   Cardiovascular: Negative for chest pain.  Gastrointestinal: Negative for nausea, vomiting, abdominal pain and diarrhea.  Genitourinary: Positive for dysuria, frequency and hematuria. Negative for flank pain.  Musculoskeletal: Positive for back pain. Negative for neck pain.  Skin: Negative for rash and wound.  Neurological: Positive for weakness (generalized). Negative for headaches.  Psychiatric/Behavioral: Positive for confusion.  All other systems reviewed and are negative.     Allergies  Review of patient's allergies indicates no known allergies.  Home Medications   Prior to Admission medications   Medication Sig Start Date End Date Taking? Authorizing Provider  amLODipine (NORVASC) 10 MG tablet Take 1 tablet (10 mg total) by mouth daily. 12/18/15  Yes Susy Frizzle, MD  aspirin 81 MG tablet Take 81 mg by mouth daily.   Yes Historical Provider, MD  Calcium Carbonate-Vit D-Min (CALCIUM 1200 PO) Take 1 tablet by mouth daily.    Yes Historical Provider, MD  Cholecalciferol (VITAMIN D3) 2000 units TABS Take by mouth.   Yes Historical Provider, MD  fentaNYL (DURAGESIC - DOSED MCG/HR) 50 MCG/HR Place 50 mcg onto the skin every 3 (three) days.   Yes Historical Provider, MD  ferrous sulfate 325 (65 FE) MG tablet Take 325 mg by mouth daily with breakfast.   Yes Historical Provider, MD  hydrochlorothiazide (HYDRODIURIL) 25 MG tablet Take 1 tablet by mouth daily. 03/31/16  Yes Historical Provider, MD  HYDROcodone-acetaminophen (NORCO/VICODIN) 5-325 MG per tablet Take 1 tablet by mouth  as needed.    Yes Historical Provider, MD  insulin lispro (HUMALOG) 100 UNIT/ML injection Inject 0-6 Units into the skin 3 (three) times daily as needed for high blood sugar. Patient uses sliding scale   Yes Historical Provider, MD  insulin NPH (HUMULIN N,NOVOLIN N) 100 UNIT/ML injection Inject 18 Units into the skin every morning.    Yes Historical Provider, MD  levothyroxine (SYNTHROID, LEVOTHROID) 100 MCG tablet TAKE 1 TABLET (100 MCG TOTAL) BY MOUTH DAILY. 01/28/16  Yes Susy Frizzle, MD  memantine (NAMENDA) 10 MG tablet TAKE ONE TABLET BY MOUTH TWICE DAILY 02/14/16  Yes Marcial Pacas, MD  Multiple Vitamins-Minerals (OCUVITE PRESERVISION PO) Take 1 capsule by mouth daily.   Yes Historical Provider, MD  ONE TOUCH ULTRA TEST test strip TEST BS 5-6 TIMES A DAY 12/27/15  Yes Susy Frizzle, MD  predniSONE (DELTASONE) 5 MG tablet Take 1 tablet by mouth daily. 04/15/16  Yes Historical Provider, MD  QUEtiapine (SEROQUEL) 25 MG tablet Take 2 tablets (50 mg total) by mouth at bedtime. 12/12/15  Yes Marcial Pacas, MD  telmisartan (MICARDIS) 40 MG tablet Take 1 tablet (40 mg total) by mouth daily. 04/01/16  Yes Susy Frizzle, MD  tiotropium (SPIRIVA HANDIHALER) 18 MCG inhalation capsule Place 1 capsule (18 mcg total) into inhaler and inhale daily. 03/17/16 02/26/18 Yes Clinton D Young, MD   BP 115/51 mmHg  Pulse 68  Temp(Src) 97.4 F (36.3 C)  Resp 13  SpO2 98% Physical Exam  Constitutional: She appears well-developed and well-nourished. No distress.  HENT:  Head: Normocephalic and atraumatic.  Mouth/Throat: Oropharynx is clear and moist. No oropharyngeal exudate.  Eyes: EOM are normal. Pupils are equal, round, and reactive to light.  Neck: Normal range of motion. Neck supple. No JVD present.  Cardiovascular: Regular rhythm.  Exam reveals no gallop and no friction rub.   No murmur heard. Bradycardia  Pulmonary/Chest: Effort normal and breath sounds normal. No respiratory distress. She has no wheezes. She  has no rales. She exhibits no tenderness.  Abdominal: Soft. Bowel sounds are normal. She exhibits no distension and no mass. There is no tenderness. There is no rebound and no guarding.  Musculoskeletal: Normal range of motion. She exhibits no edema or tenderness.  No CVA tenderness bilaterally. No lower sugary swelling, asymmetry or tenderness. Distal pulses are equal and intact.  Neurological: She is alert.  Patient is oriented to self. 5/5 motor in all extremities. Sensation is fully intact.  Skin: Skin is warm and dry. No rash noted. No erythema.  Psychiatric: She has a normal mood and affect. Her behavior is normal.  Nursing note and vitals reviewed.   ED Course  Procedures (including critical care time) Labs Review Labs Reviewed  URINALYSIS, ROUTINE W REFLEX MICROSCOPIC (NOT AT [facility]) - Abnormal; Notable for the following:    Color, Urine RED (*)    APPearance CLOUDY (*)    Glucose, UA 250 (*)    Hgb  urine dipstick LARGE (*)    Bilirubin Urine LARGE (*)    Protein, ur >300 (*)    Nitrite POSITIVE (*)    Leukocytes, UA LARGE (*)    All other components within normal limits  URINE MICROSCOPIC-ADD ON - Abnormal; Notable for the following:    Squamous Epithelial / LPF 6-30 (*)    Bacteria, UA MANY (*)    All other components within normal limits  CBC WITH DIFFERENTIAL/PLATELET - Abnormal; Notable for the following:    RBC 3.73 (*)    Hemoglobin 11.4 (*)    HCT 34.3 (*)    All other components within normal limits  COMPREHENSIVE METABOLIC PANEL - Abnormal; Notable for the following:    Sodium 133 (*)    Potassium 5.2 (*)    Chloride 99 (*)    CO2 19 (*)    Glucose, Bld 187 (*)    BUN 93 (*)    Creatinine, Ser 2.32 (*)    ALT 12 (*)    GFR calc non Af Amer 19 (*)    GFR calc Af Amer 22 (*)    All other components within normal limits  URINE CULTURE  TSH  BASIC METABOLIC PANEL  I-STAT TROPOININ, ED    Imaging Review No results found. I have personally reviewed and  evaluated these images and lab results as part of my medical decision-making.   EKG Interpretation   Date/Time:  Wednesday April 23 2016 17:47:42 EDT Ventricular Rate:  64 PR Interval:  176 QRS Duration: 121 QT Interval:  442 QTC Calculation: 456 R Axis:   -2 Text Interpretation:  Sinus rhythm Nonspecific IVCD with LAD Left  ventricular hypertrophy Anterior infarct, old Confirmed by Lita Mains  MD,  Zuleyka Kloc ([id number]) on 04/23/2016 6:06:44 PM      MDM   Final diagnoses:  UTI (lower urinary tract infection)  Acute kidney injury [facility])    Daughter states patient has-been refusing to eat or drink recently. Labs correspond with acute kidney injury and mild hyperkalemia. Question whether this is due to dehydration. Initiate IV fluids in the emergency department as well as antibiotics for UTI. Patient's vital signs remained stable.    Julianne Rice, MD 04/23/16 2125

## 2016-04-23 NOTE — ED Notes (Signed)
Tried calling report to [facility]. Nurse will call me back

## 2016-04-23 NOTE — H&P (Signed)
History and Physical    Michelle [name] [id number] DOB: 09-14-1932 DOA: 04/23/2016   PCP: [name], MD Chief Complaint:  Chief Complaint  Patient presents with  . Hematuria    HPI: Michelle [name] is a 82 y.o. female with medical history significant of alzheimer's dementia, DM2, CKD stage 3, HTN.  Patient presents to the ED with daughter and c/o hematuria and dysuria.  Hematuria onset on Sunday but patient wasn't having any complaints of pain until today per daughter.  Today she complained of dysuria and mild amount of left sided back pain.  No fevers nor chills, has been having increased fatigue since hospital discharge 6 weeks ago.  No N/V.  ED Course: Found to have AKI on labs, gross hematuria with pyuria.  Hospitalist asked for admission  Review of Systems: As per HPI otherwise 10 point review of systems negative.    Past Medical History  Diagnosis Date  . Type II or unspecified type diabetes mellitus without mention of complication, not stated as uncontrolled   . Dyslipidemia   . Asthma   . Arthritis   . Gallstones   . COPD (chronic obstructive pulmonary disease) ([place])   . Interstitial cystitis   . UTI (lower urinary tract infection)   . Malaise and fatigue   . Memory loss   . Coronary atherosclerosis   . Systemic hypertension   . Polymyalgia rheumatica ([place])   . Hypothyroidism   . History of fractured kneecap     Past Surgical History  Procedure Laterality Date  . Lumbar disc surgery    . Total abdominal hysterectomy    . Rotator cuff repair    . Tonsillectomy    . Cervical spine surgery    . Cholecystectomy    . Hernia repair    . Cardiac catheterization  01/29/2006    10% luminal irregularity mid LAD  . Cardiac catheterization  03/15/2001    No significant CAD, no evidence of renal artery stenosis, systemic hypertenion  . US echocardiography  09/14/2007    mild LVH, LA mildly dilated,mild mitral annular calcification, AOV mildly sclerotic, aortic  root sclerosis/ca+  . Nm myocar perf wall motion  08/17/2009    normal     reports that she has never smoked. She has never used smokeless tobacco. She reports that she does not drink alcohol or use illicit drugs.  No Known Allergies  Family History  Problem Relation Age of Onset  . Breast cancer Mother   . Heart disease Father   . Lung cancer Brother      Prior to Admission medications   Medication Sig Start Date End Date Taking? Authorizing Provider  amLODipine (NORVASC) 10 MG tablet Take 1 tablet (10 mg total) by mouth daily. 12/18/15  Yes Susy Frizzle, MD  aspirin 81 MG tablet Take 81 mg by mouth daily.   Yes Historical Provider, MD  Calcium Carbonate-Vit D-Min (CALCIUM 1200 PO) Take 1 tablet by mouth daily.    Yes Historical Provider, MD  Cholecalciferol (VITAMIN D3) 2000 units TABS Take by mouth.   Yes Historical Provider, MD  fentaNYL (DURAGESIC - DOSED MCG/HR) 50 MCG/HR Place 50 mcg onto the skin every 3 (three) days.   Yes Historical Provider, MD  ferrous sulfate 325 (65 FE) MG tablet Take 325 mg by mouth daily with breakfast.   Yes Historical Provider, MD  hydrochlorothiazide (HYDRODIURIL) 25 MG tablet Take 1 tablet by mouth daily. 03/31/16  Yes Historical Provider, MD  HYDROcodone-acetaminophen (NORCO/VICODIN) 5-325  MG per tablet Take 1 tablet by mouth as needed.    Yes Historical Provider, MD  insulin lispro (HUMALOG) 100 UNIT/ML injection Inject 0-6 Units into the skin 3 (three) times daily as needed for high blood sugar. Patient uses sliding scale   Yes Historical Provider, MD  insulin NPH (HUMULIN N,NOVOLIN N) 100 UNIT/ML injection Inject 18 Units into the skin every morning.    Yes Historical Provider, MD  levothyroxine (SYNTHROID, LEVOTHROID) 100 MCG tablet TAKE 1 TABLET (100 MCG TOTAL) BY MOUTH DAILY. 01/28/16  Yes Susy Frizzle, MD  memantine (NAMENDA) 10 MG tablet TAKE ONE TABLET BY MOUTH TWICE DAILY 02/14/16  Yes Marcial Pacas, MD  Multiple Vitamins-Minerals (OCUVITE  PRESERVISION PO) Take 1 capsule by mouth daily.   Yes Historical Provider, MD  ONE TOUCH ULTRA TEST test strip TEST BS 5-6 TIMES A DAY 12/27/15  Yes Susy Frizzle, MD  predniSONE (DELTASONE) 5 MG tablet Take 1 tablet by mouth daily. 04/15/16  Yes Historical Provider, MD  QUEtiapine (SEROQUEL) 25 MG tablet Take 2 tablets (50 mg total) by mouth at bedtime. 12/12/15  Yes Marcial Pacas, MD  telmisartan (MICARDIS) 40 MG tablet Take 1 tablet (40 mg total) by mouth daily. 04/01/16  Yes Susy Frizzle, MD  tiotropium (SPIRIVA HANDIHALER) 18 MCG inhalation capsule Place 1 capsule (18 mcg total) into inhaler and inhale daily. 03/17/16 02/26/18 Yes Deneise Lever, MD    Physical Exam: Filed Vitals:   04/23/16 1715 04/23/16 1830 04/23/16 1900 04/23/16 2000  BP: 124/52 122/89 119/49 115/51  Pulse: 59 64 68 68  Temp:      Resp: 17 11 15 13   SpO2:  100% 96% 98%      Constitutional: NAD, calm, comfortable Eyes: PERRL, lids and conjunctivae normal ENMT: Mucous membranes are moist. Posterior pharynx clear of any exudate or lesions.Normal dentition.  Neck: normal, supple, no masses, no thyromegaly Respiratory: clear to auscultation bilaterally, no wheezing, no crackles. Normal respiratory effort. No accessory muscle use.  Cardiovascular: Regular rate and rhythm, no murmurs / rubs / gallops. No extremity edema. 2+ pedal pulses. No carotid bruits.  Abdomen: no tenderness, no masses palpated. No hepatosplenomegaly. Bowel sounds positive. Mild L CVA tenderness to percussion Musculoskeletal: no clubbing / cyanosis. No joint deformity upper and lower extremities. Good ROM, no contractures. Normal muscle tone.  Skin: no rashes, lesions, ulcers. No induration Neurologic: CN 2-12 grossly intact. Sensation intact, DTR normal. Strength 5/5 in all 4.  Psychiatric: Normal judgment and insight. Alert and oriented x 3. Normal mood.    Labs on Admission: I have personally reviewed following labs and imaging  studies  CBC:  Recent Labs Lab 04/23/16 1815  WBC 8.8  NEUTROABS 7.6  HGB 11.4*  HCT 34.3*  MCV 92.0  PLT 123456   Basic Metabolic Panel:  Recent Labs Lab 04/23/16 1815  NA 133*  K 5.2*  CL 99*  CO2 19*  GLUCOSE 187*  BUN 93*  CREATININE 2.32*  CALCIUM 9.7   GFR: CrCl cannot be calculated (Unknown ideal weight.). Liver Function Tests:  Recent Labs Lab 04/23/16 1815  AST 18  ALT 12*  ALKPHOS 52  BILITOT 0.6  PROT 6.8  ALBUMIN 4.2   No results for input(s): LIPASE, AMYLASE in the last 168 hours. No results for input(s): AMMONIA in the last 168 hours. Coagulation Profile: No results for input(s): INR, PROTIME in the last 168 hours. Cardiac Enzymes: No results for input(s): CKTOTAL, CKMB, CKMBINDEX, TROPONINI in the last 168  hours. BNP (last 3 results) No results for input(s): PROBNP in the last 8760 hours. HbA1C: No results for input(s): HGBA1C in the last 72 hours. CBG: No results for input(s): GLUCAP in the last 168 hours. Lipid Profile: No results for input(s): CHOL, HDL, LDLCALC, TRIG, CHOLHDL, LDLDIRECT in the last 72 hours. Thyroid Function Tests: No results for input(s): TSH, T4TOTAL, FREET4, T3FREE, THYROIDAB in the last 72 hours. Anemia Panel: No results for input(s): VITAMINB12, FOLATE, FERRITIN, TIBC, IRON, RETICCTPCT in the last 72 hours. Urine analysis:    Component Value Date/Time   COLORURINE RED* 04/23/2016 1423   APPEARANCEUR CLOUDY* 04/23/2016 1423   LABSPEC 1.015 04/23/2016 1423   PHURINE 6.5 04/23/2016 1423   GLUCOSEU 250* 04/23/2016 1423   HGBUR LARGE* 04/23/2016 1423   BILIRUBINUR LARGE* 04/23/2016 1423   KETONESUR NEGATIVE 04/23/2016 1423   PROTEINUR >300* 04/23/2016 1423   UROBILINOGEN 1.0 06/12/2015 1937   NITRITE POSITIVE* 04/23/2016 1423   LEUKOCYTESUR LARGE* 04/23/2016 1423   Sepsis Labs: @LABRCNTIP (procalcitonin:4,lacticidven:4) )No results found for this or any previous visit (from the past 240 hour(s)).    Radiological Exams on Admission: No results found.  EKG: Independently reviewed.  Assessment/Plan Principal Problem:   Hematuria, gross Active Problems:   Polymyalgia rheumatica (HCC)   Hypothyroidism   Acute renal failure superimposed on stage 3 chronic kidney disease (HCC)   Diabetes mellitus with complication (HCC)   Alzheimer's dementia   UTI (urinary tract infection), bacterial   AKI (acute kidney injury) ([place])   Hematuria -  Treating the UTI with rocephin, culture ordered and pending  Renal and bladder US  AKI on CKD stage 3 -  Holding HCTZ and ARB  Hydrating as this does have a pre-renal appearance with BUN >> creatinine  Recheck BMP in AM  Strict intake and output  Hypothyroidism - continue synthroid, check TSH in AM  Alzheimers - continue namenda  PMR - continue prednisone  DM -  Reducing NPH to 9 units QAM  Due to poor PO intake will put patient on Q4H sensitive scale SSI   DVT prophylaxis: SCDs only due to hematuria Code Status: DNR Family Communication: Daughter at bedside Consults called: none Admission status: Admit to inpatient   Etta Quill DO [facility] Pager [phone] from 7PM-7AM  If 7AM-7PM, please contact the day physician for the patient [email or web address] Password [email or web address]  04/23/2016, 9:14 PM

## 2016-04-23 NOTE — ED Notes (Signed)
MD at bedside. 

## 2016-04-23 NOTE — ED Notes (Signed)
Tried calling report again. Nurse reports she's doing pt care and cant take report. Nujrse wanted to know how long pt had been in ED.

## 2016-04-24 ENCOUNTER — Ambulatory Visit: Payer: Medicare Other | Admitting: Family Medicine

## 2016-04-24 DIAGNOSIS — A499 Bacterial infection, unspecified: Secondary | ICD-10-CM

## 2016-04-24 DIAGNOSIS — N390 Urinary tract infection, site not specified: Secondary | ICD-10-CM

## 2016-04-24 DIAGNOSIS — L8990 Pressure ulcer of unspecified site, unspecified stage: Secondary | ICD-10-CM | POA: Diagnosis present

## 2016-04-24 DIAGNOSIS — N179 Acute kidney failure, unspecified: Secondary | ICD-10-CM

## 2016-04-24 DIAGNOSIS — G309 Alzheimer's disease, unspecified: Secondary | ICD-10-CM

## 2016-04-24 DIAGNOSIS — R310 Gross hematuria: Secondary | ICD-10-CM

## 2016-04-24 DIAGNOSIS — F0280 Dementia in other diseases classified elsewhere without behavioral disturbance: Secondary | ICD-10-CM

## 2016-04-24 DIAGNOSIS — N183 Chronic kidney disease, stage 3 (moderate): Secondary | ICD-10-CM

## 2016-04-24 LAB — BASIC METABOLIC PANEL
ANION GAP: 9 (ref 5–15)
BUN: 77 mg/dL — AB (ref 6–20)
CO2: 23 mmol/L (ref 22–32)
Calcium: 8.5 mg/dL — ABNORMAL LOW (ref 8.9–10.3)
Chloride: 104 mmol/L (ref 101–111)
Creatinine, Ser: 1.81 mg/dL — ABNORMAL HIGH (ref 0.44–1.00)
GFR calc Af Amer: 29 mL/min — ABNORMAL LOW (ref >60)
GFR, EST NON AFRICAN AMERICAN: 25 mL/min — AB (ref >60)
Glucose, Bld: 56 mg/dL — ABNORMAL LOW (ref 65–99)
POTASSIUM: 4.2 mmol/L (ref 3.5–5.1)
SODIUM: 136 mmol/L (ref 135–145)

## 2016-04-24 LAB — GLUCOSE, CAPILLARY
GLUCOSE-CAPILLARY: 174 mg/dL — AB (ref 65–99)
GLUCOSE-CAPILLARY: 204 mg/dL — AB (ref 65–99)
GLUCOSE-CAPILLARY: 220 mg/dL — AB (ref 65–99)
GLUCOSE-CAPILLARY: 317 mg/dL — AB (ref 65–99)
GLUCOSE-CAPILLARY: 98 mg/dL (ref 65–99)
Glucose-Capillary: 88 mg/dL (ref 65–99)
Glucose-Capillary: 54 mg/dL — ABNORMAL LOW (ref 65–99)

## 2016-04-24 LAB — TSH: TSH: 0.608 u[IU]/mL (ref 0.350–4.500)

## 2016-04-24 MED ORDER — INSULIN NPH (HUMAN) (ISOPHANE) 100 UNIT/ML ~~LOC~~ SUSP
15.0000 [IU] | Freq: Every day | SUBCUTANEOUS | Status: DC
  Administered 2016-04-25 – 2016-04-26 (×2): 15 [IU] via SUBCUTANEOUS

## 2016-04-24 NOTE — Progress Notes (Signed)
Inpatient Diabetes Program Recommendations  AACE/ADA: New Consensus Statement on Inpatient Glycemic Control (2015)  Target Ranges:  Prepandial:   less than 140 mg/dL      Peak postprandial:   less than 180 mg/dL (1-2 hours)      Critically ill patients:  140 - 180 mg/dL   Lab Results  Component Value Date   GLUCAP 317* 04/24/2016   HGBA1C 8.4* 03/10/2016    Review of Glycemic Control  Inpatient Diabetes Program Recommendations:  Correction (SSI): change Novolog to TID instead of Q4 Thank you  Raoul Pitch MSN, RN,CDE Inpatient Diabetes Coordinator [phone] (team pager)

## 2016-04-24 NOTE — Progress Notes (Signed)
PROGRESS NOTE                                                                                                                                                                                                             Patient Demographics:    Michelle [name], is a 82 y.o. female, DOB - [birth year], [id number]  Admit date - 04/23/2016   Admitting Physician Etta Quill, DO  Outpatient Primary MD for the patient is [facility] [name], MD  LOS - 1  Outpatient Specialists: none  Chief Complaint  Patient presents with  . Hematuria       Brief Narrative   82 year old with history of thousand was dementia, type 2 diabetes mellitus, CK disease stage III (baseline creatinine of 1.4) and hypertension was brought to the ED by her daughter with dysuria and hematuria for the past 2 days. Patient also complained of left flank pain on the day of admission. Patient was discharged from the hospital about 6 weeks ago and since then was complaining of increased fatigue. Denied any nausea vomiting or fever. In the ED she was found to have acute on chronic kidney injury with gross hematuria and pyuria. Admitted to hospital service for further management.   Subjective:   Patient denies any dysuria or flank pain.   Assessment  & Plan :    Principal Problem: UTI with? Acute pyelonephritis and gross hematuria Continue empiric Rocephin. Follow cultures. Renal ultrasound shows medical renal disease without hydronephrosis and small amount of layering debris within the bladder lumen. Continue IV fluids. Supportive care with antiemetics.  Active Problems: Gross hematuria Suspect due to UTI. No prior history. Daughter denies significant weight loss or smoking history. Ultrasound shows layering debris in the bladder lumen. Needs follow-up UA once treated for UTI. If symptoms persistent will need referral to urology.     Hypothyroidism Continue Synthroid.    Acute renal failure superimposed on stage 3 chronic kidney disease ([place]) Secondary to UTI and dehydration. Daughter reports patient not drinking enough fluid. Monitor with IV fluids. Hold HCTZ and ARB.    Diabetes mellitus with complication ([place]) Continue home dose insulin at a lower dose and sliding scale coverage.    Alzheimer's dementia Stable with moderate dementia. Continue Namenda.  Generalized weakness PT evaluation  Hyperkalemia Resolved. Hold ARB.    Pressure ulcer  Fibromyalgia  chronic low-dose prednisone  Code Status : DO NOT RESUSCITATE  Family Communication  : Daughter at bedside  Disposition Plan  : Likely home in the next 48 hours  Barriers For Discharge : Active symptoms  Consults  : None  Procedures  : Renal ultrasound  DVT Prophylaxis  :  SCDs  Lab Results  Component Value Date   PLT 225 04/23/2016    Antibiotics  :    Anti-infectives    Start     Dose/Rate Route Frequency Ordered Stop   04/24/16 2030  cefTRIAXone (ROCEPHIN) 1 g in dextrose 5 % 50 mL IVPB     1 g 100 mL/hr over 30 Minutes Intravenous Every 24 hours 04/23/16 2025     04/23/16 1745  cefTRIAXone (ROCEPHIN) 1 g in dextrose 5 % 50 mL IVPB     1 g 100 mL/hr over 30 Minutes Intravenous  Once 04/23/16 1742 04/23/16 1901        Objective:   Filed Vitals:   04/23/16 2328 04/24/16 0356 04/24/16 0704 04/24/16 1000  BP: 142/53 108/52  136/48  Pulse: 66 57    Temp: 98.8 F (37.1 C) 97.7 F (36.5 C)  98 F (36.7 C)  TempSrc: Oral Oral  Oral  Resp: 16 18  18   Height: 5\' 3"  (1.6 m)  5\' 4"  (1.626 m)   Weight: 59.4 kg (130 lb 15.3 oz)  55.5 kg (122 lb 5.7 oz)   SpO2:  97%  99%    Wt Readings from Last 3 Encounters:  04/24/16 55.5 kg (122 lb 5.7 oz)  03/20/16 63.322 kg (139 lb 9.6 oz)  03/10/16 63.504 kg (140 lb)     Intake/Output Summary (Last 24 hours) at 04/24/16 1426 Last data filed at 04/24/16 1055  Gross per 24 hour  Intake  1201.25 ml  Output      0 ml  Net 1201.25 ml     Physical Exam  Gen: not in distress HEENT: no pallor, moist mucosa, supple neck Chest: clear b/l, no added sounds CVS: N S1&S2, no murmurs, rubs or gallop GI: soft, NT, ND, BS+, No CVA tenderness Musculoskeletal: warm, no edema CNS: AAOX2, non focal    Data Review:    CBC  Recent Labs Lab 04/23/16 1815  WBC 8.8  HGB 11.4*  HCT 34.3*  PLT 225  MCV 92.0  MCH 30.6  MCHC 33.2  RDW 13.8  LYMPHSABS 0.8  MONOABS 0.4  EOSABS 0.0  BASOSABS 0.0    Chemistries   Recent Labs Lab 04/23/16 1815 04/24/16 0504  NA 133* 136  K 5.2* 4.2  CL 99* 104  CO2 19* 23  GLUCOSE 187* 56*  BUN 93* 77*  CREATININE 2.32* 1.81*  CALCIUM 9.7 8.5*  AST 18  --   ALT 12*  --   ALKPHOS 52  --   BILITOT 0.6  --    ------------------------------------------------------------------------------------------------------------------ No results for input(s): CHOL, HDL, LDLCALC, TRIG, CHOLHDL, LDLDIRECT in the last 72 hours.  Lab Results  Component Value Date   HGBA1C 8.4* 03/10/2016   ------------------------------------------------------------------------------------------------------------------  Recent Labs  04/24/16 0504  TSH 0.608   ------------------------------------------------------------------------------------------------------------------ No results for input(s): VITAMINB12, FOLATE, FERRITIN, TIBC, IRON, RETICCTPCT in the last 72 hours.  Coagulation profile No results for input(s): INR, PROTIME in the last 168 hours.  No results for input(s): DDIMER in the last 72 hours.  Cardiac Enzymes No results for input(s): CKMB, TROPONINI, MYOGLOBIN in the last 168 hours.  Invalid input(s): CK ------------------------------------------------------------------------------------------------------------------ No  results found for: BNP  Inpatient Medications  Scheduled Meds: . amLODipine  10 mg Oral Daily  . aspirin  81 mg  Oral Daily  . cefTRIAXone (ROCEPHIN)  IV  1 g Intravenous Q24H  . fentaNYL  50 mcg Transdermal Q72H  . ferrous sulfate  325 mg Oral Q breakfast  . insulin aspart  0-9 Units Subcutaneous Q4H  . insulin NPH Human  9 Units Subcutaneous Q breakfast  . levothyroxine  100 mcg Oral QAC breakfast  . memantine  10 mg Oral BID  . predniSONE  5 mg Oral Q breakfast  . QUEtiapine  50 mg Oral QHS  . sodium chloride  500 mL Intravenous Once  . tiotropium  18 mcg Inhalation Daily   Continuous Infusions: . sodium chloride 100 mL/hr at 04/24/16 1228   PRN Meds:.HYDROcodone-acetaminophen  Micro Results No results found for this or any previous visit (from the past 240 hour(s)).  Radiology Reports US Renal  04/23/2016  CLINICAL DATA:  Initial evaluation for gross hematuria. EXAM: RENAL / URINARY TRACT ULTRASOUND COMPLETE COMPARISON:  Prior ultrasound from 06/12/2015. FINDINGS: Right Kidney: Length: 9.5 cm. Increased echogenicity, suggestive of medical renal disease. No mass or hydronephrosis visualized. Somewhat lobulated contour. Left Kidney: Length: 10.3 cm. Increased echogenicity, suggestive of medical renal disease. No mass or hydronephrosis visualized. Somewhat lobulated contour. Bladder: Small amount of debris within the bladder lumen. Bilateral ureteral jets visualized. IMPRESSION: 1. Increased echogenicity within the kidneys bilaterally, suggestive of medical renal disease. No hydronephrosis. 2. Small amount of layering debris within the bladder lumen. Bilateral ureteral jets were present. Electronically Signed   By: Jeannine Boga M.D.   On: 04/23/2016 23:13    Time Spent in minutes  25   Louellen Molder M.D on 04/24/2016 at 2:26 PM  Between 7am to 7pm - Pager - [phone]  After 7pm go to [email or web address] - password [facility]  [facility] -  Office  [phone]

## 2016-04-24 NOTE — Progress Notes (Signed)
WOC wound consult note Reason for Consult:Excoriated areas to buttocks Wound type: Noted stage 2 pressure injury to right upper buttocks and blanchable redness to right and left buttocks Pressure Ulcer POA: Yes Measurement:0.2 cm x 0.2 cm Wound [id number] Drainage (amount, consistency, odor) No drainage Periwound: Blanchable redness Dressing procedure/placement/frequency: Apply  Allevyn sacral dressing and change every five days and prn if soiled.   Re consult if needed, will not follow at this time. Thanks, Melba Coon MSN, RN, [employer]

## 2016-04-24 NOTE — Care Management Note (Addendum)
Case Management Note  Patient Details  Name: Michelle [name] MRN: [id number] Date of Birth: 03-Apr-1932  Subjective/Objective:          CM following for progression and d/c planning.          Action/Plan: 04/24/2016 Chart reviewed , noted pt lives at home with family. Will follow for progression and assist with d/c needs as identified. Will continue to follow.   Expected Discharge Date:                  Expected Discharge Plan:  [place]  In-House Referral:  NA  Discharge planning Services  CM Consult  Post Acute Care Choice:  NA Choice offered to:  NA  DME Arranged:    DME Agency:     HH Arranged:    HH Agency:     Status of Service:  In process, will continue to follow  Medicare Important Message Given:  Yes Date Medicare IM Given:    Medicare IM give by:    Date Additional Medicare IM Given:    Additional Medicare Important Message give by:     If discussed at [place] of Stay Meetings, dates discussed:    Additional Comments:  Adron Bene, RN 04/24/2016, 4:00 PM

## 2016-04-24 NOTE — Care Management Important Message (Signed)
Important Message  Patient Details  Name: Michelle [name] MRN: [id number] Date of Birth: 1932-07-02   Medicare Important Message Given:  Yes    [name] 04/24/2016, 10:00 AM

## 2016-04-25 DIAGNOSIS — E0865 Diabetes mellitus due to underlying condition with hyperglycemia: Secondary | ICD-10-CM

## 2016-04-25 DIAGNOSIS — Z794 Long term (current) use of insulin: Secondary | ICD-10-CM

## 2016-04-25 LAB — CBC
HEMATOCRIT: 31.8 % — AB (ref 36.0–46.0)
HEMOGLOBIN: 10.7 g/dL — AB (ref 12.0–15.0)
MCH: 30.7 pg (ref 26.0–34.0)
MCHC: 33.6 g/dL (ref 30.0–36.0)
MCV: 91.1 fL (ref 78.0–100.0)
Platelets: 175 10*3/uL (ref 150–400)
RBC: 3.49 MIL/uL — ABNORMAL LOW (ref 3.87–5.11)
RDW: 13.9 % (ref 11.5–15.5)
WBC: 5.8 10*3/uL (ref 4.0–10.5)

## 2016-04-25 LAB — BASIC METABOLIC PANEL
Anion gap: 10 (ref 5–15)
BUN: 46 mg/dL — ABNORMAL HIGH (ref 6–20)
CALCIUM: 8.7 mg/dL — AB (ref 8.9–10.3)
CHLORIDE: 107 mmol/L (ref 101–111)
CO2: 20 mmol/L — AB (ref 22–32)
CREATININE: 1.2 mg/dL — AB (ref 0.44–1.00)
GFR calc Af Amer: 48 mL/min — ABNORMAL LOW (ref >60)
GFR calc non Af Amer: 41 mL/min — ABNORMAL LOW (ref >60)
GLUCOSE: 208 mg/dL — AB (ref 65–99)
Potassium: 4.4 mmol/L (ref 3.5–5.1)
Sodium: 137 mmol/L (ref 135–145)

## 2016-04-25 LAB — GLUCOSE, CAPILLARY
GLUCOSE-CAPILLARY: 314 mg/dL — AB (ref 65–99)
Glucose-Capillary: 109 mg/dL — ABNORMAL HIGH (ref 65–99)
Glucose-Capillary: 167 mg/dL — ABNORMAL HIGH (ref 65–99)
Glucose-Capillary: 179 mg/dL — ABNORMAL HIGH (ref 65–99)
Glucose-Capillary: 202 mg/dL — ABNORMAL HIGH (ref 65–99)
Glucose-Capillary: 204 mg/dL — ABNORMAL HIGH (ref 65–99)
Glucose-Capillary: 208 mg/dL — ABNORMAL HIGH (ref 65–99)

## 2016-04-25 MED ORDER — INSULIN ASPART 100 UNIT/ML ~~LOC~~ SOLN
0.0000 [IU] | Freq: Three times a day (TID) | SUBCUTANEOUS | Status: DC
  Administered 2016-04-25: 5 [IU] via SUBCUTANEOUS

## 2016-04-25 MED ORDER — INSULIN ASPART 100 UNIT/ML ~~LOC~~ SOLN
0.0000 [IU] | Freq: Every day | SUBCUTANEOUS | Status: DC
  Administered 2016-04-25: 2 [IU] via SUBCUTANEOUS

## 2016-04-25 MED ORDER — POLYETHYLENE GLYCOL 3350 17 G PO PACK
17.0000 g | PACK | Freq: Every day | ORAL | Status: DC
  Administered 2016-04-25 – 2016-04-26 (×2): 17 g via ORAL
  Filled 2016-04-25 (×2): qty 1

## 2016-04-25 NOTE — Progress Notes (Signed)
PROGRESS NOTE                                                                                                                                                                                                             Patient Demographics:    Michelle [name], is a 82 y.o. female, DOB - 03-03-1932, [id number]  Admit date - 04/23/2016   Admitting Physician Etta Quill, DO  Outpatient Primary MD for the patient is [facility] [name], MD  LOS - 2  Outpatient Specialists: none  Chief Complaint  Patient presents with  . Hematuria       Brief Narrative   83 year old with history of thousand was dementia, type 2 diabetes mellitus, CK disease stage III (baseline creatinine of 1.4) and hypertension was brought to the ED by her daughter with dysuria and hematuria for the past 2 days. Patient also complained of left flank pain on the day of admission. Patient was discharged from the hospital about 6 weeks ago and since then was complaining of increased fatigue. Denied any nausea vomiting or fever. In the ED she was found to have acute on chronic kidney injury with gross hematuria and pyuria. Admitted to hospital service for further management.   Subjective:   Reports some abdominal discomfort, No further Hematuria or dysuria   Assessment  & Plan :    Principal Problem: UTI with? Acute pyelonephritis and gross hematuria -empiric Rocephin. cx growing GNR. Sensitivity pending.. Renal ultrasound shows medical renal disease without hydronephrosis and small amount of layering debris within the bladder lumen. Continue IV fluids. Supportive care with antiemetics.  Active Problems: Gross hematuria Suspect due to UTI. No prior history. Daughter denies significant weight loss or smoking history. Ultrasound shows layering debris in the bladder lumen. Needs follow-up UA once treated for UTI. If symptoms persistent will need referral to  urology.    Acute renal failure superimposed on stage 3 chronic kidney disease ([place]) Secondary to UTI and dehydration. Daughter reports patient not drinking enough fluid. Improved with IV fluids. Hold HCTZ and ARB.    Diabetes mellitus with  nephropathy ([facility]) cbg upto 300s, increase NPH to 15 units with moderate SSI.    Hypothyroidism Continue Synthroid.     Alzheimer's dementia Stable with mild to moderate dementia. Continue Namenda.  Generalized weakness PT evaluation  Hyperkalemia Resolved. Hold ARB.  Pressure ulcer  Fibromyalgia chronic low-dose prednisone  Code Status : DO NOT RESUSCITATE  Family Communication  : Daughter at bedside  Disposition Plan  : Likely home tomorrow once cleared by PT and final sensitivity available.  Barriers For Discharge : Improving symptoms, PT evaluation  Consults  : None  Procedures  : Renal ultrasound  DVT Prophylaxis  :  SCDs  Lab Results  Component Value Date   PLT 175 04/25/2016    Antibiotics  :    Anti-infectives    Start     Dose/Rate Route Frequency Ordered Stop   04/24/16 2030  cefTRIAXone (ROCEPHIN) 1 g in dextrose 5 % 50 mL IVPB     1 g 100 mL/hr over 30 Minutes Intravenous Every 24 hours 04/23/16 2025     04/23/16 1745  cefTRIAXone (ROCEPHIN) 1 g in dextrose 5 % 50 mL IVPB     1 g 100 mL/hr over 30 Minutes Intravenous  Once 04/23/16 1742 04/23/16 1901        Objective:   Filed Vitals:   04/24/16 1950 04/25/16 0454 04/25/16 0759 04/25/16 0806  BP: 125/45 127/78 116/63   Pulse: 55 60 55   Temp: 98.3 F (36.8 C) 98.2 F (36.8 C) 98.4 F (36.9 C)   TempSrc: Oral Oral Oral   Resp: 19 20 18    Height:      Weight: 57.7 kg (127 lb 3.3 oz)     SpO2: 100% 100% 97% 97%    Wt Readings from Last 3 Encounters:  04/24/16 57.7 kg (127 lb 3.3 oz)  03/20/16 63.322 kg (139 lb 9.6 oz)  03/10/16 63.504 kg (140 lb)     Intake/Output Summary (Last 24 hours) at 04/25/16 1135 Last data filed at 04/25/16 0612   Gross per 24 hour  Intake      0 ml  Output      0 ml  Net      0 ml     Physical Exam  Gen: not in distress HEENT:  moist mucosa, supple neck Chest: clear b/l, no added sounds CVS: N S1&S2, no murmurs, rubs or gallop GI: soft,  ND, BS+, Mild left periumbilical tenderness Musculoskeletal: warm, no edema CNS: AAOX2, non focal    Data Review:    CBC  Recent Labs Lab 04/23/16 1815 04/25/16 0903  WBC 8.8 5.8  HGB 11.4* 10.7*  HCT 34.3* 31.8*  PLT 225 175  MCV 92.0 91.1  MCH 30.6 30.7  MCHC 33.2 33.6  RDW 13.8 13.9  LYMPHSABS 0.8  --   MONOABS 0.4  --   EOSABS 0.0  --   BASOSABS 0.0  --     Chemistries   Recent Labs Lab 04/23/16 1815 04/24/16 0504 04/25/16 0903  NA 133* 136 137  K 5.2* 4.2 4.4  CL 99* 104 107  CO2 19* 23 20*  GLUCOSE 187* 56* 208*  BUN 93* 77* 46*  CREATININE 2.32* 1.81* 1.20*  CALCIUM 9.7 8.5* 8.7*  AST 18  --   --   ALT 12*  --   --   ALKPHOS 52  --   --   BILITOT 0.6  --   --    ------------------------------------------------------------------------------------------------------------------ No results for input(s): CHOL, HDL, LDLCALC, TRIG, CHOLHDL, LDLDIRECT in the last 72 hours.  Lab Results  Component Value Date   HGBA1C 8.4* 03/10/2016   ------------------------------------------------------------------------------------------------------------------  Recent Labs  04/24/16 0504  TSH 0.608   ------------------------------------------------------------------------------------------------------------------ No results for input(s): VITAMINB12, FOLATE, FERRITIN, TIBC,  IRON, RETICCTPCT in the last 72 hours.  Coagulation profile No results for input(s): INR, PROTIME in the last 168 hours.  No results for input(s): DDIMER in the last 72 hours.  Cardiac Enzymes No results for input(s): CKMB, TROPONINI, MYOGLOBIN in the last 168 hours.  Invalid input(s):  CK ------------------------------------------------------------------------------------------------------------------ No results found for: BNP  Inpatient Medications  Scheduled Meds: . amLODipine  10 mg Oral Daily  . aspirin  81 mg Oral Daily  . cefTRIAXone (ROCEPHIN)  IV  1 g Intravenous Q24H  . fentaNYL  50 mcg Transdermal Q72H  . ferrous sulfate  325 mg Oral Q breakfast  . insulin aspart  0-9 Units Subcutaneous Q4H  . insulin NPH Human  15 Units Subcutaneous Q breakfast  . levothyroxine  100 mcg Oral QAC breakfast  . memantine  10 mg Oral BID  . predniSONE  5 mg Oral Q breakfast  . QUEtiapine  50 mg Oral QHS  . sodium chloride  500 mL Intravenous Once  . tiotropium  18 mcg Inhalation Daily   Continuous Infusions: . sodium chloride 100 mL (04/25/16 1105)   PRN Meds:.HYDROcodone-acetaminophen  Micro Results Recent Results (from the past 240 hour(s))  Urine culture     Status: Abnormal (Preliminary result)   Collection Time: 04/23/16  2:45 PM  Result Value Ref Range Status   Specimen Description URINE, RANDOM  Final   Special Requests NONE  Final   Culture >=100,000 COLONIES/mL GRAM NEGATIVE RODS (A)  Final   Report Status PENDING  Incomplete    Radiology Reports US Renal  04/23/2016  CLINICAL DATA:  Initial evaluation for gross hematuria. EXAM: RENAL / URINARY TRACT ULTRASOUND COMPLETE COMPARISON:  Prior ultrasound from 06/12/2015. FINDINGS: Right Kidney: Length: 9.5 cm. Increased echogenicity, suggestive of medical renal disease. No mass or hydronephrosis visualized. Somewhat lobulated contour. Left Kidney: Length: 10.3 cm. Increased echogenicity, suggestive of medical renal disease. No mass or hydronephrosis visualized. Somewhat lobulated contour. Bladder: Small amount of debris within the bladder lumen. Bilateral ureteral jets visualized. IMPRESSION: 1. Increased echogenicity within the kidneys bilaterally, suggestive of medical renal disease. No hydronephrosis. 2. Small  amount of layering debris within the bladder lumen. Bilateral ureteral jets were present. Electronically Signed   By: Jeannine Boga M.D.   On: 04/23/2016 23:13    Time Spent in minutes  25   Louellen Molder M.D on 04/25/2016 at 11:35 AM  Between 7am to 7pm - Pager - [phone]  After 7pm go to [email or web address] - password [facility]  [facility] -  Office  [phone]

## 2016-04-25 NOTE — Care Management Important Message (Signed)
Important Message  Patient Details  Name: Michelle [name] MRN: [id number] Date of Birth: 06/14/1932   Medicare Important Message Given:  Yes    [name] 04/25/2016, 9:31 AM

## 2016-04-26 DIAGNOSIS — B9620 Unspecified Escherichia coli [E. coli] as the cause of diseases classified elsewhere: Secondary | ICD-10-CM | POA: Diagnosis present

## 2016-04-26 DIAGNOSIS — E039 Hypothyroidism, unspecified: Secondary | ICD-10-CM

## 2016-04-26 DIAGNOSIS — E118 Type 2 diabetes mellitus with unspecified complications: Secondary | ICD-10-CM

## 2016-04-26 DIAGNOSIS — N390 Urinary tract infection, site not specified: Secondary | ICD-10-CM

## 2016-04-26 LAB — URINE CULTURE: Culture: >=100000 — AB

## 2016-04-26 LAB — GLUCOSE, CAPILLARY
Glucose-Capillary: 107 mg/dL — ABNORMAL HIGH (ref 65–99)
Glucose-Capillary: 95 mg/dL (ref 65–99)

## 2016-04-26 MED ORDER — CEPHALEXIN 500 MG PO CAPS: 500.0000 mg | ORAL_CAPSULE | Freq: Two times a day (BID) | ORAL | Status: AC

## 2016-04-26 MED ORDER — ENSURE ENLIVE PO LIQD: 237.0000 mL | Freq: Two times a day (BID) | ORAL | Status: DC

## 2016-04-26 NOTE — Evaluation (Signed)
Physical Therapy Evaluation Patient Details Name: Michelle [name] MRN: [id number] DOB: 1931/11/16 Today's Date: 04/26/2016   History of Present Illness  Patient is a 82 y/o female with hx of HTN, CKD, COPD, CAD, hypothyroidism, PMR, Alzhemer's dementia presents with dysuria and hematuria.Found to have acute on chronic kidney injury with gross hematuria and pyuria.  Clinical Impression  Patient presents with generalized weakness from hospitalization and baseline cognitive deficits from hx of dementia. Tolerated gait training with Min guard assist for safety. Daughter reports she is with patient 95% of the time at home. Pt is a fall risk. Encouraged ambulation multiple times per day to improve strength. Will follow acutely to maximize independence and mobility prior to return home.    Follow Up Recommendations Home health PT;Supervision for mobility/OOB    Equipment Recommendations  None recommended by PT    Recommendations for Other Services       Precautions / Restrictions Precautions Precautions: Fall Restrictions Weight Bearing Restrictions: No      Mobility  Bed Mobility Overal bed mobility: Needs Assistance Bed Mobility: Supine to Sit     Supine to sit: Min guard;HOB elevated     General bed mobility comments: Cues for technique. Use of rail for support.  Transfers Overall transfer level: Needs assistance Equipment used: Rolling walker (2 wheeled) Transfers: Sit to/from [facility] Sit to Stand: Min guard Stand pivot transfers: Min assist       General transfer comment: Min guard for safety. SPT bed to [facility] pushing RW out of the way and furniture walking. Transferred to chair post ambulation.  Ambulation/Gait Ambulation/Gait assistance: Min guard Ambulation Distance (Feet): 200 Feet Assistive device: Rolling walker (2 wheeled) Gait Pattern/deviations: Step-through pattern;Decreased stride length;Trunk flexed Gait velocity: decreased Gait  velocity interpretation: <1.8 ft/sec, indicative of risk for recurrent falls General Gait Details: Slow, steady gait. Cues for RW management.  Stairs            Wheelchair Mobility    Modified Rankin (Stroke Patients Only)       Balance Overall balance assessment: Needs assistance;History of Falls Sitting-balance support: Feet supported;No upper extremity supported Sitting balance-Leahy Scale: Good     Standing balance support: During functional activity Standing balance-Leahy Scale: Poor Standing balance comment: Reliant on BUEs for support. Furniture walking to get to [facility].                             Pertinent Vitals/Pain Pain Assessment: No/denies pain    Home Living Family/patient expects to be discharged to:: Private residence Living Arrangements: Children (daughter is there 95% of the time.) Available Help at Discharge: Family;Available 24 hours/day Type of Home: House Home Access: Ramped entrance     Home Layout: One level Home Equipment: Walker - 2 wheels;Cane - single point;Shower seat      Prior Function Level of Independence: Independent with assistive device(s)         Comments: Uses RW at home; does not cook or drive. Pt not a great historian. Per prior notes from 1 month ago, daughter normally stays there with her during the day but leaves once she gets her in bed and then arrives before pt gets out of bed     Hand Dominance   Dominant Hand: Right    Extremity/Trunk Assessment   Upper Extremity Assessment: Defer to OT evaluation           Lower Extremity Assessment: Generalized weakness  Communication   Communication: No difficulties  Cognition Arousal/Alertness: Awake/alert Behavior During Therapy: WFL for tasks assessed/performed Overall Cognitive Status: History of cognitive impairments - at baseline                      General Comments General comments (skin integrity, edema, etc.): Daughter  present towards middle end of session.     Exercises        Assessment/Plan    PT Assessment Patient needs continued PT services  PT Diagnosis Generalized weakness;Altered mental status   PT Problem List Decreased strength;Decreased activity tolerance;Decreased balance;Decreased mobility;Decreased safety awareness;Decreased cognition  PT Treatment Interventions Balance training;Gait training;Functional mobility training;Therapeutic activities;Therapeutic exercise;Patient/family education;Stair training   PT Goals (Current goals can be found in the Care Plan section) Acute Rehab PT Goals Patient Stated Goal: to have a BM PT Goal Formulation: With patient Time For Goal Achievement: 05/10/16 Potential to Achieve Goals: Good    Frequency Min 3X/week   Barriers to discharge        Co-evaluation               End of Session Equipment Utilized During Treatment: Gait belt Activity Tolerance: Patient tolerated treatment well Patient left: in chair;with call bell/phone within reach;with chair alarm set;with family/visitor present Nurse Communication: Mobility status         Time: [id number] PT Time Calculation (min) (ACUTE ONLY): 26 min   Charges:   PT Evaluation $PT Eval Moderate Complexity: 1 Procedure PT Treatments $Gait Training: 8-22 mins   PT G Codes:        Aramis Weil A Jeshawn Melucci 04/26/2016, 10:13 AM  Wray Kearns, PT, DPT [phone]

## 2016-04-26 NOTE — Progress Notes (Signed)
Initial Nutrition Assessment  DOCUMENTATION CODES:   Not applicable  INTERVENTION:  Provide Ensure Enlive po BID, each supplement provides 350 kcal and 20 grams of protein.  Encourage adequate PO intake.   NUTRITION DIAGNOSIS:   Inadequate oral intake related to poor appetite as evidenced by meal completion < 25%.  GOAL:   Patient will meet greater than or equal to 90% of their needs  MONITOR:   PO intake, Supplement acceptance, Weight trends, Labs, I & O's, Skin  REASON FOR ASSESSMENT:   Consult Assessment of nutrition requirement/status  ASSESSMENT:   82 y/o female with hx of HTN, CKD, COPD, CAD, hypothyroidism, PMR, Alzhemer's dementia presents with dysuria and hematuria.Found to have acute on chronic kidney injury with gross hematuria and pyuria.  Pt reports appetite is just "ok". Meal completion has been 0-25%. Daughter at bedside reports she has brought in food from outside [employer]) and pt eat some of it this morning. Pt reports usually consuming 2-3 meals a day at home with Ensure at least 1-2 times daily. Per Epic weight records, pt however with weight loss. Pt with a 8.6% weight loss in 1 month. Reason for weight loss unknown. RD to order Ensure to aid in caloric and protein needs. Pt encouraged to eat her food at meals.   Nutrition-Focused physical exam completed. Findings are no fat depletion, mild muscle depletion, and no edema. Mild depletion likely related to the natural aging process.   Labs and medications reviewed.   Diet Order:  DIET - DYS 1 Room service appropriate?: Yes; Fluid consistency:: Thin  Skin:  Wound (see comment) (Stage 2 pressure ulcer on buttocks)  Last BM:  6/16  Height:   Ht Readings from Last 1 Encounters:  04/24/16 5\' 4"  (1.626 m)    Weight:   Wt Readings from Last 1 Encounters:  04/25/16 127 lb 13.9 oz (58 kg)    Ideal Body Weight:  54.5 kg  BMI:  Body mass index is 21.94 kg/(m^2).  Estimated Nutritional Needs:    Kcal:  1500-1700  Protein:  70-80 grams  Fluid:  1.5 - 1.7 L/day  EDUCATION NEEDS:   No education needs identified at this time  Corrin Parker, MS, RD, LDN Pager # [phone] After hours/ weekend pager # [phone]

## 2016-04-26 NOTE — Discharge Summary (Addendum)
Physician Discharge Summary  Michelle [name] [id number] DOB: 1932/04/16 DOA: 04/23/2016  PCP: [name], MD  Admit date: 04/23/2016 Discharge date: 04/26/2016  Admitted From: Home Disposition:  Home  Recommendations for Outpatient Follow-up:  #1 Follow up with PCP in 1-2 weeks.Please check UA daily outpatient follow-up and if continues to have gross hematuria patient is a referral to urology. #2 Completes antibiotics on 04/30/2016  Home Health: RN/PT Equipment/Devices: None  Discharge Condition: Stable CODE STATUS: Full code Diet recommendation: carb mortified with nutritional supplement      Discharge Diagnoses:  Principal Problem:   E-coli UTI with? Acute pyelonephritis   Active Problems:    Hematuria, gross   Polymyalgia rheumatica (HCC)   Hypothyroidism   Acute renal failure superimposed on stage 3 chronic kidney disease (HCC)   Diabetes mellitus with complication (HCC)   Alzheimer's dementia   Pressure ulcer  Brief Narrative  82 year old with history of mild to moderate dementia, type 2 diabetes mellitus, CKD stage III (baseline creatinine of 1.4) and hypertension was brought to the ED by her daughter with dysuria and hematuria for the past 2 days. Patient also complained of left flank pain on the day of admission. Patient was discharged from the hospital about 6 weeks ago and since then was complaining of increased fatigue. Denied any nausea vomiting or fever. In the ED she was found to have acute on chronic kidney injury with gross hematuria and pyuria. Admitted to hospitalist service for further management.  Hospital course Principal Problem: UTI with? Acute pyelonephritis and gross hematuria Cultures growing Escherichia coli. Pansensitive. Renal ultrasound shows medical renal disease without hydronephrosis and small amount of layering debris within the bladder lumen. Was treated with empiric IV Rocephin. No further dysuria or hematuria. Will discharge  on oral Keflex to complete 7 day course of antibiotic.    Active Problems: Gross hematuria Suspect due to UTI. No prior history. Daughter denies significant weight loss or smoking history. Ultrasound shows layering debris in the bladder lumen. Needs follow-up UA once treated for UTI. If symptoms persistent will need referral to urology.   Acute renal failure superimposed on stage 3 chronic kidney disease ([place]) Secondary to UTI and dehydration. Daughter reports patient not drinking enough fluid. Improved with IV fluids. Held HCTZ and ARB. Now resolved.   Diabetes mellitus with nephropathy ([place]) cbg upto 300s, resume home dose NPH   Hypothyroidism Continue Synthroid.    Alzheimer's dementia Stable with mild to moderate dementia. Continue Namenda.  Generalized weakness Seen by PT and recommended home health. Seen by nutritionist and added supplement.  Hyperkalemia Resolved. ARB held In the hospital is on discharge.   Pressure ulcer  Fibromyalgia On chronic low-dose prednisone    Family Communication : spoke with daughter on 6/16  Disposition Plan : Home with home health PT    Consults : None  Procedures : Renal ultrasound   Discharge Instructions     Medication List    TAKE these medications        amLODipine 10 MG tablet  Commonly known as:  NORVASC  Take 1 tablet (10 mg total) by mouth daily.     aspirin 81 MG tablet  Take 81 mg by mouth daily.     CALCIUM 1200 PO  Take 1 tablet by mouth daily.     cephALEXin 500 MG capsule  Commonly known as:  KEFLEX  Take 1 capsule (500 mg total) by mouth 2 (two) times daily.     feeding supplement (ENSURE ENLIVE)  Liqd  Take 237 mLs by mouth 2 (two) times daily between meals.     fentaNYL 50 MCG/HR  Commonly known as:  DURAGESIC - dosed mcg/hr  Place 50 mcg onto the skin every 3 (three) days.     ferrous sulfate 325 (65 FE) MG tablet  Take 325 mg by mouth daily with breakfast.      hydrochlorothiazide 25 MG tablet  Commonly known as:  HYDRODIURIL  Take 1 tablet by mouth daily.     HYDROcodone-acetaminophen 5-325 MG tablet  Commonly known as:  NORCO/VICODIN  Take 1 tablet by mouth as needed.     insulin lispro 100 UNIT/ML injection  Commonly known as:  HUMALOG  Inject 0-6 Units into the skin 3 (three) times daily as needed for high blood sugar. Patient uses sliding scale     insulin NPH Human 100 UNIT/ML injection  Commonly known as:  HUMULIN N,NOVOLIN N  Inject 18 Units into the skin every morning.     levothyroxine 100 MCG tablet  Commonly known as:  SYNTHROID, LEVOTHROID  TAKE 1 TABLET (100 MCG TOTAL) BY MOUTH DAILY.     memantine 10 MG tablet  Commonly known as:  NAMENDA  TAKE ONE TABLET BY MOUTH TWICE DAILY     OCUVITE PRESERVISION PO  Take 1 capsule by mouth daily.     ONE TOUCH ULTRA TEST test strip  Generic drug:  glucose blood  TEST BS 5-6 TIMES A DAY     predniSONE 5 MG tablet  Commonly known as:  DELTASONE  Take 1 tablet by mouth daily.     QUEtiapine 25 MG tablet  Commonly known as:  SEROQUEL  Take 2 tablets (50 mg total) by mouth at bedtime.     telmisartan 40 MG tablet  Commonly known as:  MICARDIS  Take 1 tablet (40 mg total) by mouth daily.     tiotropium 18 MCG inhalation capsule  Commonly known as:  SPIRIVA HANDIHALER  Place 1 capsule (18 mcg total) into inhaler and inhale daily.     Vitamin D3 2000 units Tabs  Take by mouth.           Follow-up Information    Follow up with [facility].   Why:  home health physical therapy and nurse   Contact information:   [place] [address] [phone]       Follow up with [facility] [name], MD. Schedule an appointment as soon as possible for a visit in 1 week.   Specialty:  Family Medicine   Contact information:   [id number] [place] [address] [phone]      No Known Allergies    Procedures/Studies: US  Renal  04/23/2016  CLINICAL DATA:  Initial evaluation for gross hematuria. EXAM: RENAL / URINARY TRACT ULTRASOUND COMPLETE COMPARISON:  Prior ultrasound from 06/12/2015. FINDINGS: Right Kidney: Length: 9.5 cm. Increased echogenicity, suggestive of medical renal disease. No mass or hydronephrosis visualized. Somewhat lobulated contour. Left Kidney: Length: 10.3 cm. Increased echogenicity, suggestive of medical renal disease. No mass or hydronephrosis visualized. Somewhat lobulated contour. Bladder: Small amount of debris within the bladder lumen. Bilateral ureteral jets visualized. IMPRESSION: 1. Increased echogenicity within the kidneys bilaterally, suggestive of medical renal disease. No hydronephrosis. 2. Small amount of layering debris within the bladder lumen. Bilateral ureteral jets were present. Electronically Signed   By: Jeannine Boga M.D.   On: 04/23/2016 23:13      Subjective:  Discharge Exam: Filed Vitals:   04/26/16 0531 04/26/16 0849  BP: 113/52 149/57  Pulse: 52 51  Temp: 98.4 F (36.9 C) 97.7 F (36.5 C)  Resp: 16 16   Filed Vitals:   04/25/16 2025 04/26/16 0531 04/26/16 0849 04/26/16 1015  BP: 149/48 113/52 149/57   Pulse: 62 52 51   Temp: 98.4 F (36.9 C) 98.4 F (36.9 C) 97.7 F (36.5 C)   TempSrc: Oral Oral Oral   Resp: 18 16 16    Height:      Weight: 58 kg (127 lb 13.9 oz)     SpO2: 97% 99% 98% 97%    General: Pt is alert, awake, not in acute distress Cardiovascular: RRR, S1/S2 +, no rubs, no gallops Respiratory: CTA bilaterally, no wheezing, no rhonchi Abdominal: Soft, NT, ND, bowel sounds + Extremities: no edema, no cyanosis    The results of significant diagnostics from this hospitalization (including imaging, microbiology, ancillary and laboratory) are listed below for reference.     Microbiology: Recent Results (from the past 240 hour(s))  Urine culture     Status: Abnormal   Collection Time: 04/23/16  2:45 PM  Result Value Ref Range  Status   Specimen Description URINE, RANDOM  Final   Special Requests NONE  Final   Culture >=100,000 COLONIES/mL ESCHERICHIA COLI (A)  Final   Report Status 04/26/2016 FINAL  Final   Organism ID, Bacteria ESCHERICHIA COLI (A)  Final      Susceptibility   Escherichia coli - MIC*    AMPICILLIN <=2 SENSITIVE Sensitive     CEFAZOLIN <=4 SENSITIVE Sensitive     CEFTRIAXONE <=1 SENSITIVE Sensitive     CIPROFLOXACIN <=0.25 SENSITIVE Sensitive     GENTAMICIN <=1 SENSITIVE Sensitive     IMIPENEM <=0.25 SENSITIVE Sensitive     NITROFURANTOIN <=16 SENSITIVE Sensitive     TRIMETH/SULFA <=20 SENSITIVE Sensitive     AMPICILLIN/SULBACTAM <=2 SENSITIVE Sensitive     PIP/TAZO <=4 SENSITIVE Sensitive     * >=100,000 COLONIES/mL ESCHERICHIA COLI     Labs: BNP (last 3 results) No results for input(s): BNP in the last 8760 hours. Basic Metabolic Panel:  Recent Labs Lab 04/23/16 1815 04/24/16 0504 04/25/16 0903  NA 133* 136 137  K 5.2* 4.2 4.4  CL 99* 104 107  CO2 19* 23 20*  GLUCOSE 187* 56* 208*  BUN 93* 77* 46*  CREATININE 2.32* 1.81* 1.20*  CALCIUM 9.7 8.5* 8.7*   Liver Function Tests:  Recent Labs Lab 04/23/16 1815  AST 18  ALT 12*  ALKPHOS 52  BILITOT 0.6  PROT 6.8  ALBUMIN 4.2   No results for input(s): LIPASE, AMYLASE in the last 168 hours. No results for input(s): AMMONIA in the last 168 hours. CBC:  Recent Labs Lab 04/23/16 1815 04/25/16 0903  WBC 8.8 5.8  NEUTROABS 7.6  --   HGB 11.4* 10.7*  HCT 34.3* 31.8*  MCV 92.0 91.1  PLT 225 175   Cardiac Enzymes: No results for input(s): CKTOTAL, CKMB, CKMBINDEX, TROPONINI in the last 168 hours. BNP: Invalid input(s): POCBNP CBG:  Recent Labs Lab 04/25/16 1642 04/25/16 2123 04/25/16 2355 04/26/16 0730 04/26/16 1223  GLUCAP 109* 202* 208* 107* 95   D-Dimer No results for input(s): DDIMER in the last 72 hours. Hgb A1c No results for input(s): HGBA1C in the last 72 hours. Lipid Profile No results for  input(s): CHOL, HDL, LDLCALC, TRIG, CHOLHDL, LDLDIRECT in the last 72 hours. Thyroid function studies  Recent Labs  04/24/16 0504  TSH 0.608   Anemia work up No results for input(s): VITAMINB12, FOLATE, FERRITIN, TIBC, IRON, RETICCTPCT in the last 72 hours. Urinalysis    Component Value Date/Time   COLORURINE RED* 04/23/2016 1423   APPEARANCEUR CLOUDY* 04/23/2016 1423   LABSPEC 1.015 04/23/2016 1423   PHURINE 6.5 04/23/2016 1423   GLUCOSEU 250* 04/23/2016 1423   HGBUR LARGE* 04/23/2016 1423   BILIRUBINUR LARGE* 04/23/2016 1423   KETONESUR NEGATIVE 04/23/2016 1423   PROTEINUR >300* 04/23/2016 1423   UROBILINOGEN 1.0 06/12/2015 1937   NITRITE POSITIVE* 04/23/2016 1423   LEUKOCYTESUR LARGE* 04/23/2016 1423   Sepsis Labs Invalid input(s): PROCALCITONIN,  WBC,  LACTICIDVEN Microbiology Recent Results (from the past 240 hour(s))  Urine culture     Status: Abnormal   Collection Time: 04/23/16  2:45 PM  Result Value Ref Range Status   Specimen Description URINE, RANDOM  Final   Special Requests NONE  Final   Culture >=100,000 COLONIES/mL ESCHERICHIA COLI (A)  Final   Report Status 04/26/2016 FINAL  Final   Organism ID, Bacteria ESCHERICHIA COLI (A)  Final      Susceptibility   Escherichia coli - MIC*    AMPICILLIN <=2 SENSITIVE Sensitive     CEFAZOLIN <=4 SENSITIVE Sensitive     CEFTRIAXONE <=1 SENSITIVE Sensitive     CIPROFLOXACIN <=0.25 SENSITIVE Sensitive     GENTAMICIN <=1 SENSITIVE Sensitive     IMIPENEM <=0.25 SENSITIVE Sensitive     NITROFURANTOIN <=16 SENSITIVE Sensitive     TRIMETH/SULFA <=20 SENSITIVE Sensitive     AMPICILLIN/SULBACTAM <=2 SENSITIVE Sensitive     PIP/TAZO <=4 SENSITIVE Sensitive     * >=100,000 COLONIES/mL ESCHERICHIA COLI     Time coordinating discharge: Over 30 minutes  SIGNED:   Louellen Molder, MD  [facility] 04/26/2016, 3:09 PM Pager   If 7PM-7AM, please contact night-coverage [email or web address] Password [email or web address]

## 2016-04-26 NOTE — Progress Notes (Signed)
Pt discharge instructions and prescriptions given. Pt and family verbalized understanding. VSS.  Denies pain. Pt left floor via wheelchair accompanied by staff and family.

## 2016-04-26 NOTE — Progress Notes (Signed)
CM met with pt and pt's daughter in room who confirm they are active with [place].  Cm called [facility] [employer] rep, Dona to notify pt will be discharged with HHRN/PT orders and F2F.  No other CM needs were communicated.

## 2016-04-28 ENCOUNTER — Telehealth: Payer: Self-pay | Admitting: Family Medicine

## 2016-04-28 DIAGNOSIS — B9620 Unspecified Escherichia coli [E. coli] as the cause of diseases classified elsewhere: Secondary | ICD-10-CM | POA: Diagnosis not present

## 2016-04-28 DIAGNOSIS — E1165 Type 2 diabetes mellitus with hyperglycemia: Secondary | ICD-10-CM | POA: Diagnosis not present

## 2016-04-28 DIAGNOSIS — I2510 Atherosclerotic heart disease of native coronary artery without angina pectoris: Secondary | ICD-10-CM | POA: Diagnosis not present

## 2016-04-28 DIAGNOSIS — N183 Chronic kidney disease, stage 3 (moderate): Secondary | ICD-10-CM | POA: Diagnosis not present

## 2016-04-28 DIAGNOSIS — N390 Urinary tract infection, site not specified: Secondary | ICD-10-CM | POA: Diagnosis not present

## 2016-04-28 DIAGNOSIS — I129 Hypertensive chronic kidney disease with stage 1 through stage 4 chronic kidney disease, or unspecified chronic kidney disease: Secondary | ICD-10-CM | POA: Diagnosis not present

## 2016-04-28 NOTE — Telephone Encounter (Signed)
Elmyra Ricks w/ [facility] called for verbal orders to see pt for medication mgmt, HTN and UTI prevention 2x a week for 4 wks, 1x a week for 4 weeks and 2 prn Please call Elmyra Ricks @ [phone]

## 2016-04-30 NOTE — Telephone Encounter (Signed)
Called and LMOVM given VO for pt

## 2016-05-02 DIAGNOSIS — E1165 Type 2 diabetes mellitus with hyperglycemia: Secondary | ICD-10-CM | POA: Diagnosis not present

## 2016-05-02 DIAGNOSIS — B9620 Unspecified Escherichia coli [E. coli] as the cause of diseases classified elsewhere: Secondary | ICD-10-CM | POA: Diagnosis not present

## 2016-05-02 DIAGNOSIS — I129 Hypertensive chronic kidney disease with stage 1 through stage 4 chronic kidney disease, or unspecified chronic kidney disease: Secondary | ICD-10-CM | POA: Diagnosis not present

## 2016-05-02 DIAGNOSIS — I2510 Atherosclerotic heart disease of native coronary artery without angina pectoris: Secondary | ICD-10-CM | POA: Diagnosis not present

## 2016-05-02 DIAGNOSIS — N183 Chronic kidney disease, stage 3 (moderate): Secondary | ICD-10-CM | POA: Diagnosis not present

## 2016-05-02 DIAGNOSIS — N390 Urinary tract infection, site not specified: Secondary | ICD-10-CM | POA: Diagnosis not present

## 2016-05-05 ENCOUNTER — Other Ambulatory Visit: Payer: Medicare Other

## 2016-05-05 ENCOUNTER — Other Ambulatory Visit: Payer: Self-pay | Admitting: Family Medicine

## 2016-05-05 DIAGNOSIS — E1165 Type 2 diabetes mellitus with hyperglycemia: Secondary | ICD-10-CM | POA: Diagnosis not present

## 2016-05-05 DIAGNOSIS — I2510 Atherosclerotic heart disease of native coronary artery without angina pectoris: Secondary | ICD-10-CM | POA: Diagnosis not present

## 2016-05-05 DIAGNOSIS — I129 Hypertensive chronic kidney disease with stage 1 through stage 4 chronic kidney disease, or unspecified chronic kidney disease: Secondary | ICD-10-CM | POA: Diagnosis not present

## 2016-05-05 DIAGNOSIS — R310 Gross hematuria: Secondary | ICD-10-CM

## 2016-05-05 DIAGNOSIS — B9620 Unspecified Escherichia coli [E. coli] as the cause of diseases classified elsewhere: Secondary | ICD-10-CM | POA: Diagnosis not present

## 2016-05-05 DIAGNOSIS — N183 Chronic kidney disease, stage 3 (moderate): Secondary | ICD-10-CM | POA: Diagnosis not present

## 2016-05-05 DIAGNOSIS — N390 Urinary tract infection, site not specified: Secondary | ICD-10-CM | POA: Diagnosis not present

## 2016-05-06 LAB — URINALYSIS, ROUTINE W REFLEX MICROSCOPIC
Bilirubin Urine: NEGATIVE
GLUCOSE, UA: NEGATIVE
Hgb urine dipstick: NEGATIVE
Ketones, ur: NEGATIVE
LEUKOCYTES UA: NEGATIVE
NITRITE: NEGATIVE
PH: 7 (ref 5.0–8.0)
SPECIFIC GRAVITY, URINE: 1.01 (ref 1.001–1.035)

## 2016-05-06 LAB — URINALYSIS, MICROSCOPIC ONLY
Bacteria, UA: NONE SEEN [HPF]
CASTS: NONE SEEN [LPF]
CRYSTALS: NONE SEEN [HPF]
RBC / HPF: NONE SEEN RBC/HPF (ref <=2)
WBC, UA: NONE SEEN WBC/HPF (ref <=5)
YEAST: NONE SEEN [HPF]

## 2016-05-08 DIAGNOSIS — N183 Chronic kidney disease, stage 3 (moderate): Secondary | ICD-10-CM | POA: Diagnosis not present

## 2016-05-08 DIAGNOSIS — I2510 Atherosclerotic heart disease of native coronary artery without angina pectoris: Secondary | ICD-10-CM | POA: Diagnosis not present

## 2016-05-08 DIAGNOSIS — I129 Hypertensive chronic kidney disease with stage 1 through stage 4 chronic kidney disease, or unspecified chronic kidney disease: Secondary | ICD-10-CM | POA: Diagnosis not present

## 2016-05-08 DIAGNOSIS — B9620 Unspecified Escherichia coli [E. coli] as the cause of diseases classified elsewhere: Secondary | ICD-10-CM | POA: Diagnosis not present

## 2016-05-08 DIAGNOSIS — N390 Urinary tract infection, site not specified: Secondary | ICD-10-CM | POA: Diagnosis not present

## 2016-05-08 DIAGNOSIS — E1165 Type 2 diabetes mellitus with hyperglycemia: Secondary | ICD-10-CM | POA: Diagnosis not present

## 2016-05-12 DIAGNOSIS — M47817 Spondylosis without myelopathy or radiculopathy, lumbosacral region: Secondary | ICD-10-CM | POA: Diagnosis not present

## 2016-05-14 DIAGNOSIS — I129 Hypertensive chronic kidney disease with stage 1 through stage 4 chronic kidney disease, or unspecified chronic kidney disease: Secondary | ICD-10-CM | POA: Diagnosis not present

## 2016-05-14 DIAGNOSIS — B9620 Unspecified Escherichia coli [E. coli] as the cause of diseases classified elsewhere: Secondary | ICD-10-CM | POA: Diagnosis not present

## 2016-05-14 DIAGNOSIS — N390 Urinary tract infection, site not specified: Secondary | ICD-10-CM | POA: Diagnosis not present

## 2016-05-14 DIAGNOSIS — I2510 Atherosclerotic heart disease of native coronary artery without angina pectoris: Secondary | ICD-10-CM | POA: Diagnosis not present

## 2016-05-14 DIAGNOSIS — E1165 Type 2 diabetes mellitus with hyperglycemia: Secondary | ICD-10-CM | POA: Diagnosis not present

## 2016-05-14 DIAGNOSIS — N183 Chronic kidney disease, stage 3 (moderate): Secondary | ICD-10-CM | POA: Diagnosis not present

## 2016-05-20 DIAGNOSIS — B9620 Unspecified Escherichia coli [E. coli] as the cause of diseases classified elsewhere: Secondary | ICD-10-CM | POA: Diagnosis not present

## 2016-05-20 DIAGNOSIS — E1165 Type 2 diabetes mellitus with hyperglycemia: Secondary | ICD-10-CM | POA: Diagnosis not present

## 2016-05-20 DIAGNOSIS — N390 Urinary tract infection, site not specified: Secondary | ICD-10-CM | POA: Diagnosis not present

## 2016-05-20 DIAGNOSIS — N183 Chronic kidney disease, stage 3 (moderate): Secondary | ICD-10-CM | POA: Diagnosis not present

## 2016-05-20 DIAGNOSIS — I2510 Atherosclerotic heart disease of native coronary artery without angina pectoris: Secondary | ICD-10-CM | POA: Diagnosis not present

## 2016-05-20 DIAGNOSIS — I129 Hypertensive chronic kidney disease with stage 1 through stage 4 chronic kidney disease, or unspecified chronic kidney disease: Secondary | ICD-10-CM | POA: Diagnosis not present

## 2016-05-22 ENCOUNTER — Ambulatory Visit (INDEPENDENT_AMBULATORY_CARE_PROVIDER_SITE_OTHER): Payer: Medicare Other | Admitting: Family Medicine

## 2016-05-22 ENCOUNTER — Encounter: Payer: Self-pay | Admitting: Family Medicine

## 2016-05-22 VITALS — BP 100/58 | HR 60 | Temp 99.1°F | Resp 12 | Wt 121.0 lb

## 2016-05-22 DIAGNOSIS — R319 Hematuria, unspecified: Secondary | ICD-10-CM

## 2016-05-22 DIAGNOSIS — Z09 Encounter for follow-up examination after completed treatment for conditions other than malignant neoplasm: Secondary | ICD-10-CM

## 2016-05-22 DIAGNOSIS — E119 Type 2 diabetes mellitus without complications: Secondary | ICD-10-CM | POA: Diagnosis not present

## 2016-05-22 DIAGNOSIS — I10 Essential (primary) hypertension: Secondary | ICD-10-CM | POA: Diagnosis not present

## 2016-05-22 DIAGNOSIS — IMO0001 Reserved for inherently not codable concepts without codable children: Secondary | ICD-10-CM

## 2016-05-22 DIAGNOSIS — Z794 Long term (current) use of insulin: Secondary | ICD-10-CM | POA: Diagnosis not present

## 2016-05-22 LAB — URINALYSIS, MICROSCOPIC ONLY
Casts: NONE SEEN [LPF]
Crystals: NONE SEEN [HPF]
Yeast: NONE SEEN [HPF]

## 2016-05-22 LAB — URINALYSIS, ROUTINE W REFLEX MICROSCOPIC
Bilirubin Urine: NEGATIVE
Glucose, UA: NEGATIVE
Ketones, ur: NEGATIVE
NITRITE: NEGATIVE
PH: 6 (ref 5.0–8.0)
Specific Gravity, Urine: <1.005 (ref 1.001–1.035)

## 2016-05-22 NOTE — Progress Notes (Signed)
Subjective:    Patient ID: Michelle [name], female    DOB: 1932-02-05, 82 y.o.   MRN: [id number]  HPI Recently discharge dfrom hospital after E. Coli UTI:  Discharge Diagnoses:  Principal Problem:  E-coli UTI with? Acute pyelonephritis   Active Problems:  Hematuria, gross  Polymyalgia rheumatica (HCC)  Hypothyroidism  Acute renal failure superimposed on stage 3 chronic kidney disease (HCC)  Diabetes mellitus with complication (HCC)  Alzheimer's dementia  Pressure ulcer  Brief Narrative  82 year old with history of mild to moderate dementia, type 2 diabetes mellitus, CKD stage III (baseline creatinine of 1.4) and hypertension was brought to the ED by her daughter with dysuria and hematuria for the past 2 days. Patient also complained of left flank pain on the day of admission. Patient was discharged from the hospital about 6 weeks ago and since then was complaining of increased fatigue. Denied any nausea vomiting or fever. In the ED she was found to have acute on chronic kidney injury with gross hematuria and pyuria. Admitted to hospitalist service for further management.  Hospital course Principal Problem: UTI with? Acute pyelonephritis and gross hematuria Cultures growing Escherichia coli. Pansensitive. Renal ultrasound shows medical renal disease without hydronephrosis and small amount of layering debris within the bladder lumen. Was treated with empiric IV Rocephin. No further dysuria or hematuria. Will discharge on oral Keflex to complete 7 day course of antibiotic.    Active Problems: Gross hematuria Suspect due to UTI. No prior history. Daughter denies significant weight loss or smoking history. Ultrasound shows layering debris in the bladder lumen. Needs follow-up UA once treated for UTI. If symptoms persistent will need referral to urology.   Acute renal failure superimposed on stage 3 chronic kidney disease ([place]) Secondary to UTI and dehydration. Daughter  reports patient not drinking enough fluid. Improved with IV fluids. Held HCTZ and ARB. Now resolved.   Diabetes mellitus with nephropathy ([place]) cbg upto 300s, resume home dose NPH   Hypothyroidism Continue Synthroid.    Alzheimer's dementia Stable with mild to moderate dementia. Continue Namenda.  Generalized weakness Seen by PT and recommended home health. Seen by nutritionist and added supplement.  Hyperkalemia Resolved. ARB held In the hospital is on discharge.   Pressure ulcer  Fibromyalgia On chronic low-dose prednisone   Since discharge from the hospital, the patient's daughter reports continued occasional hematuria. It is only 1 or 2 drops in the toilet. Urinalysis today is negative. She is here to discuss follow-up with urology. She has not had a CT scan of the kidneys. She continues to suffer from dementia and her level of confusion waxes and wanes. She is overdue to check a hemoglobin A1c. She is also due to recheck her creatinine. Upon admission to the hospital, her creatinine was 2.8. At discharge it was 1.2. Her blood pressure today is low at 100/58. Her appetite waxes and wanes. At times she does not drink enough fluid. Daughter also reports soft stools that are poorly formed and lead to fecal incontinence but no overt diarrhea. This predates her hospital admission. The predates antibiotics Past Medical History  Diagnosis Date  . Type II or unspecified type diabetes mellitus without mention of complication, not stated as uncontrolled   . Dyslipidemia   . Asthma   . Arthritis   . Gallstones   . COPD (chronic obstructive pulmonary disease) ([place])   . Interstitial cystitis   . UTI (lower urinary tract infection)   . Malaise and fatigue   . Memory  loss   . Coronary atherosclerosis   . Systemic hypertension   . Polymyalgia rheumatica ([place])   . Hypothyroidism   . History of fractured kneecap    Past Surgical History  Procedure Laterality Date  . Lumbar disc  surgery    . Total abdominal hysterectomy    . Rotator cuff repair    . Tonsillectomy    . Cervical spine surgery    . Cholecystectomy    . Hernia repair    . Cardiac catheterization  01/29/2006    10% luminal irregularity mid LAD  . Cardiac catheterization  03/15/2001    No significant CAD, no evidence of renal artery stenosis, systemic hypertenion  . US echocardiography  09/14/2007    mild LVH, LA mildly dilated,mild mitral annular calcification, AOV mildly sclerotic, aortic root sclerosis/ca+  . Nm myocar perf wall motion  08/17/2009    normal   Current Outpatient Prescriptions on File Prior to Visit  Medication Sig Dispense Refill  . amLODipine (NORVASC) 10 MG tablet Take 1 tablet (10 mg total) by mouth daily. 90 tablet 3  . aspirin 81 MG tablet Take 81 mg by mouth daily.    . Calcium Carbonate-Vit D-Min (CALCIUM 1200 PO) Take 1 tablet by mouth daily.     . Cholecalciferol (VITAMIN D3) 2000 units TABS Take by mouth.    . feeding supplement, ENSURE ENLIVE, (ENSURE ENLIVE) LIQD Take 237 mLs by mouth 2 (two) times daily between meals. 237 mL 12  . fentaNYL (DURAGESIC - DOSED MCG/HR) 50 MCG/HR Place 50 mcg onto the skin every 3 (three) days.    . ferrous sulfate 325 (65 FE) MG tablet Take 325 mg by mouth daily with breakfast.    . hydrochlorothiazide (HYDRODIURIL) 25 MG tablet Take 1 tablet by mouth daily.  2  . HYDROcodone-acetaminophen (NORCO/VICODIN) 5-325 MG per tablet Take 1 tablet by mouth as needed.     . insulin lispro (HUMALOG) 100 UNIT/ML injection Inject 0-6 Units into the skin 3 (three) times daily as needed for high blood sugar. Patient uses sliding scale    . insulin NPH (HUMULIN N,NOVOLIN N) 100 UNIT/ML injection Inject 18 Units into the skin every morning.     Marland Kitchen levothyroxine (SYNTHROID, LEVOTHROID) 100 MCG tablet TAKE 1 TABLET (100 MCG TOTAL) BY MOUTH DAILY. 90 tablet 1  . memantine (NAMENDA) 10 MG tablet TAKE ONE TABLET BY MOUTH TWICE DAILY 180 tablet 4  . Multiple  Vitamins-Minerals (OCUVITE PRESERVISION PO) Take 1 capsule by mouth daily.    . ONE TOUCH ULTRA TEST test strip TEST BS 5-6 TIMES A DAY 200 each 6  . predniSONE (DELTASONE) 5 MG tablet Take 1 tablet by mouth daily.  3  . QUEtiapine (SEROQUEL) 25 MG tablet Take 2 tablets (50 mg total) by mouth at bedtime. 60 tablet 11  . telmisartan (MICARDIS) 40 MG tablet Take 1 tablet (40 mg total) by mouth daily. 90 tablet 3  . tiotropium (SPIRIVA HANDIHALER) 18 MCG inhalation capsule Place 1 capsule (18 mcg total) into inhaler and inhale daily. 90 capsule 3   No current facility-administered medications on file prior to visit.   No Known Allergies Social History   Social History  . Marital Status: Widowed    Spouse Name: N/A  . Number of Children: 4  . Years of Education: 12   Occupational History  . retired    Social History Main Topics  . Smoking status: Never Smoker   . Smokeless tobacco: Never Used  . Alcohol  Use: No  . Drug Use: No  . Sexual Activity: Not on file   Other Topics Concern  . Not on file   Social History Narrative   Patient lives at home alone. Widowed.   Retired.   Education High school   Right handed   Caffeine- some times coffee and soda one cup.     Review of Systems  All other systems reviewed and are negative.      Objective:   Physical Exam  Cardiovascular: Normal rate, regular rhythm and normal heart sounds.   Pulmonary/Chest: Effort normal and breath sounds normal. No respiratory distress. She has no wheezes. She has no rales.  Abdominal: Soft. Bowel sounds are normal. She exhibits no distension. There is no tenderness. There is no rebound and no guarding.  Vitals reviewed.         Assessment & Plan:  Hematuria - Plan: Urinalysis, Routine w reflex microscopic (not at [facility])  Benign essential HTN  Diabetes mellitus, insulin dependent (IDDM), controlled ([place]) - Plan: CBC with Differential/Platelet, COMPLETE METABOLIC PANEL WITH GFR, Hemoglobin  A1c, TSH  Hospital discharge follow-up - Plan: CBC with Differential/Platelet, COMPLETE METABOLIC PANEL WITH GFR, Hemoglobin A1c, TSH  Patient has moderate to severe dementia. I had a long and frank discussion with the patient's daughter about a urology consultation for cystoscopy. We discussed whether this would add quality to the patient's wife. At times her delirium is severe. At the present time the daughter is not interested in a urology consultation for cystoscopy to look for bladder cancer unless the bleeding intensifies. We're worried that this could decrease the quality of life through treatment if bladder cancer was found. I will recheck a CBC, CMP, hemoglobin A1c, and TSH for the patient's ear. If her hemoglobin continues to drop, we may need to follow with urology due to blood loss. However I believe this is unlikely given the quantity of blood the daughter describes in the throat on occasion. I have also recommended discontinuation of hydrochlorothiazide as the patient has recently been dehydrated and her blood pressure is borderline low. The hydrochlorothiazide does not appear necessary.  Add a fiber supplement as a stool bulking agent and also a probiotic to see if we can stop some of the fecal incontinence. If overt diarrhea develops, check the patient for C. Difficile.

## 2016-05-22 NOTE — Addendum Note (Signed)
Addended by: Haskel Khan A on: 05/22/2016 04:56 PM   Modules accepted: Orders

## 2016-05-23 DIAGNOSIS — N390 Urinary tract infection, site not specified: Secondary | ICD-10-CM | POA: Diagnosis not present

## 2016-05-23 DIAGNOSIS — B9620 Unspecified Escherichia coli [E. coli] as the cause of diseases classified elsewhere: Secondary | ICD-10-CM | POA: Diagnosis not present

## 2016-05-23 DIAGNOSIS — E1165 Type 2 diabetes mellitus with hyperglycemia: Secondary | ICD-10-CM | POA: Diagnosis not present

## 2016-05-23 DIAGNOSIS — I129 Hypertensive chronic kidney disease with stage 1 through stage 4 chronic kidney disease, or unspecified chronic kidney disease: Secondary | ICD-10-CM | POA: Diagnosis not present

## 2016-05-23 DIAGNOSIS — I2510 Atherosclerotic heart disease of native coronary artery without angina pectoris: Secondary | ICD-10-CM | POA: Diagnosis not present

## 2016-05-23 DIAGNOSIS — N183 Chronic kidney disease, stage 3 (moderate): Secondary | ICD-10-CM | POA: Diagnosis not present

## 2016-05-23 LAB — CBC WITH DIFFERENTIAL/PLATELET
BASOS PCT: 1 %
Basophils Absolute: 87 cells/uL (ref 0–200)
Eosinophils Absolute: 87 cells/uL (ref 15–500)
Eosinophils Relative: 1 %
HEMATOCRIT: 32.9 % — AB (ref 35.0–45.0)
HEMOGLOBIN: 10.7 g/dL — AB (ref 12.0–15.0)
LYMPHS ABS: 1044 {cells}/uL (ref 850–3900)
LYMPHS PCT: 12 %
MCH: 30.7 pg (ref 27.0–33.0)
MCHC: 32.5 g/dL (ref 32.0–36.0)
MCV: 94.5 fL (ref 80.0–100.0)
MONO ABS: 435 {cells}/uL (ref 200–950)
MPV: 9.2 fL (ref 7.5–12.5)
Monocytes Relative: 5 %
Neutro Abs: 7047 cells/uL (ref 1500–7800)
Neutrophils Relative %: 81 %
Platelets: 362 10*3/uL (ref 140–400)
RBC: 3.48 MIL/uL — AB (ref 3.80–5.10)
RDW: 14.5 % (ref 11.0–15.0)
WBC: 8.7 10*3/uL (ref 3.8–10.8)

## 2016-05-23 LAB — TSH: TSH: 1.47 mIU/L

## 2016-05-23 LAB — COMPLETE METABOLIC PANEL WITH GFR
ALT: 10 U/L (ref 6–29)
AST: 17 U/L (ref 10–35)
Albumin: 3.8 g/dL (ref 3.6–5.1)
Alkaline Phosphatase: 53 U/L (ref 33–130)
BUN: 30 mg/dL — AB (ref 7–25)
CALCIUM: 9.5 mg/dL (ref 8.6–10.4)
CHLORIDE: 98 mmol/L (ref 98–110)
CO2: 24 mmol/L (ref 20–31)
CREATININE: 1.14 mg/dL — AB (ref 0.60–0.88)
GFR, Est African American: 52 mL/min — ABNORMAL LOW (ref >=60)
GFR, Est Non African American: 45 mL/min — ABNORMAL LOW (ref >=60)
Glucose, Bld: 129 mg/dL — ABNORMAL HIGH (ref 70–99)
Potassium: 5.2 mmol/L (ref 3.5–5.3)
Sodium: 133 mmol/L — ABNORMAL LOW (ref 135–146)
Total Bilirubin: 0.5 mg/dL (ref 0.2–1.2)
Total Protein: 6.4 g/dL (ref 6.1–8.1)

## 2016-05-23 LAB — HEMOGLOBIN A1C
HEMOGLOBIN A1C: 7.4 % — AB (ref <5.7)
Mean Plasma Glucose: 166 mg/dL

## 2016-05-25 LAB — URINE CULTURE: Colony Count: >=100000

## 2016-05-26 ENCOUNTER — Other Ambulatory Visit: Payer: Self-pay | Admitting: Family Medicine

## 2016-05-26 ENCOUNTER — Telehealth: Payer: Self-pay | Admitting: Family Medicine

## 2016-05-26 DIAGNOSIS — G894 Chronic pain syndrome: Secondary | ICD-10-CM | POA: Diagnosis not present

## 2016-05-26 DIAGNOSIS — N390 Urinary tract infection, site not specified: Principal | ICD-10-CM

## 2016-05-26 DIAGNOSIS — B9620 Unspecified Escherichia coli [E. coli] as the cause of diseases classified elsewhere: Secondary | ICD-10-CM

## 2016-05-26 DIAGNOSIS — M1288 Other specific arthropathies, not elsewhere classified, other specified site: Secondary | ICD-10-CM | POA: Diagnosis not present

## 2016-05-26 DIAGNOSIS — M5137 Other intervertebral disc degeneration, lumbosacral region: Secondary | ICD-10-CM | POA: Diagnosis not present

## 2016-05-26 DIAGNOSIS — M4696 Unspecified inflammatory spondylopathy, lumbar region: Secondary | ICD-10-CM | POA: Diagnosis not present

## 2016-05-26 MED ORDER — SULFAMETHOXAZOLE-TRIMETHOPRIM 800-160 MG PO TABS: 1.0000 | ORAL_TABLET | Freq: Two times a day (BID) | ORAL | Status: DC

## 2016-05-26 MED ORDER — INSULIN NPH (HUMAN) (ISOPHANE) 100 UNIT/ML ~~LOC~~ SUSP: 18.0000 [IU] | SUBCUTANEOUS | Status: DC

## 2016-05-26 MED ORDER — INSULIN LISPRO 100 UNIT/ML ~~LOC~~ SOLN: 0.0000 [IU] | Freq: Three times a day (TID) | SUBCUTANEOUS | Status: DC | PRN

## 2016-05-26 NOTE — Telephone Encounter (Signed)
Daughter called about lab results.  Discussed blood and urine reports with her.  Bactrim to pharmacy.  Asked for refill of Humalog, it was sent as well.

## 2016-05-26 NOTE — Telephone Encounter (Signed)
Medication called/sent to requested pharmacy  

## 2016-05-26 NOTE — Telephone Encounter (Signed)
-----   Message from Susy Frizzle, MD sent at 05/26/2016  6:48 AM EDT ----- Urine culture has returned positive for peristent infection.  Start Bactrim ds 1 poid for 7 days and repeat UA and urine cx after she finishes antibiotics.

## 2016-05-26 NOTE — Telephone Encounter (Signed)
Pt's daughter is calling to request a vial of Humalog be called in to the [facility]

## 2016-05-26 NOTE — Telephone Encounter (Signed)
-----   Message from Susy Frizzle, MD sent at 05/23/2016  8:13 AM EDT ----- Diabetes test is acceptable.  Kidney fcn is good.  Hgb is stable.

## 2016-05-28 DIAGNOSIS — M159 Polyosteoarthritis, unspecified: Secondary | ICD-10-CM | POA: Diagnosis not present

## 2016-05-28 DIAGNOSIS — M353 Polymyalgia rheumatica: Secondary | ICD-10-CM | POA: Diagnosis not present

## 2016-05-28 DIAGNOSIS — Z79891 Long term (current) use of opiate analgesic: Secondary | ICD-10-CM | POA: Diagnosis not present

## 2016-05-28 DIAGNOSIS — F0390 Unspecified dementia without behavioral disturbance: Secondary | ICD-10-CM | POA: Diagnosis not present

## 2016-05-30 DIAGNOSIS — I2510 Atherosclerotic heart disease of native coronary artery without angina pectoris: Secondary | ICD-10-CM | POA: Diagnosis not present

## 2016-05-30 DIAGNOSIS — B9620 Unspecified Escherichia coli [E. coli] as the cause of diseases classified elsewhere: Secondary | ICD-10-CM | POA: Diagnosis not present

## 2016-05-30 DIAGNOSIS — N390 Urinary tract infection, site not specified: Secondary | ICD-10-CM | POA: Diagnosis not present

## 2016-05-30 DIAGNOSIS — E1165 Type 2 diabetes mellitus with hyperglycemia: Secondary | ICD-10-CM | POA: Diagnosis not present

## 2016-05-30 DIAGNOSIS — I129 Hypertensive chronic kidney disease with stage 1 through stage 4 chronic kidney disease, or unspecified chronic kidney disease: Secondary | ICD-10-CM | POA: Diagnosis not present

## 2016-05-30 DIAGNOSIS — N183 Chronic kidney disease, stage 3 (moderate): Secondary | ICD-10-CM | POA: Diagnosis not present

## 2016-06-04 DIAGNOSIS — B9620 Unspecified Escherichia coli [E. coli] as the cause of diseases classified elsewhere: Secondary | ICD-10-CM | POA: Diagnosis not present

## 2016-06-04 DIAGNOSIS — E1165 Type 2 diabetes mellitus with hyperglycemia: Secondary | ICD-10-CM | POA: Diagnosis not present

## 2016-06-04 DIAGNOSIS — N390 Urinary tract infection, site not specified: Secondary | ICD-10-CM | POA: Diagnosis not present

## 2016-06-04 DIAGNOSIS — I2510 Atherosclerotic heart disease of native coronary artery without angina pectoris: Secondary | ICD-10-CM | POA: Diagnosis not present

## 2016-06-04 DIAGNOSIS — I129 Hypertensive chronic kidney disease with stage 1 through stage 4 chronic kidney disease, or unspecified chronic kidney disease: Secondary | ICD-10-CM | POA: Diagnosis not present

## 2016-06-04 DIAGNOSIS — N183 Chronic kidney disease, stage 3 (moderate): Secondary | ICD-10-CM | POA: Diagnosis not present

## 2016-06-06 ENCOUNTER — Other Ambulatory Visit: Payer: Medicare Other

## 2016-06-06 DIAGNOSIS — N390 Urinary tract infection, site not specified: Secondary | ICD-10-CM | POA: Diagnosis not present

## 2016-06-06 DIAGNOSIS — B9620 Unspecified Escherichia coli [E. coli] as the cause of diseases classified elsewhere: Secondary | ICD-10-CM | POA: Diagnosis not present

## 2016-06-08 LAB — URINE CULTURE

## 2016-06-10 ENCOUNTER — Encounter: Payer: Self-pay | Admitting: Nurse Practitioner

## 2016-06-10 ENCOUNTER — Ambulatory Visit (INDEPENDENT_AMBULATORY_CARE_PROVIDER_SITE_OTHER): Payer: Medicare Other | Admitting: Nurse Practitioner

## 2016-06-10 VITALS — BP 139/71 | HR 74 | Ht 64.0 in | Wt 123.2 lb

## 2016-06-10 DIAGNOSIS — F0280 Dementia in other diseases classified elsewhere without behavioral disturbance: Secondary | ICD-10-CM | POA: Diagnosis not present

## 2016-06-10 DIAGNOSIS — R413 Other amnesia: Secondary | ICD-10-CM | POA: Diagnosis not present

## 2016-06-10 DIAGNOSIS — G309 Alzheimer's disease, unspecified: Secondary | ICD-10-CM | POA: Diagnosis not present

## 2016-06-10 NOTE — Patient Instructions (Addendum)
Continue Namenda at current dose Continue Seroquel at current dose Needs a letter discussing her mental status Follow-up in 6 months next visit with Dr. Krista Blue

## 2016-06-10 NOTE — Progress Notes (Addendum)
[facility]  PATIENT: Michelle [name] DOB: 1932-01-29   REASON FOR VISIT: Follow-up for memory loss Alzheimer's dementia   HISTORY FROM: Patient and daughters  HISTORY OF PRESENT ILLNESS:Patient returns for followup for her mild memory trouble. She has past medical history of diabetes, insulin-dependent, hypertension, hyperlipidemia, polymyalgia rheumatica, she also had a history of right knee replacement February 20, 2010, lumbar, cervical decompression surgery in the past.  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 MRI scan of the brain showed moderate degree of global cerebral atrophy and mild degree of changes of chronic microvascular ischemia.   Her maternal aunts suffered dementia,  her mother died of breast cancer at age 63, her father died of coronary artery disease at age 36.  Her paternal uncle, aunt suffered dementia,  She also reported a history of bradycardia, is followed by cardiologist, there was a period of time, pacemaker placement was considered, she is tolerating Aricept 5 mg, today's heart rate is 56s, after discussed with patient, we stopped Aricept, [place] was started since July 2014,   UPDATE Jan 15th 2015;She took Namenda for about 6 months, did not notice any significant difference, She has run out of namenda because of donut hole, she is less active because of bilateral hip and  knee pain. She is using Fentanyl patch 36mcg q week, instead of q3 days, it made her too sleepy. UPDATE March 24th 2016;She lives alone, drives herself to clinic today, Mini-Mental Status Examination is 68 out of 30, she is not oriented to date, has difficulty spell world backwards She is no longer taking Namenda because the high co-pay in the past, she complains worsening low back pain, had a previous lumbar, cervical decompression surgery by Dr. Saintclair Halsted, 3 epidural injection in 2014-02-20, most recent one was May 2015, failed to improve her symptoms, she is now using fentanyl patch 75 g, mild urinary incontinence, wearing pads, using a cane,  UPDATE Nov 2016:She is accompanied by her 2 daughters at today's clinical visit, she fell in August 20 fifth 2016, broke her left knee, is in left leg splint, daughter also reported gradual decline of functional status, memory trouble, agitations, she sometimes got confused "want to go home", while she lived in the same house since 28s. We have reviewed MRI of the brain September 2016, evidence of moderate generalized atrophy, especially at  perisylvarian fissure, mild small vessel disease. Her family is helping managing her medications now, she still does insulin shots herself, but the dosage was measured by her daughter, she is also taking fentanyl patch for her left knee pain, chronic low back pain. UPDATE Dec 12 2015:Her mind is not function not as good as it used to be, she has trouble with memory,  She is in good mood, sometimes more confused about her surrounding, she eats well, sleeps well.  She is now taking melatonin, she lives by herself,  She is not qualified for CREED study.  She is  now taking seroquel 25mg  1/2 to one tab po qhs. Still has evening time agitations, Today she was noted to have elevated blood pressure on repeat testing was 220 over 90, she is taking Micardis 40 mg daily    UPDATE 08/01/2017CM Michelle [name], 82 year old female returns for  follow-up with her daughters. She has been previously followed in this office by Dr. Krista Blue. She has a history of Alzheimer's dementia which has continued to progress. One of her daughters has been living with her now for several months. She is requiring more assistance with all ADLs. She is unable to manage her finances. She is unable to manage her medications. She is currently taking Namenda and Seroquel. She had bradycardia with Aricept. Blood pressure is well controlled in the office. Daughter reports that blood sugars are well controlled as well. She has had 2 ER admissions since last seen  for urinary tract infections. Daughter is needing assistance for in-home care as she has medical problems of her own which she has neglected. Their  Oneta Rack has suggested a letter written to discuss the fact that mom is no longer able to make decisions for herself. They need to sell some land  to be able to pay for caregivers. She returns for reevaluation REVIEW OF SYSTEMS: Full 14 system review of systems performed and notable only for those listed, all others are neg:  Constitutional: Appetite change  Cardiovascular: neg Ear/Nose/Throat: neg  Skin: neg Eyes: neg Respiratory: neg Gastroitestinal: Urinary incontinence  Hematology/Lymphatic: neg  Endocrine: neg Musculoskeletal: Walking difficulty Allergy/Immunology: neg Neurological: Memory loss Psychiatric: Agitation and confusion Sleep : neg   ALLERGIES: No Known Allergies  HOME MEDICATIONS: Outpatient Medications Prior to Visit  Medication Sig Dispense Refill  . amLODipine (NORVASC) 10 MG tablet Take 1 tablet (10 mg total) by mouth daily. 90 tablet 3  . aspirin 81 MG tablet Take 81 mg by mouth daily.    . Calcium Carbonate-Vit D-Min (CALCIUM 1200 PO) Take 1 tablet by mouth daily.     . Cholecalciferol (VITAMIN D3) 2000 units TABS Take by mouth.    . feeding supplement, ENSURE ENLIVE, (ENSURE ENLIVE) LIQD Take 237 mLs by mouth 2 (two) times daily  between meals. (Patient taking differently: Take 237 mLs by mouth daily. ) 237 mL 12  . ferrous sulfate 325 (65 FE) MG tablet Take 325 mg by mouth daily with breakfast.    . HYDROcodone-acetaminophen (NORCO/VICODIN) 5-325 MG per tablet Take 1 tablet by mouth as needed.     . insulin lispro (HUMALOG) 100 UNIT/ML injection Inject 0-0.06 mLs (0-6 Units total) into the skin 3 (three) times daily as needed for high blood sugar. Patient uses sliding scale 10 mL 5  . insulin NPH Human (HUMULIN N,NOVOLIN N) 100 UNIT/ML injection Inject 0.18 mLs (18 Units total) into the skin every morning. 10 mL 2  . levothyroxine (SYNTHROID, LEVOTHROID) 100 MCG tablet TAKE 1 TABLET (100 MCG TOTAL) BY MOUTH DAILY. 90 tablet 1  . memantine (NAMENDA) 10 MG tablet TAKE ONE TABLET BY MOUTH TWICE DAILY 180 tablet 4  . Multiple Vitamins-Minerals (OCUVITE PRESERVISION PO) Take 1 capsule by mouth daily.    . ONE TOUCH ULTRA TEST test strip TEST BS 5-6 TIMES A DAY 200 each 6  . predniSONE (DELTASONE) 5 MG tablet Take 1 tablet by mouth daily.  3  . QUEtiapine (SEROQUEL) 25 MG tablet Take 2 tablets (50 mg total) by mouth at bedtime. 60 tablet 11  . telmisartan (MICARDIS) 40 MG tablet Take  1 tablet (40 mg total) by mouth daily. 90 tablet 3  . tiotropium (SPIRIVA HANDIHALER) 18 MCG inhalation capsule Place 1 capsule (18 mcg total) into inhaler and inhale daily. 90 capsule 3  . fentaNYL (DURAGESIC - DOSED MCG/HR) 50 MCG/HR Place 50 mcg onto the skin every 3 (three) days.    Marland Kitchen sulfamethoxazole-trimethoprim (BACTRIM DS,SEPTRA DS) 800-160 MG tablet Take 1 tablet by mouth 2 (two) times daily. 14 tablet 0  . hydrochlorothiazide (HYDRODIURIL) 25 MG tablet Take 1 tablet by mouth daily.  2   No facility-administered medications prior to visit.     PAST MEDICAL HISTORY: Past Medical History:  Diagnosis Date  . Arthritis   . Asthma   . COPD (chronic obstructive pulmonary disease) ([place])   . Coronary atherosclerosis   . Dyslipidemia   .  Gallstones   . History of fractured kneecap   . Hypothyroidism   . Interstitial cystitis   . Malaise and fatigue   . Memory loss   . Polymyalgia rheumatica ([place])   . Systemic hypertension   . Type II or unspecified type diabetes mellitus without mention of complication, not stated as uncontrolled   . UTI (lower urinary tract infection)     PAST SURGICAL HISTORY: Past Surgical History:  Procedure Laterality Date  . CARDIAC CATHETERIZATION  01/29/2006   10% luminal irregularity mid LAD  . CARDIAC CATHETERIZATION  03/15/2001   No significant CAD, no evidence of renal artery stenosis, systemic hypertenion  . CERVICAL SPINE SURGERY    . CHOLECYSTECTOMY    . HERNIA REPAIR    . LUMBAR DISC SURGERY    . NM MYOCAR PERF WALL MOTION  08/17/2009   normal  . ROTATOR CUFF REPAIR    . TONSILLECTOMY    . TOTAL ABDOMINAL HYSTERECTOMY    . US ECHOCARDIOGRAPHY  09/14/2007   mild LVH, LA mildly dilated,mild mitral annular calcification, AOV mildly sclerotic, aortic root sclerosis/ca+    FAMILY HISTORY: Family History  Problem Relation Age of Onset  . Breast cancer Mother   . Heart disease Father   . Lung cancer Brother     SOCIAL HISTORY: Social History   Social History  . Marital status: Widowed    Spouse name: N/A  . Number of children: 4  . Years of education: 12   Occupational History  . retired Retired   Social History Main Topics  . Smoking status: Never Smoker  . Smokeless tobacco: Never Used  . Alcohol use No  . Drug use: No  . Sexual activity: Not on file   Other Topics Concern  . Not on file   Social History Narrative   Patient lives at home alone. Widowed.   Retired.   Education High school   Right handed   Caffeine- some times coffee and soda one cup.     PHYSICAL EXAM  Vitals:   06/10/16 1605  BP: 139/71  Pulse: 74  Weight: 123 lb 3.2 oz (55.9 kg)  Height: 5\' 4"  (1.626 m)   Body mass index is 21.15 kg/m. Gen: NAD, conversant, well nourised,  well  groomed                     Cardiovascular: Regular rate rhythm, Neck: Supple, no carotid bruit. NEUROLOGICAL EXAM:  MENTAL STATUS: Speech:    Speech is normal; fluent and spontaneous with normal comprehension.  Cognition: Mini-Mental Status Examination is 19 out of 30, animal naming is 5. Unable to draw a clock.  Orientation: She is not oriented to date, year, month, day, season     Recent and remote memory: She missed one 3 of 3 recalls      Missed items in  Attention span and concentration     Normal Language, naming, repeating,spontaneous speech        CRANIAL NERVES: CN II: Visual fields are full to confrontation.  Pupils are  reactive to light.  CN III, IV, VI: extraocular movement are normal. No ptosis. CN V: Facial sensation is intact to pinprick in all 3 divisions bilaterally.  CN VII: Face is symmetric with normal eye closure and smile. CN VIII: Hearing is normal to rubbing fingers CN IX, X: Palate elevates symmetrically. Phonation is normal. CN XI: Head turning and shoulder shrug are intact CN XII: Tongue is midline with normal movements and no atrophy.  MOTOR:There is no pronator drift of out-stretched arms. Muscle bulk and tone are normal. Muscle strength is normal, in the upper and lower extremities REFLEXES:Present and symmetric. SENSORY:Light touch, pinprick, position sense, and vibration sense are intact in fingers and toes. COORDINATION:Rapid alternating movements and fine finger movements are intact. There is no dysmetria on finger-to-nose GAIT/STANCE:Need assistant to get up from seated position, antalgic, difficulty initiate gait. Ambulates with a walker  DIAGNOSTIC DATA (LABS, IMAGING, TESTING) - I reviewed patient records, labs, notes, testing and imaging myself where available.  Lab Results  Component Value Date   WBC 8.7 05/22/2016   HGB 10.7 (L) 05/22/2016   HCT 32.9 (L) 05/22/2016   MCV 94.5 05/22/2016   PLT 362 05/22/2016        Component Value Date/Time   NA 133 (L) 05/22/2016 1637   K 5.2 05/22/2016 1637   CL 98 05/22/2016 1637   CO2 24 05/22/2016 1637   GLUCOSE 129 (H) 05/22/2016 1637   BUN 30 (H) 05/22/2016 1637   CREATININE 1.14 (H) 05/22/2016 1637   CALCIUM 9.5 05/22/2016 1637   PROT 6.4 05/22/2016 1637   ALBUMIN 3.8 05/22/2016 1637   AST 17 05/22/2016 1637   ALT 10 05/22/2016 1637   ALKPHOS 53 05/22/2016 1637   BILITOT 0.5 05/22/2016 1637   GFRNONAA 45 (L) 05/22/2016 1637   GFRAA 52 (L) 05/22/2016 1637   Lab Results  Component Value Date   CHOL 189 06/13/2015   HDL 62 06/13/2015   LDLCALC 119 (H) 06/13/2015   TRIG 39 06/13/2015   CHOLHDL 3.0 06/13/2015   Lab Results  Component Value Date   HGBA1C 7.4 (H) 05/22/2016   Lab Results  Component Value Date   VITAMINB12 1,976 (H) 07/03/2015   Lab Results  Component Value Date   TSH 1.47 05/22/2016      ASSESSMENT AND PLAN  82 y.o. year old female  has a past medical history of Arthritis; Asthma; COPD (chronic obstructive pulmonary disease) ([place]); Coronary atherosclerosis; Dyslipidemia; Memory loss; Polymyalgia rheumatica ([place]); Systemic hypertension; Type II or unspecified type diabetes mellitus without mention of complication, not stated as uncontrolled; and UTI (lower urinary tract infection). here To follow-up for her memory loss. Mini-Mental status exam 19/30. She will continue Namenda 10 mg twice daily. She was unable to tolerate Aricept in the past due to bradycardia.The patient is a current patient of Dr. Krista Blue who is out of the office today . This note is sent to the work in doctor.    Marland Kitchen   PLAN: Continue Namenda at current dose Continue Seroquel at current dose Needs a letter discussing her mental status and the  fact that she cannot manage her affairs make financial decisions Follow-up in 6 months next visit with Dr. Krista Blue per family request Dennie Bible, [facility], [facility], APRN  [facility] [address], [place], [place] [address] [phone]  Personally  participated in and made any corrections needed to history, physical, neuro exam,assessment and plan as stated above.   Sarina Ill, MD [facility]

## 2016-06-11 ENCOUNTER — Telehealth: Payer: Self-pay | Admitting: Nurse Practitioner

## 2016-06-11 DIAGNOSIS — Z0279 Encounter for issue of other medical certificate: Secondary | ICD-10-CM

## 2016-06-11 NOTE — Telephone Encounter (Signed)
Patient's daughter Juliann Pulse is calling to give the name and address of the lawyer for the letter regarding the patient. The lawyer is Darlina Sicilian, [address] [place], [place]. Please fax the letter to [phone] and the daughter would like a copy if a call can be placed to [facility] when it is ready.

## 2016-06-12 ENCOUNTER — Telehealth: Payer: Self-pay | Admitting: Family Medicine

## 2016-06-12 NOTE — Telephone Encounter (Signed)
Pt calling about urine culture results.  Please call her.

## 2016-06-12 NOTE — Telephone Encounter (Signed)
Letter faxed to [phone] per daughter request.  I called and LM for her on her # that faxed letter and (did she want copy or mailed to her)?

## 2016-06-12 NOTE — Telephone Encounter (Signed)
Had called several times but did reach [facility] today and she is aware of results

## 2016-06-13 ENCOUNTER — Telehealth: Payer: Self-pay | Admitting: Family Medicine

## 2016-06-13 DIAGNOSIS — B9620 Unspecified Escherichia coli [E. coli] as the cause of diseases classified elsewhere: Secondary | ICD-10-CM | POA: Diagnosis not present

## 2016-06-13 DIAGNOSIS — I129 Hypertensive chronic kidney disease with stage 1 through stage 4 chronic kidney disease, or unspecified chronic kidney disease: Secondary | ICD-10-CM | POA: Diagnosis not present

## 2016-06-13 DIAGNOSIS — E1165 Type 2 diabetes mellitus with hyperglycemia: Secondary | ICD-10-CM | POA: Diagnosis not present

## 2016-06-13 DIAGNOSIS — N183 Chronic kidney disease, stage 3 (moderate): Secondary | ICD-10-CM | POA: Diagnosis not present

## 2016-06-13 DIAGNOSIS — I2510 Atherosclerotic heart disease of native coronary artery without angina pectoris: Secondary | ICD-10-CM | POA: Diagnosis not present

## 2016-06-13 DIAGNOSIS — N390 Urinary tract infection, site not specified: Secondary | ICD-10-CM | POA: Diagnosis not present

## 2016-06-13 NOTE — Telephone Encounter (Signed)
Pt's daughter aware via vm 

## 2016-06-13 NOTE — Telephone Encounter (Signed)
Yes, please resume HCTZ.

## 2016-06-13 NOTE — Telephone Encounter (Signed)
Michelle [name] called and states that we had stopped her HCTZ and now her BP has gone back up the past few days - 180's - 170's on top and this am it was 196/96. She was wondering if she should restart it or not.

## 2016-06-18 DIAGNOSIS — B9620 Unspecified Escherichia coli [E. coli] as the cause of diseases classified elsewhere: Secondary | ICD-10-CM | POA: Diagnosis not present

## 2016-06-18 DIAGNOSIS — N390 Urinary tract infection, site not specified: Secondary | ICD-10-CM | POA: Diagnosis not present

## 2016-06-18 DIAGNOSIS — E1165 Type 2 diabetes mellitus with hyperglycemia: Secondary | ICD-10-CM | POA: Diagnosis not present

## 2016-06-18 DIAGNOSIS — I129 Hypertensive chronic kidney disease with stage 1 through stage 4 chronic kidney disease, or unspecified chronic kidney disease: Secondary | ICD-10-CM | POA: Diagnosis not present

## 2016-06-18 DIAGNOSIS — N183 Chronic kidney disease, stage 3 (moderate): Secondary | ICD-10-CM | POA: Diagnosis not present

## 2016-06-18 DIAGNOSIS — I2510 Atherosclerotic heart disease of native coronary artery without angina pectoris: Secondary | ICD-10-CM | POA: Diagnosis not present

## 2016-06-19 NOTE — Telephone Encounter (Signed)
Mailed copy to pt's address.

## 2016-06-26 DIAGNOSIS — M47817 Spondylosis without myelopathy or radiculopathy, lumbosacral region: Secondary | ICD-10-CM | POA: Diagnosis not present

## 2016-06-27 ENCOUNTER — Other Ambulatory Visit: Payer: Medicare Other

## 2016-06-27 ENCOUNTER — Encounter: Payer: Self-pay | Admitting: Gastroenterology

## 2016-06-27 ENCOUNTER — Ambulatory Visit (INDEPENDENT_AMBULATORY_CARE_PROVIDER_SITE_OTHER): Payer: Medicare Other | Admitting: Gastroenterology

## 2016-06-27 ENCOUNTER — Telehealth: Payer: Self-pay | Admitting: Internal Medicine

## 2016-06-27 VITALS — BP 128/64 | HR 70 | Ht 62.0 in | Wt 130.6 lb

## 2016-06-27 DIAGNOSIS — R159 Full incontinence of feces: Secondary | ICD-10-CM | POA: Diagnosis not present

## 2016-06-27 DIAGNOSIS — R197 Diarrhea, unspecified: Secondary | ICD-10-CM

## 2016-06-27 NOTE — Progress Notes (Signed)
     06/27/2016 Michelle [name] [id number] Jan 27, 1932   History of Present Illness:  This is an 82 year old female who is previously known to Dr. Henrene Pastor for complaints of loose stools with fecal incontinence. She has had evaluation in the past including colonoscopy in May 2014 at which time the study was normal along with random colon biopsies. She was managed with protective undergarments and use of Imodium prn.  Patient is unable to provide history due to her Alzheimer's dementia, but she is here with her daughter today he states that since yesterday morning her mother has been having loose/soft stools that have just been continuously coming out from her bottom. It is not watery or what she would describe as "diarrhea", but the issue is that it just continues to come out. She is taking a bulking agent in the form of fiber supplement which had been recommended previously. She is also taking probiotic.  They use Imodium cautiously because then she can become constipated and they have to somewhat disimpact her.  Unsure how accurate a historian the patient is, but she denies any abdominal pain. There's been no report of rectal bleeding.  The daughter says that prior to yesterday morning her mother's stools have always been very soft and unformed, but they have not been continuously incontinent like this.   Current Medications, Allergies, Past Medical History, Past Surgical History, Family History and Social History were reviewed in [employer] record.   Physical Exam: BP 128/64   Pulse 70   Ht 5\' 2"  (1.575 m)   Wt 130 lb 9.6 oz (59.2 kg)   BMI 23.89 kg/m  General: Well developed white female in no acute distress Head: Normocephalic and atraumatic Eyes:  Sclerae anicteric, conjunctiva pink  Ears: Normal auditory acuity Lungs: Clear throughout to auscultation Heart: Regular rate and rhythm Abdomen: Soft, non-distended.  Normal bowel sounds.  Non-tender. Musculoskeletal:  Symmetrical with no gross deformities  Extremities: No edema  Neurological: Alert oriented x 4, grossly non-focal Psychological:  Alert and cooperative. Normal mood and affect  Assessment and Recommendations: -Diarrhea/soft stools with incontinence:  This has been an issue in the past with negative evaluation and had improved but was just managed with protective undergarments and Imodium prn.  Started again about 36 hours ago.  Will check stool GI pathogen panel with particular concern for possible Cdiff since she has bean on antibiotics recently for UTI, etc.  Can continue to use Imodium in the interim.  Continue fiber supplement/bulking agent and probiotic.  Possibly medications playing into her chronic very soft/loose stools.

## 2016-06-27 NOTE — Telephone Encounter (Signed)
Pts daughter states pt had diarrhea yesterday and continued with diarrhea all through the night. Pt was not able to control her bowels and was incontinent. Request pt be seen. Pt scheduled to see Alonza Bogus PA today at 2pm. Daughter aware of appt.

## 2016-06-27 NOTE — Patient Instructions (Signed)
Please use OTC Imodium to treat your symptoms as needed.   Your physician has requested that you go to the basement for lab work before leaving today.

## 2016-06-30 LAB — GASTROINTESTINAL PATHOGEN PANEL PCR
C. difficile Tox A/B, PCR: NOT DETECTED
Campylobacter, PCR: NOT DETECTED
Cryptosporidium, PCR: NOT DETECTED
E COLI (STEC) STX1/STX2, PCR: NOT DETECTED
E coli (ETEC) LT/ST PCR: NOT DETECTED
E coli 0157, PCR: NOT DETECTED
Giardia lamblia, PCR: NOT DETECTED
NOROVIRUS, PCR: NOT DETECTED
ROTAVIRUS, PCR: NOT DETECTED
SALMONELLA, PCR: NOT DETECTED
SHIGELLA, PCR: NOT DETECTED

## 2016-06-30 NOTE — Progress Notes (Signed)
Agree with initial assessment and plans as outlined. Her dementia may be a significant factor toward her incontinence

## 2016-07-24 ENCOUNTER — Ambulatory Visit (INDEPENDENT_AMBULATORY_CARE_PROVIDER_SITE_OTHER): Payer: Medicare Other | Admitting: Family Medicine

## 2016-07-24 ENCOUNTER — Encounter: Payer: Self-pay | Admitting: Family Medicine

## 2016-07-24 VITALS — BP 112/64 | HR 72 | Temp 98.2°F | Resp 14 | Wt 126.0 lb

## 2016-07-24 DIAGNOSIS — R8290 Unspecified abnormal findings in urine: Secondary | ICD-10-CM | POA: Diagnosis not present

## 2016-07-24 DIAGNOSIS — Z23 Encounter for immunization: Secondary | ICD-10-CM

## 2016-07-24 LAB — URINALYSIS, ROUTINE W REFLEX MICROSCOPIC
BILIRUBIN URINE: NEGATIVE
GLUCOSE, UA: NEGATIVE
Ketones, ur: NEGATIVE
Nitrite: NEGATIVE
PH: 5.5 (ref 5.0–8.0)
Specific Gravity, Urine: <1.005 (ref 1.001–1.035)

## 2016-07-24 LAB — URINALYSIS, MICROSCOPIC ONLY
CASTS: NONE SEEN [LPF]
Crystals: NONE SEEN [HPF]
YEAST: NONE SEEN [HPF]

## 2016-07-24 MED ORDER — SULFAMETHOXAZOLE-TRIMETHOPRIM 800-160 MG PO TABS: 1.0000 | ORAL_TABLET | Freq: Two times a day (BID) | ORAL | 0 refills | Status: DC

## 2016-07-24 NOTE — Addendum Note (Signed)
Addended by: Shary Decamp B on: 07/24/2016 05:05 PM   Modules accepted: Orders

## 2016-07-24 NOTE — Progress Notes (Signed)
Subjective:    Patient ID: Michelle [name], female    DOB: 12/01/1931, 82 y.o.   MRN: [id number]  HPI Recently discharge dfrom hospital after E. Coli UTI:  Discharge Diagnoses:  Principal Problem:  E-coli UTI with? Acute pyelonephritis   Active Problems:  Hematuria, gross  Polymyalgia rheumatica (HCC)  Hypothyroidism  Acute renal failure superimposed on stage 3 chronic kidney disease (HCC)  Diabetes mellitus with complication (HCC)  Alzheimer's dementia  Pressure ulcer  Brief Narrative  82 year old with history of mild to moderate dementia, type 2 diabetes mellitus, CKD stage III (baseline creatinine of 1.4) and hypertension was brought to the ED by her daughter with dysuria and hematuria for the past 2 days. Patient also complained of left flank pain on the day of admission. Patient was discharged from the hospital about 6 weeks ago and since then was complaining of increased fatigue. Denied any nausea vomiting or fever. In the ED she was found to have acute on chronic kidney injury with gross hematuria and pyuria. Admitted to hospitalist service for further management.  Hospital course Principal Problem: UTI with? Acute pyelonephritis and gross hematuria Cultures growing Escherichia coli. Pansensitive. Renal ultrasound shows medical renal disease without hydronephrosis and small amount of layering debris within the bladder lumen. Was treated with empiric IV Rocephin. No further dysuria or hematuria. Will discharge on oral Keflex to complete 7 day course of antibiotic.    Active Problems: Gross hematuria Suspect due to UTI. No prior history. Daughter denies significant weight loss or smoking history. Ultrasound shows layering debris in the bladder lumen. Needs follow-up UA once treated for UTI. If symptoms persistent will need referral to urology.   Acute renal failure superimposed on stage 3 chronic kidney disease ([place]) Secondary to UTI and dehydration. Daughter  reports patient not drinking enough fluid. Improved with IV fluids. Held HCTZ and ARB. Now resolved.   Diabetes mellitus with nephropathy ([place]) cbg upto 300s, resume home dose NPH   Hypothyroidism Continue Synthroid.    Alzheimer's dementia Stable with mild to moderate dementia. Continue Namenda.  Generalized weakness Seen by PT and recommended home health. Seen by nutritionist and added supplement.  Hyperkalemia Resolved. ARB held In the hospital is on discharge.   Pressure ulcer  Fibromyalgia On chronic low-dose prednisone   Since discharge from the hospital, the patient's daughter reports continued occasional hematuria. It is only 1 or 2 drops in the toilet. Urinalysis today is negative. She is here to discuss follow-up with urology. She has not had a CT scan of the kidneys. She continues to suffer from dementia and her level of confusion waxes and wanes. She is overdue to check a hemoglobin A1c. She is also due to recheck her creatinine. Upon admission to the hospital, her creatinine was 2.8. At discharge it was 1.2. Her blood pressure today is low at 100/58. Her appetite waxes and wanes. At times she does not drink enough fluid. Daughter also reports soft stools that are poorly formed and lead to fecal incontinence but no overt diarrhea. This predates her hospital admission. The predates antibiotics.  At that time, my plan was: Patient has moderate to severe dementia. I had a long and frank discussion with the patient's daughter about a urology consultation for cystoscopy. We discussed whether this would add quality to the patient's wife. At times her delirium is severe. At the present time the daughter is not interested in a urology consultation for cystoscopy to look for bladder cancer unless the bleeding intensifies.  We're worried that this could decrease the quality of life through treatment if bladder cancer was found. I will recheck a CBC, CMP, hemoglobin A1c, and TSH for the  patient's ear. If her hemoglobin continues to drop, we may need to follow with urology due to blood loss. However I believe this is unlikely given the quantity of blood the daughter describes in the throat on occasion. I have also recommended discontinuation of hydrochlorothiazide as the patient has recently been dehydrated and her blood pressure is borderline low. The hydrochlorothiazide does not appear necessary.  Add a fiber supplement as a stool bulking agent and also a probiotic to see if we can stop some of the fecal incontinence. If overt diarrhea develops, check the patient for C. Difficile.  07/24/16 Has had foul-smelling urine for several days. Continues to have fecal incontinence. Daughter is concerned she may have a urinary tract infection. At the present time, daughter has no desire to proceed with a colonoscopy to determine why she has frequent watery diarrhea. She is seen gastroenterology in stool cultures were negative Past Medical History:  Diagnosis Date  . Arthritis   . Asthma   . COPD (chronic obstructive pulmonary disease) ([place])   . Coronary atherosclerosis   . Dyslipidemia   . Gallstones   . History of fractured kneecap   . Hypothyroidism   . Interstitial cystitis   . Malaise and fatigue   . Memory loss   . Polymyalgia rheumatica ([place])   . Systemic hypertension   . Type II or unspecified type diabetes mellitus without mention of complication, not stated as uncontrolled   . UTI (lower urinary tract infection)    Past Surgical History:  Procedure Laterality Date  . CARDIAC CATHETERIZATION  01/29/2006   10% luminal irregularity mid LAD  . CARDIAC CATHETERIZATION  03/15/2001   No significant CAD, no evidence of renal artery stenosis, systemic hypertenion  . CERVICAL SPINE SURGERY    . CHOLECYSTECTOMY    . HERNIA REPAIR    . LUMBAR DISC SURGERY    . NM MYOCAR PERF WALL MOTION  08/17/2009   normal  . ROTATOR CUFF REPAIR    . TONSILLECTOMY    . TOTAL ABDOMINAL  HYSTERECTOMY    . US ECHOCARDIOGRAPHY  09/14/2007   mild LVH, LA mildly dilated,mild mitral annular calcification, AOV mildly sclerotic, aortic root sclerosis/ca+   Current Outpatient Prescriptions on File Prior to Visit  Medication Sig Dispense Refill  . amLODipine (NORVASC) 10 MG tablet Take 1 tablet (10 mg total) by mouth daily. 90 tablet 3  . aspirin 81 MG tablet Take 81 mg by mouth daily.    . Calcium Carbonate-Vit D-Min (CALCIUM 1200 PO) Take 1 tablet by mouth daily.     . Cholecalciferol (VITAMIN D3) 2000 units TABS Take by mouth.    . feeding supplement, ENSURE ENLIVE, (ENSURE ENLIVE) LIQD Take 237 mLs by mouth 2 (two) times daily between meals. (Patient taking differently: Take 237 mLs by mouth daily. ) 237 mL 12  . fentaNYL (DURAGESIC - DOSED MCG/HR) 25 MCG/HR patch Place 25 mcg onto the skin every 3 (three) days.   0  . ferrous sulfate 325 (65 FE) MG tablet Take 325 mg by mouth daily with breakfast.    . hydrochlorothiazide (HYDRODIURIL) 25 MG tablet Take 25 mg by mouth daily.    Marland Kitchen HYDROcodone-acetaminophen (NORCO/VICODIN) 5-325 MG per tablet Take 1 tablet by mouth as needed.     . insulin lispro (HUMALOG) 100 UNIT/ML injection Inject 0-0.06  mLs (0-6 Units total) into the skin 3 (three) times daily as needed for high blood sugar. Patient uses sliding scale 10 mL 5  . insulin NPH Human (HUMULIN N,NOVOLIN N) 100 UNIT/ML injection Inject 0.18 mLs (18 Units total) into the skin every morning. 10 mL 2  . levothyroxine (SYNTHROID, LEVOTHROID) 100 MCG tablet TAKE 1 TABLET (100 MCG TOTAL) BY MOUTH DAILY. 90 tablet 1  . memantine (NAMENDA) 10 MG tablet TAKE ONE TABLET BY MOUTH TWICE DAILY 180 tablet 4  . Multiple Vitamins-Minerals (OCUVITE PRESERVISION PO) Take 1 capsule by mouth daily.    . ONE TOUCH ULTRA TEST test strip TEST BS 5-6 TIMES A DAY 200 each 6  . predniSONE (DELTASONE) 5 MG tablet Take 1 tablet by mouth daily.  3  . psyllium (REGULOID) 0.52 g capsule Take 0.52 g by mouth 2 (two)  times daily.    . QUEtiapine (SEROQUEL) 25 MG tablet Take 2 tablets (50 mg total) by mouth at bedtime. 60 tablet 11  . telmisartan (MICARDIS) 40 MG tablet Take 1 tablet (40 mg total) by mouth daily. 90 tablet 3  . tiotropium (SPIRIVA HANDIHALER) 18 MCG inhalation capsule Place 1 capsule (18 mcg total) into inhaler and inhale daily. 90 capsule 3  . UNABLE TO FIND Med Name: probiotic capsule   Takes one daily.     No current facility-administered medications on file prior to visit.    No Known Allergies Social History   Social History  . Marital status: Widowed    Spouse name: N/A  . Number of children: 4  . Years of education: 12   Occupational History  . retired Retired   Social History Main Topics  . Smoking status: Never Smoker  . Smokeless tobacco: Never Used  . Alcohol use No  . Drug use: No  . Sexual activity: Not on file   Other Topics Concern  . Not on file   Social History Narrative   Patient lives at home alone. Widowed.   Retired.   Education High school   Right handed   Caffeine- some times coffee and soda one cup.     Review of Systems  All other systems reviewed and are negative.      Objective:   Physical Exam  Cardiovascular: Normal rate, regular rhythm and normal heart sounds.   Pulmonary/Chest: Effort normal and breath sounds normal. No respiratory distress. She has no wheezes. She has no rales.  Abdominal: Soft. Bowel sounds are normal. She exhibits no distension. There is no tenderness. There is no rebound and no guarding.  Vitals reviewed.         Assessment & Plan:  Bad odor of urine - Plan: Urinalysis, Routine w reflex microscopic (not at [facility]), sulfamethoxazole-trimethoprim (BACTRIM DS,SEPTRA DS) 800-160 MG tablet  Urinalysis appears to show urinary tract infection. Begin Bactrim double strength tablets 1 by mouth twice a day for 7 days. Patient can take up to 30 g of psyllium a day to try to improve the diarrhea. Discussed a  colonoscopy but given her severe dementia, daughter decides against it. Would rather treat symptomatically.  Received her flu shot.

## 2016-07-25 ENCOUNTER — Ambulatory Visit: Payer: Medicare Other | Admitting: Family Medicine

## 2016-07-26 LAB — URINE CULTURE

## 2016-07-28 ENCOUNTER — Telehealth: Payer: Self-pay | Admitting: Family Medicine

## 2016-07-28 DIAGNOSIS — M5137 Other intervertebral disc degeneration, lumbosacral region: Secondary | ICD-10-CM | POA: Diagnosis not present

## 2016-07-28 DIAGNOSIS — Z79891 Long term (current) use of opiate analgesic: Secondary | ICD-10-CM | POA: Diagnosis not present

## 2016-07-28 DIAGNOSIS — G894 Chronic pain syndrome: Secondary | ICD-10-CM | POA: Diagnosis not present

## 2016-07-28 DIAGNOSIS — M4696 Unspecified inflammatory spondylopathy, lumbar region: Secondary | ICD-10-CM | POA: Diagnosis not present

## 2016-07-28 DIAGNOSIS — Z79899 Other long term (current) drug therapy: Secondary | ICD-10-CM | POA: Diagnosis not present

## 2016-07-28 DIAGNOSIS — M47817 Spondylosis without myelopathy or radiculopathy, lumbosacral region: Secondary | ICD-10-CM | POA: Diagnosis not present

## 2016-07-28 NOTE — Telephone Encounter (Signed)
Or [phone]

## 2016-07-28 NOTE — Telephone Encounter (Signed)
Juliann Pulse her daughter calling to talk to you about her pulse  [phone] Says it is urgent that she gets a call

## 2016-07-29 ENCOUNTER — Other Ambulatory Visit: Payer: Self-pay | Admitting: Family Medicine

## 2016-07-29 MED ORDER — "INSULIN SYRINGE-NEEDLE U-100 31G X 5/16"" 1 ML MISC": 5 refills | Status: AC

## 2016-07-29 NOTE — Telephone Encounter (Signed)
Called and spoke to Michelle [name] and she states that pt's BP is great but her Pulse is in the 50's and pt is not wanting to do anything just sits and sleeps and was wondering if this was d/t a medication?  Also wants to know if she would benefit from being on an antibx everyday to prevent UTI's? (one of her other MD's suggested this).

## 2016-07-29 NOTE — Telephone Encounter (Signed)
I am ok with her pulse being in the 50's.  Taking an abx everyday to prevent UTI can be dangerous as it leads to abx resistance and more aggressive infections.  I would only recommend it if they are frequent 4-5 in the last 12 months.  If so, we could start keflex 500 qday as a preventative.

## 2016-07-30 NOTE — Telephone Encounter (Signed)
Called and spoke to daughter last PM and she wanted to know what about her pulse dropping in the 40's? I informed pt that she did not tell me about the 40's just that it was in the 50's. She states that it has only dropped to 49. She has stopped giving her the HCTZ  - she wanted me to ask if it was ok to stop that since her BP was good and her pulse was so low???? I informed her that it would be Thursday before she heard back from me she verbalized understanding.

## 2016-07-31 NOTE — Telephone Encounter (Signed)
HCTZ is NOT affecting her pulse, only her blood pressure.  None of her meds slow her pulse significantly.  If her pulse is significantly low (40's consistently and symptomatic) they would have to place a pacemaker as it would be due to her heart's SA node malfunctioning due to age.  I do not think she requires that at the present time unless she is having events that I am not aware of.  Please resume HCTZ  unless her BP is less than 110/60.

## 2016-08-01 NOTE — Telephone Encounter (Signed)
Patient's daughter aware of providers recommendations.

## 2016-08-14 ENCOUNTER — Encounter: Payer: Self-pay | Admitting: Family Medicine

## 2016-08-14 ENCOUNTER — Ambulatory Visit (INDEPENDENT_AMBULATORY_CARE_PROVIDER_SITE_OTHER): Payer: Medicare Other | Admitting: Family Medicine

## 2016-08-14 VITALS — BP 130/70 | HR 78 | Temp 99.0°F | Resp 14 | Wt 125.0 lb

## 2016-08-14 DIAGNOSIS — R8290 Unspecified abnormal findings in urine: Secondary | ICD-10-CM | POA: Diagnosis not present

## 2016-08-14 DIAGNOSIS — Z23 Encounter for immunization: Secondary | ICD-10-CM

## 2016-08-14 LAB — URINALYSIS, MICROSCOPIC ONLY
CRYSTALS: NONE SEEN [HPF]
Casts: NONE SEEN [LPF]
Yeast: NONE SEEN [HPF]

## 2016-08-14 LAB — URINALYSIS, ROUTINE W REFLEX MICROSCOPIC
BILIRUBIN URINE: NEGATIVE
Glucose, UA: NEGATIVE
Ketones, ur: NEGATIVE
Nitrite: NEGATIVE
SPECIFIC GRAVITY, URINE: 1.01 (ref 1.001–1.035)
pH: 5.5 (ref 5.0–8.0)

## 2016-08-14 MED ORDER — SULFAMETHOXAZOLE-TRIMETHOPRIM 400-80 MG PO TABS: 1.0000 | ORAL_TABLET | Freq: Two times a day (BID) | ORAL | 0 refills | Status: DC

## 2016-08-14 NOTE — Addendum Note (Signed)
Addended by: Shary Decamp B on: 08/14/2016 03:47 PM   Modules accepted: Orders

## 2016-08-14 NOTE — Addendum Note (Signed)
Addended by: Shary Decamp B on: 08/14/2016 03:10 PM   Modules accepted: Orders

## 2016-08-14 NOTE — Progress Notes (Signed)
Subjective:    Patient ID: Michelle [name], female    DOB: 28-Apr-1932, 82 y.o.   MRN: [id number]  HPI 05/2016 Since discharge from the hospital for uti, the patient's daughter reports continued occasional hematuria. It is only 1 or 2 drops in the toilet. Urinalysis today is negative. She is here to discuss follow-up with urology. She has not had a CT scan of the kidneys. She continues to suffer from dementia and her level of confusion waxes and wanes. She is overdue to check a hemoglobin A1c. She is also due to recheck her creatinine. Upon admission to the hospital, her creatinine was 2.8. At discharge it was 1.2. Her blood pressure today is low at 100/58. Her appetite waxes and wanes. At times she does not drink enough fluid. Daughter also reports soft stools that are poorly formed and lead to fecal incontinence but no overt diarrhea. This predates her hospital admission. The predates antibiotics.  At that time, my plan was: Patient has moderate to severe dementia. I had a long and frank discussion with the patient's daughter about a urology consultation for cystoscopy. We discussed whether this would add quality to the patient's wife. At times her delirium is severe. At the present time the daughter is not interested in a urology consultation for cystoscopy to look for bladder cancer unless the bleeding intensifies. We're worried that this could decrease the quality of life through treatment if bladder cancer was found. I will recheck a CBC, CMP, hemoglobin A1c, and TSH for the patient's ear. If her hemoglobin continues to drop, we may need to follow with urology due to blood loss. However I believe this is unlikely given the quantity of blood the daughter describes in the throat on occasion. I have also recommended discontinuation of hydrochlorothiazide as the patient has recently been dehydrated and her blood pressure is borderline low. The hydrochlorothiazide does not appear necessary.  Add a fiber  supplement as a stool bulking agent and also a probiotic to see if we can stop some of the fecal incontinence. If overt diarrhea develops, check the patient for C. Difficile.  08/14/16 Hga1c was 7.4 and acceptable at that time.  However recently, her fasting blood sugars have been between 303 50 in the mornings. Her evening blood sugars are between 180 and 250. She is currently on NPH 18 units in the morning and rapid acting insulin throughout the day as needed. Our goal hemoglobin A1c for this patient is greater than 7 and less than 8. I would like to try to maintain her sugars between 150 and 250. She is recently had several urinary tract infections as documented in her chart. Symptoms have returned again. Now she is more confused, more combative, more lethargic with a foul smell to her odor. Urinalysis shows 20-40 white blood cells, +3 leukocyte esterase. Past Medical History:  Diagnosis Date  . Arthritis   . Asthma   . COPD (chronic obstructive pulmonary disease) ([place])   . Coronary atherosclerosis   . Dyslipidemia   . Gallstones   . History of fractured kneecap   . Hypothyroidism   . Interstitial cystitis   . Malaise and fatigue   . Memory loss   . Polymyalgia rheumatica ([place])   . Systemic hypertension   . Type II or unspecified type diabetes mellitus without mention of complication, not stated as uncontrolled   . UTI (lower urinary tract infection)    Past Surgical History:  Procedure Laterality Date  . CARDIAC CATHETERIZATION  01/29/2006   10% luminal irregularity mid LAD  . CARDIAC CATHETERIZATION  03/15/2001   No significant CAD, no evidence of renal artery stenosis, systemic hypertenion  . CERVICAL SPINE SURGERY    . CHOLECYSTECTOMY    . HERNIA REPAIR    . LUMBAR DISC SURGERY    . NM MYOCAR PERF WALL MOTION  08/17/2009   normal  . ROTATOR CUFF REPAIR    . TONSILLECTOMY    . TOTAL ABDOMINAL HYSTERECTOMY    . US ECHOCARDIOGRAPHY  09/14/2007   mild LVH, LA mildly dilated,mild  mitral annular calcification, AOV mildly sclerotic, aortic root sclerosis/ca+   Current Outpatient Prescriptions on File Prior to Visit  Medication Sig Dispense Refill  . amLODipine (NORVASC) 10 MG tablet Take 1 tablet (10 mg total) by mouth daily. 90 tablet 3  . aspirin 81 MG tablet Take 81 mg by mouth daily.    . Calcium Carbonate-Vit D-Min (CALCIUM 1200 PO) Take 1 tablet by mouth daily.     . Cholecalciferol (VITAMIN D3) 2000 units TABS Take by mouth.    . feeding supplement, ENSURE ENLIVE, (ENSURE ENLIVE) LIQD Take 237 mLs by mouth 2 (two) times daily between meals. (Patient taking differently: Take 237 mLs by mouth daily. ) 237 mL 12  . fentaNYL (DURAGESIC - DOSED MCG/HR) 25 MCG/HR patch Place 25 mcg onto the skin every 3 (three) days.   0  . ferrous sulfate 325 (65 FE) MG tablet Take 325 mg by mouth daily with breakfast.    . hydrochlorothiazide (HYDRODIURIL) 25 MG tablet Take 25 mg by mouth daily.    . hydrochlorothiazide (HYDRODIURIL) 25 MG tablet TAKE ONE TABLET BY MOUTH ONCE DAILY (DISCONTINUE  CLONIDINE) 30 tablet 2  . HYDROcodone-acetaminophen (NORCO/VICODIN) 5-325 MG per tablet Take 1 tablet by mouth as needed.     . insulin lispro (HUMALOG) 100 UNIT/ML injection Inject 0-0.06 mLs (0-6 Units total) into the skin 3 (three) times daily as needed for high blood sugar. Patient uses sliding scale 10 mL 5  . insulin NPH Human (HUMULIN N,NOVOLIN N) 100 UNIT/ML injection Inject 0.18 mLs (18 Units total) into the skin every morning. 10 mL 2  . Insulin Syringe-Needle U-100 31G X 5/16" 1 ML MISC For injection of insulin QID 100 each 5  . levothyroxine (SYNTHROID, LEVOTHROID) 100 MCG tablet TAKE 1 TABLET (100 MCG TOTAL) BY MOUTH DAILY. 90 tablet 1  . memantine (NAMENDA) 10 MG tablet TAKE ONE TABLET BY MOUTH TWICE DAILY 180 tablet 4  . Multiple Vitamins-Minerals (OCUVITE PRESERVISION PO) Take 1 capsule by mouth daily.    . ONE TOUCH ULTRA TEST test strip TEST BS 5-6 TIMES A DAY 200 each 6  .  predniSONE (DELTASONE) 5 MG tablet Take 1 tablet by mouth daily.  3  . psyllium (REGULOID) 0.52 g capsule Take 0.52 g by mouth 2 (two) times daily.    . QUEtiapine (SEROQUEL) 25 MG tablet Take 2 tablets (50 mg total) by mouth at bedtime. 60 tablet 11  . sulfamethoxazole-trimethoprim (BACTRIM DS,SEPTRA DS) 800-160 MG tablet Take 1 tablet by mouth 2 (two) times daily. 14 tablet 0  . telmisartan (MICARDIS) 40 MG tablet Take 1 tablet (40 mg total) by mouth daily. 90 tablet 3  . tiotropium (SPIRIVA HANDIHALER) 18 MCG inhalation capsule Place 1 capsule (18 mcg total) into inhaler and inhale daily. 90 capsule 3  . UNABLE TO FIND Med Name: probiotic capsule   Takes one daily.     No current facility-administered medications on file  prior to visit.    No Known Allergies Social History   Social History  . Marital status: Widowed    Spouse name: N/A  . Number of children: 4  . Years of education: 12   Occupational History  . retired Retired   Social History Main Topics  . Smoking status: Never Smoker  . Smokeless tobacco: Never Used  . Alcohol use No  . Drug use: No  . Sexual activity: Not on file   Other Topics Concern  . Not on file   Social History Narrative   Patient lives at home alone. Widowed.   Retired.   Education High school   Right handed   Caffeine- some times coffee and soda one cup.     Review of Systems  All other systems reviewed and are negative.      Objective:   Physical Exam  Cardiovascular: Normal rate, regular rhythm and normal heart sounds.   Pulmonary/Chest: Effort normal and breath sounds normal. No respiratory distress. She has no wheezes. She has no rales.  Abdominal: Soft. Bowel sounds are normal. She exhibits no distension. There is no tenderness. There is no rebound and no guarding.  Vitals reviewed.         Assessment & Plan:  Abnormal urine odor - Plan: Urinalysis, Routine w reflex microscopic (not at [facility])  Urinalysis appears to show  urinary tract infection again. Begin Bactrim 1 tablet by mouth twice a day for 7 days. I gave the family urine cups to have at home. We decided not to start a daily prophylactic antibiotic due to the risk of bacterial resistance. However if the patient develops symptoms, the family can bring in a urine sample so that we can check it in an effort to keep her from having to come back and forth to the doctor's office during flu season. Continue NPH but increased to 20 units in the morning and add 10 units in the evening. Recheck fasting and two-hour postprandial sugars in one week.

## 2016-08-17 LAB — URINE CULTURE

## 2016-08-20 ENCOUNTER — Inpatient Hospital Stay (HOSPITAL_COMMUNITY)
Admission: EM | Admit: 2016-08-20 | Discharge: 2016-08-25 | DRG: 309 | Disposition: A | Discharge status: Home Health | Payer: Medicare Other | Attending: Internal Medicine | Admitting: Internal Medicine

## 2016-08-20 ENCOUNTER — Other Ambulatory Visit: Payer: Self-pay

## 2016-08-20 ENCOUNTER — Emergency Department (HOSPITAL_COMMUNITY): Payer: Medicare Other

## 2016-08-20 ENCOUNTER — Encounter (HOSPITAL_COMMUNITY): Payer: Self-pay | Admitting: Emergency Medicine

## 2016-08-20 DIAGNOSIS — N390 Urinary tract infection, site not specified: Secondary | ICD-10-CM | POA: Diagnosis not present

## 2016-08-20 DIAGNOSIS — E875 Hyperkalemia: Secondary | ICD-10-CM | POA: Diagnosis present

## 2016-08-20 DIAGNOSIS — R001 Bradycardia, unspecified: Secondary | ICD-10-CM | POA: Diagnosis not present

## 2016-08-20 DIAGNOSIS — Z794 Long term (current) use of insulin: Secondary | ICD-10-CM

## 2016-08-20 DIAGNOSIS — Z7982 Long term (current) use of aspirin: Secondary | ICD-10-CM

## 2016-08-20 DIAGNOSIS — E8770 Fluid overload, unspecified: Secondary | ICD-10-CM | POA: Diagnosis not present

## 2016-08-20 DIAGNOSIS — G309 Alzheimer's disease, unspecified: Secondary | ICD-10-CM | POA: Diagnosis present

## 2016-08-20 DIAGNOSIS — Z6830 Body mass index (BMI) 30.0-30.9, adult: Secondary | ICD-10-CM

## 2016-08-20 DIAGNOSIS — E039 Hypothyroidism, unspecified: Secondary | ICD-10-CM | POA: Diagnosis present

## 2016-08-20 DIAGNOSIS — I499 Cardiac arrhythmia, unspecified: Secondary | ICD-10-CM | POA: Diagnosis not present

## 2016-08-20 DIAGNOSIS — I2510 Atherosclerotic heart disease of native coronary artery without angina pectoris: Secondary | ICD-10-CM | POA: Diagnosis present

## 2016-08-20 DIAGNOSIS — E785 Hyperlipidemia, unspecified: Secondary | ICD-10-CM | POA: Diagnosis present

## 2016-08-20 DIAGNOSIS — Z9049 Acquired absence of other specified parts of digestive tract: Secondary | ICD-10-CM

## 2016-08-20 DIAGNOSIS — N183 Chronic kidney disease, stage 3 (moderate): Secondary | ICD-10-CM | POA: Diagnosis present

## 2016-08-20 DIAGNOSIS — I442 Atrioventricular block, complete: Principal | ICD-10-CM | POA: Diagnosis present

## 2016-08-20 DIAGNOSIS — J449 Chronic obstructive pulmonary disease, unspecified: Secondary | ICD-10-CM | POA: Diagnosis present

## 2016-08-20 DIAGNOSIS — I10 Essential (primary) hypertension: Secondary | ICD-10-CM | POA: Diagnosis present

## 2016-08-20 DIAGNOSIS — J9811 Atelectasis: Secondary | ICD-10-CM | POA: Diagnosis not present

## 2016-08-20 DIAGNOSIS — E1122 Type 2 diabetes mellitus with diabetic chronic kidney disease: Secondary | ICD-10-CM | POA: Diagnosis present

## 2016-08-20 DIAGNOSIS — Z79899 Other long term (current) drug therapy: Secondary | ICD-10-CM

## 2016-08-20 DIAGNOSIS — D649 Anemia, unspecified: Secondary | ICD-10-CM | POA: Diagnosis present

## 2016-08-20 DIAGNOSIS — F0280 Dementia in other diseases classified elsewhere without behavioral disturbance: Secondary | ICD-10-CM | POA: Diagnosis present

## 2016-08-20 DIAGNOSIS — E11649 Type 2 diabetes mellitus with hypoglycemia without coma: Secondary | ICD-10-CM | POA: Diagnosis present

## 2016-08-20 DIAGNOSIS — E162 Hypoglycemia, unspecified: Secondary | ICD-10-CM | POA: Diagnosis not present

## 2016-08-20 DIAGNOSIS — Z7951 Long term (current) use of inhaled steroids: Secondary | ICD-10-CM

## 2016-08-20 DIAGNOSIS — Z7952 Long term (current) use of systemic steroids: Secondary | ICD-10-CM

## 2016-08-20 DIAGNOSIS — R339 Retention of urine, unspecified: Secondary | ICD-10-CM | POA: Diagnosis present

## 2016-08-20 DIAGNOSIS — M353 Polymyalgia rheumatica: Secondary | ICD-10-CM | POA: Diagnosis present

## 2016-08-20 DIAGNOSIS — R0600 Dyspnea, unspecified: Secondary | ICD-10-CM

## 2016-08-20 DIAGNOSIS — E860 Dehydration: Secondary | ICD-10-CM | POA: Diagnosis present

## 2016-08-20 DIAGNOSIS — R5383 Other fatigue: Secondary | ICD-10-CM

## 2016-08-20 DIAGNOSIS — R4182 Altered mental status, unspecified: Secondary | ICD-10-CM

## 2016-08-20 DIAGNOSIS — E871 Hypo-osmolality and hyponatremia: Secondary | ICD-10-CM | POA: Diagnosis present

## 2016-08-20 DIAGNOSIS — I129 Hypertensive chronic kidney disease with stage 1 through stage 4 chronic kidney disease, or unspecified chronic kidney disease: Secondary | ICD-10-CM | POA: Diagnosis present

## 2016-08-20 DIAGNOSIS — N179 Acute kidney failure, unspecified: Secondary | ICD-10-CM | POA: Diagnosis present

## 2016-08-20 DIAGNOSIS — B952 Enterococcus as the cause of diseases classified elsewhere: Secondary | ICD-10-CM | POA: Diagnosis not present

## 2016-08-20 LAB — I-STAT CHEM 8, ED
BUN: 43 mg/dL — AB (ref 6–20)
CALCIUM ION: 1.09 mmol/L — AB (ref 1.15–1.40)
Chloride: 97 mmol/L — ABNORMAL LOW (ref 101–111)
Creatinine, Ser: 2.1 mg/dL — ABNORMAL HIGH (ref 0.44–1.00)
Glucose, Bld: 28 mg/dL — CL (ref 65–99)
HEMATOCRIT: 26 % — AB (ref 36.0–46.0)
HEMOGLOBIN: 8.8 g/dL — AB (ref 12.0–15.0)
Potassium: 6 mmol/L — ABNORMAL HIGH (ref 3.5–5.1)
SODIUM: 128 mmol/L — AB (ref 135–145)
TCO2: 22 mmol/L (ref 0–100)

## 2016-08-20 MED ORDER — ONDANSETRON HCL 4 MG/2ML IJ SOLN
INTRAMUSCULAR | Status: AC
  Administered 2016-08-20: 4 mg
  Filled 2016-08-20: qty 2

## 2016-08-20 NOTE — ED Provider Notes (Signed)
[facility] DEPT Provider Note   CSN: [id number] Arrival date & time: 08/20/16  2247     History   Chief Complaint Chief Complaint  Patient presents with  . Weakness  . Bradycardia    HPI Michelle [name] is a 82 y.o. female.  Michelle [name] presents to ED via EMS from home for AMS per family, concerned she may be hypoglycemic, but found to have a BG in 70's by EMS. AMS consists of confusion, somnolence, but arousable. Found by EMS to be bradycardic to 30, BP initially 110/70, repeat 90's/50's, for which 500cc bolus given and epi drip started en route. On review of EMS 12-lead, appears to be in complete heart block. Pt endorses some dyspnea, fatigue x 1 day, no chest pain or discomfort. Nausea and vomiting on arrival.    The history is provided by the patient and the EMS personnel. The history is limited by the condition of the patient (acuity of condition).    Past Medical History:  Diagnosis Date  . Arthritis   . Asthma   . COPD (chronic obstructive pulmonary disease) ([place])   . Coronary atherosclerosis   . Dyslipidemia   . Gallstones   . History of fractured kneecap   . Hypothyroidism   . Interstitial cystitis   . Malaise and fatigue   . Memory loss   . Polymyalgia rheumatica ([place])   . Systemic hypertension   . Type II or unspecified type diabetes mellitus without mention of complication, not stated as uncontrolled   . UTI (lower urinary tract infection)     Patient Active Problem List   Diagnosis Date Noted  . Hypoglycemia 08/21/2016  . Fecal incontinence 06/27/2016  . Diarrhea 06/27/2016  . E-coli UTI 04/26/2016  . Pressure ulcer 04/24/2016  . Hematuria, gross 04/23/2016  . Alzheimer's dementia 04/23/2016  . Nausea and vomiting 03/10/2016  . Diabetes mellitus with complication ([place])   . Chronic low back pain 09/11/2015  . HLD (hyperlipidemia) 06/13/2015  . Nausea & vomiting 06/12/2015  . Acute encephalopathy 06/12/2015  . CAD (coronary artery disease)  06/12/2015  . Acute renal failure superimposed on stage 3 chronic kidney disease ([place]) 06/12/2015  . Normocytic anemia 06/12/2015  . Chronic diarrhea 06/12/2015  . Hypothyroidism   . Coronary atherosclerosis   . Thyroid activity decreased   . Symptomatic sinus bradycardia 01/16/2014  . Abnormality of gait 11/24/2013  . Hip pain 11/24/2013  . Knee pain 11/24/2013  . Memory loss 05/23/2013  . HYPERLIPIDEMIA 01/08/2008  . Asthma with COPD ([place]) 01/06/2008  . Polymyalgia rheumatica ([place]) 01/06/2008  . DYSPNEA 12/17/2007    Past Surgical History:  Procedure Laterality Date  . CARDIAC CATHETERIZATION  01/29/2006   10% luminal irregularity mid LAD  . CARDIAC CATHETERIZATION  03/15/2001   No significant CAD, no evidence of renal artery stenosis, systemic hypertenion  . CERVICAL SPINE SURGERY    . CHOLECYSTECTOMY    . HERNIA REPAIR    . LUMBAR DISC SURGERY    . NM MYOCAR PERF WALL MOTION  08/17/2009   normal  . ROTATOR CUFF REPAIR    . TONSILLECTOMY    . TOTAL ABDOMINAL HYSTERECTOMY    . US ECHOCARDIOGRAPHY  09/14/2007   mild LVH, LA mildly dilated,mild mitral annular calcification, AOV mildly sclerotic, aortic root sclerosis/ca+    OB History    No data available       Home Medications    Prior to Admission medications   Medication Sig Start Date End Date  Taking? Authorizing Provider  amLODipine (NORVASC) 10 MG tablet Take 1 tablet (10 mg total) by mouth daily. 12/18/15   Susy Frizzle, MD  aspirin 81 MG tablet Take 81 mg by mouth daily.    Historical Provider, MD  Calcium Carbonate-Vit D-Min (CALCIUM 1200 PO) Take 1 tablet by mouth daily.     Historical Provider, MD  Cholecalciferol (VITAMIN D3) 2000 units TABS Take by mouth.    Historical Provider, MD  feeding supplement, ENSURE ENLIVE, (ENSURE ENLIVE) LIQD Take 237 mLs by mouth 2 (two) times daily between meals. Patient taking differently: Take 237 mLs by mouth daily.  04/26/16   Nishant Dhungel, MD  fentaNYL (DURAGESIC -  DOSED MCG/HR) 25 MCG/HR patch Place 25 mcg onto the skin every 3 (three) days.  05/26/16   Historical Provider, MD  ferrous sulfate 325 (65 FE) MG tablet Take 325 mg by mouth daily with breakfast.    Historical Provider, MD  hydrochlorothiazide (HYDRODIURIL) 25 MG tablet TAKE ONE TABLET BY MOUTH ONCE DAILY (DISCONTINUE  CLONIDINE) 07/30/16   Susy Frizzle, MD  HYDROcodone-acetaminophen (NORCO/VICODIN) 5-325 MG per tablet Take 1 tablet by mouth as needed.     Historical Provider, MD  insulin lispro (HUMALOG) 100 UNIT/ML injection Inject 0-0.06 mLs (0-6 Units total) into the skin 3 (three) times daily as needed for high blood sugar. Patient uses sliding scale 05/26/16   Susy Frizzle, MD  insulin NPH Human (HUMULIN N,NOVOLIN N) 100 UNIT/ML injection Inject 0.18 mLs (18 Units total) into the skin every morning. 05/26/16   Susy Frizzle, MD  Insulin Syringe-Needle U-100 31G X 5/16" 1 ML MISC For injection of insulin QID 07/29/16   Susy Frizzle, MD  levothyroxine (SYNTHROID, LEVOTHROID) 100 MCG tablet TAKE 1 TABLET (100 MCG TOTAL) BY MOUTH DAILY. 01/28/16   Susy Frizzle, MD  memantine (NAMENDA) 10 MG tablet TAKE ONE TABLET BY MOUTH TWICE DAILY 02/14/16   Marcial Pacas, MD  Multiple Vitamins-Minerals (OCUVITE PRESERVISION PO) Take 1 capsule by mouth daily.    Historical Provider, MD  ONE TOUCH ULTRA TEST test strip TEST BS 5-6 TIMES A DAY 12/27/15   Susy Frizzle, MD  predniSONE (DELTASONE) 5 MG tablet Take 1 tablet by mouth daily. 04/15/16   Historical Provider, MD  QUEtiapine (SEROQUEL) 25 MG tablet Take 2 tablets (50 mg total) by mouth at bedtime. 12/12/15   Marcial Pacas, MD  telmisartan (MICARDIS) 40 MG tablet Take 1 tablet (40 mg total) by mouth daily. 04/01/16   Susy Frizzle, MD  tiotropium (SPIRIVA HANDIHALER) 18 MCG inhalation capsule Place 1 capsule (18 mcg total) into inhaler and inhale daily. 03/17/16 02/26/18  Deneise Lever, MD  UNABLE TO FIND Med Name: probiotic capsule   Takes one daily.     Historical Provider, MD    Family History Family History  Problem Relation Age of Onset  . Breast cancer Mother   . Heart disease Father   . Lung cancer Brother     Social History Social History  Substance Use Topics  . Smoking status: Never Smoker  . Smokeless tobacco: Never Used  . Alcohol use No     Allergies   Review of patient's allergies indicates no known allergies.   Review of Systems Review of Systems  Constitutional: Positive for fatigue. Negative for fever.  HENT: Negative for congestion.   Respiratory: Positive for shortness of breath.   Cardiovascular: Negative for chest pain.  Gastrointestinal: Positive for diarrhea, nausea and vomiting. Negative  for abdominal pain.       N/v/d started yesterday  Genitourinary: Negative for flank pain.  Musculoskeletal: Negative for arthralgias and myalgias.  Skin: Negative for rash.  Neurological: Negative for headaches.  Psychiatric/Behavioral: Positive for confusion.     Physical Exam Updated Vital Signs BP (!) 107/40   Pulse (!) 50   Temp 98.7 F (37.1 C) (Oral)   Resp 10   Ht 5\' 3"  (1.6 m)   Wt 77.1 kg   SpO2 97%   BMI 30.11 kg/m   Physical Exam  Constitutional: She is oriented to person, place, and time. She appears well-developed and well-nourished.  Thin and toxic-appearing, actively vomiting stomach contents, responsive but confused  HENT:  Head: Normocephalic and atraumatic.  Eyes: Conjunctivae are normal. Pupils are equal, round, and reactive to light. No scleral icterus.  Neck: Normal range of motion. Neck supple. JVD present. No tracheal deviation present.  Cardiovascular: Regular rhythm and intact distal pulses.   Murmur heard. Bradycardic to 40 on arrival but intact radial pulses bilaterally  Pulmonary/Chest: Effort normal. No respiratory distress. She has no wheezes. She has rales.  B/l lower field fine rales  Abdominal: Soft. She exhibits no distension. There is no tenderness.    Musculoskeletal: She exhibits no edema or tenderness.  Symmetric size and appearance of b/l Le's, no calf swelling or tenderness. Warm and perfused. Noted to have fentanyl patch to right shoulder, which is removed.  Neurological: She is alert and oriented to person, place, and time. She exhibits normal muscle tone. Coordination normal.  Skin: Skin is warm and dry. Capillary refill takes 2 to 3 seconds. No rash noted. She is not diaphoretic.  Psychiatric: She has a normal mood and affect.  Nursing note and vitals reviewed.    ED Treatments / Results  Labs (all labs ordered are listed, but only abnormal results are displayed) Labs Reviewed  BASIC METABOLIC PANEL - Abnormal; Notable for the following:       Result Value   Sodium 129 (*)    Potassium 6.0 (*)    Chloride 98 (*)    Glucose, Bld 30 (*)    BUN 46 (*)    Creatinine, Ser 2.06 (*)    Calcium 8.4 (*)    GFR calc non Af Amer 21 (*)    GFR calc Af Amer 25 (*)    All other components within normal limits  CBC WITH DIFFERENTIAL/PLATELET - Abnormal; Notable for the following:    RBC 2.81 (*)    Hemoglobin 8.8 (*)    HCT 26.0 (*)    All other components within normal limits  I-STAT TROPOININ, ED - Abnormal; Notable for the following:    Troponin i, poc 0.11 (*)    All other components within normal limits  I-STAT CHEM 8, ED - Abnormal; Notable for the following:    Sodium 128 (*)    Potassium 6.0 (*)    Chloride 97 (*)    BUN 43 (*)    Creatinine, Ser 2.10 (*)    Glucose, Bld 28 (*)    Calcium, Ion 1.09 (*)    Hemoglobin 8.8 (*)    HCT 26.0 (*)    All other components within normal limits    EKG  EKG Interpretation None       Radiology Dg Chest Portable 1 View  Result Date: 08/21/2016 CLINICAL DATA:  Pulmonary edema EXAM: PORTABLE CHEST 1 VIEW COMPARISON:  03/10/2016 FINDINGS: AP portable semi-erect view of the chest. Central  chest obscured by pacer patch. Mildly low lung volumes. Minimal atelectasis at the  right CP angle. No acute consolidation. Mild cardiomegaly. Atherosclerosis of the aorta. No pneumothorax. Presumed clips in the right upper quadrant. IMPRESSION: 1. Low lung volumes with minimal atelectasis right CP angle. 2. Borderline to mild cardiomegaly with prominent central pulmonary arteries. 3. Atherosclerotic vascular calcification of the aorta Electronically Signed   By: Donavan Foil M.D.   On: 08/21/2016 00:01    Procedures Procedures (including critical care time)  Medications Ordered in ED Medications  ondansetron (ZOFRAN) 4 MG/2ML injection (4 mg  Given 08/20/16 2257)  dextrose 50 % solution 50 mL (50 mLs Intravenous Given 08/21/16 0006)  hydrocortisone sodium succinate (SOLU-CORTEF) 100 MG injection 100 mg (100 mg Intravenous Given 08/21/16 0035)     Initial Impression / Assessment and Plan / ED Course  I have reviewed the triage vital signs and the nursing notes.  Pertinent labs & imaging results that were available during my care of the patient were reviewed by me and considered in my medical decision making (see chart for details).  Clinical Course   ROSSALYN COURTADE is a 82 y.o. female with h/o sinus node dysfunction, for which it was recommended that she have a dual-chamber pacemaker in 01/2014 but refused, hypothyroid, who presents to ED from home for confusion/AMS and somnolence noted by her daughter this evening. Pt has had 2 days of n/v/d, no noted fevers. Found by EMS to have HR 30 and initial BP 110/70 in complete heart block and lethargic per EMS on their arrival, not speaking. Pressure dropped en route to SBP 90, for which epi drip was started, discontinued on arrival here, as mentating well, vomiting, normal BP on arrival but bradycardic to 40, not in heart block here. BG per EMS en route 76. Here 28, given D50 and fluid bolus. Hydrocortisone given per recommendation of hospitalist for possible adrenal crisis, as pt is on prednisone daily at home. Troponin elevated to  0.11, likely 2/2 coronary hypoperfusion with the bradycardia. Admitted to stepdown unit for further management.   Pt condition, course, and admission were discussed with attending physician Dr. Noemi Chapel.   Final Clinical Impressions(s) / ED Diagnoses   Final diagnoses:  Bradycardia  Hypoglycemia  Hyperkalemia    New Prescriptions New Prescriptions   No medications on file     Paralee Cancel, MD 08/21/16 [id number]    Noemi Chapel, MD 08/23/16 [phone]

## 2016-08-20 NOTE — ED Triage Notes (Signed)
Pt. arrived with EMS from home family reported generalized weakness/fatigue for several days , emesis yesterday , CBG = 79 , received NS 500 ml by EMS , HR = 30's , started on Epinephrine drip by EMS, denies chest pain /respirations unlabored , BP = 129/80 .

## 2016-08-21 ENCOUNTER — Ambulatory Visit (HOSPITAL_COMMUNITY): Admit: 2016-08-21 | Payer: Medicare Other | Admitting: Interventional Cardiology

## 2016-08-21 ENCOUNTER — Encounter (HOSPITAL_COMMUNITY)
Admission: EM | Disposition: A | Discharge status: Home Health | Payer: Self-pay | Source: Home / Self Care | Attending: Internal Medicine

## 2016-08-21 ENCOUNTER — Encounter (HOSPITAL_COMMUNITY): Payer: Self-pay | Admitting: Internal Medicine

## 2016-08-21 DIAGNOSIS — R0600 Dyspnea, unspecified: Secondary | ICD-10-CM | POA: Diagnosis not present

## 2016-08-21 DIAGNOSIS — J449 Chronic obstructive pulmonary disease, unspecified: Secondary | ICD-10-CM | POA: Diagnosis present

## 2016-08-21 DIAGNOSIS — D649 Anemia, unspecified: Secondary | ICD-10-CM | POA: Diagnosis present

## 2016-08-21 DIAGNOSIS — E1122 Type 2 diabetes mellitus with diabetic chronic kidney disease: Secondary | ICD-10-CM | POA: Diagnosis present

## 2016-08-21 DIAGNOSIS — E785 Hyperlipidemia, unspecified: Secondary | ICD-10-CM | POA: Diagnosis present

## 2016-08-21 DIAGNOSIS — E875 Hyperkalemia: Secondary | ICD-10-CM | POA: Diagnosis not present

## 2016-08-21 DIAGNOSIS — F0280 Dementia in other diseases classified elsewhere without behavioral disturbance: Secondary | ICD-10-CM | POA: Diagnosis not present

## 2016-08-21 DIAGNOSIS — G308 Other Alzheimer's disease: Secondary | ICD-10-CM | POA: Diagnosis not present

## 2016-08-21 DIAGNOSIS — E039 Hypothyroidism, unspecified: Secondary | ICD-10-CM | POA: Diagnosis present

## 2016-08-21 DIAGNOSIS — I2510 Atherosclerotic heart disease of native coronary artery without angina pectoris: Secondary | ICD-10-CM | POA: Diagnosis present

## 2016-08-21 DIAGNOSIS — Z6830 Body mass index (BMI) 30.0-30.9, adult: Secondary | ICD-10-CM | POA: Diagnosis not present

## 2016-08-21 DIAGNOSIS — N179 Acute kidney failure, unspecified: Secondary | ICD-10-CM

## 2016-08-21 DIAGNOSIS — I442 Atrioventricular block, complete: Secondary | ICD-10-CM | POA: Diagnosis present

## 2016-08-21 DIAGNOSIS — R001 Bradycardia, unspecified: Secondary | ICD-10-CM | POA: Diagnosis not present

## 2016-08-21 DIAGNOSIS — I10 Essential (primary) hypertension: Secondary | ICD-10-CM | POA: Diagnosis present

## 2016-08-21 DIAGNOSIS — Z7952 Long term (current) use of systemic steroids: Secondary | ICD-10-CM | POA: Diagnosis not present

## 2016-08-21 DIAGNOSIS — N390 Urinary tract infection, site not specified: Secondary | ICD-10-CM | POA: Diagnosis not present

## 2016-08-21 DIAGNOSIS — Z9049 Acquired absence of other specified parts of digestive tract: Secondary | ICD-10-CM | POA: Diagnosis not present

## 2016-08-21 DIAGNOSIS — Z794 Long term (current) use of insulin: Secondary | ICD-10-CM | POA: Diagnosis not present

## 2016-08-21 DIAGNOSIS — E162 Hypoglycemia, unspecified: Secondary | ICD-10-CM

## 2016-08-21 DIAGNOSIS — Z7951 Long term (current) use of inhaled steroids: Secondary | ICD-10-CM | POA: Diagnosis not present

## 2016-08-21 DIAGNOSIS — E871 Hypo-osmolality and hyponatremia: Secondary | ICD-10-CM | POA: Diagnosis present

## 2016-08-21 DIAGNOSIS — Z7982 Long term (current) use of aspirin: Secondary | ICD-10-CM | POA: Diagnosis not present

## 2016-08-21 DIAGNOSIS — I129 Hypertensive chronic kidney disease with stage 1 through stage 4 chronic kidney disease, or unspecified chronic kidney disease: Secondary | ICD-10-CM | POA: Diagnosis present

## 2016-08-21 DIAGNOSIS — R0602 Shortness of breath: Secondary | ICD-10-CM | POA: Diagnosis not present

## 2016-08-21 DIAGNOSIS — Z79899 Other long term (current) drug therapy: Secondary | ICD-10-CM | POA: Diagnosis not present

## 2016-08-21 DIAGNOSIS — I639 Cerebral infarction, unspecified: Secondary | ICD-10-CM | POA: Diagnosis not present

## 2016-08-21 DIAGNOSIS — E11649 Type 2 diabetes mellitus with hypoglycemia without coma: Secondary | ICD-10-CM | POA: Diagnosis present

## 2016-08-21 DIAGNOSIS — R55 Syncope and collapse: Secondary | ICD-10-CM | POA: Diagnosis not present

## 2016-08-21 DIAGNOSIS — G309 Alzheimer's disease, unspecified: Secondary | ICD-10-CM

## 2016-08-21 DIAGNOSIS — N183 Chronic kidney disease, stage 3 (moderate): Secondary | ICD-10-CM | POA: Diagnosis present

## 2016-08-21 DIAGNOSIS — M353 Polymyalgia rheumatica: Secondary | ICD-10-CM | POA: Diagnosis present

## 2016-08-21 HISTORY — PX: CARDIAC CATHETERIZATION: SHX172

## 2016-08-21 LAB — CBC WITH DIFFERENTIAL/PLATELET
Basophils Absolute: 0 10*3/uL (ref 0.0–0.1)
Basophils Relative: 0 %
Eosinophils Absolute: 0 10*3/uL (ref 0.0–0.7)
Eosinophils Relative: 1 %
HCT: 26 % — ABNORMAL LOW (ref 36.0–46.0)
Hemoglobin: 8.8 g/dL — ABNORMAL LOW (ref 12.0–15.0)
Lymphocytes Relative: 12 %
Lymphs Abs: 0.7 10*3/uL (ref 0.7–4.0)
MCH: 31.3 pg (ref 26.0–34.0)
MCHC: 33.8 g/dL (ref 30.0–36.0)
MCV: 92.5 fL (ref 78.0–100.0)
Monocytes Absolute: 0.4 10*3/uL (ref 0.1–1.0)
Monocytes Relative: 7 %
Neutro Abs: 5 10*3/uL (ref 1.7–7.7)
Neutrophils Relative %: 80 %
Platelets: 215 10*3/uL (ref 150–400)
RBC: 2.81 MIL/uL — ABNORMAL LOW (ref 3.87–5.11)
RDW: 13.2 % (ref 11.5–15.5)
WBC: 6.2 10*3/uL (ref 4.0–10.5)

## 2016-08-21 LAB — CBC
HEMATOCRIT: 26.4 % — AB (ref 36.0–46.0)
Hemoglobin: 8.9 g/dL — ABNORMAL LOW (ref 12.0–15.0)
MCH: 31.6 pg (ref 26.0–34.0)
MCHC: 33.7 g/dL (ref 30.0–36.0)
MCV: 93.6 fL (ref 78.0–100.0)
PLATELETS: 212 10*3/uL (ref 150–400)
RBC: 2.82 MIL/uL — AB (ref 3.87–5.11)
RDW: 13.3 % (ref 11.5–15.5)
WBC: 6.8 10*3/uL (ref 4.0–10.5)

## 2016-08-21 LAB — BASIC METABOLIC PANEL
ANION GAP: 9 (ref 5–15)
ANION GAP: 8 (ref 5–15)
Anion gap: 8 (ref 5–15)
Anion gap: 9 (ref 5–15)
BUN: 39 mg/dL — ABNORMAL HIGH (ref 6–20)
BUN: 43 mg/dL — ABNORMAL HIGH (ref 6–20)
BUN: 44 mg/dL — ABNORMAL HIGH (ref 6–20)
BUN: 46 mg/dL — ABNORMAL HIGH (ref 6–20)
CALCIUM: 9.2 mg/dL (ref 8.9–10.3)
CHLORIDE: 99 mmol/L — AB (ref 101–111)
CO2: 20 mmol/L — AB (ref 22–32)
CO2: 20 mmol/L — AB (ref 22–32)
CO2: 21 mmol/L — ABNORMAL LOW (ref 22–32)
CO2: 22 mmol/L (ref 22–32)
CREATININE: 1.95 mg/dL — AB (ref 0.44–1.00)
Calcium: 8.4 mg/dL — ABNORMAL LOW (ref 8.9–10.3)
Calcium: 8.7 mg/dL — ABNORMAL LOW (ref 8.9–10.3)
Calcium: 9.1 mg/dL (ref 8.9–10.3)
Chloride: 98 mmol/L — ABNORMAL LOW (ref 101–111)
Chloride: 98 mmol/L — ABNORMAL LOW (ref 101–111)
Chloride: 99 mmol/L — ABNORMAL LOW (ref 101–111)
Creatinine, Ser: 1.81 mg/dL — ABNORMAL HIGH (ref 0.44–1.00)
Creatinine, Ser: 1.88 mg/dL — ABNORMAL HIGH (ref 0.44–1.00)
Creatinine, Ser: 2.06 mg/dL — ABNORMAL HIGH (ref 0.44–1.00)
GFR calc Af Amer: 25 mL/min — ABNORMAL LOW (ref >60)
GFR calc Af Amer: 29 mL/min — ABNORMAL LOW (ref >60)
GFR calc non Af Amer: 21 mL/min — ABNORMAL LOW (ref >60)
GFR calc non Af Amer: 23 mL/min — ABNORMAL LOW (ref >60)
GFR calc non Af Amer: 24 mL/min — ABNORMAL LOW (ref >60)
GFR, EST AFRICAN AMERICAN: 27 mL/min — AB (ref >60)
GFR, EST AFRICAN AMERICAN: 28 mL/min — AB (ref >60)
GFR, EST NON AFRICAN AMERICAN: 25 mL/min — AB (ref >60)
GLUCOSE: 167 mg/dL — AB (ref 65–99)
Glucose, Bld: 151 mg/dL — ABNORMAL HIGH (ref 65–99)
Glucose, Bld: 245 mg/dL — ABNORMAL HIGH (ref 65–99)
Glucose, Bld: 30 mg/dL — CL (ref 65–99)
POTASSIUM: 5.5 mmol/L — AB (ref 3.5–5.1)
POTASSIUM: 4.8 mmol/L (ref 3.5–5.1)
Potassium: 6 mmol/L — ABNORMAL HIGH (ref 3.5–5.1)
Potassium: 6.2 mmol/L — ABNORMAL HIGH (ref 3.5–5.1)
SODIUM: 128 mmol/L — AB (ref 135–145)
Sodium: 127 mmol/L — ABNORMAL LOW (ref 135–145)
Sodium: 127 mmol/L — ABNORMAL LOW (ref 135–145)
Sodium: 129 mmol/L — ABNORMAL LOW (ref 135–145)

## 2016-08-21 LAB — CBG MONITORING, ED
GLUCOSE-CAPILLARY: 97 mg/dL (ref 65–99)
GLUCOSE-CAPILLARY: 177 mg/dL — AB (ref 65–99)
Glucose-Capillary: 169 mg/dL — ABNORMAL HIGH (ref 65–99)
Glucose-Capillary: 85 mg/dL (ref 65–99)

## 2016-08-21 LAB — SODIUM, URINE, RANDOM: Sodium, Ur: 52 mmol/L

## 2016-08-21 LAB — TROPONIN I
TROPONIN I: 0.15 ng/mL — AB (ref <0.03)
Troponin I: 0.17 ng/mL (ref <0.03)
Troponin I: 0.2 ng/mL (ref <0.03)

## 2016-08-21 LAB — I-STAT TROPONIN, ED: Troponin i, poc: 0.11 ng/mL (ref 0.00–0.08)

## 2016-08-21 LAB — URINALYSIS, ROUTINE W REFLEX MICROSCOPIC
BILIRUBIN URINE: NEGATIVE
Glucose, UA: NEGATIVE mg/dL
KETONES UR: NEGATIVE mg/dL
NITRITE: POSITIVE — AB
PH: 5.5 (ref 5.0–8.0)
Protein, ur: 100 mg/dL — AB
Specific Gravity, Urine: 1.008 (ref 1.005–1.030)

## 2016-08-21 LAB — CREATININE, URINE, RANDOM: Creatinine, Urine: 23.4 mg/dL

## 2016-08-21 LAB — URINE MICROSCOPIC-ADD ON

## 2016-08-21 LAB — GLUCOSE, CAPILLARY
GLUCOSE-CAPILLARY: 210 mg/dL — AB (ref 65–99)
GLUCOSE-CAPILLARY: 131 mg/dL — AB (ref 65–99)
Glucose-Capillary: 292 mg/dL — ABNORMAL HIGH (ref 65–99)
Glucose-Capillary: 295 mg/dL — ABNORMAL HIGH (ref 65–99)

## 2016-08-21 LAB — MRSA PCR SCREENING: MRSA by PCR: NEGATIVE

## 2016-08-21 LAB — TSH: TSH: 2.284 u[IU]/mL (ref 0.350–4.500)

## 2016-08-21 SURGERY — TEMPORARY PACEMAKER
Anesthesia: LOCAL

## 2016-08-21 MED ORDER — LORAZEPAM 2 MG/ML IJ SOLN
0.5000 mg | Freq: Once | INTRAMUSCULAR | Status: AC
  Administered 2016-08-21: 0.5 mg via INTRAVENOUS
  Filled 2016-08-21: qty 1

## 2016-08-21 MED ORDER — ACETAMINOPHEN 650 MG RE SUPP: 650.0000 mg | Freq: Four times a day (QID) | RECTAL | Status: DC | PRN

## 2016-08-21 MED ORDER — SODIUM CHLORIDE 0.9 % IV SOLN
1.0000 g | Freq: Once | INTRAVENOUS | Status: AC
  Administered 2016-08-21: 1 g via INTRAVENOUS
  Filled 2016-08-21: qty 10

## 2016-08-21 MED ORDER — SODIUM POLYSTYRENE SULFONATE 15 GM/60ML PO SUSP
30.0000 g | Freq: Once | ORAL | Status: DC
Start: 2016-08-21 — End: 2016-08-21

## 2016-08-21 MED ORDER — SODIUM POLYSTYRENE SULFONATE 15 GM/60ML PO SUSP
15.0000 g | Freq: Once | ORAL | Status: AC
  Administered 2016-08-21: 15 g via RECTAL

## 2016-08-21 MED ORDER — SODIUM CHLORIDE 0.9 % IV SOLN
1.0000 g | Freq: Once | INTRAVENOUS | Status: AC
  Administered 2016-08-21: 1 g via INTRAVENOUS

## 2016-08-21 MED ORDER — FERROUS SULFATE 325 (65 FE) MG PO TABS
325.0000 mg | ORAL_TABLET | Freq: Every day | ORAL | Status: DC
  Administered 2016-08-22 – 2016-08-25 (×3): 325 mg via ORAL
  Filled 2016-08-21 (×5): qty 1

## 2016-08-21 MED ORDER — FUROSEMIDE 10 MG/ML IJ SOLN
40.0000 mg | Freq: Once | INTRAMUSCULAR | Status: AC
  Administered 2016-08-21: 40 mg via INTRAVENOUS
  Filled 2016-08-21: qty 4

## 2016-08-21 MED ORDER — LIDOCAINE HCL (PF) 1 % IJ SOLN
INTRAMUSCULAR | Status: AC
  Filled 2016-08-21: qty 30

## 2016-08-21 MED ORDER — HYDROCORTISONE NA SUCCINATE PF 100 MG IJ SOLR
50.0000 mg | Freq: Four times a day (QID) | INTRAMUSCULAR | Status: DC
  Administered 2016-08-21 – 2016-08-22 (×5): 50 mg via INTRAVENOUS
  Filled 2016-08-21 (×5): qty 2

## 2016-08-21 MED ORDER — HEPARIN (PORCINE) IN NACL 2-0.9 UNIT/ML-% IJ SOLN
INTRAMUSCULAR | Status: DC | PRN
  Administered 2016-08-21: 500 mL

## 2016-08-21 MED ORDER — ALBUTEROL SULFATE (2.5 MG/3ML) 0.083% IN NEBU
5.0000 mg | INHALATION_SOLUTION | Freq: Once | RESPIRATORY_TRACT | Status: AC
  Administered 2016-08-21: 5 mg via RESPIRATORY_TRACT
  Filled 2016-08-21: qty 6

## 2016-08-21 MED ORDER — ONDANSETRON HCL 4 MG PO TABS: 4.0000 mg | ORAL_TABLET | Freq: Four times a day (QID) | ORAL | Status: DC | PRN

## 2016-08-21 MED ORDER — INSULIN ASPART 100 UNIT/ML ~~LOC~~ SOLN
0.0000 [IU] | Freq: Three times a day (TID) | SUBCUTANEOUS | Status: DC
  Administered 2016-08-21: 3 [IU] via SUBCUTANEOUS
  Administered 2016-08-21: 5 [IU] via SUBCUTANEOUS
  Administered 2016-08-22: 1 [IU] via SUBCUTANEOUS
  Administered 2016-08-22: 3 [IU] via SUBCUTANEOUS
  Administered 2016-08-22: 1 [IU] via SUBCUTANEOUS
  Administered 2016-08-23: 2 [IU] via SUBCUTANEOUS
  Administered 2016-08-23: 7 [IU] via SUBCUTANEOUS
  Administered 2016-08-24: 2 [IU] via SUBCUTANEOUS
  Administered 2016-08-25: 5 [IU] via SUBCUTANEOUS
  Administered 2016-08-25: 7 [IU] via SUBCUTANEOUS

## 2016-08-21 MED ORDER — SODIUM CHLORIDE 0.9 % IV SOLN
INTRAVENOUS | Status: DC
  Administered 2016-08-21: 02:00:00 via INTRAVENOUS

## 2016-08-21 MED ORDER — ACETAMINOPHEN 325 MG PO TABS
650.0000 mg | ORAL_TABLET | Freq: Four times a day (QID) | ORAL | Status: DC | PRN
  Filled 2016-08-21: qty 2

## 2016-08-21 MED ORDER — SODIUM POLYSTYRENE SULFONATE 15 GM/60ML PO SUSP
30.0000 g | Freq: Once | ORAL | Status: AC
  Administered 2016-08-21: 30 g via RECTAL
  Filled 2016-08-21: qty 120

## 2016-08-21 MED ORDER — TIOTROPIUM BROMIDE MONOHYDRATE 18 MCG IN CAPS
18.0000 ug | ORAL_CAPSULE | Freq: Every day | RESPIRATORY_TRACT | Status: DC
  Administered 2016-08-22 – 2016-08-25 (×4): 18 ug via RESPIRATORY_TRACT
  Filled 2016-08-21: qty 5

## 2016-08-21 MED ORDER — HEPARIN SODIUM (PORCINE) 5000 UNIT/ML IJ SOLN
5000.0000 [IU] | Freq: Three times a day (TID) | INTRAMUSCULAR | Status: DC
  Administered 2016-08-21 – 2016-08-22 (×3): 5000 [IU] via SUBCUTANEOUS
  Filled 2016-08-21 (×3): qty 1

## 2016-08-21 MED ORDER — SODIUM CHLORIDE 0.9 % IV BOLUS (SEPSIS)
1000.0000 mL | Freq: Once | INTRAVENOUS | Status: AC
  Administered 2016-08-21: 1000 mL via INTRAVENOUS

## 2016-08-21 MED ORDER — SODIUM POLYSTYRENE SULFONATE 15 GM/60ML PO SUSP
15.0000 g | Freq: Once | ORAL | Status: AC
  Administered 2016-08-21: 15 g via ORAL
  Filled 2016-08-21: qty 60

## 2016-08-21 MED ORDER — HYDRALAZINE HCL 20 MG/ML IJ SOLN: 5.0000 mg | INTRAMUSCULAR | Status: DC | PRN

## 2016-08-21 MED ORDER — ONDANSETRON HCL 4 MG/2ML IJ SOLN: 4.0000 mg | Freq: Four times a day (QID) | INTRAMUSCULAR | Status: DC | PRN

## 2016-08-21 MED ORDER — DEXTROSE 50 % IV SOLN
1.0000 | Freq: Once | INTRAVENOUS | Status: AC
Start: 2016-08-21 — End: 2016-08-21
  Administered 2016-08-21: 50 mL via INTRAVENOUS
  Filled 2016-08-21: qty 50

## 2016-08-21 MED ORDER — LIDOCAINE HCL (PF) 1 % IJ SOLN
INTRAMUSCULAR | Status: DC | PRN
  Administered 2016-08-21: 2 mL via INTRADERMAL

## 2016-08-21 MED ORDER — HYDROCORTISONE NA SUCCINATE PF 100 MG IJ SOLR
100.0000 mg | Freq: Once | INTRAMUSCULAR | Status: AC
  Administered 2016-08-21: 100 mg via INTRAVENOUS
  Filled 2016-08-21: qty 2

## 2016-08-21 MED ORDER — SODIUM CHLORIDE 0.9 % IV SOLN
INTRAVENOUS | Status: DC
  Administered 2016-08-21 – 2016-08-23 (×2): via INTRAVENOUS

## 2016-08-21 MED ORDER — HEPARIN (PORCINE) IN NACL 2-0.9 UNIT/ML-% IJ SOLN
INTRAMUSCULAR | Status: AC
  Filled 2016-08-21: qty 1000

## 2016-08-21 MED ORDER — ASPIRIN EC 81 MG PO TBEC
81.0000 mg | DELAYED_RELEASE_TABLET | Freq: Every day | ORAL | Status: DC
  Administered 2016-08-21 – 2016-08-24 (×4): 81 mg via ORAL
  Filled 2016-08-21 (×4): qty 1

## 2016-08-21 MED FILL — Medication: Qty: 1 | Status: AC

## 2016-08-21 SURGICAL SUPPLY — 7 items
CATH S G BIP PACING (SET/KITS/TRAYS/PACK) ×1 IMPLANT
CATH SWAN GANZ 7F STRAIGHT (CATHETERS) ×1 IMPLANT
COVER PRB 48X5XTLSCP FOLD TPE (BAG) IMPLANT
COVER PROBE 5X48 (BAG) ×2
PACK CARDIAC CATHETERIZATION (CUSTOM PROCEDURE TRAY) ×1 IMPLANT
SHEATH PINNACLE 6F 10CM (SHEATH) ×1 IMPLANT
SLEEVE REPOSITIONING LENGTH 30 (MISCELLANEOUS) ×1 IMPLANT

## 2016-08-21 NOTE — ED Notes (Signed)
Dr. Hal Hope ( admitting MD ) notified on pt.'s elevated Troponin , Pottasium level and hypoglycemia .

## 2016-08-21 NOTE — ED Notes (Signed)
Pharmacist notified on pt. Calcium Gluconate order.

## 2016-08-21 NOTE — ED Notes (Signed)
Dr. Hal Hope notified on pt.'s elevated Troponin and intermittent bradycardia .

## 2016-08-21 NOTE — ED Notes (Signed)
Dr. Scarlette Calico ( cardiologist) at bedside explaining plan of care to pt. and family.

## 2016-08-21 NOTE — H&P (Signed)
History and Physical    Michelle [name] DOB: 29-Jun-1932 DOA: 08/20/2016  PCP: [name], MD  Patient coming from: Home   History from patients daughter. Patient is demented.  Chief Complaint: Hypoglycemia   HPI: Michelle [name] is a 82 y.o. female with history of DM, dementia, hypothyroidism, and HTN was found to be hypoglycemic with a complete heart block. Patient was recently treated for a UTI with bactrim until yesterday. She started vomiting the night before this. Patient has not been eating well the last few days. Since yesterday morning patient has become more weak and has been having persistent hypoglycemia. EMS was called later in the evening and at that time the patient was found to be bradycardic with a complete heart block. CBG in the 40's.She was started on epinephrine infusion and by the time patient reached the ER, her  heart block resolved and epinephrine was weaned off.  Patients labs show potassium 6, Creatinine was 2.0, and she had a mildly elevated troponin. EKG shows sinus bradycardia.   ED Course: Patient was given a fluid bolus and since patient has been on prednisone with possible adrenal crisis IV hydrocortisone was given. Patient went back into complete heart block transiently but went back to normal after benign given potassium chloride IV and 30g of kayexalate. Cardiology has been consulted twice.   Review of Systems: As per HPI, rest all negative.   Past Medical History:  Diagnosis Date  . Arthritis   . Asthma   . COPD (chronic obstructive pulmonary disease) ([place])   . Coronary atherosclerosis   . Dyslipidemia   . Gallstones   . History of fractured kneecap   . Hypothyroidism   . Interstitial cystitis   . Malaise and fatigue   . Memory loss   . Polymyalgia rheumatica ([place])   . Systemic hypertension   . Type II or unspecified type diabetes mellitus without mention of complication, not stated as uncontrolled   . UTI (lower urinary  tract infection)     Past Surgical History:  Procedure Laterality Date  . CARDIAC CATHETERIZATION  01/29/2006   10% luminal irregularity mid LAD  . CARDIAC CATHETERIZATION  03/15/2001   No significant CAD, no evidence of renal artery stenosis, systemic hypertenion  . CERVICAL SPINE SURGERY    . CHOLECYSTECTOMY    . HERNIA REPAIR    . LUMBAR DISC SURGERY    . NM MYOCAR PERF WALL MOTION  08/17/2009   normal  . ROTATOR CUFF REPAIR    . TONSILLECTOMY    . TOTAL ABDOMINAL HYSTERECTOMY    . US ECHOCARDIOGRAPHY  09/14/2007   mild LVH, LA mildly dilated,mild mitral annular calcification, AOV mildly sclerotic, aortic root sclerosis/ca+     reports that she has never smoked. She has never used smokeless tobacco. She reports that she does not drink alcohol or use drugs.  No Known Allergies  Family History  Problem Relation Age of Onset  . Breast cancer Mother   . Heart disease Father   . Lung cancer Brother     Prior to Admission medications   Medication Sig Start Date End Date Taking? Authorizing Provider  amLODipine (NORVASC) 10 MG tablet Take 1 tablet (10 mg total) by mouth daily. 12/18/15  Yes Susy Frizzle, MD  aspirin 81 MG tablet Take 81 mg by mouth daily.   Yes Historical Provider, MD  Calcium Carbonate-Vit D-Min (CALCIUM 1200 PO) Take 1 tablet by mouth daily.    Yes Historical Provider, MD  Cholecalciferol (VITAMIN D3) 2000 units TABS Take 2,000 Units by mouth daily.    Yes Historical Provider, MD  feeding supplement, ENSURE ENLIVE, (ENSURE ENLIVE) LIQD Take 237 mLs by mouth 2 (two) times daily between meals. Patient taking differently: Take 237 mLs by mouth daily.  04/26/16  Yes Nishant Dhungel, MD  fentaNYL (DURAGESIC - DOSED MCG/HR) 25 MCG/HR patch Place 25 mcg onto the skin every 3 (three) days.  05/26/16  Yes Historical Provider, MD  ferrous sulfate 325 (65 FE) MG tablet Take 325 mg by mouth daily with breakfast.   Yes Historical Provider, MD  hydrochlorothiazide (HYDRODIURIL)  25 MG tablet TAKE ONE TABLET BY MOUTH ONCE DAILY (DISCONTINUE  CLONIDINE) 07/30/16  Yes Susy Frizzle, MD  HYDROcodone-acetaminophen (NORCO/VICODIN) 5-325 MG per tablet Take 1 tablet by mouth every 6 (six) hours as needed for moderate pain.    Yes Historical Provider, MD  insulin lispro (HUMALOG) 100 UNIT/ML injection Inject 0-0.06 mLs (0-6 Units total) into the skin 3 (three) times daily as needed for high blood sugar. Patient uses sliding scale 05/26/16  Yes Susy Frizzle, MD  insulin NPH Human (HUMULIN N,NOVOLIN N) 100 UNIT/ML injection Inject 0.18 mLs (18 Units total) into the skin every morning. Patient taking differently: Inject 5-20 Units into the skin 2 (two) times daily. 20 units in the morning and 5 units in the evening 05/26/16  Yes Susy Frizzle, MD  Insulin Syringe-Needle U-100 31G X 5/16" 1 ML MISC For injection of insulin QID 07/29/16  Yes Susy Frizzle, MD  levothyroxine (SYNTHROID, LEVOTHROID) 100 MCG tablet TAKE 1 TABLET (100 MCG TOTAL) BY MOUTH DAILY. 01/28/16  Yes Susy Frizzle, MD  memantine (NAMENDA) 10 MG tablet TAKE ONE TABLET BY MOUTH TWICE DAILY 02/14/16  Yes Marcial Pacas, MD  Multiple Vitamins-Minerals (OCUVITE PRESERVISION PO) Take 1 capsule by mouth daily.   Yes Historical Provider, MD  ONE TOUCH ULTRA TEST test strip TEST BS 5-6 TIMES A DAY 12/27/15  Yes Susy Frizzle, MD  predniSONE (DELTASONE) 5 MG tablet Take 1 tablet by mouth daily. 04/15/16  Yes Historical Provider, MD  psyllium (HYDROCIL/METAMUCIL) 95 % PACK Take 1 packet by mouth 3 (three) times daily.   Yes Historical Provider, MD  QUEtiapine (SEROQUEL) 25 MG tablet Take 2 tablets (50 mg total) by mouth at bedtime. 12/12/15  Yes Marcial Pacas, MD  telmisartan (MICARDIS) 40 MG tablet Take 1 tablet (40 mg total) by mouth daily. 04/01/16  Yes Susy Frizzle, MD  tiotropium (SPIRIVA HANDIHALER) 18 MCG inhalation capsule Place 1 capsule (18 mcg total) into inhaler and inhale daily. 03/17/16 02/26/18 Yes Clinton D Young, MD   UNABLE TO FIND Take 1 tablet by mouth daily. Med Name: probiotic capsule   Takes one daily.    Yes Historical Provider, MD    Physical Exam: Vitals:   08/20/16 2330 08/21/16 0015 08/21/16 0030 08/21/16 0116  BP: (!) 110/31 (!) 130/101 (!) 107/40 148/72  Pulse: (!) 50 (!) 57 (!) 50 63  Resp:  15 10 16   Temp:      TempSrc:      SpO2: 100% 100% 97% 99%  Weight:      Height:          Vitals:   08/20/16 2330 08/21/16 0015 08/21/16 0030 08/21/16 0116  BP: (!) 110/31 (!) 130/101 (!) 107/40 148/72  Pulse: (!) 50 (!) 57 (!) 50 63  Resp:  15 10 16   Temp:      TempSrc:  SpO2: 100% 100% 97% 99%  Weight:      Height:       Constitutional: Patient is not in distress moderately built.  Eyes: Anicteric, no pallor  ENMT: No discharge from the ears, eyes, nose, and mouth  Neck: No JVD, no neck rigidity Respiratory: CTA, no wheezes or rhonchi Cardiovascular: No murmurs, rubs, or gallops  Abdomen: Soft, non tender, and non distended  Musculoskeletal: good ROM, moves all extremities  Skin: Multiple skin lesions.  Neurological: CN 2-12  intact Psychiatric: Awake, alert, oriented to name only, demented.    Labs on Admission: I have personally reviewed following labs and imaging studies  CBC:  Recent Labs Lab 08/20/16 2350 08/20/16 2359  WBC 6.2  --   NEUTROABS 5.0  --   HGB 8.8* 8.8*  HCT 26.0* 26.0*  MCV 92.5  --   PLT 215  --    Basic Metabolic Panel:  Recent Labs Lab 08/20/16 2350 08/20/16 2359  NA 129* 128*  K 6.0* 6.0*  CL 98* 97*  CO2 22  --   GLUCOSE 30* 28*  BUN 46* 43*  CREATININE 2.06* 2.10*  CALCIUM 8.4*  --    GFR: Estimated Creatinine Clearance: 20.7 mL/min (by C-G formula based on SCr of 2.1 mg/dL (H)). Liver Function Tests: No results for input(s): AST, ALT, ALKPHOS, BILITOT, PROT, ALBUMIN in the last 168 hours. No results for input(s): LIPASE, AMYLASE in the last 168 hours. No results for input(s): AMMONIA in the last 168 hours. Coagulation  Profile: No results for input(s): INR, PROTIME in the last 168 hours. Cardiac Enzymes: No results for input(s): CKTOTAL, CKMB, CKMBINDEX, TROPONINI in the last 168 hours. BNP (last 3 results) No results for input(s): PROBNP in the last 8760 hours. HbA1C: No results for input(s): HGBA1C in the last 72 hours. CBG:  Recent Labs Lab 08/21/16 0100  GLUCAP 97   Lipid Profile: No results for input(s): CHOL, HDL, LDLCALC, TRIG, CHOLHDL, LDLDIRECT in the last 72 hours. Thyroid Function Tests: No results for input(s): TSH, T4TOTAL, FREET4, T3FREE, THYROIDAB in the last 72 hours. Anemia Panel: No results for input(s): VITAMINB12, FOLATE, FERRITIN, TIBC, IRON, RETICCTPCT in the last 72 hours. Urine analysis:    Component Value Date/Time   COLORURINE YELLOW 08/14/2016 1436   APPEARANCEUR CLOUDY (A) 08/14/2016 1436   LABSPEC 1.010 08/14/2016 1436   PHURINE 5.5 08/14/2016 1436   GLUCOSEU NEGATIVE 08/14/2016 1436   HGBUR TRACE (A) 08/14/2016 1436   BILIRUBINUR NEGATIVE 08/14/2016 1436   KETONESUR NEGATIVE 08/14/2016 1436   PROTEINUR 1+ (A) 08/14/2016 1436   UROBILINOGEN 1.0 06/12/2015 1937   NITRITE NEGATIVE 08/14/2016 1436   LEUKOCYTESUR 3+ (A) 08/14/2016 1436   Sepsis Labs: @LABRCNTIP (procalcitonin:4,lacticidven:4) ) Recent Results (from the past 240 hour(s))  Urine culture     Status: None   Collection Time: 08/14/16  3:50 PM  Result Value Ref Range Status   Culture ESCHERICHIA COLI  Final   Colony Count Greater than 100,000 CFU/mL  Final   Organism ID, Bacteria ESCHERICHIA COLI  Final      Susceptibility   Escherichia coli -  (no method available)    AMPICILLIN <=2 Sensitive     AMOX/CLAVULANIC <=2 Sensitive     AMPICILLIN/SULBACTAM <=2 Sensitive     PIP/TAZO <=4 Sensitive     IMIPENEM <=0.25 Sensitive     CEFAZOLIN <=4 Not Reportable     CEFTRIAXONE <=1 Sensitive     CEFTAZIDIME <=1 Sensitive     CEFEPIME <=1 Sensitive  GENTAMICIN <=1 Sensitive     TOBRAMYCIN <=1  Sensitive     CIPROFLOXACIN <=0.25 Sensitive     LEVOFLOXACIN <=0.12 Sensitive     NITROFURANTOIN <=16 Sensitive     TRIMETH/SULFA* <=20 Sensitive      * NR=NOT REPORTABLE,SEE COMMENTORAL therapy:A cefazolin MIC of <32 predicts susceptibility to the oral agents cefaclor,cefdinir,cefpodoxime,cefprozil,cefuroxime,cephalexin,and loracarbef when used for therapy of uncomplicated UTIs due to E.coli,K.pneumomiae,and P.mirabilis. PARENTERAL therapy: A cefazolinMIC of >8 indicates resistance to parenteralcefazolin. An alternate test method must beperformed to confirm susceptibility to parenteralcefazolin.     Radiological Exams on Admission: Dg Chest Portable 1 View  Result Date: 08/21/2016 CLINICAL DATA:  Pulmonary edema EXAM: PORTABLE CHEST 1 VIEW COMPARISON:  03/10/2016 FINDINGS: AP portable semi-erect view of the chest. Central chest obscured by pacer patch. Mildly low lung volumes. Minimal atelectasis at the right CP angle. No acute consolidation. Mild cardiomegaly. Atherosclerosis of the aorta. No pneumothorax. Presumed clips in the right upper quadrant. IMPRESSION: 1. Low lung volumes with minimal atelectasis right CP angle. 2. Borderline to mild cardiomegaly with prominent central pulmonary arteries. 3. Atherosclerotic vascular calcification of the aorta Electronically Signed   By: Donavan Foil M.D.   On: 08/21/2016 00:01    EKG: Independently reviewed. EKG shows sinus bradycardia 58 beats per minute   Assessment/Plan Active Problems:   Hypoglycemia   1. Transient complete heart block - patient initially was in complete heart block when EMS was seeing the patient but improved with epinephrine infusion. Patient again had a transient episode in ER for which improved with Calcium chloride because of the hyperkalemia and heart rhythm improved back to normal sinus rhythm. Cardiology has recommended temporary pacemaker if patient reverts back to heart blocks or bradycardia.   2. Hypoglycemia -  Patient has not being eating well the last few days and also has acute renal failure. Patient also had an episode of nausea and vomiting with recent UTI. All these compounding to possible adrenal crisis. At this time we will hold off patients insulin, last one was given more than 36 hrs ago. Check CBGs every hour. Continue with stress dose steroids. If she becomes hypoglycemic with start D5N  3. Acute renal failure with hyperkalemia - Probably poor intake with dehydration and on top of that patient was recently on bactrim and patiet was also taking ARB. Will check FeNa. Closely follow intake output metabolic panel, continue hydration. Patient had received potassium chloride and 30 grams kayexalate. Hold ARB abd Bactrim. 4. Polymyalgia rheumatica accurrucada  - Patient is on stress dosed steroids. 5. Hypothyroidism- On synthroid. Check TSH  6. Dementia- Will hold of Namenda  in the setting of bradycardia.    DVT prophylaxis: Lovenox  Code Status: Full  Family Communication: Discussed with daughter bedside   Disposition Plan: home once improved  Consults called: Cardiology  Admission status: Inpatient, step-down unit    Laurey Morale, MD  [facility] If 7PM-7AM, please contact night-coverage [email or web address] Password [email or web address]  08/21/2016, 1:32 AM   By signing my name below, I, Collene Leyden, attest that this documentation has been prepared under the direction and in the presence of Gean Birchwood, MD. Electronically signed: Collene Leyden, Scribe. 08/21/16

## 2016-08-21 NOTE — Consult Note (Addendum)
INTERVENTIONAL CARDIOLOGY CONSULT NOTE      Patient ID: Michelle [name] MRN: [id number] DOB/AGE: April 18, 1932 82 y.o.  Admit date: 08/20/2016 Referring PhysicianArshad Aida Puffer, MD Primary 74 [name], MD Primary Cardiologist: Dr. Sallyanne Kuster Reason for Consultation complete heart block  HPI: 82 year old woman with known history of conduction disease. In 2015, a pacemaker was recommended to her. She declined at that time. Today, she was dizzy and near syncopal. Her daughter noted this. They have been noticing slow heart rates intermittently over the past several months. They felt her sugar was low. She did come to the ER and her blood glucose was quite low. She was found to be in complete heart block. Her blood sugar was supplemented. She returned back to normal sinus rhythm intermittently. Her potassium was elevated. After Kayexalate, she had another episode of intermittent low heart rate and the monitor showed complete heart block. She was being transcutaneously paced.  I saw the patient in the ER and she was being transcutaneously paced at 60 bpm. She was uncomfortable and felt a jerking motion from the pacemaker.  With sedation, her discomfort decreased.  Review of systems complete and found to be negative unless listed above   Past Medical History:  Diagnosis Date  . Arthritis   . Asthma   . COPD (chronic obstructive pulmonary disease) ([place])   . Coronary atherosclerosis   . Dyslipidemia   . Gallstones   . History of fractured kneecap   . Hypothyroidism   . Interstitial cystitis   . Malaise and fatigue   . Memory loss   . Polymyalgia rheumatica ([place])   . Systemic hypertension   . Type II or unspecified type diabetes mellitus without mention of complication, not stated as uncontrolled   . UTI (lower urinary tract infection)     Family History  Problem Relation Age of Onset  . Breast cancer Mother   . Heart disease Father   . Lung cancer Brother     Social  History   Social History  . Marital status: Widowed    Spouse name: N/A  . Number of children: 4  . Years of education: 12   Occupational History  . retired Retired   Social History Main Topics  . Smoking status: Never Smoker  . Smokeless tobacco: Never Used  . Alcohol use No  . Drug use: No  . Sexual activity: Not on file   Other Topics Concern  . Not on file   Social History Narrative   Patient lives at home alone. Widowed.   Retired.   Education High school   Right handed   Caffeine- some times coffee and soda one cup.    Past Surgical History:  Procedure Laterality Date  . CARDIAC CATHETERIZATION  01/29/2006   10% luminal irregularity mid LAD  . CARDIAC CATHETERIZATION  03/15/2001   No significant CAD, no evidence of renal artery stenosis, systemic hypertenion  . CERVICAL SPINE SURGERY    . CHOLECYSTECTOMY    . HERNIA REPAIR    . LUMBAR DISC SURGERY    . NM MYOCAR PERF WALL MOTION  08/17/2009   normal  . ROTATOR CUFF REPAIR    . TONSILLECTOMY    . TOTAL ABDOMINAL HYSTERECTOMY    . US ECHOCARDIOGRAPHY  09/14/2007   mild LVH, LA mildly dilated,mild mitral annular calcification, AOV mildly sclerotic, aortic root sclerosis/ca+      (Not in a hospital admission)  Physical Exam: Vitals:   Vitals:   08/21/16 0045 08/21/16 [id number]  08/21/16 0130 08/21/16 0200  BP: (!) 132/111 148/72 (!) 140/52 (!) 152/49  Pulse: (!) 53 63 (!) 49 61  Resp: 15 16 12 14   Temp:      TempSrc:      SpO2: 100% 99% 98% 100%  Weight:      Height:       I&O's:   Intake/Output Summary (Last 24 hours) at 08/21/16 0412 Last data filed at 08/21/16 0203  Gross per 24 hour  Intake             2000 ml  Output                0 ml  Net             2000 ml   Physical exam:  [place]/AT EOMI No JVD,  RRR S1S2  No wheezing Soft. NT, nondistended No edema. No focal motor or sensory deficits flat affect  Labs:   Lab Results  Component Value Date   WBC 6.8 08/21/2016   HGB 8.9 (L)  08/21/2016   HCT 26.4 (L) 08/21/2016   MCV 93.6 08/21/2016   PLT 212 08/21/2016    Recent Labs Lab 08/21/16 0214  NA 127*  K 6.2*  CL 99*  CO2 20*  BUN 44*  CREATININE 1.95*  CALCIUM 9.1  GLUCOSE 167*   Lab Results  Component Value Date   TROPONINI 0.15 (HH) 08/21/2016    Lab Results  Component Value Date   CHOL 189 06/13/2015   Lab Results  Component Value Date   HDL 62 06/13/2015   Lab Results  Component Value Date   LDLCALC 119 (H) 06/13/2015   Lab Results  Component Value Date   TRIG 39 06/13/2015   Lab Results  Component Value Date   CHOLHDL 3.0 06/13/2015   No results found for: LDLDIRECT    Radiology: low lung volumes; atherosclerosis of the aorta EKG: NSR  ASSESSMENT AND PLAN:  Principal Problem:   Complete heart block (HCC) Active Problems:   Polymyalgia rheumatica (HCC)   Hypothyroidism   Acute renal failure superimposed on stage 3 chronic kidney disease (HCC)   HLD (hyperlipidemia)   Alzheimer's dementia   Hypoglycemia   Hypertension  Had a long discussion with the patient's daughters who are the medical decision makers for the patient due to her advanced Alzheimer's disease. Given the need for intermittent transcutaneous pacing, we'll plan on emergent temporary transvenous pacing. The risks and benefits of this procedure were explained to the daughters and they are in agreement. The patient was sedated and likely will not remember the discussion.  Further plans for permanent pacemaking will be deferred to the electrophysiologist. The patient is not on any rate slowing medications. She potentially is a candidate for leadless pacemaker.  Continue therapy for hypoglycemia and hyperkalemia.  I adjusted the temporary pacemaker and wean it off while she is in the ER. She returned back to a heart rate of normal sinus rhythm with a rate in the 60s to 70s. However, it is likely that she will go back to complete heart block.  Critical care time 50  minutes  Signed:   Mina Marble, MD, [facility] 08/21/2016, 4:12 AM

## 2016-08-21 NOTE — Consult Note (Signed)
ELECTROPHYSIOLOGY CONSULT NOTE    Patient ID: Michelle [name] MRN: [id number], DOB/AGE: November 27, 1931 82 y.o.  Admit date: 08/20/2016 Date of Consult: 08/21/2016   Primary Physician: [name], MD Primary Cardiologist: Dr. Sallyanne Kuster Requesting MD: Dr. Irish Lack  Reason for Consultation: profound bradycardia, CHB  HPI: Michelle [name] is a 82 y.o. female with PMHx of advanced dimentia, known hx of conduction system disease, historically had declined PPM, she last saw dr. Loletha Grayer it looks like in 2015, at that time he mentioned: " last year she was referred for significant bradycardia at rest, but her Holter monitor showed normal heart rate response to activity and so the decision was made not to pursue a pacemaker. She has now developed recurrent episodes of dizziness and lightheadedness with near syncope. When she checks her heart rate these episodes are associated with rates of 30-40 beats per minute. She has been unable to take medications for dementia because of the bradycardia"  He felt she had clear evidence of sinus node dysfunction and recommended pacer implant but the patient wanted to discuss with family and does not appear she had f/u since that time.  PMHx also includes DM, HLD, asthma/COPD, and HTN, PMR, Hypothyroidism,   She was admitted to the hospital from home due to 1 day history of severe, constant nausea and vomiting, diarrhea reported to me by the caregiver, several episodes.    The patient's day shift care giver is at bedside and reports they have noted a slow steady decline in her energy, exertional capacity, has slowed in general, needing more frequent re-orientation, she reports her BS at home have been low 40's at times and that since she has hx of recurrent UTI's last week the daughter had her seen and was found with a UTI and treated with an antibiotic she thinks finished 2 days ago.  Yesterday at home she reports her BS was 40, the patient was weak had numerous episodes of  N/V/D.  EMS was called later in the evening and at that time the patient was found to be bradycardic with a complete heart block. CBG in the 40's.She was started on epinephrine infusion and by the time patient reached the ER, her  heart block resolved and epinephrine was weaned off.ED Course: Patient was given a fluid bolus and since patient has been on prednisone with possible adrenal crisis IV hydrocortisone was given. Patient went back into complete heart block transiently but went back to normal after benign given potassium chloride IV and 30g of kayexalate   ADDEND: The family arrived, and after speaking to them, the GI illness was about 2 weeks previous, the day of admission she had one episode of a large amount of vomiting only.  She states that for at least 2 days the patient has had decreasing PO intake and despite less and eventually holding her insulin her BS continued to be low.  She called EMS in hopes of getting glucagon injection for her mother not realizing her heart rate was slow.  She reports her HR generally 40's-50's, occassionally in the 30's and historically her Mom has declined PPM implant.  There are reports of dizzy spells but no syncope. The two daughters at bedside are concerned some of her slowing and acute illness may have been provoked by the infection or just progression of her Alzheimer's.  But up until about 2 weeks ago was fairly interactive and ambulatory with aid at the park and so on, though steadily in the last 2  weeks inactive, more defiant not wanting to do things or interact.  The patient is resting comfortably at this time, wakes, tells me she wants the gloves removed, but nothing much more, denies pain of any kind at this time.  The care giver denies any known syncopal events but a slow steady decline in general especially of late.   LABS: Glucose 28 K+ 6.0 > 6.0 > 6.2 > 5.5 (s/p Keyexalate x2) Na+ 129 > 128 > 127   BUN/Creat 46/2.06 > 43/1.88 H/H 8.8/26.0  WBC  6.8 plts 212 Trop I: 0.15, 0.17 TSH 2.284  2008: Echo EF =/>55%  No nodal blocking agents at home.   Past Medical History:  Diagnosis Date  . Arthritis   . Asthma   . COPD (chronic obstructive pulmonary disease) ([place])   . Coronary atherosclerosis   . Dyslipidemia   . Gallstones   . History of fractured kneecap   . Hypothyroidism   . Interstitial cystitis   . Malaise and fatigue   . Memory loss   . Polymyalgia rheumatica ([place])   . Systemic hypertension   . Type II or unspecified type diabetes mellitus without mention of complication, not stated as uncontrolled   . UTI (lower urinary tract infection)      Surgical History:  Past Surgical History:  Procedure Laterality Date  . CARDIAC CATHETERIZATION  01/29/2006   10% luminal irregularity mid LAD  . CARDIAC CATHETERIZATION  03/15/2001   No significant CAD, no evidence of renal artery stenosis, systemic hypertenion  . CARDIAC CATHETERIZATION N/A 08/21/2016   Procedure: Temporary Pacemaker;  Surgeon: Jettie Booze, MD;  Location: [facility];  Service: Cardiovascular;  Laterality: N/A;  . CERVICAL SPINE SURGERY    . CHOLECYSTECTOMY    . HERNIA REPAIR    . LUMBAR DISC SURGERY    . NM MYOCAR PERF WALL MOTION  08/17/2009   normal  . ROTATOR CUFF REPAIR    . TONSILLECTOMY    . TOTAL ABDOMINAL HYSTERECTOMY    . US ECHOCARDIOGRAPHY  09/14/2007   mild LVH, LA mildly dilated,mild mitral annular calcification, AOV mildly sclerotic, aortic root sclerosis/ca+     Prescriptions Prior to Admission  Medication Sig Dispense Refill Last Dose  . amLODipine (NORVASC) 10 MG tablet Take 1 tablet (10 mg total) by mouth daily. 90 tablet 3 08/20/2016 at Unknown time  . aspirin 81 MG tablet Take 81 mg by mouth daily.   08/20/2016 at Unknown time  . Calcium Carbonate-Vit D-Min (CALCIUM 1200 PO) Take 1 tablet by mouth daily.    08/20/2016 at Unknown time  . Cholecalciferol (VITAMIN D3) 2000 units TABS Take 2,000 Units by mouth daily.     08/20/2016 at Unknown time  . feeding supplement, ENSURE ENLIVE, (ENSURE ENLIVE) LIQD Take 237 mLs by mouth 2 (two) times daily between meals. (Patient taking differently: Take 237 mLs by mouth daily. ) 237 mL 12 08/20/2016 at Unknown time  . fentaNYL (DURAGESIC - DOSED MCG/HR) 25 MCG/HR patch Place 25 mcg onto the skin every 3 (three) days.   0 08/20/2016 at Unknown time  . ferrous sulfate 325 (65 FE) MG tablet Take 325 mg by mouth daily with breakfast.   08/20/2016 at Unknown time  . hydrochlorothiazide (HYDRODIURIL) 25 MG tablet TAKE ONE TABLET BY MOUTH ONCE DAILY (DISCONTINUE  CLONIDINE) 30 tablet 2 08/20/2016 at Unknown time  . HYDROcodone-acetaminophen (NORCO/VICODIN) 5-325 MG per tablet Take 1 tablet by mouth every 6 (six) hours as needed for moderate pain.  unk  . insulin lispro (HUMALOG) 100 UNIT/ML injection Inject 0-0.06 mLs (0-6 Units total) into the skin 3 (three) times daily as needed for high blood sugar. Patient uses sliding scale 10 mL 5 08/20/2016 at Unknown time  . insulin NPH Human (HUMULIN N,NOVOLIN N) 100 UNIT/ML injection Inject 0.18 mLs (18 Units total) into the skin every morning. (Patient taking differently: Inject 5-20 Units into the skin 2 (two) times daily. 20 units in the morning and 5 units in the evening) 10 mL 2 08/18/2016 at am  . Insulin Syringe-Needle U-100 31G X 5/16" 1 ML MISC For injection of insulin QID 100 each 5 08/20/2016 at Unknown time  . levothyroxine (SYNTHROID, LEVOTHROID) 100 MCG tablet TAKE 1 TABLET (100 MCG TOTAL) BY MOUTH DAILY. 90 tablet 1 08/20/2016 at Unknown time  . memantine (NAMENDA) 10 MG tablet TAKE ONE TABLET BY MOUTH TWICE DAILY 180 tablet 4 08/20/2016 at Unknown time  . Multiple Vitamins-Minerals (OCUVITE PRESERVISION PO) Take 1 capsule by mouth daily.   08/20/2016 at Unknown time  . ONE TOUCH ULTRA TEST test strip TEST BS 5-6 TIMES A DAY 200 each 6 08/20/2016 at Unknown time  . predniSONE (DELTASONE) 5 MG tablet Take 1 tablet by mouth  daily.  3 08/20/2016 at Unknown time  . psyllium (HYDROCIL/METAMUCIL) 95 % PACK Take 1 packet by mouth 3 (three) times daily.   08/20/2016 at Unknown time  . QUEtiapine (SEROQUEL) 25 MG tablet Take 2 tablets (50 mg total) by mouth at bedtime. 60 tablet 11 08/20/2016 at Unknown time  . telmisartan (MICARDIS) 40 MG tablet Take 1 tablet (40 mg total) by mouth daily. 90 tablet 3 08/20/2016 at Unknown time  . tiotropium (SPIRIVA HANDIHALER) 18 MCG inhalation capsule Place 1 capsule (18 mcg total) into inhaler and inhale daily. 90 capsule 3 08/20/2016 at Unknown time  . UNABLE TO FIND Take 1 tablet by mouth daily. Med Name: probiotic capsule   Takes one daily.    08/20/2016 at Unknown time    Inpatient Medications:  . aspirin EC  81 mg Oral Daily  . ferrous sulfate  325 mg Oral Q breakfast  . heparin  5,000 Units Subcutaneous Q8H  . hydrocortisone sod succinate (SOLU-CORTEF) inj  50 mg Intravenous Q6H  . sodium polystyrene  15 g Oral Once  . tiotropium  18 mcg Inhalation Daily    Allergies: No Known Allergies  Social History   Social History  . Marital status: Widowed    Spouse name: N/A  . Number of children: 4  . Years of education: 12   Occupational History  . retired Retired   Social History Main Topics  . Smoking status: Never Smoker  . Smokeless tobacco: Never Used  . Alcohol use No  . Drug use: No  . Sexual activity: Not on file   Other Topics Concern  . Not on file   Social History Narrative   Patient lives at home alone. Widowed.   Retired.   Education High school   Right handed   Caffeine- some times coffee and soda one cup.     Family History  Problem Relation Age of Onset  . Breast cancer Mother   . Heart disease Father   . Lung cancer Brother      Review of Systems: All other systems reviewed and are otherwise negative except as noted above.  Physical Exam: Vitals:   08/21/16 0522 08/21/16 0600 08/21/16 0700 08/21/16 0800  BP:  (!) 141/48 (!) 95/38  Marland Kitchen)  97/36  Pulse: (!) 0 74 60 (!) 55  Resp: (!) 0 15 10 13   Temp:  98.2 F (36.8 C)  98.1 F (36.7 C)  TempSrc:  Oral  Axillary  SpO2: (!) 0% 97% 97% 100%  Weight:  137 lb 9.1 oz (62.4 kg)    Height:        GEN- The patient is well appearing, alert and oriented x 3 today.   HEENT: normocephalic, atraumatic; sclera clear, conjunctiva pink; hearing intact; oropharynx clear; neck supple, no JVP Lymph- no cervical lymphadenopathy Lungs- Clear to ausculation bilaterally, normal work of breathing.  No wheezes, rales, rhonchi Heart- Regular rate and rhythm, no murmurs, rubs or gallops, PMI not laterally displaced GI- soft, non-tender, non-distended, bowel sounds present Extremities- no clubbing, cyanosis, or edema; DP/PT/radial pulses 2+ bilaterally MS- no significant deformity or atrophy Skin- warm and dry, no rash or lesion Psych- euthymic mood, full affect Neuro- no gross deficits observed  Labs:   Lab Results  Component Value Date   WBC 6.8 08/21/2016   HGB 8.9 (L) 08/21/2016   HCT 26.4 (L) 08/21/2016   MCV 93.6 08/21/2016   PLT 212 08/21/2016    Recent Labs Lab 08/21/16 0644  NA 127*  K 5.5*  CL 98*  CO2 20*  BUN 43*  CREATININE 1.88*  CALCIUM 9.2  GLUCOSE 245*      Radiology/Studies:  Dg Chest Portable 1 View Result Date: 08/21/2016 CLINICAL DATA:  Pulmonary edema EXAM: PORTABLE CHEST 1 VIEW COMPARISON:  03/10/2016 FINDINGS: AP portable semi-erect view of the chest. Central chest obscured by pacer patch. Mildly low lung volumes. Minimal atelectasis at the right CP angle. No acute consolidation. Mild cardiomegaly. Atherosclerosis of the aorta. No pneumothorax. Presumed clips in the right upper quadrant. IMPRESSION: 1. Low lung volumes with minimal atelectasis right CP angle. 2. Borderline to mild cardiomegaly with prominent central pulmonary arteries. 3. Atherosclerotic vascular calcification of the aorta Electronically Signed   By: Donavan Foil M.D.   On: 08/21/2016  00:01    EKG: #1 SB 58, IVCD #2 CHB, 20bpm TELEMETRY: profound episodes of bradycardia with rates in 20's with intermittent CHB and pauses as long as 8 seconds, though conduction has improved, and in SR currently 70's with brief periods of pacing on occasion.    Assessment and Plan:   1. CHB, prolonged pauses and profound bradycardia requiring external pacing > emergent temp wire/pacing     Conduction sees to be improving with improvement in her hyperkalemia     HR is improved currently, though Ziere Docken pace at 50 briefly on occasion     Historically it appears the patient has been reluctant about PPM implant Craig Wisnewski monitor her HR/rhythm as her electrolytes correct, and have discussion with family as well       2. Electrolyte derangement,  GI illness, poor intake     Hyperkalemia     Hyponatremia     Hypoglycemia  3. ARI  4. Alzheimers (advanced) 5. PMR    Signed, Tommye Standard, PA-C 08/21/2016 8:55 AM   I have seen and examined this patient with Tommye Standard.  Agree with above, note added to reflect my findings.  On exam, regular rhythm, no murmurs, lungs clear. Presented to the hospital in complete AV block requiring temporary pacing wire.  Has been having dizziness and fatigue for the past few months. Found to have acute renal failure, hyperkalemia and hyponatremia.  Possible due to heart block.  Plan to monitor overnight and if continued  CHB, Chrisie Jankovich plan for pacemaker. Risks and benefits explained to the family.  Risks include but not limited to bleeding, infection, tamponade, heart block.   Patient has dementia, but family feels that she would benefit from pacing.  Timara Loma M. Senica Crall MD 08/21/2016 8:47 PM

## 2016-08-21 NOTE — Progress Notes (Addendum)
PROGRESS NOTE    Michelle [name]  LOV:[id number] DOB: 1932-05-09 DOA: 08/20/2016 PCP: [name], MD  Brief Narrative: Michelle [name] is a 82 y.o. female with history of DM, dementia, hypothyroidism, and HTN was found to be hypoglycemic with a complete heart block. Patient was recently treated for a UTI with bactrim until yesterday. She started vomiting the night before this. Patient has not been eating well the last few days. Since yesterday morning patient has become more weak and has been having persistent hypoglycemia. EMS was called later in the evening and at that time the patient was found to be bradycardic with a complete heart block. CBG in the 40's.She was started on epinephrine infusion and by the time patient reached the ER, her  heart block resolved and epinephrine was weaned off.  Patients labs show potassium 6, Creatinine was 2.0, and she had a mildly elevated troponin. EKG shows sinus bradycardia.   ED Course: Patient was given a fluid bolus and since patient has been on prednisone with possible adrenal crisis IV hydrocortisone was given. Patient went back into complete heart block transiently but went back to normal after benign given potassium chloride IV and 30g of kayexalate. Cardiology has been consulted.   Assessment & Plan:   Principal Problem:   Complete heart block (HCC) Active Problems:   Polymyalgia rheumatica (HCC)   Hypothyroidism   Acute renal failure superimposed on stage 3 chronic kidney disease (HCC)   HLD (hyperlipidemia)   Alzheimer's dementia   Hypoglycemia   Hypertension   Transient complete heart block - patient initially was in complete heart block when EMS was seeing the patient but improved with epinephrine infusion. Patient again had a transient episode in ER for which improved with Calcium chloride because of the hyperkalemia and heart rhythm improved back to normal sinus rhythm.  Cardiology has recommended temporary pacemaker, placed  10-12 EP to evaluate patient today.   Hypoglycemia - resolved. Glucose increasing.  Acute renal failure with hyperkalemia - Probably poor intake with dehydration and on top of that patient was recently on bactrim and patiet was also taking ARB. Hold ARB , and bactrim.  IV fluids.  Had urine retention. Place foley   Hyponatremia; continue with IV fluids.   Hyperkalemia; received calcium gluconate, IV lasix.  Will order another dose of kayexalate.  Repeat B-met tonight.   Polymyalgia rheumatica accurrucada  - Patient is on stress dosed steroids.  Hypothyroidism- On synthroid.  TSH ;2.2  Dementia- Will hold of Namenda  in the setting of bradycardia.   Diabetes; presents with hypoglycemia. Blood sugar increasing. Start SSI>     DVT prophylaxis: SCD Code Status: Full code.  Family Communication: Daughter at bedside.  Disposition Plan: remain in the step down unit    Consultants:   Cardiology    Procedures:   Temporary pacemaker.   Antimicrobials:    Subjective: Sleepy, say few words.    Objective: Vitals:   08/21/16 0517 08/21/16 0522 08/21/16 0600 08/21/16 0700  BP: (!) 145/56  (!) 141/48 (!) 95/38  Pulse: (!) 0 (!) 0 74 60  Resp: (!) 52 (!) 0 15 10  Temp:   98.2 F (36.8 C)   TempSrc:   Oral   SpO2: 100% (!) 0% 97% 97%  Weight:   62.4 kg (137 lb 9.1 oz)   Height:        Intake/Output Summary (Last 24 hours) at 08/21/16 0840 Last data filed at 08/21/16 0700  Gross per 24 hour  Intake          2483.33 ml  Output                0 ml  Net          2483.33 ml   Filed Weights   08/20/16 2251 08/21/16 0600  Weight: 77.1 kg (170 lb) 62.4 kg (137 lb 9.1 oz)    Examination:  General exam: Appears calm and comfortable  Respiratory system: Clear to auscultation. Respiratory effort normal. Cardiovascular system: S1 & S2 heard, RRR. No JVD, murmurs, rubs, gallops or clicks. No pedal edema. Gastrointestinal system: Abdomen is nondistended, soft and  nontender. No organomegaly or masses felt. Normal bowel sounds heard. Central nervous system: sleepy.  Extremities: no edema Skin: No rashes, lesions or ulcers Psychiatry: Judgement and insight appear normal. Mood & affect appropriate.     Data Reviewed: I have personally reviewed following labs and imaging studies  CBC:  Recent Labs Lab 08/20/16 2350 08/20/16 2359 08/21/16 0214  WBC 6.2  --  6.8  NEUTROABS 5.0  --   --   HGB 8.8* 8.8* 8.9*  HCT 26.0* 26.0* 26.4*  MCV 92.5  --  93.6  PLT 215  --  924   Basic Metabolic Panel:  Recent Labs Lab 08/20/16 2350 08/20/16 2359 08/21/16 0214 08/21/16 0644  NA 129* 128* 127* 127*  K 6.0* 6.0* 6.2* 5.5*  CL 98* 97* 99* 98*  CO2 22  --  20* 20*  GLUCOSE 30* 28* 167* 245*  BUN 46* 43* 44* 43*  CREATININE 2.06* 2.10* 1.95* 1.88*  CALCIUM 8.4*  --  9.1 9.2   GFR: Estimated Creatinine Clearance: 19.4 mL/min (by C-G formula based on SCr of 1.88 mg/dL (H)). Liver Function Tests: No results for input(s): AST, ALT, ALKPHOS, BILITOT, PROT, ALBUMIN in the last 168 hours. No results for input(s): LIPASE, AMYLASE in the last 168 hours. No results for input(s): AMMONIA in the last 168 hours. Coagulation Profile: No results for input(s): INR, PROTIME in the last 168 hours. Cardiac Enzymes:  Recent Labs Lab 08/21/16 0214 08/21/16 0603  TROPONINI 0.15* 0.17*   BNP (last 3 results) No results for input(s): PROBNP in the last 8760 hours. HbA1C: No results for input(s): HGBA1C in the last 72 hours. CBG:  Recent Labs Lab 08/21/16 0100 08/21/16 0138 08/21/16 0208 08/21/16 0345  GLUCAP 97 85 169* 177*   Lipid Profile: No results for input(s): CHOL, HDL, LDLCALC, TRIG, CHOLHDL, LDLDIRECT in the last 72 hours. Thyroid Function Tests:  Recent Labs  08/21/16 0603  TSH 2.284   Anemia Panel: No results for input(s): VITAMINB12, FOLATE, FERRITIN, TIBC, IRON, RETICCTPCT in the last 72 hours. Sepsis Labs: No results for  input(s): PROCALCITON, LATICACIDVEN in the last 168 hours.  Recent Results (from the past 240 hour(s))  Urine culture     Status: None   Collection Time: 08/14/16  3:50 PM  Result Value Ref Range Status   Culture ESCHERICHIA COLI  Final   Colony Count Greater than 100,000 CFU/mL  Final   Organism ID, Bacteria ESCHERICHIA COLI  Final      Susceptibility   Escherichia coli -  (no method available)    AMPICILLIN <=2 Sensitive     AMOX/CLAVULANIC <=2 Sensitive     AMPICILLIN/SULBACTAM <=2 Sensitive     PIP/TAZO <=4 Sensitive     IMIPENEM <=0.25 Sensitive     CEFAZOLIN <=4 Not Reportable     CEFTRIAXONE <=1 Sensitive  CEFTAZIDIME <=1 Sensitive     CEFEPIME <=1 Sensitive     GENTAMICIN <=1 Sensitive     TOBRAMYCIN <=1 Sensitive     CIPROFLOXACIN <=0.25 Sensitive     LEVOFLOXACIN <=0.12 Sensitive     NITROFURANTOIN <=16 Sensitive     TRIMETH/SULFA* <=20 Sensitive      * NR=NOT REPORTABLE,SEE COMMENTORAL therapy:A cefazolin MIC of <32 predicts susceptibility to the oral agents cefaclor,cefdinir,cefpodoxime,cefprozil,cefuroxime,cephalexin,and loracarbef when used for therapy of uncomplicated UTIs due to E.coli,K.pneumomiae,and P.mirabilis. PARENTERAL therapy: A cefazolinMIC of >8 indicates resistance to parenteralcefazolin. An alternate test method must beperformed to confirm susceptibility to parenteralcefazolin.  MRSA PCR Screening     Status: None   Collection Time: 08/21/16  5:51 AM  Result Value Ref Range Status   MRSA by PCR NEGATIVE NEGATIVE Final    Comment:        The GeneXpert MRSA Assay (FDA approved for NASAL specimens only), is one component of a comprehensive MRSA colonization surveillance program. It is not intended to diagnose MRSA infection nor to guide or monitor treatment for MRSA infections.          Radiology Studies: Dg Chest Portable 1 View  Result Date: 08/21/2016 CLINICAL DATA:  Pulmonary edema EXAM: PORTABLE CHEST 1 VIEW COMPARISON:  03/10/2016  FINDINGS: AP portable semi-erect view of the chest. Central chest obscured by pacer patch. Mildly low lung volumes. Minimal atelectasis at the right CP angle. No acute consolidation. Mild cardiomegaly. Atherosclerosis of the aorta. No pneumothorax. Presumed clips in the right upper quadrant. IMPRESSION: 1. Low lung volumes with minimal atelectasis right CP angle. 2. Borderline to mild cardiomegaly with prominent central pulmonary arteries. 3. Atherosclerotic vascular calcification of the aorta Electronically Signed   By: Donavan Foil M.D.   On: 08/21/2016 00:01        Scheduled Meds: . aspirin EC  81 mg Oral Daily  . ferrous sulfate  325 mg Oral Q breakfast  . heparin  5,000 Units Subcutaneous Q8H  . hydrocortisone sod succinate (SOLU-CORTEF) inj  50 mg Intravenous Q6H  . sodium polystyrene  15 g Oral Once  . tiotropium  18 mcg Inhalation Daily   Continuous Infusions: . sodium chloride 100 mL/hr at 08/21/16 0210     LOS: 0 days    Time spent: 35 minutes.     Elmarie Shiley, MD [facility] Pager [phone]  If 7PM-7AM, please contact night-coverage [email or web address] Password [email or web address] 08/21/2016, 8:40 AM

## 2016-08-21 NOTE — ED Provider Notes (Signed)
I was called back to room for bradycardia HR in the 20s  EKG Interpretation  Date/Time:  Thursday August 21 2016 03:28:16 EDT Ventricular Rate:  20 PR Interval:    QRS Duration: 133 QT Interval:  596 QTC Calculation: 344 R Axis:   -57 Text Interpretation:  2 to 1 AV block Prolonged PR interval Nonspecific IVCD with LAD Anteroseptal infarct, age indeterminate Confirmed by Christy Gentles  MD, [place] ([id number]) on 08/21/2016 3:36:57 AM       Pacing was initiated D/w dr Irish Lack he will see patient Also noted to be continued hyperkalemic Lasix/albuterol/calcium ordered as well to ensure this is not causing bradycardia    Ripley Fraise, MD 08/21/16 [phone]

## 2016-08-21 NOTE — ED Provider Notes (Signed)
I was called to room with resident due to bradycardia Pt was bradycardic (20s) but normal BP and she was awake/alert She was noted to be hyperkalemic and given calcium Her heart rate improved She is awake/alert D/w dr Irish Lack with cardiology Since she is stabilized, continue to monitor glucose and if she has to be TC paced, please call him back Dr Hal Hope with [facility] aware of patient    Ripley Fraise, MD 08/21/16 [phone]

## 2016-08-21 NOTE — Progress Notes (Signed)
I&O cath preformed per hospital protocol via Blase Mess and Jodell Cipro. 1900cc of cloudy yellow turbid urine returned. UA and culture sent off. Pt tolerated procedure well. MD notified. Will continue to monitor.

## 2016-08-21 NOTE — Progress Notes (Signed)
Bladder scanned patient revealing greater than 999cc of urine in bladder. MD notified. Will continue to monitor.

## 2016-08-22 ENCOUNTER — Inpatient Hospital Stay (HOSPITAL_COMMUNITY): Payer: Medicare Other

## 2016-08-22 DIAGNOSIS — R55 Syncope and collapse: Secondary | ICD-10-CM

## 2016-08-22 DIAGNOSIS — G308 Other Alzheimer's disease: Secondary | ICD-10-CM

## 2016-08-22 LAB — IRON AND TIBC
Iron: 81 ug/dL (ref 28–170)
SATURATION RATIOS: 33 % — AB (ref 10.4–31.8)
TIBC: 245 ug/dL — AB (ref 250–450)
UIBC: 164 ug/dL

## 2016-08-22 LAB — ECHOCARDIOGRAM COMPLETE
AO mean calculated velocity dopler: 157 cm/s
AOPV: 0.54 m/s
AOVTI: 58.4 cm
AV Area VTI index: 0.99 cm2/m2
AV Area VTI: 1.54 cm2
AV Area mean vel: 1.6 cm2
AV Mean grad: 11 mmHg
AV Peak grad: 22 mmHg
AV VEL mean LVOT/AV: 0.56
AV area mean vel ind: 0.96 cm2/m2
AV peak Index: 0.93
AVLVOTPG: 6 mmHg
AVPKVEL: 234 cm/s
CHL CUP AV VALUE AREA INDEX: 0.99
CHL CUP AV VEL: 1.65
CHL CUP RV SYS PRESS: 48 mmHg
E decel time: 324 msec
E/e' ratio: 14.29
FS: 30 % (ref 28–44)
HEIGHTINCHES: 63 in
IVS/LV PW RATIO, ED: 0.84
LA diam end sys: 50 mm
LADIAMINDEX: 3.01 cm/m2
LASIZE: 50 mm
LAVOL: 77.6 mL
LAVOLA4C: 69 mL
LAVOLIN: 46.7 mL/m2
LV E/e'average: 14.29
LVEEMED: 14.29
LVELAT: 9.03 cm/s
LVOT SV: 96 mL
LVOT VTI: 33.9 cm
LVOT area: 2.84 cm2
LVOT diameter: 19 mm
LVOT peak VTI: 0.58 cm
LVOTPV: 127 cm/s
Lateral S' vel: 14 cm/s
MV Dec: 324
MV pk A vel: 155 m/s
MV pk E vel: 129 m/s
MVPG: 7 mmHg
PW: 10.7 mm — AB (ref 0.6–1.1)
RV TAPSE: 21.6 mm
Reg peak vel: 334 cm/s
TDI e' lateral: 9.03
TDI e' medial: 5.98
TRMAXVEL: 334 cm/s
Valve area: 1.65 cm2
WEIGHTICAEL: 2229.29 [oz_av]

## 2016-08-22 LAB — FOLATE: Folate: 10.9 ng/mL (ref >5.9)

## 2016-08-22 LAB — CBC
HEMATOCRIT: 22.1 % — AB (ref 36.0–46.0)
HEMOGLOBIN: 7.5 g/dL — AB (ref 12.0–15.0)
MCH: 31.5 pg (ref 26.0–34.0)
MCHC: 33.9 g/dL (ref 30.0–36.0)
MCV: 92.9 fL (ref 78.0–100.0)
Platelets: 188 10*3/uL (ref 150–400)
RBC: 2.38 MIL/uL — AB (ref 3.87–5.11)
RDW: 13.5 % (ref 11.5–15.5)
WBC: 7.4 10*3/uL (ref 4.0–10.5)

## 2016-08-22 LAB — VITAMIN B12: Vitamin B-12: 229 pg/mL (ref 180–914)

## 2016-08-22 LAB — PREPARE RBC (CROSSMATCH)

## 2016-08-22 LAB — BASIC METABOLIC PANEL
Anion gap: 8 (ref 5–15)
BUN: 34 mg/dL — AB (ref 6–20)
CHLORIDE: 101 mmol/L (ref 101–111)
CO2: 21 mmol/L — AB (ref 22–32)
Calcium: 8.5 mg/dL — ABNORMAL LOW (ref 8.9–10.3)
Creatinine, Ser: 1.54 mg/dL — ABNORMAL HIGH (ref 0.44–1.00)
GFR calc Af Amer: 35 mL/min — ABNORMAL LOW (ref >60)
GFR calc non Af Amer: 30 mL/min — ABNORMAL LOW (ref >60)
GLUCOSE: 140 mg/dL — AB (ref 65–99)
POTASSIUM: 4.5 mmol/L (ref 3.5–5.1)
SODIUM: 130 mmol/L — AB (ref 135–145)

## 2016-08-22 LAB — GLUCOSE, CAPILLARY
Glucose-Capillary: 144 mg/dL — ABNORMAL HIGH (ref 65–99)
Glucose-Capillary: 136 mg/dL — ABNORMAL HIGH (ref 65–99)
Glucose-Capillary: 254 mg/dL — ABNORMAL HIGH (ref 65–99)
Glucose-Capillary: 264 mg/dL — ABNORMAL HIGH (ref 65–99)

## 2016-08-22 LAB — RETICULOCYTES
RBC.: 2.39 MIL/uL — ABNORMAL LOW (ref 3.87–5.11)
RETIC CT PCT: 1.3 % (ref 0.4–3.1)
Retic Count, Absolute: 31.1 10*3/uL (ref 19.0–186.0)

## 2016-08-22 LAB — FERRITIN: FERRITIN: 136 ng/mL (ref 11–307)

## 2016-08-22 MED ORDER — LEVOTHYROXINE SODIUM 100 MCG PO TABS
100.0000 ug | ORAL_TABLET | Freq: Every day | ORAL | Status: DC
  Administered 2016-08-23: 100 ug via ORAL
  Filled 2016-08-22 (×2): qty 1

## 2016-08-22 MED ORDER — HYDROCORTISONE NA SUCCINATE PF 100 MG IJ SOLR
50.0000 mg | Freq: Two times a day (BID) | INTRAMUSCULAR | Status: DC
  Administered 2016-08-22 – 2016-08-23 (×2): 50 mg via INTRAVENOUS
  Filled 2016-08-22 (×2): qty 2

## 2016-08-22 MED ORDER — SODIUM CHLORIDE 0.9 % IV SOLN
Freq: Once | INTRAVENOUS | Status: AC
Start: 2016-08-22 — End: 2016-08-22
  Administered 2016-08-22: 09:00:00 via INTRAVENOUS

## 2016-08-22 MED ORDER — WHITE PETROLATUM GEL
Status: AC
  Administered 2016-08-22: 1
  Filled 2016-08-22: qty 1

## 2016-08-22 NOTE — Progress Notes (Signed)
PROGRESS NOTE    Michelle [name]  [id number] DOB: 01-29-1932 DOA: 08/20/2016 PCP: [name], MD  Brief Narrative: Michelle [name] is a 82 y.o. female with history of DM, dementia, hypothyroidism, and HTN was found to be hypoglycemic with a complete heart block. Patient was recently treated for a UTI with bactrim until yesterday. She started vomiting the night before this. Patient has not been eating well the last few days. Since yesterday morning patient has become more weak and has been having persistent hypoglycemia. EMS was called later in the evening and at that time the patient was found to be bradycardic with a complete heart block. CBG in the 40's.She was started on epinephrine infusion and by the time patient reached the ER, her  heart block resolved and epinephrine was weaned off.  Patients labs show potassium 6, Creatinine was 2.0, and she had a mildly elevated troponin. EKG shows sinus bradycardia.   ED Course: Patient was given a fluid bolus and since patient has been on prednisone with possible adrenal crisis IV hydrocortisone was given. Patient went back into complete heart block transiently but went back to normal after benign given potassium chloride IV and 30g of kayexalate. Cardiology has been consulted.   Assessment & Plan:   Principal Problem:   Complete heart block (HCC) Active Problems:   Polymyalgia rheumatica (HCC)   Hypothyroidism   Acute renal failure superimposed on stage 3 chronic kidney disease (HCC)   HLD (hyperlipidemia)   Alzheimer's dementia   Hypoglycemia   Hypertension   Transient complete heart block - patient initially was in complete heart block when EMS was seeing the patient but improved with epinephrine infusion. Patient again had a transient episode in ER for which improved with Calcium chloride because of the hyperkalemia and heart rhythm improved back to normal sinus rhythm.  Cardiology has recommended temporary pacemaker, placed  10-12 Electrolytes abnormalities corrected.  Plan to turned off pacer later today, and  ambulate patient to see  how she does.   Hypoglycemia - resolved. Glucose increasing.  Acute renal failure with hyperkalemia - Probably poor intake with dehydration and on top of that patient was recently on bactrim and patiet was also taking ARB. And urine retention.  Hold ARB , and bactrim.  IV fluids.  Had urine retention. Place foley  Improving.  Follow urine culture.   Hyponatremia; continue with IV fluids. Improving.   Hyperkalemia; received calcium gluconate, IV lasix.  Received  f kayexalate.  Resolved.   Polymyalgia rheumatica accurrucada  - Patient is on stress dosed steroids. Taper hydrocortisone to BID   Hypothyroidism- On synthroid.  TSH ;2.2  Dementia- Will hold of Namenda  in the setting of bradycardia.   Diabetes; presents with hypoglycemia. Blood sugar increasing. Start SSI>    Anemia; Check anemia panel. Transfuse one unit PRBC>   DVT prophylaxis: SCD Code Status: Full code.  Family Communication: Daughter at bedside.  Disposition Plan: remain in the step down unit    Consultants:   Cardiology    Procedures:   Temporary pacemaker.   Antimicrobials:    Subjective: More alert today, relates feeling well, pleasantly confuse..    Objective: Vitals:   08/22/16 0500 08/22/16 0600 08/22/16 0700 08/22/16 0800  BP: (!) 141/65 (!) 142/58 123/73   Pulse: (!) 46 66 71   Resp: 12 15 18    Temp:      TempSrc:      SpO2: (!) 86% 97% 97% 97%  Weight:  Height:        Intake/Output Summary (Last 24 hours) at 08/22/16 0814 Last data filed at 08/22/16 0704  Gross per 24 hour  Intake             2660 ml  Output             4460 ml  Net            -1800 ml   Filed Weights   08/20/16 2251 08/21/16 0600 08/22/16 0405  Weight: 77.1 kg (170 lb) 62.4 kg (137 lb 9.1 oz) 63.2 kg (139 lb 5.3 oz)    Examination:  General exam: Appears calm and comfortable    Respiratory system: Clear to auscultation. Respiratory effort normal. Cardiovascular system: S1 & S2 heard, RRR. No JVD, murmurs, rubs, gallops or clicks. No pedal edema. Gastrointestinal system: Abdomen is nondistended, soft and nontender. No organomegaly or masses felt. Normal bowel sounds heard. Central nervous system: sleepy.  Extremities: no edema Skin: No rashes, lesions or ulcers Psychiatry: Judgement and insight appear normal. Mood & affect appropriate.     Data Reviewed: I have personally reviewed following labs and imaging studies  CBC:  Recent Labs Lab 08/20/16 2350 08/20/16 2359 08/21/16 0214 08/22/16 0459  WBC 6.2  --  6.8 7.4  NEUTROABS 5.0  --   --   --   HGB 8.8* 8.8* 8.9* 7.5*  HCT 26.0* 26.0* 26.4* 22.1*  MCV 92.5  --  93.6 92.9  PLT 215  --  212 0000000   Basic Metabolic Panel:  Recent Labs Lab 08/20/16 2350 08/20/16 2359 08/21/16 0214 08/21/16 0644 08/21/16 1903 08/22/16 0459  NA 129* 128* 127* 127* 128* 130*  K 6.0* 6.0* 6.2* 5.5* 4.8 4.5  CL 98* 97* 99* 98* 99* 101  CO2 22  --  20* 20* 21* 21*  GLUCOSE 30* 28* 167* 245* 151* 140*  BUN 46* 43* 44* 43* 39* 34*  CREATININE 2.06* 2.10* 1.95* 1.88* 1.81* 1.54*  CALCIUM 8.4*  --  9.1 9.2 8.7* 8.5*   GFR: Estimated Creatinine Clearance: 25.6 mL/min (by C-G formula based on SCr of 1.54 mg/dL (H)). Liver Function Tests: No results for input(s): AST, ALT, ALKPHOS, BILITOT, PROT, ALBUMIN in the last 168 hours. No results for input(s): LIPASE, AMYLASE in the last 168 hours. No results for input(s): AMMONIA in the last 168 hours. Coagulation Profile: No results for input(s): INR, PROTIME in the last 168 hours. Cardiac Enzymes:  Recent Labs Lab 08/21/16 0214 08/21/16 0603 08/21/16 1357  TROPONINI 0.15* 0.17* 0.20*   BNP (last 3 results) No results for input(s): PROBNP in the last 8760 hours. HbA1C: No results for input(s): HGBA1C in the last 72 hours. CBG:  Recent Labs Lab 08/21/16 0345  08/21/16 0845 08/21/16 1210 08/21/16 1646 08/21/16 2132  GLUCAP 177* 292* 295* 210* 131*   Lipid Profile: No results for input(s): CHOL, HDL, LDLCALC, TRIG, CHOLHDL, LDLDIRECT in the last 72 hours. Thyroid Function Tests:  Recent Labs  08/21/16 0603  TSH 2.284   Anemia Panel: No results for input(s): VITAMINB12, FOLATE, FERRITIN, TIBC, IRON, RETICCTPCT in the last 72 hours. Sepsis Labs: No results for input(s): PROCALCITON, LATICACIDVEN in the last 168 hours.  Recent Results (from the past 240 hour(s))  Urine culture     Status: None   Collection Time: 08/14/16  3:50 PM  Result Value Ref Range Status   Culture ESCHERICHIA COLI  Final   Colony Count Greater than 100,000 CFU/mL  Final  Organism ID, Bacteria ESCHERICHIA COLI  Final      Susceptibility   Escherichia coli -  (no method available)    AMPICILLIN <=2 Sensitive     AMOX/CLAVULANIC <=2 Sensitive     AMPICILLIN/SULBACTAM <=2 Sensitive     PIP/TAZO <=4 Sensitive     IMIPENEM <=0.25 Sensitive     CEFAZOLIN <=4 Not Reportable     CEFTRIAXONE <=1 Sensitive     CEFTAZIDIME <=1 Sensitive     CEFEPIME <=1 Sensitive     GENTAMICIN <=1 Sensitive     TOBRAMYCIN <=1 Sensitive     CIPROFLOXACIN <=0.25 Sensitive     LEVOFLOXACIN <=0.12 Sensitive     NITROFURANTOIN <=16 Sensitive     TRIMETH/SULFA* <=20 Sensitive      * NR=NOT REPORTABLE,SEE COMMENTORAL therapy:A cefazolin MIC of <32 predicts susceptibility to the oral agents cefaclor,cefdinir,cefpodoxime,cefprozil,cefuroxime,cephalexin,and loracarbef when used for therapy of uncomplicated UTIs due to E.coli,K.pneumomiae,and P.mirabilis. PARENTERAL therapy: A cefazolinMIC of >8 indicates resistance to parenteralcefazolin. An alternate test method must beperformed to confirm susceptibility to parenteralcefazolin.  MRSA PCR Screening     Status: None   Collection Time: 08/21/16  5:51 AM  Result Value Ref Range Status   MRSA by PCR NEGATIVE NEGATIVE Final    Comment:          The GeneXpert MRSA Assay (FDA approved for NASAL specimens only), is one component of a comprehensive MRSA colonization surveillance program. It is not intended to diagnose MRSA infection nor to guide or monitor treatment for MRSA infections.          Radiology Studies: Dg Chest Portable 1 View  Result Date: 08/21/2016 CLINICAL DATA:  Pulmonary edema EXAM: PORTABLE CHEST 1 VIEW COMPARISON:  03/10/2016 FINDINGS: AP portable semi-erect view of the chest. Central chest obscured by pacer patch. Mildly low lung volumes. Minimal atelectasis at the right CP angle. No acute consolidation. Mild cardiomegaly. Atherosclerosis of the aorta. No pneumothorax. Presumed clips in the right upper quadrant. IMPRESSION: 1. Low lung volumes with minimal atelectasis right CP angle. 2. Borderline to mild cardiomegaly with prominent central pulmonary arteries. 3. Atherosclerotic vascular calcification of the aorta Electronically Signed   By: Donavan Foil M.D.   On: 08/21/2016 00:01        Scheduled Meds: . aspirin EC  81 mg Oral Daily  . ferrous sulfate  325 mg Oral Q breakfast  . hydrocortisone sod succinate (SOLU-CORTEF) inj  50 mg Intravenous Q6H  . insulin aspart  0-9 Units Subcutaneous TID WC  . tiotropium  18 mcg Inhalation Daily   Continuous Infusions: . sodium chloride 100 mL/hr at 08/21/16 0927     LOS: 1 day    Time spent: 35 minutes.     Elmarie Shiley, MD [facility] Pager [phone]  If 7PM-7AM, please contact night-coverage [email or web address] Password [email or web address] 08/22/2016, 8:14 AM

## 2016-08-22 NOTE — Progress Notes (Signed)
Pacer turned off and Temp pacer removed from sheath in right IJ.

## 2016-08-22 NOTE — Progress Notes (Signed)
  Echocardiogram 2D Echocardiogram has been performed.  Johny Chess 08/22/2016, 10:57 AM

## 2016-08-22 NOTE — Progress Notes (Signed)
Pt was bladder scanned which reported greater than 700cc of urin. Cardiology called and order given to place foley. Upon placement 1300cc drained from bladder. Will continue to monitor.

## 2016-08-22 NOTE — Progress Notes (Addendum)
Patient Name: Michelle [name]      SUBJECTIVE: confused and without complaints Spoke with daughter  Past Medical History:  Diagnosis Date  . Arthritis   . Asthma   . COPD (chronic obstructive pulmonary disease) ([place])   . Coronary atherosclerosis   . Dyslipidemia   . Gallstones   . History of fractured kneecap   . Hypothyroidism   . Interstitial cystitis   . Malaise and fatigue   . Memory loss   . Polymyalgia rheumatica ([place])   . Systemic hypertension   . Type II or unspecified type diabetes mellitus without mention of complication, not stated as uncontrolled   . UTI (lower urinary tract infection)     Scheduled Meds:  Scheduled Meds: . aspirin EC  81 mg Oral Daily  . ferrous sulfate  325 mg Oral Q breakfast  . hydrocortisone sod succinate (SOLU-CORTEF) inj  50 mg Intravenous Q6H  . insulin aspart  0-9 Units Subcutaneous TID WC  . tiotropium  18 mcg Inhalation Daily   Continuous Infusions: . sodium chloride 100 mL/hr at 08/21/16 0927   acetaminophen **OR** acetaminophen, hydrALAZINE, ondansetron **OR** ondansetron (ZOFRAN) IV    PHYSICAL EXAM Vitals:   08/22/16 0600 08/22/16 0700 08/22/16 0800 08/22/16 0825  BP: (!) 142/58 123/73  117/71  Pulse: 66 71  68  Resp: 15 18  14   Temp:    99 F (37.2 C)  TempSrc:    Oral  SpO2: 97% 97% 97% 100%  Weight:      Height:        Well developed and nourished in no acute distress disoriented and confused HENT normal Neck supple  Clear laterally  Regular rate and rhythm, Abd-soft with active BS No Clubbing cyanosis edema Skin-warm and dry A   Grossly normal sensory and motor function   TELEMETRY: Reviewed telemetry pt in sinus at this point   Intake/Output Summary (Last 24 hours) at 08/22/16 0831 Last data filed at 08/22/16 0704  Gross per 24 hour  Intake             2660 ml  Output             4460 ml  Net            -1800 ml    LABS: Basic Metabolic Panel:  Recent Labs Lab 08/20/16 2350  08/20/16 2359 08/21/16 0214 08/21/16 0644 08/21/16 1903 08/22/16 0459  NA 129* 128* 127* 127* 128* 130*  K 6.0* 6.0* 6.2* 5.5* 4.8 4.5  CL 98* 97* 99* 98* 99* 101  CO2 22  --  20* 20* 21* 21*  GLUCOSE 30* 28* 167* 245* 151* 140*  BUN 46* 43* 44* 43* 39* 34*  CREATININE 2.06* 2.10* 1.95* 1.88* 1.81* 1.54*  CALCIUM 8.4*  --  9.1 9.2 8.7* 8.5*   Cardiac Enzymes:  Recent Labs  08/21/16 0214 08/21/16 0603 08/21/16 1357  TROPONINI 0.15* 0.17* 0.20*   CBC:  Recent Labs Lab 08/20/16 2350 08/20/16 2359 08/21/16 0214 08/22/16 0459  WBC 6.2  --  6.8 7.4  NEUTROABS 5.0  --   --   --   HGB 8.8* 8.8* 8.9* 7.5*  HCT 26.0* 26.0* 26.4* 22.1*  MCV 92.5  --  93.6 92.9  PLT 215  --  212 188   PROTIME: No results for input(s): LABPROT, INR in the last 72 hours. Liver Function Tests: No results for input(s): AST, ALT, ALKPHOS, BILITOT, PROT, ALBUMIN in the last  72 hours. No results for input(s): LIPASE, AMYLASE in the last 72 hours. BNP: BNP (last 3 results) No results for input(s): BNP in the last 8760 hours.  ProBNP (last 3 results) No results for input(s): PROBNP in the last 8760 hours.  D-Dimer: No results for input(s): DDIMER in the last 72 hours. Hemoglobin A1C: No results for input(s): HGBA1C in the last 72 hours. Fasting Lipid Panel: No results for input(s): CHOL, HDL, LDLCALC, TRIG, CHOLHDL, LDLDIRECT in the last 72 hours. Thyroid Function Tests:  Recent Labs  08/21/16 0603  TSH 2.284     ASSESSMENT AND PLAN:  Principal Problem:   Complete heart block (HCC) Active Problems:   Acute renal failure superimposed on stage 3 chronic kidney disease (HCC)   Alzheimer's dementia   Hypoglycemia   Hypertension   Hyperkalemia Anemia chronic but now worse  Hyperkalemia has now resolved    The pts rhythm has stablized concurrent with metabolic normalization I have reviewed this with the pts daughter and we will hold off on pacing at this point.  The temp has been  turned down to 40 If no further pacing later today will remove and then ambulate The family is concerned thyat maybe her QOL deterioration related to poor exercise tolerance may be abated by pacing  I am not sanguine that this is so, and have told the daughter that.  We will walk her though and make sure there is no decrease in her HR ( ie infra hisian block)  Spoke with PC and she will get transfusion this am  I also spoke with daughter about the ethical dilemma of proceeding with pacing at this time when she in her senses a few years ago declined pacing.  She and her sister would do it if they thought it would improve her QOL but are ok for not proceeding unless situation dictates   Signed, Virl Axe MD  08/22/2016

## 2016-08-23 ENCOUNTER — Inpatient Hospital Stay (HOSPITAL_COMMUNITY): Payer: Medicare Other

## 2016-08-23 LAB — TYPE AND SCREEN
ABO/RH(D): O POS
Antibody Screen: NEGATIVE
UNIT DIVISION: 0

## 2016-08-23 LAB — CBC
HCT: 28.3 % — ABNORMAL LOW (ref 36.0–46.0)
Hemoglobin: 9.6 g/dL — ABNORMAL LOW (ref 12.0–15.0)
MCH: 30.8 pg (ref 26.0–34.0)
MCHC: 33.9 g/dL (ref 30.0–36.0)
MCV: 90.7 fL (ref 78.0–100.0)
PLATELETS: 197 10*3/uL (ref 150–400)
RBC: 3.12 MIL/uL — ABNORMAL LOW (ref 3.87–5.11)
RDW: 14.9 % (ref 11.5–15.5)
WBC: 11.3 10*3/uL — ABNORMAL HIGH (ref 4.0–10.5)

## 2016-08-23 LAB — GLUCOSE, CAPILLARY
GLUCOSE-CAPILLARY: 188 mg/dL — AB (ref 65–99)
GLUCOSE-CAPILLARY: 147 mg/dL — AB (ref 65–99)
Glucose-Capillary: 314 mg/dL — ABNORMAL HIGH (ref 65–99)
Glucose-Capillary: 133 mg/dL — ABNORMAL HIGH (ref 65–99)

## 2016-08-23 LAB — BASIC METABOLIC PANEL
Anion gap: 12 (ref 5–15)
BUN: 30 mg/dL — AB (ref 6–20)
CO2: 17 mmol/L — ABNORMAL LOW (ref 22–32)
CREATININE: 1.3 mg/dL — AB (ref 0.44–1.00)
Calcium: 8.4 mg/dL — ABNORMAL LOW (ref 8.9–10.3)
Chloride: 100 mmol/L — ABNORMAL LOW (ref 101–111)
GFR calc Af Amer: 43 mL/min — ABNORMAL LOW (ref >60)
GFR, EST NON AFRICAN AMERICAN: 37 mL/min — AB (ref >60)
GLUCOSE: 326 mg/dL — AB (ref 65–99)
Potassium: 4.1 mmol/L (ref 3.5–5.1)
SODIUM: 129 mmol/L — AB (ref 135–145)

## 2016-08-23 LAB — URINE CULTURE: Culture: >=100000 — AB

## 2016-08-23 MED ORDER — TAMSULOSIN HCL 0.4 MG PO CAPS
0.4000 mg | ORAL_CAPSULE | Freq: Every day | ORAL | Status: DC
  Administered 2016-08-23 – 2016-08-25 (×2): 0.4 mg via ORAL
  Filled 2016-08-23 (×3): qty 1

## 2016-08-23 MED ORDER — FUROSEMIDE 10 MG/ML IJ SOLN
INTRAMUSCULAR | Status: AC
  Filled 2016-08-23: qty 4

## 2016-08-23 MED ORDER — FUROSEMIDE 10 MG/ML IJ SOLN
40.0000 mg | Freq: Once | INTRAMUSCULAR | Status: AC
  Administered 2016-08-23: 40 mg via INTRAVENOUS

## 2016-08-23 MED ORDER — INSULIN GLARGINE 100 UNIT/ML ~~LOC~~ SOLN
10.0000 [IU] | Freq: Every day | SUBCUTANEOUS | Status: DC
  Administered 2016-08-23 – 2016-08-25 (×2): 10 [IU] via SUBCUTANEOUS
  Filled 2016-08-23 (×3): qty 0.1

## 2016-08-23 MED ORDER — FENTANYL 25 MCG/HR TD PT72
25.0000 ug | MEDICATED_PATCH | TRANSDERMAL | Status: DC
  Administered 2016-08-23: 25 ug via TRANSDERMAL
  Filled 2016-08-23: qty 1

## 2016-08-23 MED ORDER — DEXTROSE 5 % IV SOLN
1.0000 g | INTRAVENOUS | Status: DC
  Administered 2016-08-23 – 2016-08-25 (×3): 1 g via INTRAVENOUS
  Filled 2016-08-23 (×4): qty 10

## 2016-08-23 MED ORDER — VITAMIN B-12 100 MCG PO TABS
100.0000 ug | ORAL_TABLET | Freq: Every day | ORAL | Status: DC
  Administered 2016-08-23 – 2016-08-25 (×2): 100 ug via ORAL
  Filled 2016-08-23 (×3): qty 1

## 2016-08-23 MED ORDER — PREDNISONE 10 MG PO TABS
5.0000 mg | ORAL_TABLET | Freq: Every day | ORAL | Status: DC
  Administered 2016-08-23: 5 mg via ORAL
  Filled 2016-08-23 (×2): qty 1

## 2016-08-23 MED ORDER — SODIUM BICARBONATE 650 MG PO TABS
1300.0000 mg | ORAL_TABLET | Freq: Two times a day (BID) | ORAL | Status: DC
  Administered 2016-08-23 – 2016-08-25 (×4): 1300 mg via ORAL
  Filled 2016-08-23 (×6): qty 2

## 2016-08-23 NOTE — Progress Notes (Signed)
SUBJECTIVE: The patient is more SOB today.  Continues to have confusion.  AV conduction appears to have normalized.  Marland Kitchen aspirin EC  81 mg Oral Daily  . ferrous sulfate  325 mg Oral Q breakfast  . insulin aspart  0-9 Units Subcutaneous TID WC  . insulin glargine  10 Units Subcutaneous Daily  . levothyroxine  100 mcg Oral QAC breakfast  . predniSONE  5 mg Oral Q breakfast  . sodium bicarbonate  1,300 mg Oral BID  . tamsulosin  0.4 mg Oral QPC breakfast  . tiotropium  18 mcg Inhalation Daily      OBJECTIVE: Physical Exam: Vitals:   08/23/16 0502 08/23/16 0600 08/23/16 0741 08/23/16 0753  BP: (!) 152/83 (!) 159/82 118/66   Pulse:   83   Resp: 17 17 20    Temp:   98.3 F (36.8 C)   TempSrc:   Oral   SpO2: 99%   98%  Weight:      Height:        Intake/Output Summary (Last 24 hours) at 08/23/16 0943 Last data filed at 08/23/16 0900  Gross per 24 hour  Intake             2870 ml  Output             2150 ml  Net              720 ml    Telemetry reveals sinus rhythm  1:1 AV conduction  GEN- The patient is elderly appearing, alert but confused Head- normocephalic, atraumatic Eyes-  Sclera clear, conjunctiva pink Ears- hearing intact Oropharynx- clear Neck- supple,  Elevated JVP Lungs- bibasilar rales, normal work of breathing Heart- Regular rate and rhythm  GI- soft, NT, ND, + BS Extremities- no clubbing, cyanosis, or edema Skin- no rash or lesion Psych- euthymic mood, full affect MS- thin and frail appearing  LABS: Basic Metabolic Panel:  Recent Labs  08/22/16 0459 08/23/16 0146  NA 130* 129*  K 4.5 4.1  CL 101 100*  CO2 21* 17*  GLUCOSE 140* 326*  BUN 34* 30*  CREATININE 1.54* 1.30*  CALCIUM 8.5* 8.4*   CBC:  Recent Labs  08/20/16 2350  08/22/16 0459 08/23/16 0146  WBC 6.2  < > 7.4 11.3*  NEUTROABS 5.0  --   --   --   HGB 8.8*  < > 7.5* 9.6*  HCT 26.0*  < > 22.1* 28.3*  MCV 92.5  < > 92.9 90.7  PLT 215  < > 188 197  < > = values in this  interval not displayed. Cardiac Enzymes:  Recent Labs  08/21/16 0214 08/21/16 0603 08/21/16 1357  TROPONINI 0.15* 0.17* 0.20*   Thyroid Function Tests:  Recent Labs  08/21/16 0603  TSH 2.284   Anemia Panel:  Recent Labs  08/22/16 0928  VITAMINB12 229  FOLATE 10.9  FERRITIN 136  TIBC 245*  IRON 81  RETICCTPCT 1.3    ASSESSMENT AND PLAN:  Principal Problem:   Complete heart block (HCC) Active Problems:   Acute renal failure superimposed on stage 3 chronic kidney disease (HCC)   Alzheimer's dementia   Hypoglycemia   Hypertension   Hyperkalemia  1. SOB Mild volume overload on exam Would advise gently diuresis Up to chair Could consider IS  2. AV block Currently resolved Per Dr Caryl Comes, hoping to avoid ppm  3. Reduced EF Gentle diuresis Caution with IVF  4. Dementia Confusion is exacerbated by current condition and ICU  setting Would avoid dementia agents which can cause bradycardia.  Up to chair PT consult  Thompson Grayer, MD 08/23/2016 9:43 AM

## 2016-08-23 NOTE — Progress Notes (Signed)
PROGRESS NOTE    Michelle [name]  [id number] DOB: 1932-04-02 DOA: 08/20/2016 PCP: [name], MD  Brief Narrative: Michelle [name] is a 82 y.o. female with history of DM, dementia, hypothyroidism, and HTN was found to be hypoglycemic with a complete heart block. Patient was recently treated for a UTI with bactrim until yesterday. She started vomiting the night before this. Patient has not been eating well the last few days. Since yesterday morning patient has become more weak and has been having persistent hypoglycemia. EMS was called later in the evening and at that time the patient was found to be bradycardic with a complete heart block. CBG in the 40's.She was started on epinephrine infusion and by the time patient reached the ER, her  heart block resolved and epinephrine was weaned off.  Patients labs show potassium 6, Creatinine was 2.0, and she had a mildly elevated troponin. EKG shows sinus bradycardia.   ED Course: Patient was given a fluid bolus and since patient has been on prednisone with possible adrenal crisis IV hydrocortisone was given. Patient went back into complete heart block transiently but went back to normal after benign given potassium chloride IV and 30g of kayexalate. Cardiology has been consulted.   Assessment & Plan:   Principal Problem:   Complete heart block (HCC) Active Problems:   Acute renal failure superimposed on stage 3 chronic kidney disease (HCC)   Alzheimer's dementia   Hypoglycemia   Hypertension   Hyperkalemia   Transient complete heart block - patient initially was in complete heart block when EMS was seeing the patient but improved with epinephrine infusion. Patient again had a transient episode in ER for which improved with Calcium chloride because of the hyperkalemia and heart rhythm improved back to normal sinus rhythm.  Cardiology has recommended temporary pacemaker, placed 10-12 Electrolytes abnormalities corrected.  AV conduction  appears to have normalized.   Hypoglycemia - resolved. Glucose increasing.  Acute renal failure with hyperkalemia - Probably poor intake with dehydration and on top of that patient was recently on bactrim and patiet was also taking ARB. And urine retention.  Hold ARB , and bactrim.  Had urine retention. Place foley  Improving.  Stop IV fluids.   SOB;  Check Chest x ray.  IV lasix Incentive spirometry/   UTI; urine growing enterococcus.  Sensitive to ceftriaxone.   Hyponatremia; continue with IV fluids. Improving.   Hyperkalemia; received calcium gluconate, IV lasix.  Received  f kayexalate.  Resolved.   Polymyalgia rheumatica accurrucada  - Patient is on stress dosed steroids. Taper hydrocortisone to BID   Hypothyroidism- On synthroid.  TSH ;2.2  Dementia- delirium , in setting of infection, Hospitalization.  Will hold of Namenda  in the setting of bradycardia. Will continue to hold Seroquel due to the same.   Diabetes; presents with hypoglycemia. Blood sugar increasing. Start SSI>    Anemia; Check anemia panel. Transfuse one unit PRBC>   DVT prophylaxis: SCD Code Status: Full code.  Family Communication: Daughter at bedside.  Disposition Plan: remain in the step down unit    Consultants:   Cardiology    Procedures:   Temporary pacemaker.   Antimicrobials:    Subjective: More alert today, relates feeling well, pleasantly confuse..    Objective: Vitals:   08/23/16 0502 08/23/16 0600 08/23/16 0741 08/23/16 0753  BP: (!) 152/83 (!) 159/82 118/66   Pulse:   83   Resp: 17 17 20    Temp:   98.3 F (36.8 C)  TempSrc:   Oral   SpO2: 99%   98%  Weight:      Height:        Intake/Output Summary (Last 24 hours) at 08/23/16 0758 Last data filed at 08/23/16 0600  Gross per 24 hour  Intake             2885 ml  Output             2150 ml  Net              735 ml   Filed Weights   08/21/16 0600 08/22/16 0405 08/23/16 0337  Weight: 62.4 kg (137 lb 9.1  oz) 63.2 kg (139 lb 5.3 oz) 62.3 kg (137 lb 5.6 oz)    Examination:  General exam: Appears calm and comfortable  Respiratory system: Clear to auscultation. Respiratory effort normal. Cardiovascular system: S1 & S2 heard, RRR. No JVD, murmurs, rubs, gallops or clicks. No pedal edema. Gastrointestinal system: Abdomen is nondistended, soft and nontender. No organomegaly or masses felt. Normal bowel sounds heard. Central nervous system: sleepy.  Extremities: no edema Skin: No rashes, lesions or ulcers Psychiatry: Judgement and insight appear normal. Mood & affect appropriate.     Data Reviewed: I have personally reviewed following labs and imaging studies  CBC:  Recent Labs Lab 08/20/16 2350 08/20/16 2359 08/21/16 0214 08/22/16 0459 08/23/16 0146  WBC 6.2  --  6.8 7.4 11.3*  NEUTROABS 5.0  --   --   --   --   HGB 8.8* 8.8* 8.9* 7.5* 9.6*  HCT 26.0* 26.0* 26.4* 22.1* 28.3*  MCV 92.5  --  93.6 92.9 90.7  PLT 215  --  212 188 [id number]   Basic Metabolic Panel:  Recent Labs Lab 08/21/16 0214 08/21/16 0644 08/21/16 1903 08/22/16 0459 08/23/16 0146  NA 127* 127* 128* 130* 129*  K 6.2* 5.5* 4.8 4.5 4.1  CL 99* 98* 99* 101 100*  CO2 20* 20* 21* 21* 17*  GLUCOSE 167* 245* 151* 140* 326*  BUN 44* 43* 39* 34* 30*  CREATININE 1.95* 1.88* 1.81* 1.54* 1.30*  CALCIUM 9.1 9.2 8.7* 8.5* 8.4*   GFR: Estimated Creatinine Clearance: 28.1 mL/min (by C-G formula based on SCr of 1.3 mg/dL (H)). Liver Function Tests: No results for input(s): AST, ALT, ALKPHOS, BILITOT, PROT, ALBUMIN in the last 168 hours. No results for input(s): LIPASE, AMYLASE in the last 168 hours. No results for input(s): AMMONIA in the last 168 hours. Coagulation Profile: No results for input(s): INR, PROTIME in the last 168 hours. Cardiac Enzymes:  Recent Labs Lab 08/21/16 0214 08/21/16 0603 08/21/16 1357  TROPONINI 0.15* 0.17* 0.20*   BNP (last 3 results) No results for input(s): PROBNP in the last 8760  hours. HbA1C: No results for input(s): HGBA1C in the last 72 hours. CBG:  Recent Labs Lab 08/22/16 0827 08/22/16 1229 08/22/16 1724 08/22/16 2209 08/23/16 0745  GLUCAP 144* 136* 254* 264* 314*   Lipid Profile: No results for input(s): CHOL, HDL, LDLCALC, TRIG, CHOLHDL, LDLDIRECT in the last 72 hours. Thyroid Function Tests:  Recent Labs  08/21/16 0603  TSH 2.284   Anemia Panel:  Recent Labs  08/22/16 0928  VITAMINB12 229  FOLATE 10.9  FERRITIN 136  TIBC 245*  IRON 81  RETICCTPCT 1.3   Sepsis Labs: No results for input(s): PROCALCITON, LATICACIDVEN in the last 168 hours.  Recent Results (from the past 240 hour(s))  Urine culture     Status: None   Collection Time: 08/14/16  3:50 PM  Result Value Ref Range Status   Culture ESCHERICHIA COLI  Final   Colony Count Greater than 100,000 CFU/mL  Final   Organism ID, Bacteria ESCHERICHIA COLI  Final      Susceptibility   Escherichia coli -  (no method available)    AMPICILLIN <=2 Sensitive     AMOX/CLAVULANIC <=2 Sensitive     AMPICILLIN/SULBACTAM <=2 Sensitive     PIP/TAZO <=4 Sensitive     IMIPENEM <=0.25 Sensitive     CEFAZOLIN <=4 Not Reportable     CEFTRIAXONE <=1 Sensitive     CEFTAZIDIME <=1 Sensitive     CEFEPIME <=1 Sensitive     GENTAMICIN <=1 Sensitive     TOBRAMYCIN <=1 Sensitive     CIPROFLOXACIN <=0.25 Sensitive     LEVOFLOXACIN <=0.12 Sensitive     NITROFURANTOIN <=16 Sensitive     TRIMETH/SULFA* <=20 Sensitive      * NR=NOT REPORTABLE,SEE COMMENTORAL therapy:A cefazolin MIC of <32 predicts susceptibility to the oral agents cefaclor,cefdinir,cefpodoxime,cefprozil,cefuroxime,cephalexin,and loracarbef when used for therapy of uncomplicated UTIs due to E.coli,K.pneumomiae,and P.mirabilis. PARENTERAL therapy: A cefazolinMIC of >8 indicates resistance to parenteralcefazolin. An alternate test method must beperformed to confirm susceptibility to parenteralcefazolin.  MRSA PCR Screening     Status: None     Collection Time: 08/21/16  5:51 AM  Result Value Ref Range Status   MRSA by PCR NEGATIVE NEGATIVE Final    Comment:        The GeneXpert MRSA Assay (FDA approved for NASAL specimens only), is one component of a comprehensive MRSA colonization surveillance program. It is not intended to diagnose MRSA infection nor to guide or monitor treatment for MRSA infections.   Culture, Urine     Status: Abnormal (Preliminary result)   Collection Time: 08/21/16 11:56 AM  Result Value Ref Range Status   Specimen Description URINE, RANDOM  Final   Special Requests NONE  Final   Culture >=100,000 COLONIES/mL ENTEROBACTER SPECIES (A)  Final   Report Status PENDING  Incomplete         Radiology Studies: No results found.      Scheduled Meds: . aspirin EC  81 mg Oral Daily  . ferrous sulfate  325 mg Oral Q breakfast  . insulin aspart  0-9 Units Subcutaneous TID WC  . insulin glargine  10 Units Subcutaneous Daily  . levothyroxine  100 mcg Oral QAC breakfast  . predniSONE  5 mg Oral Q breakfast  . sodium bicarbonate  1,300 mg Oral BID  . tiotropium  18 mcg Inhalation Daily   Continuous Infusions:     LOS: 2 days    Time spent: 35 minutes.     Elmarie Shiley, MD [facility] Pager [phone]  If 7PM-7AM, please contact night-coverage [email or web address] Password [email or web address] 08/23/2016, 7:58 AM

## 2016-08-24 ENCOUNTER — Inpatient Hospital Stay (HOSPITAL_COMMUNITY): Payer: Medicare Other

## 2016-08-24 DIAGNOSIS — R001 Bradycardia, unspecified: Secondary | ICD-10-CM

## 2016-08-24 DIAGNOSIS — R0600 Dyspnea, unspecified: Secondary | ICD-10-CM

## 2016-08-24 LAB — CBC
HEMATOCRIT: 31.3 % — AB (ref 36.0–46.0)
Hemoglobin: 10.7 g/dL — ABNORMAL LOW (ref 12.0–15.0)
MCH: 30.9 pg (ref 26.0–34.0)
MCHC: 34.2 g/dL (ref 30.0–36.0)
MCV: 90.5 fL (ref 78.0–100.0)
Platelets: 201 10*3/uL (ref 150–400)
RBC: 3.46 MIL/uL — ABNORMAL LOW (ref 3.87–5.11)
RDW: 14.1 % (ref 11.5–15.5)
WBC: 10.7 10*3/uL — ABNORMAL HIGH (ref 4.0–10.5)

## 2016-08-24 LAB — BASIC METABOLIC PANEL
Anion gap: 9 (ref 5–15)
BUN: 20 mg/dL (ref 6–20)
CO2: 23 mmol/L (ref 22–32)
Calcium: 8.3 mg/dL — ABNORMAL LOW (ref 8.9–10.3)
Chloride: 103 mmol/L (ref 101–111)
Creatinine, Ser: 0.93 mg/dL (ref 0.44–1.00)
GFR calc Af Amer: >60 mL/min (ref >60)
GFR, EST NON AFRICAN AMERICAN: 56 mL/min — AB (ref >60)
GLUCOSE: 122 mg/dL — AB (ref 65–99)
POTASSIUM: 3.1 mmol/L — AB (ref 3.5–5.1)
Sodium: 135 mmol/L (ref 135–145)

## 2016-08-24 LAB — GLUCOSE, CAPILLARY
GLUCOSE-CAPILLARY: 127 mg/dL — AB (ref 65–99)
GLUCOSE-CAPILLARY: 111 mg/dL — AB (ref 65–99)
GLUCOSE-CAPILLARY: 197 mg/dL — AB (ref 65–99)
Glucose-Capillary: 181 mg/dL — ABNORMAL HIGH (ref 65–99)

## 2016-08-24 LAB — AMMONIA: Ammonia: 16 umol/L (ref 9–35)

## 2016-08-24 MED ORDER — ACETAMINOPHEN 10 MG/ML IV SOLN
1000.0000 mg | Freq: Four times a day (QID) | INTRAVENOUS | Status: DC | PRN
  Filled 2016-08-24: qty 100

## 2016-08-24 MED ORDER — ASPIRIN 300 MG RE SUPP: 300.0000 mg | Freq: Every day | RECTAL | Status: DC

## 2016-08-24 MED ORDER — HYDROCORTISONE NA SUCCINATE PF 100 MG IJ SOLR
50.0000 mg | Freq: Two times a day (BID) | INTRAMUSCULAR | Status: DC
  Administered 2016-08-24 (×2): 50 mg via INTRAVENOUS
  Filled 2016-08-24 (×2): qty 2

## 2016-08-24 MED ORDER — LEVOTHYROXINE SODIUM 100 MCG IV SOLR
50.0000 ug | Freq: Every day | INTRAVENOUS | Status: DC
  Administered 2016-08-24: 50 ug via INTRAVENOUS
  Filled 2016-08-24: qty 5

## 2016-08-24 MED ORDER — ASPIRIN 300 MG RE SUPP
300.0000 mg | Freq: Every day | RECTAL | Status: DC
  Administered 2016-08-24: 300 mg via RECTAL
  Filled 2016-08-24: qty 1

## 2016-08-24 MED ORDER — POTASSIUM CHLORIDE 10 MEQ/100ML IV SOLN
10.0000 meq | INTRAVENOUS | Status: AC
  Administered 2016-08-24 (×2): 10 meq via INTRAVENOUS
  Filled 2016-08-24 (×2): qty 100

## 2016-08-24 MED ORDER — ENOXAPARIN SODIUM 40 MG/0.4ML ~~LOC~~ SOLN
40.0000 mg | SUBCUTANEOUS | Status: DC
  Administered 2016-08-24: 40 mg via SUBCUTANEOUS
  Filled 2016-08-24: qty 0.4

## 2016-08-24 MED ORDER — ASPIRIN 81 MG PO CHEW
CHEWABLE_TABLET | ORAL | Status: AC
  Filled 2016-08-24: qty 1

## 2016-08-24 MED ORDER — ASPIRIN EC 81 MG PO TBEC
81.0000 mg | DELAYED_RELEASE_TABLET | Freq: Every day | ORAL | Status: DC
  Administered 2016-08-24: 81 mg via ORAL
  Filled 2016-08-24: qty 1

## 2016-08-24 MED ORDER — ALBUTEROL SULFATE (2.5 MG/3ML) 0.083% IN NEBU: 2.5000 mg | INHALATION_SOLUTION | Freq: Four times a day (QID) | RESPIRATORY_TRACT | Status: DC | PRN

## 2016-08-24 NOTE — Progress Notes (Signed)
Patient more alert s/p MRI and opening eyes. Patient able to swallow medication and liquids without any signs of aspiration.

## 2016-08-24 NOTE — Progress Notes (Signed)
PROGRESS NOTE    Michelle [name]  [id number] DOB: 1932-10-17 DOA: 08/20/2016 PCP: [name], MD  Brief Narrative: Michelle [name] is a 82 y.o. female with history of DM, dementia, hypothyroidism, and HTN was found to be hypoglycemic with a complete heart block. Patient was recently treated for a UTI with bactrim until yesterday. She started vomiting the night before this. Patient has not been eating well the last few days. Since yesterday morning patient has become more weak and has been having persistent hypoglycemia. EMS was called later in the evening and at that time the patient was found to be bradycardic with a complete heart block. CBG in the 40's.She was started on epinephrine infusion and by the time patient reached the ER, her  heart block resolved and epinephrine was weaned off.  Patients labs show potassium 6, Creatinine was 2.0, and she had a mildly elevated troponin. EKG shows sinus bradycardia.   ED Course: Patient was given a fluid bolus and since patient has been on prednisone with possible adrenal crisis IV hydrocortisone was given. Patient went back into complete heart block transiently but went back to normal after benign given potassium chloride IV and 30g of kayexalate. Cardiology has been consulted.   Assessment & Plan:   Principal Problem:   Complete heart block (HCC) Active Problems:   Acute renal failure superimposed on stage 3 chronic kidney disease (HCC)   Alzheimer's dementia   Hypoglycemia   Hypertension   Hyperkalemia   Bradycardia   Dyspnea   Transient complete heart block - patient initially was in complete heart block when EMS was seeing the patient but improved with epinephrine infusion. Patient again had a transient episode in ER for which improved with Calcium chloride because of the hyperkalemia and heart rhythm improved back to normal sinus rhythm.  Cardiology has recommended temporary pacemaker, placed 10-12 Electrolytes abnormalities  corrected.  AV conduction appears to have normalized. Temporary Pacemaker was removed.   Hypoglycemia - resolved.   Dementia- delirium , in setting of infection, Hospitalization.  Will hold of Namenda  in the setting of bradycardia. Will continue to hold Seroquel due to the same.  Patient is more lethargic this morning. Will check stat CT head, Will repeat electrolytes. Will also remove fentanyl patch. Neurology consultation.   Acute renal failure with hyperkalemia - Probably poor intake with dehydration and on top of that patient was recently on bactrim and patiet was also taking ARB. And urine retention.  Hold ARB , and bactrim.  Had urine retention. Place foley  Improving.  Labs pending for this morning.   SOB;  Chest x ray question tiny pleural effusion.  Received one dose of IV lasix 10-14 Incentive spirometry/  Appears to be breathing ok, RR 15, Oxygen sat 96 RA.   UTI; urine growing enterococcus.  Sensitive to ceftriaxone. Day 2 antibiotics.   Hyponatremia; continue with IV fluids. Improving.  Labs pending.   Hyperkalemia; received calcium gluconate, IV lasix.  Received  f kayexalate.  Resolved.   Polymyalgia rheumatica accurrucada  - Patient is on stress dosed steroids. Resume hydrocortisone, wont be able to take oral prednisone.   Hypothyroidism- On synthroid.  TSH ;2.2  Diabetes; presents with hypoglycemia. Blood sugar increasing. Start SSI>    Anemia;  anemia panel B12 low normal, started supple,ment, . Received one unit PRBC>   DVT prophylaxis: SCD Code Status: Full code.  Family Communication: Daughter and granddaughter at bedside.  Disposition Plan: remain in the step down unit  Consultants:   Cardiology    Procedures:   Temporary pacemaker.   Antimicrobials:  Ceftriaxone 10-14  Subjective: She is lethargic, say few words. She says, she doesn't feel well.  Feels weak    Objective: Vitals:   08/24/16 0700 08/24/16 0800 08/24/16 0825  08/24/16 0900  BP: 139/73 (!) 143/73  (!) 141/70  Pulse: 82 91  86  Resp: 15 17  15   Temp:  98 F (36.7 C)    TempSrc:  Oral    SpO2: 93% 97% 98% 96%  Weight:      Height:        Intake/Output Summary (Last 24 hours) at 08/24/16 1029 Last data filed at 08/24/16 0900  Gross per 24 hour  Intake              350 ml  Output             2700 ml  Net            -2350 ml   Filed Weights   08/22/16 0405 08/23/16 0337 08/24/16 0500  Weight: 63.2 kg (139 lb 5.3 oz) 62.3 kg (137 lb 5.6 oz) 60.2 kg (132 lb 11.5 oz)    Examination:  General exam: lethargic, say few words.  Respiratory system: Clear to auscultation. Respiratory effort normal. Cardiovascular system: S1 & S2 heard, RRR. No JVD, murmurs, rubs, gallops or clicks. No pedal edema. Gastrointestinal system: Abdomen is nondistended, soft and nontender. No organomegaly or masses felt. Normal bowel sounds heard. Central nervous system: sleepy. Say few words, moves lower extremities passively. Upper extremities weak Extremities: no edema Skin: No rashes, lesions or ulcers Psychiatry: Judgement and insight appear normal. Mood & affect appropriate.     Data Reviewed: I have personally reviewed following labs and imaging studies  CBC:  Recent Labs Lab 08/20/16 2350 08/20/16 2359 08/21/16 0214 08/22/16 0459 08/23/16 0146  WBC 6.2  --  6.8 7.4 11.3*  NEUTROABS 5.0  --   --   --   --   HGB 8.8* 8.8* 8.9* 7.5* 9.6*  HCT 26.0* 26.0* 26.4* 22.1* 28.3*  MCV 92.5  --  93.6 92.9 90.7  PLT 215  --  212 188 [id number]   Basic Metabolic Panel:  Recent Labs Lab 08/21/16 0214 08/21/16 0644 08/21/16 1903 08/22/16 0459 08/23/16 0146  NA 127* 127* 128* 130* 129*  K 6.2* 5.5* 4.8 4.5 4.1  CL 99* 98* 99* 101 100*  CO2 20* 20* 21* 21* 17*  GLUCOSE 167* 245* 151* 140* 326*  BUN 44* 43* 39* 34* 30*  CREATININE 1.95* 1.88* 1.81* 1.54* 1.30*  CALCIUM 9.1 9.2 8.7* 8.5* 8.4*   GFR: Estimated Creatinine Clearance: 28.1 mL/min (by C-G  formula based on SCr of 1.3 mg/dL (H)). Liver Function Tests: No results for input(s): AST, ALT, ALKPHOS, BILITOT, PROT, ALBUMIN in the last 168 hours. No results for input(s): LIPASE, AMYLASE in the last 168 hours. No results for input(s): AMMONIA in the last 168 hours. Coagulation Profile: No results for input(s): INR, PROTIME in the last 168 hours. Cardiac Enzymes:  Recent Labs Lab 08/21/16 0214 08/21/16 0603 08/21/16 1357  TROPONINI 0.15* 0.17* 0.20*   BNP (last 3 results) No results for input(s): PROBNP in the last 8760 hours. HbA1C: No results for input(s): HGBA1C in the last 72 hours. CBG:  Recent Labs Lab 08/23/16 0745 08/23/16 1227 08/23/16 1617 08/23/16 2011 08/24/16 0800  GLUCAP 314* 188* 133* 147* 127*   Lipid Profile: No results for input(s):  CHOL, HDL, LDLCALC, TRIG, CHOLHDL, LDLDIRECT in the last 72 hours. Thyroid Function Tests: No results for input(s): TSH, T4TOTAL, FREET4, T3FREE, THYROIDAB in the last 72 hours. Anemia Panel:  Recent Labs  08/22/16 0928  VITAMINB12 229  FOLATE 10.9  FERRITIN 136  TIBC 245*  IRON 81  RETICCTPCT 1.3   Sepsis Labs: No results for input(s): PROCALCITON, LATICACIDVEN in the last 168 hours.  Recent Results (from the past 240 hour(s))  Urine culture     Status: None   Collection Time: 08/14/16  3:50 PM  Result Value Ref Range Status   Culture ESCHERICHIA COLI  Final   Colony Count Greater than 100,000 CFU/mL  Final   Organism ID, Bacteria ESCHERICHIA COLI  Final      Susceptibility   Escherichia coli -  (no method available)    AMPICILLIN <=2 Sensitive     AMOX/CLAVULANIC <=2 Sensitive     AMPICILLIN/SULBACTAM <=2 Sensitive     PIP/TAZO <=4 Sensitive     IMIPENEM <=0.25 Sensitive     CEFAZOLIN <=4 Not Reportable     CEFTRIAXONE <=1 Sensitive     CEFTAZIDIME <=1 Sensitive     CEFEPIME <=1 Sensitive     GENTAMICIN <=1 Sensitive     TOBRAMYCIN <=1 Sensitive     CIPROFLOXACIN <=0.25 Sensitive      LEVOFLOXACIN <=0.12 Sensitive     NITROFURANTOIN <=16 Sensitive     TRIMETH/SULFA* <=20 Sensitive      * NR=NOT REPORTABLE,SEE COMMENTORAL therapy:A cefazolin MIC of <32 predicts susceptibility to the oral agents cefaclor,cefdinir,cefpodoxime,cefprozil,cefuroxime,cephalexin,and loracarbef when used for therapy of uncomplicated UTIs due to E.coli,K.pneumomiae,and P.mirabilis. PARENTERAL therapy: A cefazolinMIC of >8 indicates resistance to parenteralcefazolin. An alternate test method must beperformed to confirm susceptibility to parenteralcefazolin.  MRSA PCR Screening     Status: None   Collection Time: 08/21/16  5:51 AM  Result Value Ref Range Status   MRSA by PCR NEGATIVE NEGATIVE Final    Comment:        The GeneXpert MRSA Assay (FDA approved for NASAL specimens only), is one component of a comprehensive MRSA colonization surveillance program. It is not intended to diagnose MRSA infection nor to guide or monitor treatment for MRSA infections.   Culture, Urine     Status: Abnormal   Collection Time: 08/21/16 11:56 AM  Result Value Ref Range Status   Specimen Description URINE, RANDOM  Final   Special Requests NONE  Final   Culture >=100,000 COLONIES/mL ENTEROBACTER SPECIES (A)  Final   Report Status 08/23/2016 FINAL  Final   Organism ID, Bacteria ENTEROBACTER SPECIES (A)  Final      Susceptibility   Enterobacter species - MIC*    CEFAZOLIN >=64 RESISTANT Resistant     CEFTRIAXONE <=1 SENSITIVE Sensitive     CIPROFLOXACIN <=0.25 SENSITIVE Sensitive     GENTAMICIN <=1 SENSITIVE Sensitive     IMIPENEM 0.5 SENSITIVE Sensitive     NITROFURANTOIN 32 SENSITIVE Sensitive     TRIMETH/SULFA >=320 RESISTANT Resistant     PIP/TAZO <=4 SENSITIVE Sensitive     * >=100,000 COLONIES/mL ENTEROBACTER SPECIES         Radiology Studies: Dg Chest Port 1 View  Result Date: 08/23/2016 CLINICAL DATA:  Shortness of breath. EXAM: PORTABLE CHEST 1 VIEW COMPARISON:  August 20, 2016 FINDINGS:  Question tiny right pleural effusion. No other interval changes or acute abnormalities. IMPRESSION: Question tiny right pleural effusion.  No other acute abnormality. Electronically Signed   By: Dorise Bullion III  M.D   On: 08/23/2016 10:27        Scheduled Meds: . aspirin EC  81 mg Oral Daily  . cefTRIAXone (ROCEPHIN)  IV  1 g Intravenous Q24H  . ferrous sulfate  325 mg Oral Q breakfast  . hydrocortisone sod succinate (SOLU-CORTEF) inj  50 mg Intravenous Q12H  . insulin aspart  0-9 Units Subcutaneous TID WC  . insulin glargine  10 Units Subcutaneous Daily  . levothyroxine  100 mcg Oral QAC breakfast  . sodium bicarbonate  1,300 mg Oral BID  . tamsulosin  0.4 mg Oral QPC breakfast  . tiotropium  18 mcg Inhalation Daily  . vitamin B-12  100 mcg Oral Daily   Continuous Infusions:     LOS: 3 days    Time spent: 35 minutes.     Elmarie Shiley, MD [facility] Pager [phone]  If 7PM-7AM, please contact night-coverage [email or web address] Password [email or web address] 08/24/2016, 10:29 AM

## 2016-08-24 NOTE — Progress Notes (Signed)
PT Cancellation Note  Patient Details Name: JASMINN CONES MRN: [id number] DOB: 03-13-1932   Cancelled Treatment:    Reason Eval/Treat Not Completed: Other (comment)   Patient sleeping soundly. Did not awaken as RN checked blood sugar. Per RN was very alert yesterday. Will return later today to evaluate   Amena Dockham 08/24/2016, 8:01 AM Pager [phone]

## 2016-08-24 NOTE — Progress Notes (Signed)
PT Cancellation Note  Patient Details Name: Michelle [name] MRN: [id number] DOB: [birth year]   Cancelled Treatment:    Reason Eval/Treat Not Completed: Patient not medically ready   Arrived and noted pt preparing to go for MRI due to increased lethargy. Will reattempt 10/16.   Jamol Ginyard 08/24/2016, 12:21 PM Pager [phone]

## 2016-08-24 NOTE — Consult Note (Signed)
Neurology Consultation Reason for Consult: Delirium Referring Physician: Tyrell Antonio, B   CC: Delirium  History is obtained from: Family  HPI: Michelle [name] is a 82 y.o. female he was admitted with urinary tract infection as well as complete heart block. Heart block was felt to be due to Namenda and therefore this is been held. 2 nights ago, she did not sleep at all. But last night she did quite soundly all night long. A fentanyl patch was placed yesterday, this is a home dose but had been held prior.  She has been confused ever since arriving to the hospital, but became progressively more so starting yesterday. She has an increasing white count between the 13th and the 14th.   At baseline, she is able to feed herself, able to walk with a walker. She does not remember to go to the bathroom unless someone tells her to do so. She has 24/7 care provided by family members.  She was on aricept previously, which was stopped due to bradycardia.    LKW: Unclear tpa given?: no, unclear time of onset    ROS:  Unable to obtain due to altered mental status.   Past Medical History:  Diagnosis Date  . Arthritis   . Asthma   . COPD (chronic obstructive pulmonary disease) ([place])   . Coronary atherosclerosis   . Dyslipidemia   . Gallstones   . History of fractured kneecap   . Hypothyroidism   . Interstitial cystitis   . Malaise and fatigue   . Memory loss   . Polymyalgia rheumatica ([place])   . Systemic hypertension   . Type II or unspecified type diabetes mellitus without mention of complication, not stated as uncontrolled   . UTI (lower urinary tract infection)      Family History  Problem Relation Age of Onset  . Breast cancer Mother   . Heart disease Father   . Lung cancer Brother      Social History:  reports that she has never smoked. She has never used smokeless tobacco. She reports that she does not drink alcohol or use drugs.   Exam: Current vital signs: BP (!) 141/70    Pulse 86   Temp 98 F (36.7 C) (Oral)   Resp 15   Ht 5\' 3"  (1.6 m)   Wt 60.2 kg (132 lb 11.5 oz)   SpO2 96%   BMI 23.51 kg/m  Vital signs in last 24 hours: Temp:  [97.4 F (36.3 C)-99.1 F (37.3 C)] 98 F (36.7 C) (10/15 0800) Pulse Rate:  [39-91] 86 (10/15 0900) Resp:  [10-21] 15 (10/15 0900) BP: (115-143)/(57-93) 141/70 (10/15 0900) SpO2:  [93 %-100 %] 96 % (10/15 0900) Weight:  [60.2 kg (132 lb 11.5 oz)] 60.2 kg (132 lb 11.5 oz) (10/15 0500)   Physical Exam  Constitutional: Appears Elderly Psych: She tries to not participate in any examination Eyes: No scleral injection HENT: No OP obstrucion Head: Normocephalic.  Cardiovascular: Normal rate and regular rhythm.  Respiratory: Effort normal and breath sounds normal to anterior ascultation GI: Soft.  No distension. There is no tenderness.  Skin: WDI  Neuro: Mental Status: Patient is awake, but keeps eyes closed. She does follow some simple commands, and answers questions but the uniform answer is "I don't know, and it doesn't matter." She will be specific with what does not matter, as in "I don't know you are and who you are does not matter." Cranial Nerves: II: Difficult to check visual fields given that  she does not participate and she tries to keep eyes tightly closed when I'm holding them open. She does fixate. Pupils are equal, round, and reactive to light.   III,IV, VI: She appears to possibly have a mild right gaze preference, but is unclear as she might be simply trying to avoid me. When examining her from the other side, however, she keeps her eyes midline and does not look either way per V: VII: Face appears grossly symmetric, blinks to eyelid stimulation bilaterally VIII: She does respond to voice commands X, XI, XII: Unable to assess secondary to patient's altered mental status.  Motor: She does not participate in any type of motor testing. Sensory: She response to noxious stimuli in all 4 extremities, will not  tell me where I am touching her no matter what extremity I am touching her in. Stating that "it does not matter where you're touching me " Cerebellar: She does not participate   I have reviewed labs in epic and the results pertinent to this consultation are: Mild hyponatremia  Impression: 82 year old female with a history of moderate to severe dementia who presents with delirium in the setting of UTI, heart block, prolonged ICU stay, Namenda withdrawal. I suspect that this is solely a multifactorial delirium, but with her persistent eye closure and this is something that can be seen in nondominant parietal lobe lesions and I do think it would be worthwhile to check an MRI.  Recommendations: 1) MRI brain 2) EEG 3) Ammonia 4) If above negative, likely multifactorial delirium that will take some time to improve.    Roland Rack, MD [facility] [phone]  If 7pm- 7am, please page neurology on call as listed in [place].

## 2016-08-24 NOTE — Progress Notes (Signed)
SUBJECTIVE: The patient is sleeping.  Comfortable.  Marland Kitchen aspirin EC  81 mg Oral Daily  . cefTRIAXone (ROCEPHIN)  IV  1 g Intravenous Q24H  . fentaNYL  25 mcg Transdermal Q72H  . ferrous sulfate  325 mg Oral Q breakfast  . insulin aspart  0-9 Units Subcutaneous TID WC  . insulin glargine  10 Units Subcutaneous Daily  . levothyroxine  100 mcg Oral QAC breakfast  . predniSONE  5 mg Oral Q breakfast  . sodium bicarbonate  1,300 mg Oral BID  . tamsulosin  0.4 mg Oral QPC breakfast  . tiotropium  18 mcg Inhalation Daily  . vitamin B-12  100 mcg Oral Daily      OBJECTIVE: Physical Exam: Vitals:   08/24/16 0300 08/24/16 0400 08/24/16 0500 08/24/16 0600  BP: 125/62 136/68 139/64 138/73  Pulse: 69 77 77 84  Resp: 12 17 13 14   Temp:  99.1 F (37.3 C)    TempSrc:  Axillary    SpO2: 99% 100% 95% 94%  Weight:   132 lb 11.5 oz (60.2 kg)   Height:        Intake/Output Summary (Last 24 hours) at 08/24/16 [id number] Last data filed at 08/24/16 0400  Gross per 24 hour  Intake              400 ml  Output             2700 ml  Net            -2300 ml    Telemetry reveals sinus rhythm  1:1 AV conduction  GEN- The patient is elderly appearing, sleeping Head- normocephalic, atraumatic Eyes-  Sclera clear, conjunctiva pink Ears- hearing intact Oropharynx- clear Neck- supple,  JVP 7 cm Lungs- clear, normal work of breathing Heart- Regular rate and rhythm  GI- soft, NT, ND, + BS Extremities- no clubbing, cyanosis, or edema Skin- no rash or lesion Psych- euthymic mood, full affect MS- thin and frail appearing  LABS: Basic Metabolic Panel:  Recent Labs  08/22/16 0459 08/23/16 0146  NA 130* 129*  K 4.5 4.1  CL 101 100*  CO2 21* 17*  GLUCOSE 140* 326*  BUN 34* 30*  CREATININE 1.54* 1.30*  CALCIUM 8.5* 8.4*   CBC:  Recent Labs  08/22/16 0459 08/23/16 0146  WBC 7.4 11.3*  HGB 7.5* 9.6*  HCT 22.1* 28.3*  MCV 92.9 90.7  PLT 188 197   Cardiac Enzymes:  Recent Labs  08/21/16 1357  TROPONINI 0.20*   Anemia Panel:  Recent Labs  08/22/16 0928  VITAMINB12 229  FOLATE 10.9  FERRITIN 136  TIBC 245*  IRON 81  RETICCTPCT 1.3    ASSESSMENT AND PLAN:   1. SOB Improved with gently diuresis Up to chair Consider IS for atalectasis  2. AV block Currently resolved off of namenda Per Dr Caryl Comes, hoping to avoid ppm  3. Reduced EF Gentle diuresis Caution with IVF  4. Dementia She has acute delirium which is exacerbated by current condition including UTI, hyponatremia, hypoxia, and ICU setting Would avoid dementia agents which can cause bradycardia. Yesterday Dr Tyrell Antonio and myself spoke at length with daughter who thinks that stopping namenda is the cause of delirium.  I have been very clear that she should return to her baseline cognition even off this medicine as her clinical state improves and she returns to her previous environment.  Ultimately, I think that a palliative care conversation with our palliative care team could be helpful for  this patient and her family as they deal with the normal process of end stage aging and dementia.  Up to chair PT consult Transfer to telemetry today.  Thompson Grayer, MD 08/24/2016 8:03 AM

## 2016-08-24 NOTE — Progress Notes (Signed)
Patient more lethargic today then my prior assessment yesterday. Patient follows commands but refusing to take po medications at this time. Dr Tyrell Antonio in formed, orders received. Fentanyl patch removed per MD.

## 2016-08-25 ENCOUNTER — Inpatient Hospital Stay (HOSPITAL_COMMUNITY): Payer: Medicare Other

## 2016-08-25 LAB — CBC
HEMATOCRIT: 30.2 % — AB (ref 36.0–46.0)
Hemoglobin: 10.2 g/dL — ABNORMAL LOW (ref 12.0–15.0)
MCH: 31 pg (ref 26.0–34.0)
MCHC: 33.8 g/dL (ref 30.0–36.0)
MCV: 91.8 fL (ref 78.0–100.0)
PLATELETS: 203 10*3/uL (ref 150–400)
RBC: 3.29 MIL/uL — AB (ref 3.87–5.11)
RDW: 13.9 % (ref 11.5–15.5)
WBC: 7.6 10*3/uL (ref 4.0–10.5)

## 2016-08-25 LAB — BASIC METABOLIC PANEL
Anion gap: 14 (ref 5–15)
BUN: 29 mg/dL — AB (ref 6–20)
CO2: 21 mmol/L — AB (ref 22–32)
Calcium: 7.9 mg/dL — ABNORMAL LOW (ref 8.9–10.3)
Chloride: 100 mmol/L — ABNORMAL LOW (ref 101–111)
Creatinine, Ser: 1.22 mg/dL — ABNORMAL HIGH (ref 0.44–1.00)
GFR calc Af Amer: 47 mL/min — ABNORMAL LOW (ref >60)
GFR, EST NON AFRICAN AMERICAN: 40 mL/min — AB (ref >60)
GLUCOSE: 276 mg/dL — AB (ref 65–99)
POTASSIUM: 4 mmol/L (ref 3.5–5.1)
Sodium: 135 mmol/L (ref 135–145)

## 2016-08-25 LAB — LIPID PANEL
CHOL/HDL RATIO: 2.9 ratio
CHOLESTEROL: 193 mg/dL (ref 0–200)
HDL: 66 mg/dL (ref >40)
LDL Cholesterol: 112 mg/dL — ABNORMAL HIGH (ref 0–99)
Triglycerides: 74 mg/dL (ref <150)
VLDL: 15 mg/dL (ref 0–40)

## 2016-08-25 LAB — GLUCOSE, CAPILLARY
Glucose-Capillary: 288 mg/dL — ABNORMAL HIGH (ref 65–99)
Glucose-Capillary: 344 mg/dL — ABNORMAL HIGH (ref 65–99)

## 2016-08-25 MED ORDER — CEFIXIME 100 MG/5ML PO SUSR
260.0000 mg | Freq: Every day | ORAL | Status: DC
  Administered 2016-08-25: 260 mg via ORAL
  Filled 2016-08-25: qty 13

## 2016-08-25 MED ORDER — LEVOTHYROXINE SODIUM 100 MCG PO TABS
100.0000 ug | ORAL_TABLET | Freq: Every day | ORAL | Status: DC
  Administered 2016-08-25: 100 ug via ORAL
  Filled 2016-08-25: qty 1

## 2016-08-25 MED ORDER — PREDNISONE 20 MG PO TABS
20.0000 mg | ORAL_TABLET | Freq: Every day | ORAL | Status: DC
  Administered 2016-08-25: 20 mg via ORAL
  Filled 2016-08-25: qty 1

## 2016-08-25 MED ORDER — ASPIRIN EC 81 MG PO TBEC
81.0000 mg | DELAYED_RELEASE_TABLET | Freq: Every day | ORAL | Status: DC
  Administered 2016-08-25: 81 mg via ORAL
  Filled 2016-08-25: qty 1

## 2016-08-25 MED ORDER — CYANOCOBALAMIN 100 MCG PO TABS: 100.0000 ug | ORAL_TABLET | Freq: Every day | ORAL | 0 refills | Status: DC

## 2016-08-25 MED ORDER — TAMSULOSIN HCL 0.4 MG PO CAPS: 0.4000 mg | ORAL_CAPSULE | Freq: Every day | ORAL | 0 refills | Status: DC

## 2016-08-25 MED ORDER — CEFIXIME 100 MG/5ML PO SUSR: 260.0000 mg | Freq: Every day | ORAL | 0 refills | Status: DC

## 2016-08-25 MED ORDER — ACETAMINOPHEN 325 MG PO TABS: 650.0000 mg | ORAL_TABLET | Freq: Four times a day (QID) | ORAL | 0 refills | Status: DC | PRN

## 2016-08-25 NOTE — Discharge Summary (Signed)
Physician Discharge Summary  Michelle [name] [id number] DOB: [birth year] DOA: 08/20/2016  PCP: [name], MD  Admit date: 08/20/2016 Discharge date: 08/25/2016  Admitted From: Home  Disposition:  Home   Recommendations for Outpatient Follow-up:  1. Follow up with PCP in 1-2 weeks 2. Please obtain BMP/CBC in one week 3. Primary care consider discussion regarding palliative care.    Home Health: yes.   Discharge Condition: stable, improved.  CODE STATUS: full code.  Diet recommendation: Heart Healthy   Brief/Interim Summary: Michelle [name] a 82 y.o.femalewith history of DM, dementia, hypothyroidism, and HTN was found to be hypoglycemic with a complete heart block. Patient was recently treated for a UTI with bactrim until yesterday. She started vomiting the night before this. Patient has not been eating well the last few days. Since yesterday morning patient has become more weak and has been having persistent hypoglycemia. EMS was called later in the evening and at that time the patient was found to be bradycardic with a complete heart block. CBG in the 40's.She was started on epinephrine infusion and by the time patient reached the ER, her heart block resolved and epinephrine was weaned off. Patients labs show potassium 6, Creatinine was 2.0, and she had a mildly elevated troponin. EKG shows sinus bradycardia.   ED Course:Patient was given a fluid bolus and since patient has been on prednisone with possible adrenal crisis IV hydrocortisone was given. Patient went back into complete heart block transiently but went back to normal after benign given potassium chloride IV and 30g of kayexalate. Cardiology has been consulted.   Assessment & Plan:   Transient complete heart block- patient initially was in complete heart block when EMS was seeing the patient but improved with epinephrine infusion. Patient again had a transient episode in ER for which improved with  Calciumchloride because of the hyperkalemia and heart rhythm improved back to normal sinus rhythm.  Cardiology has recommended temporary pacemaker, placed 10-12 Electrolytes abnormalities corrected.  AV conduction appears to have normalized. Temporary Pacemaker was removed.  Doing well.  Continue to hold at discharge namenda and Seroquel.   Hypoglycemia - resolved.   Dementia- delirium , in setting of infection, Hospitalization.  Continue to hold of Namenda in the setting of bradycardia, AV block.  Will continue to hold Seroquel due to the same.  Patient was lethargic on 10-15, MRI showed lacunar infarct, discussed with neurology this is unlikely cause of patient presentation. Continue with aspirin, no statin to avoid weakness. Diet management.  Patient is back to baseline, alert, conversant. Stable for discharge. She will benefit of been in familiar environment   Acute renal failure with hyperkalemia - Probably poor intake with dehydration and on top of that patient was recently on bactrim and patiet was also taking ARB. And urine retention.  Hold ARB , and bactrim.  Had urine retention. Place foley . Now able to urinates. Was started on flomax.  Improving.  Encourage oral intake. Wont be able to do IV fluids to avoid pulmonary edema.  stop HCTZ, and telmisartan due to renal failure.   SOB; resolved.  Chest x ray question tiny pleural effusion.  Received one dose of IV lasix 10-14 Incentive spirometry/  Appears to be breathing ok, RR 15, Oxygen sat 96 RA.   UTI; urine growing enterococcus.  Sensitive to ceftriaxone. Day 3 antibiotics.  to be discharge on Cefixime for 4 more days.  Foley catheter removed. Started on flomax. Patient was able to urinate.   Hyponatremia;  continue with IV fluids. Improving.  Resolved.   Hyperkalemia; received calcium gluconate, IV lasix.  Received  f kayexalate.  Resolved.   Polymyalgia rheumatica accurrucada- Patient is on stress dosed  steroids.  Resume home dose prednisone.  resume fentanyl patch.   Hypothyroidism- On synthroid.  TSH ;2.2  Diabetes; presents with hypoglycemia. Blood sugar increasing. Start SSI>  resume NPH at discharge  Anemia;  anemia panel B12 low normal, started supple,ment, . Received one unit PRBC>   Discharge Diagnoses:  Principal Problem:   Complete heart block ([place]) Active Problems:   Acute renal failure superimposed on stage 3 chronic kidney disease (HCC)   Alzheimer's dementia   Hypoglycemia   Hypertension   Hyperkalemia   Bradycardia   Dyspnea    Discharge Instructions  Discharge Instructions    Diet - low sodium heart healthy    Complete by:  As directed    Increase activity slowly    Complete by:  As directed        Medication List    STOP taking these medications   amLODipine 10 MG tablet Commonly known as:  NORVASC   hydrochlorothiazide 25 MG tablet Commonly known as:  HYDRODIURIL   memantine 10 MG tablet Commonly known as:  NAMENDA   QUEtiapine 25 MG tablet Commonly known as:  SEROQUEL   telmisartan 40 MG tablet Commonly known as:  MICARDIS     TAKE these medications   acetaminophen 325 MG tablet Commonly known as:  TYLENOL Take 2 tablets (650 mg total) by mouth every 6 (six) hours as needed for mild pain (or Fever >/= 101).   aspirin 81 MG tablet Take 81 mg by mouth daily.   CALCIUM 1200 PO Take 1 tablet by mouth daily.   cefixime 100 MG/5ML suspension Commonly known as:  SUPRAX Take 13 mLs (260 mg total) by mouth daily.   cyanocobalamin 100 MCG tablet Take 1 tablet (100 mcg total) by mouth daily. Start taking on:  08/26/2016   feeding supplement (ENSURE ENLIVE) Liqd Take 237 mLs by mouth 2 (two) times daily between meals. What changed:  when to take this   fentaNYL 25 MCG/HR patch Commonly known as:  [place] - dosed mcg/hr Place 25 mcg onto the skin every 3 (three) days.   ferrous sulfate 325 (65 FE) MG tablet Take 325 mg by  mouth daily with breakfast.   HYDROcodone-acetaminophen 5-325 MG tablet Commonly known as:  NORCO/VICODIN Take 1 tablet by mouth every 6 (six) hours as needed for moderate pain.   insulin lispro 100 UNIT/ML injection Commonly known as:  HUMALOG Inject 0-0.06 mLs (0-6 Units total) into the skin 3 (three) times daily as needed for high blood sugar. Patient uses sliding scale   insulin NPH Human 100 UNIT/ML injection Commonly known as:  HUMULIN N,NOVOLIN N Inject 0.18 mLs (18 Units total) into the skin every morning. What changed:  how much to take  when to take this  additional instructions   Insulin Syringe-Needle U-100 31G X 5/16" 1 ML Misc For injection of insulin QID   levothyroxine 100 MCG tablet Commonly known as:  SYNTHROID, LEVOTHROID TAKE 1 TABLET (100 MCG TOTAL) BY MOUTH DAILY.   OCUVITE PRESERVISION PO Take 1 capsule by mouth daily.   ONE TOUCH ULTRA TEST test strip Generic drug:  glucose blood TEST BS 5-6 TIMES A DAY   predniSONE 5 MG tablet Commonly known as:  DELTASONE Take 1 tablet by mouth daily.   psyllium 95 % Pack Commonly known  as:  HYDROCIL/METAMUCIL Take 1 packet by mouth 3 (three) times daily.   tamsulosin 0.4 MG Caps capsule Commonly known as:  FLOMAX Take 1 capsule (0.4 mg total) by mouth daily after breakfast. Start taking on:  08/26/2016   tiotropium 18 MCG inhalation capsule Commonly known as:  SPIRIVA HANDIHALER Place 1 capsule (18 mcg total) into inhaler and inhale daily.   UNABLE TO FIND Take 1 tablet by mouth daily. Med Name: probiotic capsule   Takes one daily.   Vitamin D3 2000 units Tabs Take 2,000 Units by mouth daily.       No Known Allergies  Consultations:  Cardiology   Neurology    Procedures/Studies: Mr Brain Wo Contrast  Result Date: 08/24/2016 CLINICAL DATA:  Altered mental stage.  Urinary tract infection. EXAM: MRI HEAD WITHOUT CONTRAST TECHNIQUE: Multiplanar, multiecho pulse sequences of the brain and  surrounding structures were obtained without intravenous contrast. COMPARISON:  MRI brain 07/18/2015 FINDINGS: Brain: 3 separate punctate foci of acute nonhemorrhagic infarction are present within the right lentiform nucleus. No significant left-sided infarcts are present. T2 signal changes are associated with these areas of acute/subacute infarction. Additional lacunar infarcts are present posteriorly in the right caudate and in the posterior right centrum semi ovale. Moderate age advanced generalized atrophy is present. The ventricles are proportionate to the degree of atrophy. Mild white matter changes extend into the brainstem. The cerebellum is unremarkable. Vascular: Flow is present in the major intracranial arteries. Skull and upper cervical spine: The skullbase is within normal limits. Midline intracranial sagittal structures are normal. Cervical fusion is present at C3-4 and C4-5. Sinuses/Orbits: Mild mucosal thickening is present in the anterior ethmoid air cells bilaterally, left greater than right. There is a small fluid level in the left sphenoid sinus. Minimal fluid is present in the inferior left mastoid air cells. No obstructing nasopharyngeal lesion is present. IMPRESSION: 1. 3 separate punctate foci of acute/subacute lacunar infarction in the right lentiform nucleus. 2. At least 2 additional remote lacunar infarcts on the right as described. 3. Moderate age advanced atrophy. 4. Mild sinus disease most notably in the anterior left ethmoid air cells and left sphenoid sinus. Electronically Signed   By: San Morelle M.D.   On: 08/24/2016 13:59   Dg Chest Port 1 View  Result Date: 08/23/2016 CLINICAL DATA:  Shortness of breath. EXAM: PORTABLE CHEST 1 VIEW COMPARISON:  August 20, 2016 FINDINGS: Question tiny right pleural effusion. No other interval changes or acute abnormalities. IMPRESSION: Question tiny right pleural effusion.  No other acute abnormality. Electronically Signed   By:  Dorise Bullion III M.D   On: 08/23/2016 10:27   Dg Chest Portable 1 View  Result Date: 08/21/2016 CLINICAL DATA:  Pulmonary edema EXAM: PORTABLE CHEST 1 VIEW COMPARISON:  03/10/2016 FINDINGS: AP portable semi-erect view of the chest. Central chest obscured by pacer patch. Mildly low lung volumes. Minimal atelectasis at the right CP angle. No acute consolidation. Mild cardiomegaly. Atherosclerosis of the aorta. No pneumothorax. Presumed clips in the right upper quadrant. IMPRESSION: 1. Low lung volumes with minimal atelectasis right CP angle. 2. Borderline to mild cardiomegaly with prominent central pulmonary arteries. 3. Atherosclerotic vascular calcification of the aorta Electronically Signed   By: Donavan Foil M.D.   On: 08/21/2016 00:01       Subjective: Alert, conversant, eating breakfast. Feeling well.   Discharge Exam: Vitals:   08/25/16 0900 08/25/16 1203  BP: (!) 154/81 133/67  Pulse: 72 65  Resp: 16 15  Temp:  97.7 F (36.5 C)   Vitals:   08/25/16 0806 08/25/16 0900 08/25/16 0955 08/25/16 1203  BP: (!) 116/46 (!) 154/81  133/67  Pulse: 70 72  65  Resp: 14 16  15   Temp: 97.6 F (36.4 C)   97.7 F (36.5 C)  TempSrc: Oral   Oral  SpO2: 97% 99% 100% 100%  Weight:      Height:        General: Pt is alert, awake, not in acute distress Cardiovascular: RRR, S1/S2 +, no rubs, no gallops Respiratory: CTA bilaterally, no wheezing, no rhonchi Abdominal: Soft, NT, ND, bowel sounds + Extremities: no edema, no cyanosis    The results of significant diagnostics from this hospitalization (including imaging, microbiology, ancillary and laboratory) are listed below for reference.     Microbiology: Recent Results (from the past 240 hour(s))  MRSA PCR Screening     Status: None   Collection Time: 08/21/16  5:51 AM  Result Value Ref Range Status   MRSA by PCR NEGATIVE NEGATIVE Final    Comment:        The GeneXpert MRSA Assay (FDA approved for NASAL specimens only), is  one component of a comprehensive MRSA colonization surveillance program. It is not intended to diagnose MRSA infection nor to guide or monitor treatment for MRSA infections.   Culture, Urine     Status: Abnormal   Collection Time: 08/21/16 11:56 AM  Result Value Ref Range Status   Specimen Description URINE, RANDOM  Final   Special Requests NONE  Final   Culture >=100,000 COLONIES/mL ENTEROBACTER SPECIES (A)  Final   Report Status 08/23/2016 FINAL  Final   Organism ID, Bacteria ENTEROBACTER SPECIES (A)  Final      Susceptibility   Enterobacter species - MIC*    CEFAZOLIN >=64 RESISTANT Resistant     CEFTRIAXONE <=1 SENSITIVE Sensitive     CIPROFLOXACIN <=0.25 SENSITIVE Sensitive     GENTAMICIN <=1 SENSITIVE Sensitive     IMIPENEM 0.5 SENSITIVE Sensitive     NITROFURANTOIN 32 SENSITIVE Sensitive     TRIMETH/SULFA >=320 RESISTANT Resistant     PIP/TAZO <=4 SENSITIVE Sensitive     * >=100,000 COLONIES/mL ENTEROBACTER SPECIES     Labs: BNP (last 3 results) No results for input(s): BNP in the last 8760 hours. Basic Metabolic Panel:  Recent Labs Lab 08/21/16 1903 08/22/16 0459 08/23/16 0146 08/24/16 1055 08/25/16 0612  NA 128* 130* 129* 135 135  K 4.8 4.5 4.1 3.1* 4.0  CL 99* 101 100* 103 100*  CO2 21* 21* 17* 23 21*  GLUCOSE 151* 140* 326* 122* 276*  BUN 39* 34* 30* 20 29*  CREATININE 1.81* 1.54* 1.30* 0.93 1.22*  CALCIUM 8.7* 8.5* 8.4* 8.3* 7.9*   Liver Function Tests: No results for input(s): AST, ALT, ALKPHOS, BILITOT, PROT, ALBUMIN in the last 168 hours. No results for input(s): LIPASE, AMYLASE in the last 168 hours.  Recent Labs Lab 08/24/16 1129  AMMONIA 16   CBC:  Recent Labs Lab 08/20/16 2350  08/21/16 0214 08/22/16 0459 08/23/16 0146 08/24/16 1055 08/25/16 0612  WBC 6.2  --  6.8 7.4 11.3* 10.7* 7.6  NEUTROABS 5.0  --   --   --   --   --   --   HGB 8.8*  < > 8.9* 7.5* 9.6* 10.7* 10.2*  HCT 26.0*  < > 26.4* 22.1* 28.3* 31.3* 30.2*  MCV 92.5   --  93.6 92.9 90.7 90.5 91.8  PLT 215  --  212 188 197 201 203  < > = values in this interval not displayed. Cardiac Enzymes:  Recent Labs Lab 08/21/16 0214 08/21/16 0603 08/21/16 1357  TROPONINI 0.15* 0.17* 0.20*   BNP: Invalid input(s): POCBNP CBG:  Recent Labs Lab 08/24/16 1146 08/24/16 1641 08/24/16 2138 08/25/16 0810 08/25/16 1200  GLUCAP 111* 197* 181* 288* 344*   D-Dimer No results for input(s): DDIMER in the last 72 hours. Hgb A1c No results for input(s): HGBA1C in the last 72 hours. Lipid Profile  Recent Labs  08/25/16 0258  CHOL 193  HDL 66  LDLCALC 112*  TRIG 74  CHOLHDL 2.9   Thyroid function studies No results for input(s): TSH, T4TOTAL, T3FREE, THYROIDAB in the last 72 hours.  Invalid input(s): FREET3 Anemia work up No results for input(s): VITAMINB12, FOLATE, FERRITIN, TIBC, IRON, RETICCTPCT in the last 72 hours. Urinalysis    Component Value Date/Time   COLORURINE YELLOW 08/21/2016 1157   APPEARANCEUR TURBID (A) 08/21/2016 1157   LABSPEC 1.008 08/21/2016 1157   PHURINE 5.5 08/21/2016 1157   GLUCOSEU NEGATIVE 08/21/2016 1157   HGBUR MODERATE (A) 08/21/2016 1157   BILIRUBINUR NEGATIVE 08/21/2016 1157   KETONESUR NEGATIVE 08/21/2016 1157   PROTEINUR 100 (A) 08/21/2016 1157   UROBILINOGEN 1.0 06/12/2015 1937   NITRITE POSITIVE (A) 08/21/2016 1157   LEUKOCYTESUR LARGE (A) 08/21/2016 1157   Sepsis Labs Invalid input(s): PROCALCITONIN,  WBC,  LACTICIDVEN Microbiology Recent Results (from the past 240 hour(s))  MRSA PCR Screening     Status: None   Collection Time: 08/21/16  5:51 AM  Result Value Ref Range Status   MRSA by PCR NEGATIVE NEGATIVE Final    Comment:        The GeneXpert MRSA Assay (FDA approved for NASAL specimens only), is one component of a comprehensive MRSA colonization surveillance program. It is not intended to diagnose MRSA infection nor to guide or monitor treatment for MRSA infections.   Culture, Urine      Status: Abnormal   Collection Time: 08/21/16 11:56 AM  Result Value Ref Range Status   Specimen Description URINE, RANDOM  Final   Special Requests NONE  Final   Culture >=100,000 COLONIES/mL ENTEROBACTER SPECIES (A)  Final   Report Status 08/23/2016 FINAL  Final   Organism ID, Bacteria ENTEROBACTER SPECIES (A)  Final      Susceptibility   Enterobacter species - MIC*    CEFAZOLIN >=64 RESISTANT Resistant     CEFTRIAXONE <=1 SENSITIVE Sensitive     CIPROFLOXACIN <=0.25 SENSITIVE Sensitive     GENTAMICIN <=1 SENSITIVE Sensitive     IMIPENEM 0.5 SENSITIVE Sensitive     NITROFURANTOIN 32 SENSITIVE Sensitive     TRIMETH/SULFA >=320 RESISTANT Resistant     PIP/TAZO <=4 SENSITIVE Sensitive     * >=100,000 COLONIES/mL ENTEROBACTER SPECIES     Time coordinating discharge: Over 30 minutes  SIGNED:   Elmarie Shiley, MD  [facility] 08/25/2016, 2:51 PM Pager   If 7PM-7AM, please contact night-coverage [email or web address] Password [email or web address]

## 2016-08-25 NOTE — Evaluation (Signed)
Clinical/Bedside Swallow Evaluation Patient Details  Name: Michelle [name] MRN: [id number] Date of Birth: [birth year]  Today's Date: 08/25/2016 Time: SLP Start Time (ACUTE ONLY): 0850 SLP Stop Time (ACUTE ONLY): 0909 SLP Time Calculation (min) (ACUTE ONLY): 19 min  Past Medical History:  Past Medical History:  Diagnosis Date  . Arthritis   . Asthma   . COPD (chronic obstructive pulmonary disease) ([place])   . Coronary atherosclerosis   . Dyslipidemia   . Gallstones   . History of fractured kneecap   . Hypothyroidism   . Interstitial cystitis   . Malaise and fatigue   . Memory loss   . Polymyalgia rheumatica ([place])   . Systemic hypertension   . Type II or unspecified type diabetes mellitus without mention of complication, not stated as uncontrolled   . UTI (lower urinary tract infection)    Past Surgical History:  Past Surgical History:  Procedure Laterality Date  . CARDIAC CATHETERIZATION  01/29/2006   10% luminal irregularity mid LAD  . CARDIAC CATHETERIZATION  03/15/2001   No significant CAD, no evidence of renal artery stenosis, systemic hypertenion  . CARDIAC CATHETERIZATION N/A 08/21/2016   Procedure: Temporary Pacemaker;  Surgeon: Jettie Booze, MD;  Location: [facility];  Service: Cardiovascular;  Laterality: N/A;  . CERVICAL SPINE SURGERY    . CHOLECYSTECTOMY    . HERNIA REPAIR    . LUMBAR DISC SURGERY    . NM MYOCAR PERF WALL MOTION  08/17/2009   normal  . ROTATOR CUFF REPAIR    . TONSILLECTOMY    . TOTAL ABDOMINAL HYSTERECTOMY    . US ECHOCARDIOGRAPHY  09/14/2007   mild LVH, LA mildly dilated,mild mitral annular calcification, AOV mildly sclerotic, aortic root sclerosis/ca+   HPI:  82 year old female with a history of moderate to severe dementia who presents with delirium in the setting of UTI, heart block, prolonged ICU stay, Namenda withdrawal. MRI: separate punctate foci of acute/subacute lacunar infarction in   Assessment / Plan /  Recommendation Clinical Impression  Patient presents with a functional oropharyngeal swallow. Oral transit of bolus prolonged due to dementia dx however remains functional. No overt indication of aspiration noted. No SLP f/u indicated at this time.     Aspiration Risk  Mild aspiration risk    Diet Recommendation Regular;Thin liquid   Liquid Administration via: Straw;Cup Medication Administration: Whole meds with liquid Supervision: Patient able to self feed Compensations: Slow rate;Small sips/bites Postural Changes: Seated upright at 90 degrees    Other  Recommendations Oral Care Recommendations: Oral care BID   Follow up Recommendations None        Swallow Study   General HPI: 82 year old female with a history of moderate to severe dementia who presents with delirium in the setting of UTI, heart block, prolonged ICU stay, Namenda withdrawal. MRI: separate punctate foci of acute/subacute lacunar infarction in Type of Study: Bedside Swallow Evaluation Previous Swallow Assessment: none Diet Prior to this Study: Regular;Thin liquids Temperature Spikes Noted: No Respiratory Status: Room air History of Recent Intubation: No Behavior/Cognition: Alert;Cooperative;Pleasant mood Oral Cavity Assessment: Within Functional Limits Oral Care Completed by SLP: No Oral Cavity - Dentition: Adequate natural dentition Vision: Functional for self-feeding Self-Feeding Abilities: Able to feed self Patient Positioning: Upright in bed Baseline Vocal Quality: Normal Volitional Cough: Strong Volitional Swallow: Able to elicit    Oral/Motor/Sensory Function Overall Oral Motor/Sensory Function: Within functional limits   Ice Chips Ice chips: Not tested   Thin Liquid Thin Liquid: Within functional limits  Presentation: Cup;Self Fed;Straw    Nectar Thick Nectar Thick Liquid: Not tested   Honey Thick Honey Thick Liquid: Not tested   Puree Puree: Within functional limits Presentation: Self Fed;Spoon    Solid   GO  Michelle Macadam MA, [name] [phone]  Solid: Within functional limits Other Comments: prolonged oral transit but functional        Michelle [name] Michelle [name] 08/25/2016,9:12 AM

## 2016-08-25 NOTE — Evaluation (Signed)
Physical Therapy Evaluation Patient Details Name: Michelle [name] MRN: [id number] DOB: [birth year] Today's Date: 08/25/2016   History of Present Illness  82 y.o.femalehe was admitted with urinary tract infection as well as complete heart block. Heart block was felt to be due to Namenda and therefore this is been held. 10/15 was lethargic with MRI brain showing 3 separate punctate foci of acute/subacute lacunar infarction in    Clinical Impression  Pt admitted with above diagnosis. Pt currently with functional limitations due to the deficits listed below (see PT Problem List). Per family/aide she is nearly at baseline (some incr general weakness). Pt will benefit from skilled PT to increase their independence and safety with mobility to allow discharge to the venue listed below.       Follow Up Recommendations No PT follow up;Supervision/Assistance - 24 hour (has 24/7 care with family and aide)    Equipment Recommendations  None recommended by PT    Recommendations for Other Services OT consult     Precautions / Restrictions Precautions Precautions: Fall Restrictions Weight Bearing Restrictions: No      Mobility  Bed Mobility Overal bed mobility: Needs Assistance Bed Mobility: Supine to Sit     Supine to sit: Min guard     General bed mobility comments: on deflating ICU bed with incr effort, HOB flat  Transfers Overall transfer level: Needs assistance Equipment used: Rolling walker (2 wheeled) Transfers: Sit to/from Stand Sit to Stand: Min guard         General transfer comment: minguard for safety; denied dizziness; vc for appropriate approach to chair with RW  Ambulation/Gait Ambulation/Gait assistance: Min guard Ambulation Distance (Feet): 12 Feet Assistive device: Rolling walker (2 wheeled) Gait Pattern/deviations: Step-through pattern;Decreased stride length;Trunk flexed;Shuffle Gait velocity: decr      Stairs            Wheelchair Mobility     Modified Rankin (Stroke Patients Only) Modified Rankin (Stroke Patients Only) Pre-Morbid Rankin Score: Moderately severe disability Modified Rankin: Moderately severe disability     Balance Overall balance assessment: Needs assistance;History of Falls Sitting-balance support: No upper extremity supported;Feet unsupported Sitting balance-Leahy Scale: Fair     Standing balance support: Bilateral upper extremity supported Standing balance-Leahy Scale: Poor                               Pertinent Vitals/Pain VSS per monitor in step-down  Pain Assessment: No/denies pain    Home Living Family/patient expects to be discharged to:: Private residence Living Arrangements: Other relatives (sister; caregiver during day) Available Help at Discharge: Family;Personal care attendant;Available 24 hours/day Type of Home: House Home Access: Ramped entrance     Home Layout: One level Home Equipment: Walker - 2 wheels;Cane - single point;Shower seat;Grab bars - toilet;Grab bars - tub/shower;Wheelchair - manual      Prior Function Level of Independence: Needs assistance   Gait / Transfers Assistance Needed: supervision for ambulation with RW; walks up to 1 mile at park pushing w/c (with multiple seated rest breaks; last fall ~4 weeks ago in  bathroom   ADL's / Homemaking Assistance Needed: assist with bathing/dressing        Hand Dominance        Extremity/Trunk Assessment   Upper Extremity Assessment: Generalized weakness           Lower Extremity Assessment: Generalized weakness      Cervical / Trunk Assessment: Kyphotic  Communication  Communication: No difficulties  Cognition Arousal/Alertness: Awake/alert Behavior During Therapy: WFL for tasks assessed/performed Overall Cognitive Status: History of cognitive impairments - at baseline       Memory: Decreased short-term memory (h/o dementia)              General Comments General comments  (skin integrity, edema, etc.): Daughter and in home caregiver present     Exercises     Assessment/Plan    PT Assessment Patient needs continued PT services  PT Problem List Decreased strength;Decreased activity tolerance;Decreased balance;Decreased mobility;Decreased cognition;Decreased knowledge of use of DME;Decreased safety awareness          PT Treatment Interventions DME instruction;Gait training;Functional mobility training;Therapeutic activities;Balance training;Neuromuscular re-education;Cognitive remediation;Patient/family education    PT Goals (Current goals can be found in the Care Plan section)  Acute Rehab PT Goals Patient Stated Goal: daughter wants pt to return home from hospital PT Goal Formulation: With patient/family Time For Goal Achievement: 09/01/16 Potential to Achieve Goals: Good    Frequency Min 3X/week   Barriers to discharge        Co-evaluation               End of Session Equipment Utilized During Treatment: Gait belt Activity Tolerance: Patient tolerated treatment well Patient left: in chair;with call bell/phone within reach;with family/visitor present (alarm pad under pt; sitter present so not turned on) Nurse Communication: Mobility status;Other (comment) (alarm off w/ sitter to notify if she is leaving the room)         Time: [phone] PT Time Calculation (min) (ACUTE ONLY): 28 min   Charges:   PT Evaluation $PT Eval Low Complexity: 1 Procedure PT Treatments $Gait Training: 8-22 mins   PT G Codes:        Veleda Mun Sep 04, 2016, 10:46 AM Pager [phone]

## 2016-08-25 NOTE — Progress Notes (Signed)
Subjective: Patient markedly improved today. Patient is awake and engaging and doing a puzzle with her daughter. Family feels like she is back to her baseline.  Exam: Vitals:   08/25/16 0806 08/25/16 0900  BP: (!) 116/46 (!) 154/81  Pulse: 70 72  Resp: 14 16  Temp: 97.6 F (36.4 C)         Gen: In bed, NAD MS: Patient is awake and doing a puzzle with the daughter. She has baseline dementia. CN: 2-12 grossly intact Motor: Moving all extremities well Sensory: Grossly intact   Pertinent Labs/Diagnostics: LDL 112 Ammonia within normal limits    Impression: This is an 82 year old female with history of moderate to severe dementia presenting with delirium in the setting of a UTI. Patient is at this time back to her baseline. Suspect hypoactive delirium in the setting of UTI. Now resolved. Neurology will sign off at this time.  Etta Quill PA-C [facility] [phone]   08/25/2016, 10:58 AM

## 2016-08-25 NOTE — Care Management Note (Signed)
Case Management Note  Patient Details  Name: Michelle [name] MRN: [id number] Date of Birth: 08/25/1932  Subjective/Objective:         Adm w chb           Action/Plan:lives at home w 24/7 caregiver. Hx of [facility])   Expected Discharge Date:  08/23/16               Expected Discharge Plan:  [place]  In-House Referral:     Discharge planning Services  CM Consult  Post Acute Care Choice:  Home Health Choice offered to:  Adult Children, Patient  DME Arranged:    DME Agency:     HH Arranged:  RN, PT [place] Agency:  [facility] (now [facility])  Status of Service:  In process, will continue to follow  If discussed at Long Length of Stay Meetings, dates discussed:    Additional Comments:da would like hhc again and stated md said pt would need hhpt when disch. Tent hhcare ref for hhrn andhhpt arranged w [facility](formerly [facility]). They will ck to see about blue pads and adult diapers. Medicare does not cover these and are out of pocket for patient.  Lacretia Leigh, RN 08/25/2016, 2:37 PM

## 2016-08-25 NOTE — Progress Notes (Signed)
Patient discharged to private vehicle via wc. Daughter with patient. Patient with no complaints of pain. Daughter vebalizes understanding of d/c instructions.

## 2016-08-26 LAB — HEMOGLOBIN A1C
Hgb A1c MFr Bld: 6.4 % — ABNORMAL HIGH (ref 4.8–5.6)
MEAN PLASMA GLUCOSE: 137 mg/dL

## 2016-08-27 DIAGNOSIS — E119 Type 2 diabetes mellitus without complications: Secondary | ICD-10-CM | POA: Diagnosis not present

## 2016-08-27 DIAGNOSIS — N183 Chronic kidney disease, stage 3 (moderate): Secondary | ICD-10-CM | POA: Diagnosis not present

## 2016-08-27 DIAGNOSIS — B952 Enterococcus as the cause of diseases classified elsewhere: Secondary | ICD-10-CM | POA: Diagnosis not present

## 2016-08-27 DIAGNOSIS — I129 Hypertensive chronic kidney disease with stage 1 through stage 4 chronic kidney disease, or unspecified chronic kidney disease: Secondary | ICD-10-CM | POA: Diagnosis not present

## 2016-08-27 DIAGNOSIS — N390 Urinary tract infection, site not specified: Secondary | ICD-10-CM | POA: Diagnosis not present

## 2016-08-27 DIAGNOSIS — G309 Alzheimer's disease, unspecified: Secondary | ICD-10-CM | POA: Diagnosis not present

## 2016-08-28 DIAGNOSIS — M47817 Spondylosis without myelopathy or radiculopathy, lumbosacral region: Secondary | ICD-10-CM | POA: Diagnosis not present

## 2016-08-28 DIAGNOSIS — M1288 Other specific arthropathies, not elsewhere classified, other specified site: Secondary | ICD-10-CM | POA: Diagnosis not present

## 2016-08-28 DIAGNOSIS — M4696 Unspecified inflammatory spondylopathy, lumbar region: Secondary | ICD-10-CM | POA: Diagnosis not present

## 2016-08-28 DIAGNOSIS — M5137 Other intervertebral disc degeneration, lumbosacral region: Secondary | ICD-10-CM | POA: Diagnosis not present

## 2016-08-29 DIAGNOSIS — N183 Chronic kidney disease, stage 3 (moderate): Secondary | ICD-10-CM | POA: Diagnosis not present

## 2016-08-29 DIAGNOSIS — I129 Hypertensive chronic kidney disease with stage 1 through stage 4 chronic kidney disease, or unspecified chronic kidney disease: Secondary | ICD-10-CM | POA: Diagnosis not present

## 2016-08-29 DIAGNOSIS — G309 Alzheimer's disease, unspecified: Secondary | ICD-10-CM | POA: Diagnosis not present

## 2016-08-29 DIAGNOSIS — E119 Type 2 diabetes mellitus without complications: Secondary | ICD-10-CM | POA: Diagnosis not present

## 2016-08-29 DIAGNOSIS — B952 Enterococcus as the cause of diseases classified elsewhere: Secondary | ICD-10-CM | POA: Diagnosis not present

## 2016-08-29 DIAGNOSIS — N390 Urinary tract infection, site not specified: Secondary | ICD-10-CM | POA: Diagnosis not present

## 2016-09-02 DIAGNOSIS — G309 Alzheimer's disease, unspecified: Secondary | ICD-10-CM | POA: Diagnosis not present

## 2016-09-02 DIAGNOSIS — B952 Enterococcus as the cause of diseases classified elsewhere: Secondary | ICD-10-CM | POA: Diagnosis not present

## 2016-09-02 DIAGNOSIS — N390 Urinary tract infection, site not specified: Secondary | ICD-10-CM | POA: Diagnosis not present

## 2016-09-02 DIAGNOSIS — I129 Hypertensive chronic kidney disease with stage 1 through stage 4 chronic kidney disease, or unspecified chronic kidney disease: Secondary | ICD-10-CM | POA: Diagnosis not present

## 2016-09-02 DIAGNOSIS — E119 Type 2 diabetes mellitus without complications: Secondary | ICD-10-CM | POA: Diagnosis not present

## 2016-09-02 DIAGNOSIS — N183 Chronic kidney disease, stage 3 (moderate): Secondary | ICD-10-CM | POA: Diagnosis not present

## 2016-09-04 DIAGNOSIS — G309 Alzheimer's disease, unspecified: Secondary | ICD-10-CM | POA: Diagnosis not present

## 2016-09-04 DIAGNOSIS — N183 Chronic kidney disease, stage 3 (moderate): Secondary | ICD-10-CM | POA: Diagnosis not present

## 2016-09-04 DIAGNOSIS — I129 Hypertensive chronic kidney disease with stage 1 through stage 4 chronic kidney disease, or unspecified chronic kidney disease: Secondary | ICD-10-CM | POA: Diagnosis not present

## 2016-09-04 DIAGNOSIS — E119 Type 2 diabetes mellitus without complications: Secondary | ICD-10-CM | POA: Diagnosis not present

## 2016-09-04 DIAGNOSIS — N390 Urinary tract infection, site not specified: Secondary | ICD-10-CM | POA: Diagnosis not present

## 2016-09-04 DIAGNOSIS — B952 Enterococcus as the cause of diseases classified elsewhere: Secondary | ICD-10-CM | POA: Diagnosis not present

## 2016-09-05 ENCOUNTER — Ambulatory Visit (INDEPENDENT_AMBULATORY_CARE_PROVIDER_SITE_OTHER): Payer: Medicare Other | Admitting: Family Medicine

## 2016-09-05 ENCOUNTER — Encounter: Payer: Self-pay | Admitting: Family Medicine

## 2016-09-05 VITALS — BP 124/84 | HR 61 | Temp 98.6°F | Resp 14 | Ht 63.0 in | Wt 132.0 lb

## 2016-09-05 DIAGNOSIS — Z09 Encounter for follow-up examination after completed treatment for conditions other than malignant neoplasm: Secondary | ICD-10-CM | POA: Diagnosis not present

## 2016-09-05 DIAGNOSIS — R8290 Unspecified abnormal findings in urine: Secondary | ICD-10-CM | POA: Diagnosis not present

## 2016-09-05 DIAGNOSIS — Z79899 Other long term (current) drug therapy: Secondary | ICD-10-CM | POA: Diagnosis not present

## 2016-09-05 LAB — URINALYSIS, ROUTINE W REFLEX MICROSCOPIC
Bilirubin Urine: NEGATIVE
Glucose, UA: NEGATIVE
KETONES UR: NEGATIVE
NITRITE: NEGATIVE
Specific Gravity, Urine: 1.015 (ref 1.001–1.035)
pH: 7 (ref 5.0–8.0)

## 2016-09-05 LAB — URINALYSIS, MICROSCOPIC ONLY
Casts: NONE SEEN [LPF]
Crystals: NONE SEEN [HPF]
Yeast: NONE SEEN [HPF]

## 2016-09-05 MED ORDER — CEFUROXIME AXETIL 250 MG PO TABS: 250.0000 mg | ORAL_TABLET | Freq: Every day | ORAL | 5 refills | Status: DC

## 2016-09-05 MED ORDER — TELMISARTAN 40 MG PO TABS: 40.0000 mg | ORAL_TABLET | Freq: Every day | ORAL | 5 refills | Status: DC

## 2016-09-05 NOTE — Progress Notes (Signed)
Subjective:    Patient ID: Michelle [name], female    DOB: 12/28/1931, 82 y.o.   MRN: [id number]  HPI 05/2016 Since discharge from the hospital for uti, the patient's daughter reports continued occasional hematuria. It is only 1 or 2 drops in the toilet. Urinalysis today is negative. She is here to discuss follow-up with urology. She has not had a CT scan of the kidneys. She continues to suffer from dementia and her level of confusion waxes and wanes. She is overdue to check a hemoglobin A1c. She is also due to recheck her creatinine. Upon admission to the hospital, her creatinine was 2.8. At discharge it was 1.2. Her blood pressure today is low at 100/58. Her appetite waxes and wanes. At times she does not drink enough fluid. Daughter also reports soft stools that are poorly formed and lead to fecal incontinence but no overt diarrhea. This predates her hospital admission. The predates antibiotics.  At that time, my plan was: Patient has moderate to severe dementia. I had a long and frank discussion with the patient's daughter about a urology consultation for cystoscopy. We discussed whether this would add quality to the patient's wife. At times her delirium is severe. At the present time the daughter is not interested in a urology consultation for cystoscopy to look for bladder cancer unless the bleeding intensifies. We're worried that this could decrease the quality of life through treatment if bladder cancer was found. I will recheck a CBC, CMP, hemoglobin A1c, and TSH for the patient's ear. If her hemoglobin continues to drop, we may need to follow with urology due to blood loss. However I believe this is unlikely given the quantity of blood the daughter describes in the throat on occasion. I have also recommended discontinuation of hydrochlorothiazide as the patient has recently been dehydrated and her blood pressure is borderline low. The hydrochlorothiazide does not appear necessary.  Add a fiber  supplement as a stool bulking agent and also a probiotic to see if we can stop some of the fecal incontinence. If overt diarrhea develops, check the patient for C. Difficile.  08/14/16 Hga1c was 7.4 and acceptable at that time.  However recently, her fasting blood sugars have been between 303 50 in the mornings. Her evening blood sugars are between 180 and 250. She is currently on NPH 18 units in the morning and rapid acting insulin throughout the day as needed. Our goal hemoglobin A1c for this patient is greater than 7 and less than 8. I would like to try to maintain her sugars between 150 and 250. She is recently had several urinary tract infections as documented in her chart. Symptoms have returned again. Now she is more confused, more combative, more lethargic with a foul smell to her odor. Urinalysis shows 20-40 white blood cells, +3 leukocyte esterase.  At that time, my plan was: Urinalysis appears to show urinary tract infection again. Begin Bactrim 1 tablet by mouth twice a day for 7 days. I gave the family urine cups to have at home. We decided not to start a daily prophylactic antibiotic due to the risk of bacterial resistance. However if the patient develops symptoms, the family can bring in a urine sample so that we can check it in an effort to keep her from having to come back and forth to the doctor's office during flu season. Continue NPH but increased to 20 units in the morning and add 10 units in the evening. Recheck fasting and two-hour  postprandial sugars in one week.  09/05/16 Shortly thereafter, the patient was admitted to the hospital with complete heart block requiring temporary pacing family bradycardic. She was also found to be in acute renal failure with significant hyperkalemia with potassium of 6. Bradycardia was attributed to her history of bradycardia coupled with the Namenda coupled with hyperkalemia. Hyperkalemia was secondary to her acute renal failure which is likely due to a  combination of her ARB, urinary tract infection, the antibiotic Bactrim, and dehydration.  Hospital discontinued Namenda. They also discontinued hydrochlorothiazide and Micardis. Bradycardia improved. However since being home from the hospital she has had occasional bouts with a heart rate in the 40s. This is usually at night. Family is interested in pacemaker. However she is also having dark cloudy foul-smelling urine. Urinalysis today is significant for possible urinary tract infection. She continues to have recurrent urinary tract infections many requiring hospitalization. She just completed a round of antibiotics in the hospital and symptoms returned within 1 week. This usually leads to dehydration. I had a very long discussion today with the patient's daughter regarding her situation Past Medical History:  Diagnosis Date  . Arthritis   . Asthma   . COPD (chronic obstructive pulmonary disease) ([place])   . Coronary atherosclerosis   . Dyslipidemia   . Gallstones   . History of fractured kneecap   . Hypothyroidism   . Interstitial cystitis   . Malaise and fatigue   . Memory loss   . Polymyalgia rheumatica ([place])   . Systemic hypertension   . Type II or unspecified type diabetes mellitus without mention of complication, not stated as uncontrolled   . UTI (lower urinary tract infection)    Past Surgical History:  Procedure Laterality Date  . CARDIAC CATHETERIZATION  01/29/2006   10% luminal irregularity mid LAD  . CARDIAC CATHETERIZATION  03/15/2001   No significant CAD, no evidence of renal artery stenosis, systemic hypertenion  . CARDIAC CATHETERIZATION N/A 08/21/2016   Procedure: Temporary Pacemaker;  Surgeon: Jettie Booze, MD;  Location: [facility];  Service: Cardiovascular;  Laterality: N/A;  . CERVICAL SPINE SURGERY    . CHOLECYSTECTOMY    . HERNIA REPAIR    . LUMBAR DISC SURGERY    . NM MYOCAR PERF WALL MOTION  08/17/2009   normal  . ROTATOR CUFF REPAIR    .  TONSILLECTOMY    . TOTAL ABDOMINAL HYSTERECTOMY    . US ECHOCARDIOGRAPHY  09/14/2007   mild LVH, LA mildly dilated,mild mitral annular calcification, AOV mildly sclerotic, aortic root sclerosis/ca+   Current Outpatient Prescriptions on File Prior to Visit  Medication Sig Dispense Refill  . acetaminophen (TYLENOL) 325 MG tablet Take 2 tablets (650 mg total) by mouth every 6 (six) hours as needed for mild pain (or Fever >/= 101). 30 tablet 0  . aspirin 81 MG tablet Take 81 mg by mouth daily.    . Calcium Carbonate-Vit D-Min (CALCIUM 1200 PO) Take 1 tablet by mouth daily.     . cefixime (SUPRAX) 100 MG/5ML suspension Take 13 mLs (260 mg total) by mouth daily. 50 mL 0  . Cholecalciferol (VITAMIN D3) 2000 units TABS Take 2,000 Units by mouth daily.     . feeding supplement, ENSURE ENLIVE, (ENSURE ENLIVE) LIQD Take 237 mLs by mouth 2 (two) times daily between meals. (Patient taking differently: Take 237 mLs by mouth daily. ) 237 mL 12  . fentaNYL (DURAGESIC - DOSED MCG/HR) 25 MCG/HR patch Place 25 mcg onto the skin  every 3 (three) days.   0  . ferrous sulfate 325 (65 FE) MG tablet Take 325 mg by mouth daily with breakfast.    . HYDROcodone-acetaminophen (NORCO/VICODIN) 5-325 MG per tablet Take 1 tablet by mouth every 6 (six) hours as needed for moderate pain.     Marland Kitchen insulin lispro (HUMALOG) 100 UNIT/ML injection Inject 0-0.06 mLs (0-6 Units total) into the skin 3 (three) times daily as needed for high blood sugar. Patient uses sliding scale 10 mL 5  . insulin NPH Human (HUMULIN N,NOVOLIN N) 100 UNIT/ML injection Inject 0.18 mLs (18 Units total) into the skin every morning. (Patient taking differently: Inject 5-20 Units into the skin 2 (two) times daily. 20 units in the morning and 5 units in the evening) 10 mL 2  . Insulin Syringe-Needle U-100 31G X 5/16" 1 ML MISC For injection of insulin QID 100 each 5  . levothyroxine (SYNTHROID, LEVOTHROID) 100 MCG tablet TAKE 1 TABLET (100 MCG TOTAL) BY MOUTH DAILY.  90 tablet 1  . Multiple Vitamins-Minerals (OCUVITE PRESERVISION PO) Take 1 capsule by mouth daily.    . ONE TOUCH ULTRA TEST test strip TEST BS 5-6 TIMES A DAY 200 each 6  . predniSONE (DELTASONE) 5 MG tablet Take 1 tablet by mouth daily.  3  . psyllium (HYDROCIL/METAMUCIL) 95 % PACK Take 1 packet by mouth 3 (three) times daily.    . tamsulosin (FLOMAX) 0.4 MG CAPS capsule Take 1 capsule (0.4 mg total) by mouth daily after breakfast. 30 capsule 0  . tiotropium (SPIRIVA HANDIHALER) 18 MCG inhalation capsule Place 1 capsule (18 mcg total) into inhaler and inhale daily. 90 capsule 3  . UNABLE TO FIND Take 1 tablet by mouth daily. Med Name: probiotic capsule   Takes one daily.     . vitamin B-12 100 MCG tablet Take 1 tablet (100 mcg total) by mouth daily. 30 tablet 0   No current facility-administered medications on file prior to visit.    No Known Allergies Social History   Social History  . Marital status: Widowed    Spouse name: N/A  . Number of children: 4  . Years of education: 12   Occupational History  . retired Retired   Social History Main Topics  . Smoking status: Never Smoker  . Smokeless tobacco: Never Used  . Alcohol use No  . Drug use: No  . Sexual activity: Not on file   Other Topics Concern  . Not on file   Social History Narrative   Patient lives at home alone. Widowed.   Retired.   Education High school   Right handed   Caffeine- some times coffee and soda one cup.     Review of Systems  All other systems reviewed and are negative.      Objective:   Physical Exam  Cardiovascular: Normal rate, regular rhythm and normal heart sounds.   Pulmonary/Chest: Effort normal and breath sounds normal. No respiratory distress. She has no wheezes. She has no rales.  Abdominal: Soft. Bowel sounds are normal. She exhibits no distension. There is no tenderness. There is no rebound and no guarding.  Vitals reviewed.         Assessment & Plan:  Abnormal urine  odor - Plan: Urinalysis, Routine w reflex microscopic (not at [facility]), Urine culture  Hospital discharge follow-up - Plan: BASIC METABOLIC PANEL WITH GFR, CBC with Differential/Platelet  Patient appears to have urinary tract infection. I want him to begin Ceftin 250 mg by mouth  twice a day for 1 week. I then suggested that she remain on Ceftin 250 mg daily thereafter. I explained that this increases the risk of resistant urinary tract infections. Also explained that she may develop C. difficile colitis. However she appears to keep urinary tract infections with frequent cause delirium and hospitalization and therefore the family believes the benefit outweighs the risk. We will not treat her blood pressure. Even though it is extremely high at times will only treat her blood pressure on an as-needed basis with telmisartan and she will take 40 mg a day as needed for systolic blood pressure greater than 180. Otherwise we will ignore her blood pressure and focus on quality of life. I explained to the patient and her family that I believe her life expectancy is less than 12-18 months. This is due to a combination of her recurrent urinary tract infections, her cardiovascular problems, and her dementia. Therefore I recommended against pacemaker unless she has profound persistent symptomatic bradycardia. Instead I believe we need to focus her goals of care on quality of life. The daughter is comfortable with this. Therefore we will not pursue pacemaker unless she becomes symptomatic.

## 2016-09-06 LAB — CBC WITH DIFFERENTIAL/PLATELET
Basophils Absolute: 0 cells/uL (ref 0–200)
Basophils Relative: 0 %
EOS PCT: 2 %
Eosinophils Absolute: 146 cells/uL (ref 15–500)
HCT: 32.7 % — ABNORMAL LOW (ref 35.0–45.0)
Hemoglobin: 10.7 g/dL — ABNORMAL LOW (ref 12.0–15.0)
LYMPHS PCT: 30 %
Lymphs Abs: 2190 cells/uL (ref 850–3900)
MCH: 31 pg (ref 27.0–33.0)
MCHC: 32.7 g/dL (ref 32.0–36.0)
MCV: 94.8 fL (ref 80.0–100.0)
MONOS PCT: 8 %
MPV: 10.1 fL (ref 7.5–12.5)
Monocytes Absolute: 584 cells/uL (ref 200–950)
NEUTROS PCT: 60 %
Neutro Abs: 4380 cells/uL (ref 1500–7800)
Platelets: 277 10*3/uL (ref 140–400)
RBC: 3.45 MIL/uL — AB (ref 3.80–5.10)
RDW: 14.5 % (ref 11.0–15.0)
WBC: 7.3 10*3/uL (ref 3.8–10.8)

## 2016-09-06 LAB — BASIC METABOLIC PANEL WITH GFR
BUN: 25 mg/dL (ref 7–25)
CALCIUM: 8.7 mg/dL (ref 8.6–10.4)
CO2: 26 mmol/L (ref 20–31)
CREATININE: 1.41 mg/dL — AB (ref 0.60–0.88)
Chloride: 102 mmol/L (ref 98–110)
GFR, EST AFRICAN AMERICAN: 40 mL/min — AB (ref >=60)
GFR, Est Non African American: 35 mL/min — ABNORMAL LOW (ref >=60)
Glucose, Bld: 49 mg/dL — ABNORMAL LOW (ref 70–99)
POTASSIUM: 5.5 mmol/L — AB (ref 3.5–5.3)
Sodium: 135 mmol/L (ref 135–146)

## 2016-09-08 LAB — URINE CULTURE

## 2016-09-09 DIAGNOSIS — N390 Urinary tract infection, site not specified: Secondary | ICD-10-CM | POA: Diagnosis not present

## 2016-09-09 DIAGNOSIS — G309 Alzheimer's disease, unspecified: Secondary | ICD-10-CM | POA: Diagnosis not present

## 2016-09-09 DIAGNOSIS — N183 Chronic kidney disease, stage 3 (moderate): Secondary | ICD-10-CM | POA: Diagnosis not present

## 2016-09-09 DIAGNOSIS — I129 Hypertensive chronic kidney disease with stage 1 through stage 4 chronic kidney disease, or unspecified chronic kidney disease: Secondary | ICD-10-CM | POA: Diagnosis not present

## 2016-09-09 DIAGNOSIS — B952 Enterococcus as the cause of diseases classified elsewhere: Secondary | ICD-10-CM | POA: Diagnosis not present

## 2016-09-09 DIAGNOSIS — E119 Type 2 diabetes mellitus without complications: Secondary | ICD-10-CM | POA: Diagnosis not present

## 2016-09-11 ENCOUNTER — Other Ambulatory Visit: Payer: Self-pay | Admitting: Family Medicine

## 2016-09-11 DIAGNOSIS — E875 Hyperkalemia: Secondary | ICD-10-CM

## 2016-09-11 DIAGNOSIS — G309 Alzheimer's disease, unspecified: Secondary | ICD-10-CM | POA: Diagnosis not present

## 2016-09-11 DIAGNOSIS — N183 Chronic kidney disease, stage 3 (moderate): Secondary | ICD-10-CM | POA: Diagnosis not present

## 2016-09-11 DIAGNOSIS — R944 Abnormal results of kidney function studies: Secondary | ICD-10-CM

## 2016-09-11 DIAGNOSIS — B952 Enterococcus as the cause of diseases classified elsewhere: Secondary | ICD-10-CM | POA: Diagnosis not present

## 2016-09-11 DIAGNOSIS — I129 Hypertensive chronic kidney disease with stage 1 through stage 4 chronic kidney disease, or unspecified chronic kidney disease: Secondary | ICD-10-CM | POA: Diagnosis not present

## 2016-09-11 DIAGNOSIS — N390 Urinary tract infection, site not specified: Secondary | ICD-10-CM | POA: Diagnosis not present

## 2016-09-11 DIAGNOSIS — E119 Type 2 diabetes mellitus without complications: Secondary | ICD-10-CM | POA: Diagnosis not present

## 2016-09-11 MED ORDER — CIPROFLOXACIN HCL 250 MG PO TABS: 250.0000 mg | ORAL_TABLET | Freq: Two times a day (BID) | ORAL | 0 refills | Status: DC

## 2016-09-16 DIAGNOSIS — N390 Urinary tract infection, site not specified: Secondary | ICD-10-CM | POA: Diagnosis not present

## 2016-09-16 DIAGNOSIS — G309 Alzheimer's disease, unspecified: Secondary | ICD-10-CM | POA: Diagnosis not present

## 2016-09-16 DIAGNOSIS — N183 Chronic kidney disease, stage 3 (moderate): Secondary | ICD-10-CM | POA: Diagnosis not present

## 2016-09-16 DIAGNOSIS — I129 Hypertensive chronic kidney disease with stage 1 through stage 4 chronic kidney disease, or unspecified chronic kidney disease: Secondary | ICD-10-CM | POA: Diagnosis not present

## 2016-09-16 DIAGNOSIS — B952 Enterococcus as the cause of diseases classified elsewhere: Secondary | ICD-10-CM | POA: Diagnosis not present

## 2016-09-16 DIAGNOSIS — E119 Type 2 diabetes mellitus without complications: Secondary | ICD-10-CM | POA: Diagnosis not present

## 2016-09-17 ENCOUNTER — Ambulatory Visit (INDEPENDENT_AMBULATORY_CARE_PROVIDER_SITE_OTHER): Payer: Medicare Other | Admitting: Ophthalmology

## 2016-09-18 DIAGNOSIS — B952 Enterococcus as the cause of diseases classified elsewhere: Secondary | ICD-10-CM | POA: Diagnosis not present

## 2016-09-18 DIAGNOSIS — I129 Hypertensive chronic kidney disease with stage 1 through stage 4 chronic kidney disease, or unspecified chronic kidney disease: Secondary | ICD-10-CM | POA: Diagnosis not present

## 2016-09-18 DIAGNOSIS — E119 Type 2 diabetes mellitus without complications: Secondary | ICD-10-CM | POA: Diagnosis not present

## 2016-09-18 DIAGNOSIS — N183 Chronic kidney disease, stage 3 (moderate): Secondary | ICD-10-CM | POA: Diagnosis not present

## 2016-09-18 DIAGNOSIS — G309 Alzheimer's disease, unspecified: Secondary | ICD-10-CM | POA: Diagnosis not present

## 2016-09-18 DIAGNOSIS — N390 Urinary tract infection, site not specified: Secondary | ICD-10-CM | POA: Diagnosis not present

## 2016-09-22 ENCOUNTER — Encounter: Payer: Self-pay | Admitting: Physician Assistant

## 2016-09-22 ENCOUNTER — Ambulatory Visit (INDEPENDENT_AMBULATORY_CARE_PROVIDER_SITE_OTHER): Payer: Medicare Other | Admitting: Physician Assistant

## 2016-09-22 VITALS — BP 120/82 | HR 66 | Temp 97.8°F | Resp 16 | Wt 110.0 lb

## 2016-09-22 DIAGNOSIS — G301 Alzheimer's disease with late onset: Secondary | ICD-10-CM

## 2016-09-22 DIAGNOSIS — F0281 Dementia in other diseases classified elsewhere with behavioral disturbance: Secondary | ICD-10-CM | POA: Diagnosis not present

## 2016-09-22 DIAGNOSIS — N183 Chronic kidney disease, stage 3 (moderate): Secondary | ICD-10-CM | POA: Diagnosis not present

## 2016-09-22 DIAGNOSIS — N179 Acute kidney failure, unspecified: Secondary | ICD-10-CM

## 2016-09-22 DIAGNOSIS — N3090 Cystitis, unspecified without hematuria: Secondary | ICD-10-CM | POA: Diagnosis not present

## 2016-09-22 DIAGNOSIS — F02818 Dementia in other diseases classified elsewhere, unspecified severity, with other behavioral disturbance: Secondary | ICD-10-CM

## 2016-09-22 LAB — URINALYSIS, ROUTINE W REFLEX MICROSCOPIC
BILIRUBIN URINE: NEGATIVE
HGB URINE DIPSTICK: NEGATIVE
KETONES UR: NEGATIVE
Leukocytes, UA: NEGATIVE
NITRITE: NEGATIVE
Specific Gravity, Urine: 1.015 (ref 1.001–1.035)
pH: 6 (ref 5.0–8.0)

## 2016-09-22 LAB — URINALYSIS, MICROSCOPIC ONLY
CASTS: NONE SEEN [LPF]
CRYSTALS: NONE SEEN [HPF]
Yeast: NONE SEEN [HPF]

## 2016-09-22 NOTE — Progress Notes (Signed)
Patient ID: Michelle [name] MRN: [id number], DOB: Jan 08, 1932, 82 y.o. Date of Encounter: @DATE @  Chief Complaint:  Chief Complaint  Patient presents with  . [facility] Blood Work      .   . frequent bladder infection    HPI: 82 y.o. year old female  presents with her hired caregiver for [place] today.  Today I have reviewed Dr. Samella Parr office note dated 09/05/2016. That note includes information from recent visits with her 05/2016, 08/14/16, 09/05/16.  Also reviewed the urinalysis performed 09/05/16. I reviewed those culture results. That culture showed that that infection was resistant to cephalosporins so he changed antibiotic to Cipro.  Also reviewed that labs drawn at 09/05/16 showed the potassium was up and kidney function was a little worse since leaving the hospital. Dr. Dennard Schaumann felt that lab indicated some mild dehydration and recommend that she increase fluids and to recheck in one week.  Today this hired caregiver states that she is with the patient 24 hours a day during the week Monday through Friday. Says that the family is with the patient on the weekend. She states that the family reported that patient had been agitated over the weekend. They were uncertain whether this was just secondary to her dementia or secondary to another UTI. She also states that the family started Pedialyte yesterday as far as trying to increase hydration. This caregiver says that since she has been back with patient this morning, patient seemed fine---says her behavior has been stable, says she ate pancake and bacon, drank a Pedialyte. Says that she has had no BM today. Says that she has seemed stable since she has been back with her today.   Past Medical History:  Diagnosis Date  . Arthritis   . Asthma   . COPD (chronic obstructive pulmonary disease) ([place])   . Coronary atherosclerosis   . Dyslipidemia   . Gallstones   . History of fractured kneecap   . Hypothyroidism   . Interstitial cystitis   .  Malaise and fatigue   . Memory loss   . Polymyalgia rheumatica ([place])   . Systemic hypertension   . Type II or unspecified type diabetes mellitus without mention of complication, not stated as uncontrolled   . UTI (lower urinary tract infection)      Home Meds: Outpatient Medications Prior to Visit  Medication Sig Dispense Refill  . acetaminophen (TYLENOL) 325 MG tablet Take 2 tablets (650 mg total) by mouth every 6 (six) hours as needed for mild pain (or Fever >/= 101). 30 tablet 0  . aspirin 81 MG tablet Take 81 mg by mouth daily.    . Calcium Carbonate-Vit D-Min (CALCIUM 1200 PO) Take 1 tablet by mouth daily.     . cefixime (SUPRAX) 100 MG/5ML suspension Take 13 mLs (260 mg total) by mouth daily. 50 mL 0  . cefUROXime (CEFTIN) 250 MG tablet Take 1 tablet (250 mg total) by mouth daily. 30 tablet 5  . Cholecalciferol (VITAMIN D3) 2000 units TABS Take 2,000 Units by mouth daily.     . ciprofloxacin (CIPRO) 250 MG tablet Take 1 tablet (250 mg total) by mouth 2 (two) times daily. 14 tablet 0  . feeding supplement, ENSURE ENLIVE, (ENSURE ENLIVE) LIQD Take 237 mLs by mouth 2 (two) times daily between meals. (Patient taking differently: Take 237 mLs by mouth daily. ) 237 mL 12  . fentaNYL (DURAGESIC - DOSED MCG/HR) 25 MCG/HR patch Place 25 mcg onto the skin every 3 (three) days.  0  . ferrous sulfate 325 (65 FE) MG tablet Take 325 mg by mouth daily with breakfast.    . HYDROcodone-acetaminophen (NORCO/VICODIN) 5-325 MG per tablet Take 1 tablet by mouth every 6 (six) hours as needed for moderate pain.     Marland Kitchen insulin lispro (HUMALOG) 100 UNIT/ML injection Inject 0-0.06 mLs (0-6 Units total) into the skin 3 (three) times daily as needed for high blood sugar. Patient uses sliding scale 10 mL 5  . insulin NPH Human (HUMULIN N,NOVOLIN N) 100 UNIT/ML injection Inject 0.18 mLs (18 Units total) into the skin every morning. (Patient taking differently: Inject 5-20 Units into the skin 2 (two) times daily. 20  units in the morning and 5 units in the evening) 10 mL 2  . Insulin Syringe-Needle U-100 31G X 5/16" 1 ML MISC For injection of insulin QID 100 each 5  . levothyroxine (SYNTHROID, LEVOTHROID) 100 MCG tablet TAKE 1 TABLET (100 MCG TOTAL) BY MOUTH DAILY. 90 tablet 1  . Multiple Vitamins-Minerals (OCUVITE PRESERVISION PO) Take 1 capsule by mouth daily.    . ONE TOUCH ULTRA TEST test strip TEST BS 5-6 TIMES A DAY 200 each 6  . predniSONE (DELTASONE) 5 MG tablet Take 1 tablet by mouth daily.  3  . psyllium (HYDROCIL/METAMUCIL) 95 % PACK Take 1 packet by mouth 3 (three) times daily.    . tamsulosin (FLOMAX) 0.4 MG CAPS capsule Take 1 capsule (0.4 mg total) by mouth daily after breakfast. 30 capsule 0  . telmisartan (MICARDIS) 40 MG tablet Take 1 tablet (40 mg total) by mouth daily. 30 tablet 5  . tiotropium (SPIRIVA HANDIHALER) 18 MCG inhalation capsule Place 1 capsule (18 mcg total) into inhaler and inhale daily. 90 capsule 3  . UNABLE TO FIND Take 1 tablet by mouth daily. Med Name: probiotic capsule   Takes one daily.     . vitamin B-12 100 MCG tablet Take 1 tablet (100 mcg total) by mouth daily. 30 tablet 0   No facility-administered medications prior to visit.     Allergies: No Known Allergies  Social History   Social History  . Marital status: Widowed    Spouse name: N/A  . Number of children: 4  . Years of education: 12   Occupational History  . retired Retired   Social History Main Topics  . Smoking status: Never Smoker  . Smokeless tobacco: Never Used  . Alcohol use No  . Drug use: No  . Sexual activity: Not on file   Other Topics Concern  . Not on file   Social History Narrative   Patient lives at home alone. Widowed.   Retired.   Education High school   Right handed   Caffeine- some times coffee and soda one cup.    Family History  Problem Relation Age of Onset  . Breast cancer Mother   . Heart disease Father   . Lung cancer Brother      Review of Systems:   See HPI for pertinent ROS. All other ROS negative.    Physical Exam: Blood pressure 120/82, pulse 66, temperature 97.8 F (36.6 C), temperature source Oral, resp. rate 16, weight 110 lb (49.9 kg), SpO2 90 %., Body mass index is 19.49 kg/m. General: WNWD WF. Walks with a walker. Appears in no acute distress. Neck: Supple. No thyromegaly. No lymphadenopathy. Lungs: Clear bilaterally to auscultation without wheezes, rales, or rhonchi. Breathing is unlabored. Heart: RRR with S1 S2. No murmurs, rubs, or gallops. Abdomen: Soft, non-tender, non-distended  with normoactive bowel sounds. No hepatomegaly. No rebound/guarding. No obvious abdominal masses. Musculoskeletal:  Strength and tone normal for age. Extremities/Skin: Warm and dry. Neuro: Alert and oriented X 3. Moves all extremities spontaneously. Gait is normal. CNII-XII grossly in tact. Psych:  Responds to questions appropriately with a normal affect.   Results for orders placed or performed in visit on 09/22/16  Urinalysis, Routine w reflex microscopic (not at [facility])  Result Value Ref Range   Color, Urine YELLOW YELLOW   APPearance CLEAR CLEAR   Specific Gravity, Urine 1.015 1.001 - 1.035   pH 6.0 5.0 - 8.0   Glucose, UA TRACE (A) NEGATIVE   Bilirubin Urine NEGATIVE NEGATIVE   Ketones, ur NEGATIVE NEGATIVE   Hgb urine dipstick NEGATIVE NEGATIVE   Protein, ur 2+ (A) NEGATIVE   Nitrite NEGATIVE NEGATIVE   Leukocytes, UA NEGATIVE NEGATIVE  Urine Microscopic  Result Value Ref Range   WBC, UA 0-5 <=5 WBC/HPF   RBC / HPF 0-2 <=2 RBC/HPF   Squamous Epithelial / LPF 0-5 <=5 HPF   Bacteria, UA FEW (A) NONE SEEN HPF   Crystals NONE SEEN NONE SEEN HPF   Casts NONE SEEN NONE SEEN LPF   Yeast NONE SEEN NONE SEEN HPF     ASSESSMENT AND PLAN:  82 y.o. year old female with  1. Recurrent UTI 2. Behavior disturbance----? Secondary to another UTI vs secondary to Dementia --UA does not show infection. Will send Culture to f/u as well.  -  Urinalysis, Routine w reflex microscopic (not at [facility]) - Urine culture  2. Late onset Alzheimer's disease with behavioral disturbance --See # 1 above  3. Acute renal failure superimposed on stage 3 chronic kidney disease, unspecified acute renal failure type ([place]) Lab 09/05/16 showed some slight worsening so at that time Dr. Dennard Schaumann recommended to increase hydration and recheck lab one week. Will recheck lab now to monitor. - BASIC METABOLIC PANEL WITH GFR   Signed, [address] [place], [place], [facility] 09/22/2016 3:15 PM

## 2016-09-23 DIAGNOSIS — N390 Urinary tract infection, site not specified: Secondary | ICD-10-CM | POA: Diagnosis not present

## 2016-09-23 DIAGNOSIS — I129 Hypertensive chronic kidney disease with stage 1 through stage 4 chronic kidney disease, or unspecified chronic kidney disease: Secondary | ICD-10-CM | POA: Diagnosis not present

## 2016-09-23 DIAGNOSIS — G309 Alzheimer's disease, unspecified: Secondary | ICD-10-CM | POA: Diagnosis not present

## 2016-09-23 DIAGNOSIS — B952 Enterococcus as the cause of diseases classified elsewhere: Secondary | ICD-10-CM | POA: Diagnosis not present

## 2016-09-23 DIAGNOSIS — E119 Type 2 diabetes mellitus without complications: Secondary | ICD-10-CM | POA: Diagnosis not present

## 2016-09-23 DIAGNOSIS — N183 Chronic kidney disease, stage 3 (moderate): Secondary | ICD-10-CM | POA: Diagnosis not present

## 2016-09-23 LAB — BASIC METABOLIC PANEL WITH GFR
BUN: 24 mg/dL (ref 7–25)
CALCIUM: 9 mg/dL (ref 8.6–10.4)
CHLORIDE: 98 mmol/L (ref 98–110)
CO2: 27 mmol/L (ref 20–31)
CREATININE: 1.21 mg/dL — AB (ref 0.60–0.88)
GFR, EST AFRICAN AMERICAN: 48 mL/min — AB (ref >=60)
GFR, Est Non African American: 42 mL/min — ABNORMAL LOW (ref >=60)
Glucose, Bld: 179 mg/dL — ABNORMAL HIGH (ref 70–99)
Potassium: 4.6 mmol/L (ref 3.5–5.3)
SODIUM: 134 mmol/L — AB (ref 135–146)

## 2016-09-24 LAB — URINE CULTURE: Organism ID, Bacteria: NO GROWTH

## 2016-09-25 ENCOUNTER — Ambulatory Visit (HOSPITAL_COMMUNITY)
Admission: EM | Admit: 2016-09-25 | Discharge: 2016-09-25 | Disposition: A | Discharge status: Home / Self Care | Payer: Medicare Other | Attending: Emergency Medicine | Admitting: Emergency Medicine

## 2016-09-25 ENCOUNTER — Ambulatory Visit (INDEPENDENT_AMBULATORY_CARE_PROVIDER_SITE_OTHER): Payer: Medicare Other

## 2016-09-25 ENCOUNTER — Ambulatory Visit (HOSPITAL_COMMUNITY): Payer: Medicare Other

## 2016-09-25 ENCOUNTER — Encounter (HOSPITAL_COMMUNITY): Payer: Self-pay | Admitting: Emergency Medicine

## 2016-09-25 DIAGNOSIS — R05 Cough: Secondary | ICD-10-CM

## 2016-09-25 DIAGNOSIS — J9801 Acute bronchospasm: Secondary | ICD-10-CM

## 2016-09-25 DIAGNOSIS — J40 Bronchitis, not specified as acute or chronic: Secondary | ICD-10-CM

## 2016-09-25 DIAGNOSIS — R109 Unspecified abdominal pain: Secondary | ICD-10-CM | POA: Diagnosis not present

## 2016-09-25 DIAGNOSIS — M545 Low back pain: Secondary | ICD-10-CM | POA: Diagnosis not present

## 2016-09-25 DIAGNOSIS — R059 Cough, unspecified: Secondary | ICD-10-CM

## 2016-09-25 DIAGNOSIS — M47816 Spondylosis without myelopathy or radiculopathy, lumbar region: Secondary | ICD-10-CM | POA: Diagnosis not present

## 2016-09-25 DIAGNOSIS — G894 Chronic pain syndrome: Secondary | ICD-10-CM | POA: Diagnosis not present

## 2016-09-25 MED ORDER — AZITHROMYCIN 250 MG PO TABS: ORAL_TABLET | ORAL | 0 refills | Status: DC

## 2016-09-25 MED ORDER — ALBUTEROL SULFATE HFA 108 (90 BASE) MCG/ACT IN AERS: 1.0000 | INHALATION_SPRAY | RESPIRATORY_TRACT | 0 refills | Status: DC | PRN

## 2016-09-25 NOTE — Discharge Instructions (Signed)
Use the albuterol inhaler once to 2 puffs every 4-6 hours as needed for cough and wheeze. Continue using the Spiriva as you usually have been. Start taking the azithromycin as directed. If you develop fever, chills, shortness of breath, trouble breathing, chest pain or other problems see your doctor or go to the emergency department promptly.

## 2016-09-25 NOTE — ED Triage Notes (Signed)
The patient presented to the [facility] with family with a complaint of a cough x 2 weeks. The patient's hx came from family as the patient is reported to have Alzheimer's Disease. The patient's daughter reported that she has had a cough that has been treated with OTC meds that have not helped. They reported that they were at the pain clinic and the physician stated that she "had a rattle in her chest" and needed and xray and referred the patient to the [facility].

## 2016-09-25 NOTE — ED Provider Notes (Signed)
CSN: [id number]     Arrival date & time 09/25/16  1530 History   First MD Initiated Contact with Patient 09/25/16 1858     Chief Complaint  Patient presents with  . Cough   (Consider location/radiation/quality/duration/timing/severity/associated sxs/prior Treatment) 82 year old female was with her family at the pain clinic tonight when it was noticed that she had some rattling in her chest. This was heard by the nurse and was advised that she been having a cough for 2 weeks. Other associated symptoms include runny nose and sneezing and possibly PND. The cough has been nonproductive. She has had no fever. She was advised to go to the urgent care for evaluation of the rattling in the chest. Patient states she feels generally well and currently with no fever or shortness of breath. She has a history of COPD, asthma, type 2 diabetes mellitus and hypertension.      Past Medical History:  Diagnosis Date  . Arthritis   . Asthma   . COPD (chronic obstructive pulmonary disease) ([place])   . Coronary atherosclerosis   . Dyslipidemia   . Gallstones   . History of fractured kneecap   . Hypothyroidism   . Interstitial cystitis   . Malaise and fatigue   . Memory loss   . Polymyalgia rheumatica ([place])   . Systemic hypertension   . Type II or unspecified type diabetes mellitus without mention of complication, not stated as uncontrolled   . UTI (lower urinary tract infection)    Past Surgical History:  Procedure Laterality Date  . CARDIAC CATHETERIZATION  01/29/2006   10% luminal irregularity mid LAD  . CARDIAC CATHETERIZATION  03/15/2001   No significant CAD, no evidence of renal artery stenosis, systemic hypertenion  . CARDIAC CATHETERIZATION N/A 08/21/2016   Procedure: Temporary Pacemaker;  Surgeon: Jettie Booze, MD;  Location: [facility];  Service: Cardiovascular;  Laterality: N/A;  . CERVICAL SPINE SURGERY    . CHOLECYSTECTOMY    . HERNIA REPAIR    . LUMBAR DISC SURGERY    .  NM MYOCAR PERF WALL MOTION  08/17/2009   normal  . ROTATOR CUFF REPAIR    . TONSILLECTOMY    . TOTAL ABDOMINAL HYSTERECTOMY    . US ECHOCARDIOGRAPHY  09/14/2007   mild LVH, LA mildly dilated,mild mitral annular calcification, AOV mildly sclerotic, aortic root sclerosis/ca+   Family History  Problem Relation Age of Onset  . Breast cancer Mother   . Heart disease Father   . Lung cancer Brother    Social History  Substance Use Topics  . Smoking status: Never Smoker  . Smokeless tobacco: Never Used  . Alcohol use No   OB History    No data available     Review of Systems  Constitutional: Negative.  Negative for fatigue.  HENT: Positive for congestion and rhinorrhea. Negative for ear pain.   Respiratory: Positive for cough and shortness of breath. Negative for chest tightness.   Gastrointestinal: Negative.   Genitourinary: Negative.   Skin: Negative.   Neurological: Negative.     Allergies  Patient has no known allergies.  Home Medications   Prior to Admission medications   Medication Sig Start Date End Date Taking? Authorizing Provider  aspirin 81 MG tablet Take 81 mg by mouth daily.   Yes Historical Provider, MD  Calcium Carbonate-Vit D-Min (CALCIUM 1200 PO) Take 1 tablet by mouth daily.    Yes Historical Provider, MD  cefUROXime (CEFTIN) 250 MG tablet Take 1 tablet (250  mg total) by mouth daily. 09/05/16  Yes Susy Frizzle, MD  Cholecalciferol (VITAMIN D3) 2000 units TABS Take 2,000 Units by mouth daily.    Yes Historical Provider, MD  ciprofloxacin (CIPRO) 250 MG tablet Take 1 tablet (250 mg total) by mouth 2 (two) times daily. 09/11/16  Yes Susy Frizzle, MD  feeding supplement, ENSURE ENLIVE, (ENSURE ENLIVE) LIQD Take 237 mLs by mouth 2 (two) times daily between meals. Patient taking differently: Take 237 mLs by mouth daily.  04/26/16  Yes Nishant Dhungel, MD  fentaNYL (DURAGESIC - DOSED MCG/HR) 25 MCG/HR patch Place 25 mcg onto the skin every 3 (three) days.   05/26/16  Yes Historical Provider, MD  HYDROcodone-acetaminophen (NORCO/VICODIN) 5-325 MG per tablet Take 1 tablet by mouth every 6 (six) hours as needed for moderate pain.    Yes Historical Provider, MD  insulin lispro (HUMALOG) 100 UNIT/ML injection Inject 0-0.06 mLs (0-6 Units total) into the skin 3 (three) times daily as needed for high blood sugar. Patient uses sliding scale 05/26/16  Yes Susy Frizzle, MD  insulin NPH Human (HUMULIN N,NOVOLIN N) 100 UNIT/ML injection Inject 0.18 mLs (18 Units total) into the skin every morning. Patient taking differently: Inject 5-20 Units into the skin 2 (two) times daily. 20 units in the morning and 5 units in the evening 05/26/16  Yes Susy Frizzle, MD  Insulin Syringe-Needle U-100 31G X 5/16" 1 ML MISC For injection of insulin QID 07/29/16  Yes Susy Frizzle, MD  levothyroxine (SYNTHROID, LEVOTHROID) 100 MCG tablet TAKE 1 TABLET (100 MCG TOTAL) BY MOUTH DAILY. 01/28/16  Yes Susy Frizzle, MD  Multiple Vitamins-Minerals (OCUVITE PRESERVISION PO) Take 1 capsule by mouth daily.   Yes Historical Provider, MD  ONE TOUCH ULTRA TEST test strip TEST BS 5-6 TIMES A DAY 12/27/15  Yes Susy Frizzle, MD  predniSONE (DELTASONE) 5 MG tablet Take 1 tablet by mouth daily. 04/15/16  Yes Historical Provider, MD  psyllium (HYDROCIL/METAMUCIL) 95 % PACK Take 1 packet by mouth 3 (three) times daily.   Yes Historical Provider, MD  tamsulosin (FLOMAX) 0.4 MG CAPS capsule Take 1 capsule (0.4 mg total) by mouth daily after breakfast. 08/26/16  Yes Belkys A Regalado, MD  tiotropium (SPIRIVA HANDIHALER) 18 MCG inhalation capsule Place 1 capsule (18 mcg total) into inhaler and inhale daily. 03/17/16 02/26/18 Yes Deneise Lever, MD  vitamin B-12 100 MCG tablet Take 1 tablet (100 mcg total) by mouth daily. 08/26/16  Yes Belkys A Regalado, MD  acetaminophen (TYLENOL) 325 MG tablet Take 2 tablets (650 mg total) by mouth every 6 (six) hours as needed for mild pain (or Fever >/= 101).  08/25/16   Belkys A Regalado, MD  albuterol (PROVENTIL HFA;VENTOLIN HFA) 108 (90 Base) MCG/ACT inhaler Inhale 1-2 puffs into the lungs every 4 (four) hours as needed for wheezing or shortness of breath. 09/25/16   Janne Napoleon, NP  azithromycin (ZITHROMAX) 250 MG tablet 2 tabs po on day one, then one tablet po once daily on days 2-5. 09/25/16   Janne Napoleon, NP  cefixime (SUPRAX) 100 MG/5ML suspension Take 13 mLs (260 mg total) by mouth daily. 08/25/16   Belkys A Regalado, MD  ferrous sulfate 325 (65 FE) MG tablet Take 325 mg by mouth daily with breakfast.    Historical Provider, MD  telmisartan (MICARDIS) 40 MG tablet Take 1 tablet (40 mg total) by mouth daily. 09/05/16   Susy Frizzle, MD  UNABLE TO FIND Take 1 tablet  by mouth daily. Med Name: probiotic capsule   Takes one daily.     Historical Provider, MD   Meds Ordered and Administered this Visit  Medications - No data to display  BP 164/56 (BP Location: Right Arm)   Pulse 67   Temp 99.2 F (37.3 C) (Oral)   Resp 18   SpO2 99%  No data found.   Physical Exam  Constitutional: She is oriented to person, place, and time. She appears well-developed and well-nourished. No distress.  HENT:  Head: Normocephalic and atraumatic.  Bilateral TMs are normal. Oropharynx is clear and moist. No exudates.  Neck: Normal range of motion. Neck supple.  Cardiovascular: Normal rate, regular rhythm and normal heart sounds.   Pulmonary/Chest: Effort normal. No respiratory distress.  Bilateral, by basilar rattles/coarseness. Good chest expansion. No wheezes.  Musculoskeletal: She exhibits no edema or deformity.  Lymphadenopathy:    She has no cervical adenopathy.  Neurological: She is alert and oriented to person, place, and time.  Skin: Skin is warm and dry. She is not diaphoretic.  Psychiatric: She has a normal mood and affect.  Nursing note and vitals reviewed.   Urgent Care Course   Clinical Course     Procedures (including critical care  time)  Labs Review Labs Reviewed - No data to display  Imaging Review Dg Chest 1 View  Result Date: 09/25/2016 CLINICAL DATA:  Patient has been sick for 1 week with cough. EXAM: CHEST 1 VIEW COMPARISON:  08/23/2016 FINDINGS: The heart is top-normal in size. There is aortic atherosclerosis with mitral annular calcifications as well. Mild diffuse interstitial prominence is noted which may reflect bronchitic change. No pneumonic consolidations are noted. No effusion or pneumothorax. The patient is status post ACDF of the cervical spine. IMPRESSION: Mild diffuse interstitial prominence which may reflect bronchitic change. No pneumonic consolidation. No effusion. Aortic atherosclerosis. Electronically Signed   By: Ashley Royalty M.D.   On: 09/25/2016 18:36     Visual Acuity Review  Right Eye Distance:   Left Eye Distance:   Bilateral Distance:    Right Eye Near:   Left Eye Near:    Bilateral Near:         MDM   1. Cough   2. Bronchospasm   3. Bronchitis    Use the albuterol inhaler once to 2 puffs every 4-6 hours as needed for cough and wheeze. Continue using the Spiriva as you usually have been. Start taking the azithromycin as directed. If you develop fever, chills, shortness of breath, trouble breathing, chest pain or other problems see your doctor or go to the emergency department promptly. Meds ordered this encounter  Medications  . albuterol (PROVENTIL HFA;VENTOLIN HFA) 108 (90 Base) MCG/ACT inhaler    Sig: Inhale 1-2 puffs into the lungs every 4 (four) hours as needed for wheezing or shortness of breath.    Dispense:  1 Inhaler    Refill:  0    Order Specific Question:   Supervising Provider    Answer:   Melony Overly [id number]  . azithromycin (ZITHROMAX) 250 MG tablet    Sig: 2 tabs po on day one, then one tablet po once daily on days 2-5.    Dispense:  6 tablet    Refill:  0    Order Specific Question:   Supervising Provider    Answer:   Melony Overly [id number]        Janne Napoleon, NP 09/25/16 1942    Janne Napoleon, NP  09/25/16 1947  

## 2016-09-26 DIAGNOSIS — E119 Type 2 diabetes mellitus without complications: Secondary | ICD-10-CM | POA: Diagnosis not present

## 2016-09-26 DIAGNOSIS — I129 Hypertensive chronic kidney disease with stage 1 through stage 4 chronic kidney disease, or unspecified chronic kidney disease: Secondary | ICD-10-CM | POA: Diagnosis not present

## 2016-09-26 DIAGNOSIS — B952 Enterococcus as the cause of diseases classified elsewhere: Secondary | ICD-10-CM | POA: Diagnosis not present

## 2016-09-26 DIAGNOSIS — N183 Chronic kidney disease, stage 3 (moderate): Secondary | ICD-10-CM | POA: Diagnosis not present

## 2016-09-26 DIAGNOSIS — G309 Alzheimer's disease, unspecified: Secondary | ICD-10-CM | POA: Diagnosis not present

## 2016-09-26 DIAGNOSIS — N390 Urinary tract infection, site not specified: Secondary | ICD-10-CM | POA: Diagnosis not present

## 2016-09-28 ENCOUNTER — Emergency Department (HOSPITAL_COMMUNITY): Payer: Medicare Other

## 2016-09-28 ENCOUNTER — Encounter (HOSPITAL_COMMUNITY)
Admission: EM | Disposition: A | Discharge status: Home Health | Payer: Self-pay | Source: Home / Self Care | Attending: Internal Medicine

## 2016-09-28 ENCOUNTER — Inpatient Hospital Stay (HOSPITAL_COMMUNITY)
Admission: EM | Admit: 2016-09-28 | Discharge: 2016-10-04 | DRG: 242 | Disposition: A | Discharge status: Home Health | Payer: Medicare Other | Attending: Internal Medicine | Admitting: Internal Medicine

## 2016-09-28 ENCOUNTER — Encounter (HOSPITAL_COMMUNITY): Payer: Self-pay

## 2016-09-28 DIAGNOSIS — K7200 Acute and subacute hepatic failure without coma: Secondary | ICD-10-CM | POA: Diagnosis not present

## 2016-09-28 DIAGNOSIS — Z7982 Long term (current) use of aspirin: Secondary | ICD-10-CM

## 2016-09-28 DIAGNOSIS — J069 Acute upper respiratory infection, unspecified: Secondary | ICD-10-CM | POA: Diagnosis present

## 2016-09-28 DIAGNOSIS — Z66 Do not resuscitate: Secondary | ICD-10-CM | POA: Diagnosis present

## 2016-09-28 DIAGNOSIS — R778 Other specified abnormalities of plasma proteins: Secondary | ICD-10-CM

## 2016-09-28 DIAGNOSIS — F0280 Dementia in other diseases classified elsewhere without behavioral disturbance: Secondary | ICD-10-CM | POA: Diagnosis present

## 2016-09-28 DIAGNOSIS — R7989 Other specified abnormal findings of blood chemistry: Secondary | ICD-10-CM

## 2016-09-28 DIAGNOSIS — I129 Hypertensive chronic kidney disease with stage 1 through stage 4 chronic kidney disease, or unspecified chronic kidney disease: Secondary | ICD-10-CM | POA: Diagnosis present

## 2016-09-28 DIAGNOSIS — Z794 Long term (current) use of insulin: Secondary | ICD-10-CM | POA: Diagnosis not present

## 2016-09-28 DIAGNOSIS — R739 Hyperglycemia, unspecified: Secondary | ICD-10-CM

## 2016-09-28 DIAGNOSIS — Z950 Presence of cardiac pacemaker: Secondary | ICD-10-CM

## 2016-09-28 DIAGNOSIS — E785 Hyperlipidemia, unspecified: Secondary | ICD-10-CM | POA: Diagnosis present

## 2016-09-28 DIAGNOSIS — I10 Essential (primary) hypertension: Secondary | ICD-10-CM | POA: Diagnosis present

## 2016-09-28 DIAGNOSIS — Z79899 Other long term (current) drug therapy: Secondary | ICD-10-CM

## 2016-09-28 DIAGNOSIS — M1990 Unspecified osteoarthritis, unspecified site: Secondary | ICD-10-CM | POA: Diagnosis present

## 2016-09-28 DIAGNOSIS — N183 Chronic kidney disease, stage 3 (moderate): Secondary | ICD-10-CM | POA: Diagnosis present

## 2016-09-28 DIAGNOSIS — R570 Cardiogenic shock: Secondary | ICD-10-CM | POA: Diagnosis not present

## 2016-09-28 DIAGNOSIS — J9811 Atelectasis: Secondary | ICD-10-CM | POA: Diagnosis not present

## 2016-09-28 DIAGNOSIS — R748 Abnormal levels of other serum enzymes: Secondary | ICD-10-CM

## 2016-09-28 DIAGNOSIS — E039 Hypothyroidism, unspecified: Secondary | ICD-10-CM | POA: Diagnosis present

## 2016-09-28 DIAGNOSIS — R001 Bradycardia, unspecified: Secondary | ICD-10-CM | POA: Diagnosis present

## 2016-09-28 DIAGNOSIS — J440 Chronic obstructive pulmonary disease with acute lower respiratory infection: Secondary | ICD-10-CM | POA: Diagnosis present

## 2016-09-28 DIAGNOSIS — R112 Nausea with vomiting, unspecified: Secondary | ICD-10-CM | POA: Diagnosis not present

## 2016-09-28 DIAGNOSIS — Z79891 Long term (current) use of opiate analgesic: Secondary | ICD-10-CM

## 2016-09-28 DIAGNOSIS — I442 Atrioventricular block, complete: Secondary | ICD-10-CM | POA: Diagnosis not present

## 2016-09-28 DIAGNOSIS — R1110 Vomiting, unspecified: Secondary | ICD-10-CM

## 2016-09-28 DIAGNOSIS — I351 Nonrheumatic aortic (valve) insufficiency: Secondary | ICD-10-CM | POA: Diagnosis present

## 2016-09-28 DIAGNOSIS — I2510 Atherosclerotic heart disease of native coronary artery without angina pectoris: Secondary | ICD-10-CM | POA: Diagnosis present

## 2016-09-28 DIAGNOSIS — K5900 Constipation, unspecified: Secondary | ICD-10-CM | POA: Diagnosis not present

## 2016-09-28 DIAGNOSIS — I447 Left bundle-branch block, unspecified: Secondary | ICD-10-CM | POA: Diagnosis present

## 2016-09-28 DIAGNOSIS — E108 Type 1 diabetes mellitus with unspecified complications: Secondary | ICD-10-CM | POA: Diagnosis not present

## 2016-09-28 DIAGNOSIS — J9690 Respiratory failure, unspecified, unspecified whether with hypoxia or hypercapnia: Secondary | ICD-10-CM | POA: Diagnosis not present

## 2016-09-28 DIAGNOSIS — R945 Abnormal results of liver function studies: Secondary | ICD-10-CM

## 2016-09-28 DIAGNOSIS — J9600 Acute respiratory failure, unspecified whether with hypoxia or hypercapnia: Secondary | ICD-10-CM

## 2016-09-28 DIAGNOSIS — M353 Polymyalgia rheumatica: Secondary | ICD-10-CM | POA: Diagnosis present

## 2016-09-28 DIAGNOSIS — N170 Acute kidney failure with tubular necrosis: Secondary | ICD-10-CM | POA: Diagnosis present

## 2016-09-28 DIAGNOSIS — G309 Alzheimer's disease, unspecified: Secondary | ICD-10-CM | POA: Diagnosis present

## 2016-09-28 DIAGNOSIS — N179 Acute kidney failure, unspecified: Secondary | ICD-10-CM | POA: Diagnosis not present

## 2016-09-28 DIAGNOSIS — E1122 Type 2 diabetes mellitus with diabetic chronic kidney disease: Secondary | ICD-10-CM | POA: Diagnosis present

## 2016-09-28 DIAGNOSIS — E1165 Type 2 diabetes mellitus with hyperglycemia: Secondary | ICD-10-CM | POA: Diagnosis present

## 2016-09-28 DIAGNOSIS — R1311 Dysphagia, oral phase: Secondary | ICD-10-CM

## 2016-09-28 DIAGNOSIS — E875 Hyperkalemia: Secondary | ICD-10-CM | POA: Diagnosis present

## 2016-09-28 HISTORY — PX: CARDIAC CATHETERIZATION: SHX172

## 2016-09-28 LAB — URINE MICROSCOPIC-ADD ON: RBC / HPF: NONE SEEN RBC/hpf (ref 0–5)

## 2016-09-28 LAB — URINALYSIS, ROUTINE W REFLEX MICROSCOPIC
BILIRUBIN URINE: NEGATIVE
GLUCOSE, UA: 250 mg/dL — AB
HGB URINE DIPSTICK: NEGATIVE
Ketones, ur: NEGATIVE mg/dL
Leukocytes, UA: NEGATIVE
Nitrite: NEGATIVE
PH: 6 (ref 5.0–8.0)
Protein, ur: 100 mg/dL — AB
SPECIFIC GRAVITY, URINE: 1.016 (ref 1.005–1.030)

## 2016-09-28 LAB — COMPREHENSIVE METABOLIC PANEL
ALBUMIN: 3.6 g/dL (ref 3.5–5.0)
ALK PHOS: 115 U/L (ref 38–126)
ALT: 1118 U/L — AB (ref 14–54)
AST: 1969 U/L — ABNORMAL HIGH (ref 15–41)
Anion gap: 15 (ref 5–15)
BILIRUBIN TOTAL: 0.4 mg/dL (ref 0.3–1.2)
BUN: 43 mg/dL — ABNORMAL HIGH (ref 6–20)
CALCIUM: 8.9 mg/dL (ref 8.9–10.3)
CO2: 20 mmol/L — AB (ref 22–32)
CREATININE: 2.53 mg/dL — AB (ref 0.44–1.00)
Chloride: 96 mmol/L — ABNORMAL LOW (ref 101–111)
GFR calc Af Amer: 19 mL/min — ABNORMAL LOW (ref >60)
GFR calc non Af Amer: 17 mL/min — ABNORMAL LOW (ref >60)
GLUCOSE: 373 mg/dL — AB (ref 65–99)
Potassium: 5.3 mmol/L — ABNORMAL HIGH (ref 3.5–5.1)
SODIUM: 131 mmol/L — AB (ref 135–145)
Total Protein: 6.3 g/dL — ABNORMAL LOW (ref 6.5–8.1)

## 2016-09-28 LAB — I-STAT TROPONIN, ED: Troponin i, poc: 0.28 ng/mL (ref 0.00–0.08)

## 2016-09-28 LAB — CBC WITH DIFFERENTIAL/PLATELET
BASOS ABS: 0 10*3/uL (ref 0.0–0.1)
BASOS PCT: 0 %
EOS ABS: 0 10*3/uL (ref 0.0–0.7)
Eosinophils Relative: 0 %
HEMATOCRIT: 27.4 % — AB (ref 36.0–46.0)
HEMOGLOBIN: 9.4 g/dL — AB (ref 12.0–15.0)
LYMPHS PCT: 4 %
Lymphs Abs: 0.6 10*3/uL — ABNORMAL LOW (ref 0.7–4.0)
MCH: 31.6 pg (ref 26.0–34.0)
MCHC: 34.3 g/dL (ref 30.0–36.0)
MCV: 92.3 fL (ref 78.0–100.0)
MONOS PCT: 10 %
Monocytes Absolute: 1.6 10*3/uL — ABNORMAL HIGH (ref 0.1–1.0)
NEUTROS PCT: 86 %
Neutro Abs: 13.4 10*3/uL — ABNORMAL HIGH (ref 1.7–7.7)
Platelets: 354 10*3/uL (ref 150–400)
RBC: 2.97 MIL/uL — ABNORMAL LOW (ref 3.87–5.11)
RDW: 13.8 % (ref 11.5–15.5)
WBC: 15.6 10*3/uL — ABNORMAL HIGH (ref 4.0–10.5)

## 2016-09-28 LAB — GLUCOSE, CAPILLARY: Glucose-Capillary: 270 mg/dL — ABNORMAL HIGH (ref 65–99)

## 2016-09-28 LAB — LIPASE, BLOOD: Lipase: 28 U/L (ref 11–51)

## 2016-09-28 LAB — CBG MONITORING, ED: Glucose-Capillary: 435 mg/dL — ABNORMAL HIGH (ref 65–99)

## 2016-09-28 LAB — MRSA PCR SCREENING: MRSA by PCR: NEGATIVE

## 2016-09-28 SURGERY — TEMPORARY PACEMAKER
Anesthesia: LOCAL

## 2016-09-28 MED ORDER — TIOTROPIUM BROMIDE MONOHYDRATE 18 MCG IN CAPS
18.0000 ug | ORAL_CAPSULE | Freq: Every day | RESPIRATORY_TRACT | Status: DC
  Administered 2016-09-29 – 2016-10-02 (×3): 18 ug via RESPIRATORY_TRACT
  Filled 2016-09-28 (×2): qty 5

## 2016-09-28 MED ORDER — INSULIN NPH (HUMAN) (ISOPHANE) 100 UNIT/ML ~~LOC~~ SUSP
5.0000 [IU] | Freq: Two times a day (BID) | SUBCUTANEOUS | Status: DC
Start: 2016-09-28 — End: 2016-09-28

## 2016-09-28 MED ORDER — LIDOCAINE HCL (PF) 1 % IJ SOLN
INTRAMUSCULAR | Status: DC | PRN
  Administered 2016-09-28: 15 mL via SUBCUTANEOUS

## 2016-09-28 MED ORDER — INSULIN NPH (HUMAN) (ISOPHANE) 100 UNIT/ML ~~LOC~~ SUSP
20.0000 [IU] | Freq: Every day | SUBCUTANEOUS | Status: DC
  Administered 2016-09-30 – 2016-10-01 (×2): 20 [IU] via SUBCUTANEOUS
  Filled 2016-09-28: qty 10

## 2016-09-28 MED ORDER — ASPIRIN EC 81 MG PO TBEC
81.0000 mg | DELAYED_RELEASE_TABLET | Freq: Every day | ORAL | Status: DC
  Administered 2016-09-29 – 2016-10-04 (×5): 81 mg via ORAL
  Filled 2016-09-28 (×5): qty 1

## 2016-09-28 MED ORDER — SODIUM CHLORIDE 0.9 % IV BOLUS (SEPSIS)
1000.0000 mL | Freq: Once | INTRAVENOUS | Status: AC
  Administered 2016-09-28: 1000 mL via INTRAVENOUS

## 2016-09-28 MED ORDER — ATROPINE SULFATE 1 MG/10ML IJ SOSY
0.5000 mg | PREFILLED_SYRINGE | Freq: Once | INTRAMUSCULAR | Status: AC
  Administered 2016-09-28: 0.5 mg via INTRAVENOUS
  Filled 2016-09-28: qty 10

## 2016-09-28 MED ORDER — DEXTROSE 5 % IV SOLN
0.5000 ug/min | INTRAVENOUS | Status: DC
  Filled 2016-09-28: qty 4

## 2016-09-28 MED ORDER — ORAL CARE MOUTH RINSE
15.0000 mL | Freq: Two times a day (BID) | OROMUCOSAL | Status: DC
  Administered 2016-09-29 – 2016-10-03 (×5): 15 mL via OROMUCOSAL

## 2016-09-28 MED ORDER — SODIUM CHLORIDE 0.9 % IV SOLN
1.0000 g | Freq: Once | INTRAVENOUS | Status: AC
  Administered 2016-09-28: 1 g via INTRAVENOUS
  Filled 2016-09-28: qty 10

## 2016-09-28 MED ORDER — SODIUM CHLORIDE 0.9% FLUSH
3.0000 mL | Freq: Two times a day (BID) | INTRAVENOUS | Status: DC
  Administered 2016-09-29 – 2016-10-04 (×8): 3 mL via INTRAVENOUS

## 2016-09-28 MED ORDER — SODIUM CHLORIDE 0.9% FLUSH: 3.0000 mL | INTRAVENOUS | Status: DC | PRN

## 2016-09-28 MED ORDER — INSULIN NPH (HUMAN) (ISOPHANE) 100 UNIT/ML ~~LOC~~ SUSP
5.0000 [IU] | Freq: Every day | SUBCUTANEOUS | Status: DC
  Administered 2016-09-28 – 2016-09-30 (×3): 5 [IU] via SUBCUTANEOUS
  Filled 2016-09-28 (×2): qty 10

## 2016-09-28 MED ORDER — SODIUM CHLORIDE 0.9 % IV SOLN
INTRAVENOUS | Status: AC
  Administered 2016-09-28: 20:00:00 via INTRAVENOUS

## 2016-09-28 MED ORDER — PREDNISONE 10 MG PO TABS
5.0000 mg | ORAL_TABLET | Freq: Every day | ORAL | Status: DC
  Administered 2016-09-29: 5 mg via ORAL
  Filled 2016-09-28: qty 1

## 2016-09-28 MED ORDER — EPINEPHRINE PF 1 MG/10ML IJ SOSY
PREFILLED_SYRINGE | INTRAMUSCULAR | Status: AC
  Filled 2016-09-28: qty 20

## 2016-09-28 MED ORDER — LIDOCAINE HCL (PF) 1 % IJ SOLN
INTRAMUSCULAR | Status: AC
  Filled 2016-09-28: qty 30

## 2016-09-28 MED ORDER — DOPAMINE-DEXTROSE 1.6-5 MG/ML-% IV SOLN: 2.0000 ug/kg/min | INTRAVENOUS | Status: DC

## 2016-09-28 MED ORDER — DOPAMINE-DEXTROSE 3.2-5 MG/ML-% IV SOLN
0.0000 ug/kg/min | INTRAVENOUS | Status: DC
  Administered 2016-09-28: 5 ug/kg/min via INTRAVENOUS
  Filled 2016-09-28: qty 250

## 2016-09-28 MED ORDER — ALBUTEROL SULFATE (2.5 MG/3ML) 0.083% IN NEBU
3.0000 mL | INHALATION_SOLUTION | RESPIRATORY_TRACT | Status: DC | PRN
  Administered 2016-09-30 – 2016-10-01 (×4): 3 mL via RESPIRATORY_TRACT
  Filled 2016-09-28 (×4): qty 3

## 2016-09-28 MED ORDER — LEVOTHYROXINE SODIUM 100 MCG PO TABS
100.0000 ug | ORAL_TABLET | Freq: Every day | ORAL | Status: DC
  Administered 2016-09-29 – 2016-10-04 (×6): 100 ug via ORAL
  Filled 2016-09-28 (×6): qty 1

## 2016-09-28 MED ORDER — INSULIN NPH (HUMAN) (ISOPHANE) 100 UNIT/ML ~~LOC~~ SUSP
20.0000 [IU] | Freq: Every day | SUBCUTANEOUS | Status: DC
  Filled 2016-09-28: qty 10

## 2016-09-28 MED ORDER — INSULIN ASPART 100 UNIT/ML ~~LOC~~ SOLN
0.0000 [IU] | SUBCUTANEOUS | Status: DC
  Administered 2016-09-28: 8 [IU] via SUBCUTANEOUS
  Administered 2016-09-29: 5 [IU] via SUBCUTANEOUS
  Administered 2016-09-29 (×2): 2 [IU] via SUBCUTANEOUS
  Administered 2016-09-29: 5 [IU] via SUBCUTANEOUS
  Administered 2016-09-29: 3 [IU] via SUBCUTANEOUS
  Administered 2016-09-30: 2 [IU] via SUBCUTANEOUS
  Administered 2016-09-30: 3 [IU] via SUBCUTANEOUS
  Administered 2016-09-30: 2 [IU] via SUBCUTANEOUS
  Administered 2016-09-30: 8 [IU] via SUBCUTANEOUS
  Administered 2016-09-30: 5 [IU] via SUBCUTANEOUS
  Administered 2016-09-30: 8 [IU] via SUBCUTANEOUS
  Administered 2016-10-01 – 2016-10-02 (×2): 3 [IU] via SUBCUTANEOUS
  Administered 2016-10-02 (×2): 8 [IU] via SUBCUTANEOUS
  Administered 2016-10-02: 5 [IU] via SUBCUTANEOUS
  Administered 2016-10-02: 2 [IU] via SUBCUTANEOUS
  Administered 2016-10-03 (×2): 11 [IU] via SUBCUTANEOUS
  Administered 2016-10-03: 8 [IU] via SUBCUTANEOUS
  Administered 2016-10-04: 3 [IU] via SUBCUTANEOUS

## 2016-09-28 MED ORDER — SODIUM CHLORIDE 0.9 % IV SOLN: 250.0000 mL | INTRAVENOUS | Status: DC | PRN

## 2016-09-28 MED ORDER — INSULIN NPH (HUMAN) (ISOPHANE) 100 UNIT/ML ~~LOC~~ SUSP
5.0000 [IU] | Freq: Every day | SUBCUTANEOUS | Status: DC
  Filled 2016-09-28: qty 10

## 2016-09-28 MED ORDER — ONDANSETRON HCL 4 MG/2ML IJ SOLN
4.0000 mg | Freq: Once | INTRAMUSCULAR | Status: AC
  Administered 2016-09-28: 4 mg via INTRAVENOUS
  Filled 2016-09-28: qty 2

## 2016-09-28 MED ORDER — SODIUM CHLORIDE 0.9 % IV SOLN
INTRAVENOUS | Status: DC
  Administered 2016-09-28 – 2016-10-01 (×3): via INTRAVENOUS

## 2016-09-28 MED ORDER — FERROUS SULFATE 325 (65 FE) MG PO TABS
325.0000 mg | ORAL_TABLET | Freq: Every day | ORAL | Status: DC
  Administered 2016-09-29 – 2016-10-04 (×6): 325 mg via ORAL
  Filled 2016-09-28 (×6): qty 1

## 2016-09-28 MED ORDER — HEPARIN (PORCINE) IN NACL 2-0.9 UNIT/ML-% IJ SOLN
INTRAMUSCULAR | Status: AC
  Filled 2016-09-28: qty 500

## 2016-09-28 SURGICAL SUPPLY — 5 items
CABLE ADAPT CONN TEMP 6FT (ADAPTER) ×1 IMPLANT
CATH S G BIP PACING (SET/KITS/TRAYS/PACK) ×1 IMPLANT
PACK CARDIAC CATHETERIZATION (CUSTOM PROCEDURE TRAY) ×1 IMPLANT
SHEATH PINNACLE 6F 10CM (SHEATH) ×1 IMPLANT
SLEEVE REPOSITIONING LENGTH 30 (MISCELLANEOUS) ×1 IMPLANT

## 2016-09-28 NOTE — H&P (Signed)
PULMONARY / CRITICAL CARE MEDICINE   Name: Michelle [name] MRN: [id number] DOB: [birth year]    ADMISSION DATE:  09/28/2016 CONSULTATION DATE: 09/28/16  REFERRING MD:    CHIEF COMPLAINT: Complete heart block  HISTORY OF PRESENT ILLNESS:   82 year old with past medical history of asthma, COPD, CAD, diabetes, Alzheimers dementia with known history of conduction system disease, sinus node disease. She has declined pacemaker in the past. She was admitted and November with hyperglycemia, transient complete heart block which was started on epinephrine infusion with resolution of heart block.  She returns to the ED with nausea, vomiting, hyperglycemia. She was found to be in complete heart block in ED with a ventricular escape rythm at 20 bpm with periods of asystole in the ED. She was transferred to [facility] from [place] and temporary pacemaker placed. PCCM consulted for admission.  PAST MEDICAL HISTORY :  She  has a past medical history of Arthritis; Asthma; COPD (chronic obstructive pulmonary disease) ([place]); Coronary atherosclerosis; Dyslipidemia; Gallstones; History of fractured kneecap; Hypothyroidism; Interstitial cystitis; Malaise and fatigue; Memory loss; Polymyalgia rheumatica ([place]); Systemic hypertension; Type II or unspecified type diabetes mellitus without mention of complication, not stated as uncontrolled; and UTI (lower urinary tract infection).  PAST SURGICAL HISTORY: She  has a past surgical history that includes Lumbar disc surgery; Total abdominal hysterectomy; Rotator cuff repair; Tonsillectomy; Cervical spine surgery; Cholecystectomy; Hernia repair; Cardiac catheterization (01/29/2006); Cardiac catheterization (03/15/2001); US ECHOCARDIOGRAPHY (09/14/2007); NM MYOCAR PERF WALL MOTION (08/17/2009); and Cardiac catheterization (N/A, 08/21/2016).  No Known Allergies  No current facility-administered medications on file prior to encounter.    Current Outpatient Prescriptions on  File Prior to Encounter  Medication Sig  . acetaminophen (TYLENOL) 325 MG tablet Take 2 tablets (650 mg total) by mouth every 6 (six) hours as needed for mild pain (or Fever >/= 101).  Marland Kitchen albuterol (PROVENTIL HFA;VENTOLIN HFA) 108 (90 Base) MCG/ACT inhaler Inhale 1-2 puffs into the lungs every 4 (four) hours as needed for wheezing or shortness of breath.  Marland Kitchen aspirin 81 MG tablet Take 81 mg by mouth daily.  Marland Kitchen azithromycin (ZITHROMAX) 250 MG tablet 2 tabs po on day one, then one tablet po once daily on days 2-5.  Marland Kitchen Calcium Carbonate-Vit D-Min (CALCIUM 1200 PO) Take 1 tablet by mouth daily.   . cefixime (SUPRAX) 100 MG/5ML suspension Take 13 mLs (260 mg total) by mouth daily.  . cefUROXime (CEFTIN) 250 MG tablet Take 1 tablet (250 mg total) by mouth daily.  . Cholecalciferol (VITAMIN D3) 2000 units TABS Take 2,000 Units by mouth daily.   . ciprofloxacin (CIPRO) 250 MG tablet Take 1 tablet (250 mg total) by mouth 2 (two) times daily.  . feeding supplement, ENSURE ENLIVE, (ENSURE ENLIVE) LIQD Take 237 mLs by mouth 2 (two) times daily between meals. (Patient taking differently: Take 237 mLs by mouth daily. )  . fentaNYL (DURAGESIC - DOSED MCG/HR) 25 MCG/HR patch Place 25 mcg onto the skin every 3 (three) days.   . ferrous sulfate 325 (65 FE) MG tablet Take 325 mg by mouth daily with breakfast.  . HYDROcodone-acetaminophen (NORCO/VICODIN) 5-325 MG per tablet Take 1 tablet by mouth every 6 (six) hours as needed for moderate pain.   Marland Kitchen insulin lispro (HUMALOG) 100 UNIT/ML injection Inject 0-0.06 mLs (0-6 Units total) into the skin 3 (three) times daily as needed for high blood sugar. Patient uses sliding scale  . insulin NPH Human (HUMULIN N,NOVOLIN N) 100 UNIT/ML injection Inject 0.18 mLs (18 Units total)  into the skin every morning. (Patient taking differently: Inject 5-20 Units into the skin 2 (two) times daily. 20 units in the morning and 5 units in the evening)  . Insulin Syringe-Needle U-100 31G X 5/16" 1  ML MISC For injection of insulin QID  . levothyroxine (SYNTHROID, LEVOTHROID) 100 MCG tablet TAKE 1 TABLET (100 MCG TOTAL) BY MOUTH DAILY.  . Multiple Vitamins-Minerals (OCUVITE PRESERVISION PO) Take 1 capsule by mouth daily.  . ONE TOUCH ULTRA TEST test strip TEST BS 5-6 TIMES A DAY  . predniSONE (DELTASONE) 5 MG tablet Take 1 tablet by mouth daily.  . psyllium (HYDROCIL/METAMUCIL) 95 % PACK Take 1 packet by mouth 3 (three) times daily.  . tamsulosin (FLOMAX) 0.4 MG CAPS capsule Take 1 capsule (0.4 mg total) by mouth daily after breakfast.  . telmisartan (MICARDIS) 40 MG tablet Take 1 tablet (40 mg total) by mouth daily.  Marland Kitchen tiotropium (SPIRIVA HANDIHALER) 18 MCG inhalation capsule Place 1 capsule (18 mcg total) into inhaler and inhale daily.  Marland Kitchen UNABLE TO FIND Take 1 tablet by mouth daily. Med Name: probiotic capsule   Takes one daily.   . vitamin B-12 100 MCG tablet Take 1 tablet (100 mcg total) by mouth daily.    FAMILY HISTORY:  Her indicated that her mother is deceased. She indicated that her father is deceased. She indicated that the status of her brother is unknown.    SOCIAL HISTORY: She  reports that she has never smoked. She has never used smokeless tobacco. She reports that she does not drink alcohol or use drugs.  REVIEW OF SYSTEMS:   Unable to obtain due to altered mental status  SUBJECTIVE:    VITAL SIGNS: BP (!) 144/53   Pulse (!) 0   Temp 98.4 F (36.9 C) (Oral)   Resp (!) 0   SpO2 (!) 0%   HEMODYNAMICS:    VENTILATOR SETTINGS:    INTAKE / OUTPUT: No intake/output data recorded.  PHYSICAL EXAMINATION: General:  Somnolent, arousable Neuro:  No gross focal deficits HEENT:  Moist mucus membranes, NO JVD Cardiovascular: Paced rhythm, No MRG Lungs:  Clear Abdomen:  Soft, + BS Musculoskeletal:  Normal tone and bulk Skin:  Intact  LABS:  BMET  Recent Labs Lab 09/22/16 1519 09/28/16 1551  NA 134* 131*  K 4.6 5.3*  CL 98 96*  CO2 27 20*  BUN 24  43*  CREATININE 1.21* 2.53*  GLUCOSE 179* 373*    Electrolytes  Recent Labs Lab 09/22/16 1519 09/28/16 1551  CALCIUM 9.0 8.9    CBC  Recent Labs Lab 09/28/16 1551  WBC 15.6*  HGB 9.4*  HCT 27.4*  PLT 354    Coag's No results for input(s): APTT, INR in the last 168 hours.  Sepsis Markers No results for input(s): LATICACIDVEN, PROCALCITON, O2SATVEN in the last 168 hours.  ABG No results for input(s): PHART, PCO2ART, PO2ART in the last 168 hours.  Liver Enzymes  Recent Labs Lab 09/28/16 1551  AST 1,969*  ALT 1,118*  ALKPHOS 115  BILITOT 0.4  ALBUMIN 3.6    Cardiac Enzymes No results for input(s): TROPONINI, PROBNP in the last 168 hours.  Glucose  Recent Labs Lab 09/28/16 1500  GLUCAP 435*    Imaging Dg Chest 2 View  Result Date: 09/28/2016 CLINICAL DATA:  Nausea and vomiting. EXAM: CHEST  2 VIEW COMPARISON:  September 25, 2016 FINDINGS: Stable cardiomegaly. The hila and mediastinum are unchanged. Mild increased interstitial markings. No other interval changes or acute abnormalities.  IMPRESSION: Mild increased interstitial markings may represent pulmonary venous congestion versus bronchitic changes. Recommend clinical correlation. Electronically Signed   By: Dorise Bullion III M.D   On: 09/28/2016 15:50     STUDIES:    CULTURES:   ANTIBIOTICS:   SIGNIFICANT EVENTS:   LINES/TUBES:   DISCUSSION: Complete heart block s/p temporary transvenous pacing. Refused permanent pacemaker in past  ASSESSMENT / PLAN:  PULMONARY A: COPD P:   Supplemental O2 Duonebs Continue Spiriva  CARDIOVASCULAR A:  Complete heart block. Refused pacemaker in past S/p temporary pacemeker P:  EP team to see in am and discuss palliation vs placement of permanent pacemaker.   RENAL A:   AKI in setting of cardiogenic shock P:   Gentle fluid hydration Monitor urine output and cr  GASTROINTESTINAL A:   Elevated LFTs Shock liver P:   Monitor  LFTs RUQ [place]  HEMATOLOGIC A:   Leukocytosis P:  [place]  INFECTIOUS A:   No clear evidence of infection Recent treatment with Z pack for URTI P:   Observe off antibiotics  ENDOCRINE A:   DM Elevated Blood sugars  Polymyagia rheumatica Hypothyroidism P:   Check TSH Continue synthyroid Continue home dose of prednisone Continue home NPH, SSI.  NEUROLOGIC A:   Alzheimer dementia P:   Hold Namenda   FAMILY  - Updates: Daughter and family updated at bediside - Inter-disciplinary family meet or Palliative Care meeting due by:  11/26   Critical care time- 35 mins  Marshell Garfinkel MD [facility] Pager [phone] If no answer or after 3pm call: [phone] 09/28/2016, 7:51 PM

## 2016-09-28 NOTE — Progress Notes (Signed)
Pt arrived to rm 4N23, being transcutaneous paced rate of 60.  No s/s of any acute distress or pain.  Pt alert and responsive.  Paged Dr. Radford Pax whom states, "bring pt to cathlab"  Packed pt back up on stretcher and transported pt to cathlab.  Pt alert and stable upon arrival to cathlab.

## 2016-09-28 NOTE — Progress Notes (Signed)
Orthopedic Tech Progress Note Patient Details:  Michelle [name] [birth year] [id number]  Ortho Devices Type of Ortho Device: Knee Immobilizer Ortho Device/Splint Location: rle Ortho Device/Splint Interventions: Ordered, Application Applied knee immobilizer to rle as per drs verbal order  Karolee Stamps 09/28/2016, 8:59 PM

## 2016-09-28 NOTE — ED Notes (Signed)
Pt transported to xray 

## 2016-09-28 NOTE — ED Notes (Addendum)
Pt's HR dropped to 25-30's.  HR very quickly picked back up to high 50s - 60s.  Pt did not have any symptoms.  Family member stated that recently MD's suggested that pt get a pacemaker.  EKG taken and given to Dr. Oleta Mouse.  Dr. Oleta Mouse made aware of pt's drop in HR.  Pt continues to state "I don't feel well."

## 2016-09-28 NOTE — Consult Note (Addendum)
Admit date: 09/28/2016 Referring Physician  Dr. Oleta Mouse Primary Physician   Primary Cardiologist  Dr. Curt Bears Reason for Consultation  Complete heart block and cardiogenic shock  HPI: Michelle [name] is a 82 y.o. female with PMHx of advanced dimentia, known hx of conduction system disease, historically had declined PPM, she last saw dr. Loletha Grayer it looks like in 2015, at that time he mentioned: " last year she was referred for significant bradycardia at rest, but her Holter monitor showed normal heart rate response to activity and so the decision was made not to pursue a pacemaker. She has now developed recurrent episodes of dizziness and lightheadedness with near syncope. When she checks her heart rate these episodes are associated with rates of 30-40 beats per minute. She has been unable to take medications for dementia because of the bradycardia"  He felt she had clear evidence of sinus node dysfunction and recommended pacer implant but the patient wanted to discuss with family and does not appear she had f/u since that time.   She was admitted to the hospital from home on 08/21/2016 due to 1 day history of severe, constant nausea and vomiting, diarrhea reported to me by the caregiver, several episodes.  EMS was called the patient was found to be bradycardic with a complete heart block. CBG in the 40's. She was started on epinephrine infusion and by the time patient reached the ER, her heart block resolved and epinephrine was weaned off.She was seen by EP during that admission and her namenda was stopped and AV block resolved and no PPM was performed.    She presented to the ER today with N/V and hyperglycemia.  She was recently seen at Urgent care for a cough on 11/156 and was sent home on inhalers and Z-pack.  Her N/V started yesterday associated with abdominal pain.  BS today was high and EMS was called and in ER was found to be in CHB with ventricular escape at 20bpm.  She had 2 episodes of syncope  after complete heart block and also a few episodes of asystole.  Labs showed markedly elevated LFTs c/w shock liver, acute on chronic kidney disease with creatinine 2.53, BS 373 and trop 0.28.  WBC 15.6.  Cardiology is consulted for complete heart block.  Patient was transferred by [facility] for Temp pacer and external pacing was started due to 6 seconds of asystole.  She denies chest pain but has had severe weakness and SOB along with N/V.      PMH:   Past Medical History:  Diagnosis Date  . Arthritis   . Asthma   . COPD (chronic obstructive pulmonary disease) ([place])   . Coronary atherosclerosis   . Dyslipidemia   . Gallstones   . History of fractured kneecap   . Hypothyroidism   . Interstitial cystitis   . Malaise and fatigue   . Memory loss   . Polymyalgia rheumatica ([place])   . Systemic hypertension   . Type II or unspecified type diabetes mellitus without mention of complication, not stated as uncontrolled   . UTI (lower urinary tract infection)      PSH:   Past Surgical History:  Procedure Laterality Date  . CARDIAC CATHETERIZATION  01/29/2006   10% luminal irregularity mid LAD  . CARDIAC CATHETERIZATION  03/15/2001   No significant CAD, no evidence of renal artery stenosis, systemic hypertenion  . CARDIAC CATHETERIZATION N/A 08/21/2016   Procedure: Temporary Pacemaker;  Surgeon: Jettie Booze, MD;  Location:  [facility];  Service: Cardiovascular;  Laterality: N/A;  . CERVICAL SPINE SURGERY    . CHOLECYSTECTOMY    . HERNIA REPAIR    . LUMBAR DISC SURGERY    . NM MYOCAR PERF WALL MOTION  08/17/2009   normal  . ROTATOR CUFF REPAIR    . TONSILLECTOMY    . TOTAL ABDOMINAL HYSTERECTOMY    . US ECHOCARDIOGRAPHY  09/14/2007   mild LVH, LA mildly dilated,mild mitral annular calcification, AOV mildly sclerotic, aortic root sclerosis/ca+    Allergies:  Patient has no known allergies. Prior to Admit Meds:   (Not in a hospital admission) Fam HX:    Family History    Problem Relation Age of Onset  . Breast cancer Mother   . Heart disease Father   . Lung cancer Brother    Social HX:    Social History   Social History  . Marital status: Widowed    Spouse name: N/A  . Number of children: 4  . Years of education: 12   Occupational History  . retired Retired   Social History Main Topics  . Smoking status: Never Smoker  . Smokeless tobacco: Never Used  . Alcohol use No  . Drug use: No  . Sexual activity: Not on file   Other Topics Concern  . Not on file   Social History Narrative   Patient lives at home alone. Widowed.   Retired.   Education High school   Right handed   Caffeine- some times coffee and soda one cup.     ROS:  All 11 ROS were addressed and are negative except what is stated in the HPI  Physical Exam: Blood pressure 137/97, pulse 60, temperature 98.4 F (36.9 C), temperature source Oral, resp. rate 21, SpO2 98 %.    General: ill appearing female  Head: Eyes PERRLA, No xanthomas.   Normal cephalic and atramatic  Lungs:   Clear bilaterally to auscultation anteriorly Heart:   HRRR S1 S2 Pulses are 2+ & equal. Abdomen: Bowel sounds are positive, abdomen soft and non-tender without masses  Msk:  Back normal, normal gait. Normal strength and tone for age. Extremities:   No clubbing, cyanosis or edema.  DP +1 Neuro: Alert and oriented X 3. Psych:  Good affect, responds appropriately    Labs:   Lab Results  Component Value Date   WBC 15.6 (H) 09/28/2016   HGB 9.4 (L) 09/28/2016   HCT 27.4 (L) 09/28/2016   MCV 92.3 09/28/2016   PLT 354 09/28/2016    Recent Labs Lab 09/28/16 1551  NA 131*  K 5.3*  CL 96*  CO2 20*  BUN 43*  CREATININE 2.53*  CALCIUM 8.9  PROT 6.3*  BILITOT 0.4  ALKPHOS 115  ALT 1,118*  AST 1,969*  GLUCOSE 373*   No results found for: PTT Lab Results  Component Value Date   INR 1.16 03/11/2016   INR 1.14 06/12/2015   INR 1.05 02/24/2015   Lab Results  Component Value Date    TROPONINI 0.20 (HH) 08/21/2016     Lab Results  Component Value Date   CHOL 193 08/25/2016   CHOL 189 06/13/2015   Lab Results  Component Value Date   HDL 66 08/25/2016   HDL 62 06/13/2015   Lab Results  Component Value Date   LDLCALC 112 (H) 08/25/2016   LDLCALC 119 (H) 06/13/2015   Lab Results  Component Value Date   TRIG 74 08/25/2016   TRIG 39  06/13/2015   Lab Results  Component Value Date   CHOLHDL 2.9 08/25/2016   CHOLHDL 3.0 06/13/2015   No results found for: LDLDIRECT    Radiology:  Dg Chest 2 View  Result Date: 09/28/2016 CLINICAL DATA:  Nausea and vomiting. EXAM: CHEST  2 VIEW COMPARISON:  September 25, 2016 FINDINGS: Stable cardiomegaly. The hila and mediastinum are unchanged. Mild increased interstitial markings. No other interval changes or acute abnormalities. IMPRESSION: Mild increased interstitial markings may represent pulmonary venous congestion versus bronchitic changes. Recommend clinical correlation. Electronically Signed   By: Dorise Bullion III M.D   On: 09/28/2016 15:50    EKG:  NSR with complete heart block and ventricular escape of 43bpm.   ASSESSMENT/PLAN:   1.  Complete heart block - this appears intermittent and associated with HRs as low as 20bpm.  She had 2 episodes of syncope in the ER associated with asystole as long as 6 seconds requiring external pacing.  She has declined PPM in 2015 and was evaluated by EP a month ago and her dementia meds were stopped and heart block resolved.  Her K+ is 5.3.  She has evidence of end organ hypoperfusion with shock liver, acute on chronic renal failure and worsening mentation.  Discussed case with Dr. Angelena Form and since family has not gone down the route of Palliative Care will proceed with temporary pacer and then get EP to see again.    2.  Cardiogenic shock secondary to #2.  See above.  3.  Shock liver with LFTs in the 1900's.  Per CCM.  4.  Acute on CKD stage 3 - per CCM  5.  DM with hyperglycemia  and BS 373 - per CCM  6.  Elevated trop at 0.28 secondary to #1.  Continue to cycle.  Cath in 2007 with 10% LAD  CCM to admit patient and we will follow with you  The patient is critically ill with multiple organ systems failure and requires high complexity decision making for assessment and support, frequent evaluation and titration of therapies, application of advanced monitoring technologies and extensive interpretation of multiple databases. Critical Care Time devoted to patient care services described in this note independent of APP time is 60 minutes with >50% of time spent in direct patient care.     Fransico Him, MD  09/28/2016  5:28 PM

## 2016-09-28 NOTE — ED Notes (Signed)
Informed by tech that pt's O2 sat dropped tot he 80's. Pt placed on 2L [place] and and O2 sat rose up to 99%.  Dr.Liu made aware.

## 2016-09-28 NOTE — ED Notes (Signed)
Bed: RESA Expected date:  Expected time:  Means of arrival:  Comments: Hyperglycemia-18 when d/c

## 2016-09-28 NOTE — ED Provider Notes (Signed)
[facility] DEPT Provider Note   CSN: [id number] Arrival date & time: 09/28/16  1451     History   Chief Complaint Chief Complaint  Patient presents with  . Emesis  . Hyperglycemia    HPI Michelle [name] is a 82 y.o. female.  HPI 82 year old female who presents with nausea, vomiting and hyperglycemia. She has a history of COPD, coronary artery disease, type 2 diabetes on insulin, and interstitial cystitis. Patient states she lives at home alone, recently seen in urgent care for cough on November 16 and discharged with an albuterol inhaler and a Z-Pak. Says that she's finished her treatment with persistent cough and shortness of breath. Yesterday evening developed nausea, vomiting that is nonbilious and nonbloody. Associated with mild abdominal discomfort. No chest pain, fevers, difficulty urinating, or diarrhea. States that she checked her blood sugar this morning and it read high so she subsequently called EMS.   Past Medical History:  Diagnosis Date  . Arthritis   . Asthma   . COPD (chronic obstructive pulmonary disease) ([place])   . Coronary atherosclerosis   . Dyslipidemia   . Gallstones   . History of fractured kneecap   . Hypothyroidism   . Interstitial cystitis   . Malaise and fatigue   . Memory loss   . Polymyalgia rheumatica ([place])   . Systemic hypertension   . Type II or unspecified type diabetes mellitus without mention of complication, not stated as uncontrolled   . UTI (lower urinary tract infection)     Patient Active Problem List   Diagnosis Date Noted  . Bradycardia   . Dyspnea   . Hypoglycemia 08/21/2016  . Complete heart block ([place]) 08/21/2016  . Hypertension 08/21/2016  . Hyperkalemia   . Alzheimer's dementia 04/23/2016  . Diabetes mellitus with complication ([place])   . HLD (hyperlipidemia) 06/13/2015  . Nausea & vomiting 06/12/2015  . CAD (coronary artery disease) 06/12/2015  . Acute renal failure superimposed on stage 3 chronic kidney disease  ([place]) 06/12/2015  . Hypothyroidism   . Symptomatic sinus bradycardia 01/16/2014  . Abnormality of gait 11/24/2013  . Hip pain 11/24/2013  . Knee pain 11/24/2013  . Memory loss 05/23/2013  . HYPERLIPIDEMIA 01/08/2008  . Asthma with COPD ([place]) 01/06/2008  . Polymyalgia rheumatica ([place]) 01/06/2008  . DYSPNEA 12/17/2007    Past Surgical History:  Procedure Laterality Date  . CARDIAC CATHETERIZATION  01/29/2006   10% luminal irregularity mid LAD  . CARDIAC CATHETERIZATION  03/15/2001   No significant CAD, no evidence of renal artery stenosis, systemic hypertenion  . CARDIAC CATHETERIZATION N/A 08/21/2016   Procedure: Temporary Pacemaker;  Surgeon: Jettie Booze, MD;  Location: [facility];  Service: Cardiovascular;  Laterality: N/A;  . CERVICAL SPINE SURGERY    . CHOLECYSTECTOMY    . HERNIA REPAIR    . LUMBAR DISC SURGERY    . NM MYOCAR PERF WALL MOTION  08/17/2009   normal  . ROTATOR CUFF REPAIR    . TONSILLECTOMY    . TOTAL ABDOMINAL HYSTERECTOMY    . US ECHOCARDIOGRAPHY  09/14/2007   mild LVH, LA mildly dilated,mild mitral annular calcification, AOV mildly sclerotic, aortic root sclerosis/ca+    OB History    No data available       Home Medications    Prior to Admission medications   Medication Sig Start Date End Date Taking? Authorizing Provider  acetaminophen (TYLENOL) 325 MG tablet Take 2 tablets (650 mg total) by mouth every 6 (six) hours as  needed for mild pain (or Fever >/= 101). 08/25/16   Belkys A Regalado, MD  albuterol (PROVENTIL HFA;VENTOLIN HFA) 108 (90 Base) MCG/ACT inhaler Inhale 1-2 puffs into the lungs every 4 (four) hours as needed for wheezing or shortness of breath. 09/25/16   Janne Napoleon, NP  aspirin 81 MG tablet Take 81 mg by mouth daily.    Historical Provider, MD  azithromycin (ZITHROMAX) 250 MG tablet 2 tabs po on day one, then one tablet po once daily on days 2-5. 09/25/16   Janne Napoleon, NP  Calcium Carbonate-Vit D-Min (CALCIUM 1200 PO)  Take 1 tablet by mouth daily.     Historical Provider, MD  cefixime (SUPRAX) 100 MG/5ML suspension Take 13 mLs (260 mg total) by mouth daily. 08/25/16   Belkys A Regalado, MD  cefUROXime (CEFTIN) 250 MG tablet Take 1 tablet (250 mg total) by mouth daily. 09/05/16   Susy Frizzle, MD  Cholecalciferol (VITAMIN D3) 2000 units TABS Take 2,000 Units by mouth daily.     Historical Provider, MD  ciprofloxacin (CIPRO) 250 MG tablet Take 1 tablet (250 mg total) by mouth 2 (two) times daily. 09/11/16   Susy Frizzle, MD  feeding supplement, ENSURE ENLIVE, (ENSURE ENLIVE) LIQD Take 237 mLs by mouth 2 (two) times daily between meals. Patient taking differently: Take 237 mLs by mouth daily.  04/26/16   Nishant Dhungel, MD  fentaNYL (DURAGESIC - DOSED MCG/HR) 25 MCG/HR patch Place 25 mcg onto the skin every 3 (three) days.  05/26/16   Historical Provider, MD  ferrous sulfate 325 (65 FE) MG tablet Take 325 mg by mouth daily with breakfast.    Historical Provider, MD  HYDROcodone-acetaminophen (NORCO/VICODIN) 5-325 MG per tablet Take 1 tablet by mouth every 6 (six) hours as needed for moderate pain.     Historical Provider, MD  insulin lispro (HUMALOG) 100 UNIT/ML injection Inject 0-0.06 mLs (0-6 Units total) into the skin 3 (three) times daily as needed for high blood sugar. Patient uses sliding scale 05/26/16   Susy Frizzle, MD  insulin NPH Human (HUMULIN N,NOVOLIN N) 100 UNIT/ML injection Inject 0.18 mLs (18 Units total) into the skin every morning. Patient taking differently: Inject 5-20 Units into the skin 2 (two) times daily. 20 units in the morning and 5 units in the evening 05/26/16   Susy Frizzle, MD  Insulin Syringe-Needle U-100 31G X 5/16" 1 ML MISC For injection of insulin QID 07/29/16   Susy Frizzle, MD  levothyroxine (SYNTHROID, LEVOTHROID) 100 MCG tablet TAKE 1 TABLET (100 MCG TOTAL) BY MOUTH DAILY. 01/28/16   Susy Frizzle, MD  Multiple Vitamins-Minerals (OCUVITE PRESERVISION PO) Take 1  capsule by mouth daily.    Historical Provider, MD  ONE TOUCH ULTRA TEST test strip TEST BS 5-6 TIMES A DAY 12/27/15   Susy Frizzle, MD  predniSONE (DELTASONE) 5 MG tablet Take 1 tablet by mouth daily. 04/15/16   Historical Provider, MD  psyllium (HYDROCIL/METAMUCIL) 95 % PACK Take 1 packet by mouth 3 (three) times daily.    Historical Provider, MD  tamsulosin (FLOMAX) 0.4 MG CAPS capsule Take 1 capsule (0.4 mg total) by mouth daily after breakfast. 08/26/16   Belkys A Regalado, MD  telmisartan (MICARDIS) 40 MG tablet Take 1 tablet (40 mg total) by mouth daily. 09/05/16   Susy Frizzle, MD  tiotropium (SPIRIVA HANDIHALER) 18 MCG inhalation capsule Place 1 capsule (18 mcg total) into inhaler and inhale daily. 03/17/16 02/26/18  Deneise Lever, MD  UNABLE TO FIND Take 1 tablet by mouth daily. Med Name: probiotic capsule   Takes one daily.     Historical Provider, MD  vitamin B-12 100 MCG tablet Take 1 tablet (100 mcg total) by mouth daily. 08/26/16   Elmarie Shiley, MD    Family History Family History  Problem Relation Age of Onset  . Breast cancer Mother   . Heart disease Father   . Lung cancer Brother     Social History Social History  Substance Use Topics  . Smoking status: Never Smoker  . Smokeless tobacco: Never Used  . Alcohol use No     Allergies   Patient has no known allergies.   Review of Systems Review of Systems 10/14 systems reviewed and are negative other than those stated in the HPI   Physical Exam Updated Vital Signs BP 137/97   Pulse 60   Temp 98.4 F (36.9 C) (Oral)   Resp 21   SpO2 98%   Physical Exam Physical Exam  Nursing note and vitals reviewed. Constitutional:  non-toxic, and in no acute distress Head: Normocephalic and atraumatic.  Mouth/Throat: Oropharynx is clear and dry mucous membranes.  Neck: Normal range of motion. Neck supple.  Cardiovascular: Normal rate and regular rhythm.   Pulmonary/Chest: Effort normal and breath sounds  normal.  Abdominal: Soft. There is no tenderness. There is no rebound and no guarding.  Musculoskeletal: Normal range of motion.  Neurological: Alert, no facial droop, fluent speech, moves all extremities symmetrically Skin: Skin is warm and dry.  Psychiatric: Cooperative   ED Treatments / Results  Labs (all labs ordered are listed, but only abnormal results are displayed) Labs Reviewed  CBC WITH DIFFERENTIAL/PLATELET - Abnormal; Notable for the following:       Result Value   WBC 15.6 (*)    RBC 2.97 (*)    Hemoglobin 9.4 (*)    HCT 27.4 (*)    Neutro Abs 13.4 (*)    Lymphs Abs 0.6 (*)    Monocytes Absolute 1.6 (*)    All other components within normal limits  COMPREHENSIVE METABOLIC PANEL - Abnormal; Notable for the following:    Sodium 131 (*)    Potassium 5.3 (*)    Chloride 96 (*)    CO2 20 (*)    Glucose, Bld 373 (*)    BUN 43 (*)    Creatinine, Ser 2.53 (*)    Total Protein 6.3 (*)    AST 1,969 (*)    ALT 1,118 (*)    GFR calc non Af Amer 17 (*)    GFR calc Af Amer 19 (*)    All other components within normal limits  URINALYSIS, ROUTINE W REFLEX MICROSCOPIC (NOT AT [facility]) - Abnormal; Notable for the following:    Glucose, UA 250 (*)    Protein, ur 100 (*)    All other components within normal limits  URINE MICROSCOPIC-ADD ON - Abnormal; Notable for the following:    Squamous Epithelial / LPF 0-5 (*)    Bacteria, UA RARE (*)    Casts HYALINE CASTS (*)    All other components within normal limits  CBG MONITORING, ED - Abnormal; Notable for the following:    Glucose-Capillary 435 (*)    All other components within normal limits  I-STAT TROPOININ, ED - Abnormal; Notable for the following:    Troponin i, poc 0.28 (*)    All other components within normal limits  LIPASE, BLOOD    EKG  EKG Interpretation None       Radiology Dg Chest 2 View  Result Date: 09/28/2016 CLINICAL DATA:  Nausea and vomiting. EXAM: CHEST  2 VIEW COMPARISON:  September 25, 2016  FINDINGS: Stable cardiomegaly. The hila and mediastinum are unchanged. Mild increased interstitial markings. No other interval changes or acute abnormalities. IMPRESSION: Mild increased interstitial markings may represent pulmonary venous congestion versus bronchitic changes. Recommend clinical correlation. Electronically Signed   By: Dorise Bullion III M.D   On: 09/28/2016 15:50    Procedures Procedures (including critical care time) CRITICAL CARE Performed by: Forde Dandy   Total critical care time: 60 minutes  Critical care time was exclusive of separately billable procedures and treating other patients.  Critical care was necessary to treat or prevent imminent or life-threatening deterioration.  Critical care was time spent personally by me on the following activities: development of treatment plan with patient and/or surrogate as well as nursing, discussions with consultants, evaluation of patient's response to treatment, examination of patient, obtaining history from patient or surrogate, ordering and performing treatments and interventions, ordering and review of laboratory studies, ordering and review of radiographic studies, pulse oximetry and re-evaluation of patient's condition.  Medications Ordered in ED Medications  DOPamine (INTROPIN) 800 mg in dextrose 5 % 250 mL (3.2 mg/mL) infusion (5 mcg/kg/min  49.9 kg Intravenous New Bag/Given 09/28/16 1742)  sodium chloride 0.9 % bolus 1,000 mL (1,000 mLs Intravenous New Bag/Given 09/28/16 1620)  ondansetron (ZOFRAN) injection 4 mg (4 mg Intravenous Given 09/28/16 1624)  atropine 1 MG/10ML injection 0.5 mg (0.5 mg Intravenous Given 09/28/16 1716)  calcium gluconate 1 g in sodium chloride 0.9 % 100 mL IVPB (0 g Intravenous Stopped 09/28/16 1756)     Initial Impression / Assessment and Plan / ED Course  I have reviewed the triage vital signs and the nursing notes.  Pertinent labs & imaging results that were available during my care  of the patient were reviewed by me and considered in my medical decision making (see chart for details).  Clinical Course     82 year old female presenting with 1 day of nausea and vomiting with hyperglycemia.  During ED course is noted to have bradycardia intermittently with heart rate in the 20s. She is noted now to be in complete heart block intermittently. Had 2 episodes of syncope x a few seconds from asystole.  Old records are reviewed. She was admitted in October for complete heart block. She had a temporary pacemaker. It was felt that her complete heart block was likely secondary to electrolyte derangements as well as some of her Alzheimer's medications, which was subsequently discontinued. She did not receive permanent pacemaker.  With evidence of hypoperfusion on blood work here today. She has acute kidney injury, troponin elevation, and evidence of shock liver.  Did initially treat with atropine. Had mild hyperkalemia 5.2 and given calcium, but unlikely to be potassium mediated. Will start on dopamine drip.  Discussed with Dr. Radford Pax from cardiology as well as Dr. Madalyn Rob from ICU. We'll plan to transfer to [facility] ICU for definitive care.  Final Clinical Impressions(s) / ED Diagnoses   Final diagnoses:  Complete heart block (HCC)  Shock liver  Elevated troponin  Acute kidney injury ([place])  Hyperglycemia    New Prescriptions New Prescriptions   No medications on file     Forde Dandy, MD 09/28/16 [phone]

## 2016-09-28 NOTE — ED Triage Notes (Signed)
Her family phoned EMS d/t pt. Feeling nauseated and vomited a few times since yesterday afternoon. She also was reported by them to have seen her pcp this week for uti sx and was prescribed atbx and inhaler. They phoned EMS when pt. Was found to have cbg at home of "HIGH". She arrives here in no distress.

## 2016-09-29 ENCOUNTER — Inpatient Hospital Stay (HOSPITAL_COMMUNITY): Payer: Medicare Other

## 2016-09-29 ENCOUNTER — Encounter (HOSPITAL_COMMUNITY)
Admission: EM | Disposition: A | Discharge status: Home Health | Payer: Self-pay | Source: Home / Self Care | Attending: Internal Medicine

## 2016-09-29 ENCOUNTER — Encounter (HOSPITAL_COMMUNITY): Payer: Self-pay | Admitting: Cardiovascular Disease

## 2016-09-29 DIAGNOSIS — I442 Atrioventricular block, complete: Secondary | ICD-10-CM

## 2016-09-29 DIAGNOSIS — N179 Acute kidney failure, unspecified: Secondary | ICD-10-CM

## 2016-09-29 HISTORY — PX: EP IMPLANTABLE DEVICE: SHX172B

## 2016-09-29 LAB — BASIC METABOLIC PANEL
ANION GAP: 10 (ref 5–15)
ANION GAP: 12 (ref 5–15)
BUN: 43 mg/dL — ABNORMAL HIGH (ref 6–20)
BUN: 35 mg/dL — ABNORMAL HIGH (ref 6–20)
CHLORIDE: 103 mmol/L (ref 101–111)
CO2: 22 mmol/L (ref 22–32)
CO2: 21 mmol/L — ABNORMAL LOW (ref 22–32)
Calcium: 8.8 mg/dL — ABNORMAL LOW (ref 8.9–10.3)
Calcium: 8.9 mg/dL (ref 8.9–10.3)
Chloride: 102 mmol/L (ref 101–111)
Creatinine, Ser: 2.31 mg/dL — ABNORMAL HIGH (ref 0.44–1.00)
Creatinine, Ser: 1.65 mg/dL — ABNORMAL HIGH (ref 0.44–1.00)
GFR calc Af Amer: 33 mL/min — ABNORMAL LOW (ref >60)
GFR, EST AFRICAN AMERICAN: 22 mL/min — AB (ref >60)
GFR, EST NON AFRICAN AMERICAN: 19 mL/min — AB (ref >60)
GFR, EST NON AFRICAN AMERICAN: 28 mL/min — AB (ref >60)
GLUCOSE: 90 mg/dL (ref 65–99)
Glucose, Bld: 179 mg/dL — ABNORMAL HIGH (ref 65–99)
POTASSIUM: 5 mmol/L (ref 3.5–5.1)
POTASSIUM: 5.8 mmol/L — AB (ref 3.5–5.1)
SODIUM: 136 mmol/L (ref 135–145)
Sodium: 134 mmol/L — ABNORMAL LOW (ref 135–145)

## 2016-09-29 LAB — MAGNESIUM: MAGNESIUM: 2 mg/dL (ref 1.7–2.4)

## 2016-09-29 LAB — GLUCOSE, CAPILLARY
GLUCOSE-CAPILLARY: 185 mg/dL — AB (ref 65–99)
GLUCOSE-CAPILLARY: 209 mg/dL — AB (ref 65–99)
GLUCOSE-CAPILLARY: 216 mg/dL — AB (ref 65–99)
Glucose-Capillary: 63 mg/dL — ABNORMAL LOW (ref 65–99)
Glucose-Capillary: 117 mg/dL — ABNORMAL HIGH (ref 65–99)
Glucose-Capillary: 150 mg/dL — ABNORMAL HIGH (ref 65–99)
Glucose-Capillary: 140 mg/dL — ABNORMAL HIGH (ref 65–99)
Glucose-Capillary: 134 mg/dL — ABNORMAL HIGH (ref 65–99)

## 2016-09-29 LAB — PROTIME-INR
INR: 1.24
Prothrombin Time: 15.7 seconds — ABNORMAL HIGH (ref 11.4–15.2)

## 2016-09-29 LAB — CBC
HEMATOCRIT: 28 % — AB (ref 36.0–46.0)
HEMOGLOBIN: 9.5 g/dL — AB (ref 12.0–15.0)
MCH: 30.9 pg (ref 26.0–34.0)
MCHC: 33.9 g/dL (ref 30.0–36.0)
MCV: 91.2 fL (ref 78.0–100.0)
Platelets: 304 10*3/uL (ref 150–400)
RBC: 3.07 MIL/uL — AB (ref 3.87–5.11)
RDW: 14 % (ref 11.5–15.5)
WBC: 16 10*3/uL — ABNORMAL HIGH (ref 4.0–10.5)

## 2016-09-29 LAB — HEPATIC FUNCTION PANEL
ALBUMIN: 3.5 g/dL (ref 3.5–5.0)
ALK PHOS: 128 U/L — AB (ref 38–126)
ALT: 991 U/L — AB (ref 14–54)
AST: 867 U/L — AB (ref 15–41)
BILIRUBIN INDIRECT: 0.2 mg/dL — AB (ref 0.3–0.9)
Bilirubin, Direct: 0.2 mg/dL (ref 0.1–0.5)
TOTAL PROTEIN: 6.6 g/dL (ref 6.5–8.1)
Total Bilirubin: 0.4 mg/dL (ref 0.3–1.2)

## 2016-09-29 LAB — PHOSPHORUS: PHOSPHORUS: 4.4 mg/dL (ref 2.5–4.6)

## 2016-09-29 LAB — SURGICAL PCR SCREEN
MRSA, PCR: NEGATIVE
STAPHYLOCOCCUS AUREUS: NEGATIVE

## 2016-09-29 LAB — PROCALCITONIN: Procalcitonin: 4.67 ng/mL

## 2016-09-29 LAB — TSH: TSH: 3.413 u[IU]/mL (ref 0.350–4.500)

## 2016-09-29 SURGERY — PACEMAKER IMPLANT

## 2016-09-29 MED ORDER — CARVEDILOL 6.25 MG PO TABS
6.2500 mg | ORAL_TABLET | Freq: Two times a day (BID) | ORAL | Status: DC
  Administered 2016-09-30 – 2016-10-04 (×8): 6.25 mg via ORAL
  Filled 2016-09-29 (×9): qty 1

## 2016-09-29 MED ORDER — HYDROCORTISONE NA SUCCINATE PF 100 MG IJ SOLR
50.0000 mg | Freq: Four times a day (QID) | INTRAMUSCULAR | Status: DC
Start: 2016-09-29 — End: 2016-09-30
  Administered 2016-09-29 – 2016-09-30 (×5): 50 mg via INTRAVENOUS
  Filled 2016-09-29 (×5): qty 2

## 2016-09-29 MED ORDER — HYDRALAZINE HCL 20 MG/ML IJ SOLN
10.0000 mg | Freq: Once | INTRAMUSCULAR | Status: AC
  Administered 2016-09-29: 10 mg via INTRAVENOUS
  Filled 2016-09-29: qty 1

## 2016-09-29 MED ORDER — GENTAMICIN SULFATE 40 MG/ML IJ SOLN
80.0000 mg | INTRAMUSCULAR | Status: AC
  Administered 2016-09-29: 80 mg

## 2016-09-29 MED ORDER — HEPARIN (PORCINE) IN NACL 2-0.9 UNIT/ML-% IJ SOLN
INTRAMUSCULAR | Status: AC
  Filled 2016-09-29: qty 500

## 2016-09-29 MED ORDER — CEFAZOLIN SODIUM-DEXTROSE 2-4 GM/100ML-% IV SOLN: 2.0000 g | INTRAVENOUS | Status: AC

## 2016-09-29 MED ORDER — SODIUM CHLORIDE 0.9 % IR SOLN
Status: AC
  Filled 2016-09-29: qty 2

## 2016-09-29 MED ORDER — CEFAZOLIN IN D5W 1 GM/50ML IV SOLN
1.0000 g | Freq: Three times a day (TID) | INTRAVENOUS | Status: AC
  Administered 2016-09-29 – 2016-09-30 (×3): 1 g via INTRAVENOUS
  Filled 2016-09-29 (×3): qty 50

## 2016-09-29 MED ORDER — LIDOCAINE HCL (PF) 1 % IJ SOLN
INTRAMUSCULAR | Status: DC | PRN
  Administered 2016-09-29: 40 mL via INTRADERMAL

## 2016-09-29 MED ORDER — SODIUM CHLORIDE 0.9 % IV SOLN: 250.0000 mL | INTRAVENOUS | Status: DC

## 2016-09-29 MED ORDER — SODIUM CHLORIDE 0.9 % IV SOLN
INTRAVENOUS | Status: DC
  Administered 2016-09-29: 13:00:00 via INTRAVENOUS

## 2016-09-29 MED ORDER — ACETAMINOPHEN 325 MG PO TABS
325.0000 mg | ORAL_TABLET | ORAL | Status: DC | PRN
Start: 2016-09-29 — End: 2016-10-04

## 2016-09-29 MED ORDER — CHLORHEXIDINE GLUCONATE 4 % EX LIQD
60.0000 mL | Freq: Once | CUTANEOUS | Status: AC
  Administered 2016-09-29: 4 via TOPICAL
  Filled 2016-09-29: qty 60
  Filled 2016-09-29: qty 15

## 2016-09-29 MED ORDER — DEXTROSE 50 % IV SOLN
INTRAVENOUS | Status: AC
  Administered 2016-09-29: 25 mL
  Filled 2016-09-29: qty 50

## 2016-09-29 MED ORDER — CEFAZOLIN SODIUM-DEXTROSE 2-4 GM/100ML-% IV SOLN
INTRAVENOUS | Status: AC
  Filled 2016-09-29: qty 100

## 2016-09-29 MED ORDER — WHITE PETROLATUM GEL
Status: AC
  Administered 2016-09-29: 09:00:00
  Filled 2016-09-29: qty 1

## 2016-09-29 MED ORDER — LIDOCAINE HCL (PF) 1 % IJ SOLN
INTRAMUSCULAR | Status: AC
  Filled 2016-09-29: qty 30

## 2016-09-29 MED ORDER — SODIUM CHLORIDE 0.9 % IR SOLN
Status: DC | PRN
  Administered 2016-09-29: 18:00:00

## 2016-09-29 MED ORDER — CHLORHEXIDINE GLUCONATE 4 % EX LIQD
60.0000 mL | Freq: Once | CUTANEOUS | Status: AC
  Filled 2016-09-29: qty 60

## 2016-09-29 MED ORDER — CARVEDILOL 6.25 MG PO TABS: 6.2500 mg | ORAL_TABLET | Freq: Once | ORAL | Status: DC

## 2016-09-29 MED ORDER — SODIUM CHLORIDE 0.9% FLUSH: 3.0000 mL | INTRAVENOUS | Status: DC | PRN

## 2016-09-29 MED ORDER — CHLORHEXIDINE GLUCONATE 4 % EX LIQD
Freq: Once | CUTANEOUS | Status: AC
  Administered 2016-09-29: 06:00:00 via TOPICAL
  Filled 2016-09-29: qty 15

## 2016-09-29 MED ORDER — CEFAZOLIN SODIUM-DEXTROSE 2-3 GM-% IV SOLR
INTRAVENOUS | Status: DC | PRN
  Administered 2016-09-29: 2 g via INTRAVENOUS

## 2016-09-29 MED ORDER — SODIUM CHLORIDE 0.9% FLUSH: 3.0000 mL | Freq: Two times a day (BID) | INTRAVENOUS | Status: DC

## 2016-09-29 MED ORDER — HEPARIN (PORCINE) IN NACL 2-0.9 UNIT/ML-% IJ SOLN
INTRAMUSCULAR | Status: DC | PRN
  Administered 2016-09-29: 17:00:00

## 2016-09-29 MED ORDER — ONDANSETRON HCL 4 MG/2ML IJ SOLN
4.0000 mg | Freq: Four times a day (QID) | INTRAMUSCULAR | Status: DC | PRN
  Administered 2016-09-30 – 2016-10-02 (×4): 4 mg via INTRAVENOUS
  Filled 2016-09-29 (×4): qty 2

## 2016-09-29 MED FILL — Heparin Sodium (Porcine) 2 Unit/ML in Sodium Chloride 0.9%: INTRAMUSCULAR | Qty: 500 | Status: AC

## 2016-09-29 SURGICAL SUPPLY — 7 items
CABLE SURGICAL S-101-97-12 (CABLE) ×1 IMPLANT
LEAD TENDRIL MRI 46CM LPA1200M (Lead) ×1 IMPLANT
LEAD TENDRIL MRI 52CM LPA1200M (Lead) ×1 IMPLANT
PACEMAKER ASSURITY DR-RF (Pacemaker) ×1 IMPLANT
PAD DEFIB LIFELINK (PAD) ×1 IMPLANT
SHEATH CLASSIC 8F (SHEATH) ×2 IMPLANT
TRAY PACEMAKER INSERTION (PACKS) ×1 IMPLANT

## 2016-09-29 NOTE — Progress Notes (Signed)
Hypoglycemic Event  CBG: 63  Treatment: D50 IV 25 mL and D50 IV 50 mL  Symptoms: None  Follow-up CBG: Time:0535 CBG Result:117  Possible Reasons for Event: Inadequate meal intake  Comments/MD notified: referred to hypoglycemic protocol. MD not notified.     Michelle [name]

## 2016-09-29 NOTE — Progress Notes (Addendum)
PULMONARY / CRITICAL CARE MEDICINE   Name: Michelle [name] MRN: [id number] DOB: [birth year]    ADMISSION DATE:  09/28/2016 CONSULTATION DATE: 09/28/16  REFERRING MD:    CHIEF COMPLAINT: Complete heart block  HISTORY OF PRESENT ILLNESS:   82 year old with past medical history of asthma, COPD, CAD, diabetes, Alzheimers dementia with known history of conduction system disease, sinus node disease. She has declined pacemaker in the past. She was admitted and November with hyperglycemia, transient complete heart block which was started on epinephrine infusion with resolution of heart block.  She returns to the ED with nausea, vomiting, hyperglycemia. She was found to be in complete heart block in ED with a ventricular escape rythm at 20 bpm with periods of asystole in the ED. She was transferred to [facility] from [place] and temporary pacemaker placed. PCCM consulted for admission.  SUBJECTIVE: awake, pacer wire temp in place   VITAL SIGNS: BP (!) 186/60   Pulse 66   Temp 99 F (37.2 C) (Oral)   Resp 13   Ht 5' (1.524 m)   Wt 54.4 kg (119 lb 14.9 oz)   SpO2 100%   BMI 23.42 kg/m   HEMODYNAMICS:    VENTILATOR SETTINGS:    INTAKE / OUTPUT: I/O last 3 completed shifts: In: 1015.2 [P.O.:60; I.V.:955.2] Out: -   PHYSICAL EXAMINATION: General:  Somnolent, arousable Neuro:  No gross focal deficits HEENT:  Moist mucus membranes, NO JVD Cardiovascular: Paced rhythm, No MRG Lungs:  Coarse Abdomen:  Soft, + BS, no r/g Musculoskeletal:  Normal tone and bulk Skin:  Intact  LABS:  BMET  Recent Labs Lab 09/22/16 1519 09/28/16 1551 09/29/16 0211  NA 134* 131* 134*  K 4.6 5.3* 5.0  CL 98 96* 102  CO2 27 20* 22  BUN 24 43* 43*  CREATININE 1.21* 2.53* 2.31*  GLUCOSE 179* 373* 90    Electrolytes  Recent Labs Lab 09/22/16 1519 09/28/16 1551 09/29/16 0211  CALCIUM 9.0 8.9 8.8*  MG  --   --  2.0  PHOS  --   --  4.4    CBC  Recent Labs Lab 09/28/16 1551  09/29/16 0211  WBC 15.6* 16.0*  HGB 9.4* 9.5*  HCT 27.4* 28.0*  PLT 354 304    Coag's No results for input(s): APTT, INR in the last 168 hours.  Sepsis Markers No results for input(s): LATICACIDVEN, PROCALCITON, O2SATVEN in the last 168 hours.  ABG No results for input(s): PHART, PCO2ART, PO2ART in the last 168 hours.  Liver Enzymes  Recent Labs Lab 09/28/16 1551  AST 1,969*  ALT 1,118*  ALKPHOS 115  BILITOT 0.4  ALBUMIN 3.6    Cardiac Enzymes No results for input(s): TROPONINI, PROBNP in the last 168 hours.  Glucose  Recent Labs Lab 09/28/16 1500 09/28/16 2021 09/29/16 0015 09/29/16 0450 09/29/16 0535 09/29/16 0857  GLUCAP 435* 270* 209* 63* 117* 185*    Imaging Dg Chest 2 View  Result Date: 09/28/2016 CLINICAL DATA:  Nausea and vomiting. EXAM: CHEST  2 VIEW COMPARISON:  September 25, 2016 FINDINGS: Stable cardiomegaly. The hila and mediastinum are unchanged. Mild increased interstitial markings. No other interval changes or acute abnormalities. IMPRESSION: Mild increased interstitial markings may represent pulmonary venous congestion versus bronchitic changes. Recommend clinical correlation. Electronically Signed   By: Dorise Bullion III M.D   On: 09/28/2016 15:50   Dg Chest Port 1 View  Result Date: 09/29/2016 CLINICAL DATA:  Respiratory failure. EXAM: PORTABLE CHEST 1 VIEW COMPARISON:  09/28/2016.  FINDINGS: Cardiomegaly with normal pulmonary vascularity. Medial right base atelectasis and or infiltrate. No pleural effusion or pneumothorax. Cardiomegaly. No pneumothorax. Prior cervical spine fusion. IMPRESSION: 1.  Medial right base atelectasis and or infiltrate. 2. Stable cardiomegaly. Electronically Signed   By: Marcello Moores  Register   On: 09/29/2016 06:53     STUDIES:    CULTURES:   ANTIBIOTICS:   SIGNIFICANT EVENTS:   LINES/TUBES:   DISCUSSION: Complete heart block s/p temporary transvenous pacing. Refused permanent pacemaker in  past  ASSESSMENT / PLAN:  PULMONARY A: COPD P:   Supplemental O2 Duonebs Continue Spiriva Monitor for edema with brady Stress steroids needed, was on pred 5 (for pmr)  CARDIOVASCULAR A:  Complete heart block. Refused pacemaker in past S/p temporary Pacemaker Presumed some AI P:  Pacer wire To decide on perm pacer dop off Tele Add stress steriods  RENAL A:   AKI in setting of cardiogenic shock, ATN P:   Gentle fluid hydration, maintain saline Chem in am  Keep k wnl May need kay x 1  GASTROINTESTINAL A:   Elevated LFTs Shock liver P:   Monitor LFTs with hydration and MAP support RUQ [place] p Add diet if not getting perm pacer  HEMATOLOGIC A:   Leukocytosis (demargination) P:  Montior wbc dvt prevention, scd Would want sub q hep, pending procedure  INFECTIOUS A:   No clear evidence of infection Recent treatment with Z pack for URTI P:   Observe temp curve PCT at 2000 pcxr not impressive for me in terms of infiltrate and clinical status does not match PNA  ENDOCRINE A:   DM Elevated Blood sugars - now controlled ( likley did contirbute to brady with mild K ) Polymyagia rheumatica Hypothyroidism P:   Check TSH- 3.4 Continue synthyroid pred to stress steroids Continue home NPH ( when eating) SSI  NEUROLOGIC A:   Alzheimer dementia P:   Hold Namenda   FAMILY  - Updates: Daughter and family updated at bediside- will will discuss code status - Inter-disciplinary family meet or Palliative Care meeting due by:  11/26   Critical care time- 35 mins  Lavon Paganini. Titus Mould, MD, FACP Pgr: [facility]   I have had extensive discussions with family daughters. We discussed patients current circumstances and organ failures. We also discussed patient's prior wishes under circumstances such as this. Family has decided to NOT perform resuscitation if arrest but to continue current medical support for now.

## 2016-09-29 NOTE — Progress Notes (Signed)
Inpatient Diabetes Program Recommendations  AACE/ADA: New Consensus Statement on Inpatient Glycemic Control (2015)  Target Ranges:  Prepandial:   less than 140 mg/dL      Peak postprandial:   less than 180 mg/dL (1-2 hours)      Critically ill patients:  140 - 180 mg/dL   Lab Results  Component Value Date   GLUCAP 150 (H) 09/29/2016   HGBA1C 6.4 (H) 08/25/2016    Review of Glycemic Control  Diabetes history: DM 2 Outpatient Diabetes medications: NPH 20 units QAM, 5 units QPM, Humalog 0-6 units TID Current orders for Inpatient glycemic control: NPH 20 units QAM, 5 units QPM, Novolog Moderate Q4  Inpatient Diabetes Program Recommendations:   Patient had hypoglycemia 63 at 0450 this am. Consider reducing NPH to 10 units Daily.  Thanks,  Tama Headings RN, MSN, [facility] Inpatient Diabetes Coordinator Team Pager [phone] (8a-5p)

## 2016-09-29 NOTE — Progress Notes (Signed)
Site area: rt groin fv sheath Site Prior to Removal:  Level 0 Pressure Applied For: 10 minutes Manual:   yes Patient Status During Pull:  stable Post Pull Site:  Level 0 Post Pull Instructions Given:  Yes; arrived to holding w/knee immobilizer on Post Pull Pulses Present: yes Dressing Applied:  tegaderm Bedrest begins @ [id number] Comments:

## 2016-09-29 NOTE — Consult Note (Signed)
ELECTROPHYSIOLOGY CONSULT NOTE    Patient ID: Michelle [name] MRN: [id number], DOB/AGE: 11-23-1931 54 y.o.  Admit date: 09/28/2016 Date of Consult: 09/29/2016   Primary Physician: [name], MD Primary Cardiologist: Dr. Sallyanne Kuster  Reason for Consultation: CHB    HPI: Michelle [name] is a 82 y.o. female  PMHx of DM, HLD, asthma/COPD, and HTN, PMR, Hypothyroidism, and advanced dimentia, known hx of conduction system disease, historically had declined PPM, she last saw dr. Loletha Grayer it looks like in 2015, at that time he mentioned: " last year she was referred for significant bradycardia at rest, but her Holter monitor showed normal heart rate response to activity and so the decision was made not to pursue a pacemaker. She has been unable to take medications for dementia because of the bradycardia"  He felt she had clear evidence of sinus node dysfunction and recommended pacer implant but the patient wanted to discuss with family and does not appear she had f/u since that time.  She was recently hospitalized 08/21/16 with recurrent episodes of dizziness and lightheadedness with near syncope.  She had recently had a GI illness, followed by poor PO intake and trouble with her BS control marked hypoglycemia, hyperkalemia, metabolic abnormalities.  She required temp wire for CHB, pauses and profound bradycardia.  At that time, with correction of her labs conduction improved and in review of notes, Dr. Caryl Comes in discussion with family felt best to try and avoid PPM including concerns about the patient prior to her dementia had declined pacing.    She returns now with N/V, hyperglycemia, recently treated with erythromycin for URI (09/24/16).  Notes state that she was with cough, abdominal pain , when they woke and went to check in on her they noted large ammount of vomit, appeared to  Them overnight she must have vomited a number of times.  Her daughter checked her BS and got "high reading", gave her 15u  humalog, again got high reading, gave another 15u, and again "high reading" and called EMS, when they arrived, per the daughter at bedside BS was 536 (after the 30u of insulin).  They state the patient was awake, but lethargic and c/o not feeling well.  In ER was found to be in CHB with ventricular escape at 20bpm.  She had 2 episodes of syncope after complete heart block and also a few episodes of asystole.  Transfer[facility] from [facility] to [facility].  Labs showed markedly elevated LFTs c/w shock liver, acute on chronic kidney disease with creatinine 2.53, BS 373 and trop 0.28.  WBC 15.6.    LABS: WBC 15.6 >> 16.0 H/H 9/28 plts 304 K+ 5.3 >> 5.0 glucose 435 >> 90 BUN/Creat 43/2.53 >> 43/2.31 (baseline appears about 1-1.2) AST 1969 ALT 1118 TSH 3.413 Trop 0.28  Past Medical History:  Diagnosis Date  . Arthritis   . Asthma   . COPD (chronic obstructive pulmonary disease) ([place])   . Coronary atherosclerosis   . Dyslipidemia   . Gallstones   . History of fractured kneecap   . Hypothyroidism   . Interstitial cystitis   . Malaise and fatigue   . Memory loss   . Polymyalgia rheumatica ([place])   . Systemic hypertension   . Type II or unspecified type diabetes mellitus without mention of complication, not stated as uncontrolled   . UTI (lower urinary tract infection)      Surgical History:  Past Surgical History:  Procedure Laterality Date  . CARDIAC CATHETERIZATION  01/29/2006  10% luminal irregularity mid LAD  . CARDIAC CATHETERIZATION  03/15/2001   No significant CAD, no evidence of renal artery stenosis, systemic hypertenion  . CARDIAC CATHETERIZATION N/A 08/21/2016   Procedure: Temporary Pacemaker;  Surgeon: Jettie Booze, MD;  Location: [place] [facility];  Service: Cardiovascular;  Laterality: N/A;  . CARDIAC CATHETERIZATION N/A 09/28/2016   Procedure: Temporary Pacemaker;  Surgeon: Burnell Blanks, MD;  Location: [place] [facility];  Service: Cardiovascular;  Laterality: N/A;  .  CERVICAL SPINE SURGERY    . CHOLECYSTECTOMY    . HERNIA REPAIR    . LUMBAR DISC SURGERY    . NM MYOCAR PERF WALL MOTION  08/17/2009   normal  . ROTATOR CUFF REPAIR    . TONSILLECTOMY    . TOTAL ABDOMINAL HYSTERECTOMY    . US ECHOCARDIOGRAPHY  09/14/2007   mild LVH, LA mildly dilated,mild mitral annular calcification, AOV mildly sclerotic, aortic root sclerosis/ca+     Prescriptions Prior to Admission  Medication Sig Dispense Refill Last Dose  . acetaminophen (TYLENOL) 325 MG tablet Take 2 tablets (650 mg total) by mouth every 6 (six) hours as needed for mild pain (or Fever >/= 101). 30 tablet 0 More than a month at Unknown time  . albuterol (PROVENTIL HFA;VENTOLIN HFA) 108 (90 Base) MCG/ACT inhaler Inhale 1-2 puffs into the lungs every 4 (four) hours as needed for wheezing or shortness of breath. 1 Inhaler 0   . aspirin 81 MG tablet Take 81 mg by mouth daily.   09/25/2016 at Unknown time  . azithromycin (ZITHROMAX) 250 MG tablet 2 tabs po on day one, then one tablet po once daily on days 2-5. 6 tablet 0   . Calcium Carbonate-Vit D-Min (CALCIUM 1200 PO) Take 1 tablet by mouth daily.    09/25/2016 at Unknown time  . cefixime (SUPRAX) 100 MG/5ML suspension Take 13 mLs (260 mg total) by mouth daily. 50 mL 0 More than a month at Unknown time  . cefUROXime (CEFTIN) 250 MG tablet Take 1 tablet (250 mg total) by mouth daily. 30 tablet 5 09/25/2016 at Unknown time  . Cholecalciferol (VITAMIN D3) 2000 units TABS Take 2,000 Units by mouth daily.    09/25/2016 at Unknown time  . ciprofloxacin (CIPRO) 250 MG tablet Take 1 tablet (250 mg total) by mouth 2 (two) times daily. 14 tablet 0 Past Month at Unknown time  . feeding supplement, ENSURE ENLIVE, (ENSURE ENLIVE) LIQD Take 237 mLs by mouth 2 (two) times daily between meals. (Patient taking differently: Take 237 mLs by mouth daily. ) 237 mL 12 09/25/2016 at Unknown time  . fentaNYL (DURAGESIC - DOSED MCG/HR) 25 MCG/HR patch Place 25 mcg onto the skin every  3 (three) days.   0 09/25/2016 at Unknown time  . ferrous sulfate 325 (65 FE) MG tablet Take 325 mg by mouth daily with breakfast.   More than a month at Unknown time  . HYDROcodone-acetaminophen (NORCO/VICODIN) 5-325 MG per tablet Take 1 tablet by mouth every 6 (six) hours as needed for moderate pain.    09/25/2016 at Unknown time  . insulin lispro (HUMALOG) 100 UNIT/ML injection Inject 0-0.06 mLs (0-6 Units total) into the skin 3 (three) times daily as needed for high blood sugar. Patient uses sliding scale 10 mL 5 09/25/2016 at Unknown time  . insulin NPH Human (HUMULIN N,NOVOLIN N) 100 UNIT/ML injection Inject 0.18 mLs (18 Units total) into the skin every morning. (Patient taking differently: Inject 5-20 Units into the skin 2 (two)  times daily. 20 units in the morning and 5 units in the evening) 10 mL 2 09/25/2016 at Unknown time  . Insulin Syringe-Needle U-100 31G X 5/16" 1 ML MISC For injection of insulin QID 100 each 5 09/25/2016 at Unknown time  . levothyroxine (SYNTHROID, LEVOTHROID) 100 MCG tablet TAKE 1 TABLET (100 MCG TOTAL) BY MOUTH DAILY. 90 tablet 1 09/25/2016 at Unknown time  . Multiple Vitamins-Minerals (OCUVITE PRESERVISION PO) Take 1 capsule by mouth daily.   09/25/2016 at Unknown time  . ONE TOUCH ULTRA TEST test strip TEST BS 5-6 TIMES A DAY 200 each 6 09/25/2016 at Unknown time  . predniSONE (DELTASONE) 5 MG tablet Take 1 tablet by mouth daily.  3 09/25/2016 at Unknown time  . psyllium (HYDROCIL/METAMUCIL) 95 % PACK Take 1 packet by mouth 3 (three) times daily.   09/25/2016 at Unknown time  . tamsulosin (FLOMAX) 0.4 MG CAPS capsule Take 1 capsule (0.4 mg total) by mouth daily after breakfast. 30 capsule 0 09/25/2016 at Unknown time  . telmisartan (MICARDIS) 40 MG tablet Take 1 tablet (40 mg total) by mouth daily. 30 tablet 5 More than a month at Unknown time  . tiotropium (SPIRIVA HANDIHALER) 18 MCG inhalation capsule Place 1 capsule (18 mcg total) into inhaler and inhale daily. 90  capsule 3 09/25/2016 at Unknown time  . UNABLE TO FIND Take 1 tablet by mouth daily. Med Name: probiotic capsule   Takes one daily.    Taking  . vitamin B-12 100 MCG tablet Take 1 tablet (100 mcg total) by mouth daily. 30 tablet 0 09/25/2016 at Unknown time    Inpatient Medications:  . aspirin EC  81 mg Oral Daily  . ferrous sulfate  325 mg Oral Q breakfast  . insulin aspart  0-15 Units Subcutaneous Q4H  . insulin NPH Human  20 Units Subcutaneous QAC breakfast   And  . insulin NPH Human  5 Units Subcutaneous QHS  . levothyroxine  100 mcg Oral QAC breakfast  . mouth rinse  15 mL Mouth Rinse BID  . predniSONE  5 mg Oral Daily  . sodium chloride flush  3 mL Intravenous Q12H  . tiotropium  18 mcg Inhalation Daily  . white petrolatum        Allergies: No Known Allergies  Social History   Social History  . Marital status: Widowed    Spouse name: N/A  . Number of children: 4  . Years of education: 12   Occupational History  . retired Retired   Social History Main Topics  . Smoking status: Never Smoker  . Smokeless tobacco: Never Used  . Alcohol use No  . Drug use: No  . Sexual activity: Not on file   Other Topics Concern  . Not on file   Social History Narrative   Patient lives at home alone. Widowed.   Retired.   Education High school   Right handed   Caffeine- some times coffee and soda one cup.     Family History  Problem Relation Age of Onset  . Breast cancer Mother   . Heart disease Father   . Lung cancer Brother      Review of Systems: All other systems reviewed and are otherwise negative except as noted above.  Physical Exam: Vitals:   09/29/16 0400 09/29/16 0500 09/29/16 0600 09/29/16 0700  BP: (!) 178/59 (!) 163/53  (!) 186/60  Pulse: (!) 59 60  66  Resp: 13 16 17 13   Temp:  TempSrc:      SpO2: 100% 100%  100%  Weight:      Height:        GEN- The patient is well appearing, alert conversational, follows directions HEENT: normocephalic,  atraumatic; sclera clear, conjunctiva pink; hearing intact; oropharynx clear; neck supple, no JVP Lymph- no cervical lymphadenopathy Lungs- Clear to ausculation bilaterally, normal work of breathing.  No wheezes, rales, rhonchi Heart- Regular rate and rhythm, no murmurs, rubs or gallops, PMI not laterally displaced GI- soft, non-tender, non-distended, bowel sounds present Extremities- no clubbing, cyanosis, or edema MS- no significant deformity or atrophy Skin- warm and dry, no rash or lesion Psych- euthymic mood, full affect Neuro- no gross deficits observed  R groin, temp wire site stable  Labs:   Lab Results  Component Value Date   WBC 16.0 (H) 09/29/2016   HGB 9.5 (L) 09/29/2016   HCT 28.0 (L) 09/29/2016   MCV 91.2 09/29/2016   PLT 304 09/29/2016    Recent Labs Lab 09/28/16 1551 09/29/16 0211  NA 131* 134*  K 5.3* 5.0  CL 96* 102  CO2 20* 22  BUN 43* 43*  CREATININE 2.53* 2.31*  CALCIUM 8.9 8.8*  PROT 6.3*  --   BILITOT 0.4  --   ALKPHOS 115  --   ALT 1,118*  --   AST 1,969*  --   GLUCOSE 373* 90      Radiology/Studies:  Dg Chest 2 View Result Date: 09/28/2016 CLINICAL DATA:  Nausea and vomiting. EXAM: CHEST  2 VIEW COMPARISON:  September 25, 2016 FINDINGS: Stable cardiomegaly. The hila and mediastinum are unchanged. Mild increased interstitial markings. No other interval changes or acute abnormalities. IMPRESSION: Mild increased interstitial markings may represent pulmonary venous congestion versus bronchitic changes. Recommend clinical correlation. Electronically Signed   By: Dorise Bullion III M.D   On: 09/28/2016 15:50   Dg Chest Port 1 View Result Date: 09/29/2016 CLINICAL DATA:  Respiratory failure. EXAM: PORTABLE CHEST 1 VIEW COMPARISON:  09/28/2016. FINDINGS: Cardiomegaly with normal pulmonary vascularity. Medial right base atelectasis and or infiltrate. No pleural effusion or pneumothorax. Cardiomegaly. No pneumothorax. Prior cervical spine fusion.  IMPRESSION: 1.  Medial right base atelectasis and or infiltrate. 2. Stable cardiomegaly. Electronically Signed   By: Marcello Moores  Register   On: 09/29/2016 06:53    EKG:  SR, 63bpm, PR 14ms, QRS 173ms, QTc 461 >>> CHB rates 20's TELEMETRY (reviewed by myself): SR 60's this morning, intermittent V pacing yesterday/overnight, upon admission CHB with pauses up to 7-8 seconds  08/22/16: TTE Study Conclusions - Left ventricle: The cavity size was normal. Wall thickness was   normal. Systolic function was mildly reduced. The estimated   ejection fraction was in the range of 45% to 50%. Akinesis of the   apical myocardium; consistent with infarction in the distribution   of the left anterior descending coronary artery. Hypokinesis of   the anteroseptal, anterior, inferoseptal, and apical myocardium.   Features are consistent with a pseudonormal left ventricular   filling pattern, with concomitant abnormal relaxation and   increased filling pressure (grade 2 diastolic dysfunction). - Aortic valve: There was mild stenosis. Valve area (VTI): 1.54   cm^2. Valve area (Vmax): 1.57 cm^2. Valve area (Vmean): 1.78   cm^2. - Mitral valve: Moderately calcified annulus. There was mild   regurgitation. - Left atrium: The atrium was severely dilated. - Right atrium: The atrium was severely dilated. - Tricuspid valve: There was mild-moderate regurgitation directed   centrally. - Pulmonary arteries: Systolic  pressure was moderately increased.   PA peak pressure: 53 mm Hg (S). - Line: A venous catheter was visualized in the superior vena cava,   with its tip in the right atrium. No abnormal features noted.  Assessment and Plan:   1. CHB, pauses as long as 7-8 seconds     S/p temp pacing wire     Currently SR 60's     Historically patient has not wanted PPM     I do not see any rate limiting/nodal blocking agents, home or in-patient meds  2. Shock 2/2 #1      Marked LFT elevation      ARI      BP  stable  3. DM      4. Cough/cold symptoms last week     treated with erythrmomycin 09/24/16 from [facility]     xr this AM RML infiltrate vs atelectasis     Leukocytosis, WBC this morning is 16     afebrile  5. ALzheimer's Dementia     ? End stage mentioned in previous records     Discussion on palliative care though has not yet happened  Given WBC, will await pulm/CCM ? pneumonia, stable HR, BP Discussed with family, will await Pulm/CCM evaluation, Dr. Titus Mould has low suspicion, will see her this morning  Further pending Dr. Lovena Le.    Venetia Night, PA-C 09/29/2016 8:44 AM  EP Attending  Patient seen and examined. Agree with above. The patient has a very difficult combination of problems including complete heart block, dementia, uncontrolled DM, nausea and vomiting. She was thought to have pneumonia last week but she has no fever and cxr is non-diagnostic. Her CCM MD does not think she has active pneumonia. She has had a long h/o heart block in the past and had refused PPM. She and especially her family would like to proceed with PPM insertion. I have discussed the risks/benefits/goals/expectations and including the increased risks of infection with our treatment. They wish to proceed.  Mikle Bosworth.D.

## 2016-09-30 ENCOUNTER — Inpatient Hospital Stay (HOSPITAL_COMMUNITY): Payer: Medicare Other

## 2016-09-30 ENCOUNTER — Encounter (HOSPITAL_COMMUNITY): Payer: Self-pay | Admitting: Internal Medicine

## 2016-09-30 LAB — GLUCOSE, CAPILLARY
GLUCOSE-CAPILLARY: 124 mg/dL — AB (ref 65–99)
GLUCOSE-CAPILLARY: 154 mg/dL — AB (ref 65–99)
GLUCOSE-CAPILLARY: 165 mg/dL — AB (ref 65–99)
GLUCOSE-CAPILLARY: 243 mg/dL — AB (ref 65–99)
GLUCOSE-CAPILLARY: 300 mg/dL — AB (ref 65–99)
Glucose-Capillary: 327 mg/dL — ABNORMAL HIGH (ref 65–99)
Glucose-Capillary: 146 mg/dL — ABNORMAL HIGH (ref 65–99)

## 2016-09-30 LAB — BASIC METABOLIC PANEL
ANION GAP: 12 (ref 5–15)
BUN: 36 mg/dL — AB (ref 6–20)
CHLORIDE: 99 mmol/L — AB (ref 101–111)
CO2: 17 mmol/L — ABNORMAL LOW (ref 22–32)
Calcium: 7.9 mg/dL — ABNORMAL LOW (ref 8.9–10.3)
Creatinine, Ser: 1.45 mg/dL — ABNORMAL HIGH (ref 0.44–1.00)
GFR calc Af Amer: 38 mL/min — ABNORMAL LOW (ref >60)
GFR, EST NON AFRICAN AMERICAN: 33 mL/min — AB (ref >60)
GLUCOSE: 293 mg/dL — AB (ref 65–99)
POTASSIUM: 4.3 mmol/L (ref 3.5–5.1)
SODIUM: 128 mmol/L — AB (ref 135–145)

## 2016-09-30 LAB — PROCALCITONIN: PROCALCITONIN: 4.51 ng/mL

## 2016-09-30 MED ORDER — FUROSEMIDE 10 MG/ML IJ SOLN
20.0000 mg | Freq: Once | INTRAMUSCULAR | Status: AC
  Administered 2016-09-30: 20 mg via INTRAVENOUS
  Filled 2016-09-30: qty 2

## 2016-09-30 MED ORDER — POLYETHYLENE GLYCOL 3350 17 G PO PACK
17.0000 g | PACK | Freq: Every day | ORAL | Status: DC | PRN
  Filled 2016-09-30: qty 1

## 2016-09-30 MED ORDER — ACETYLCYSTEINE 20 % IN SOLN
4.0000 mL | Freq: Two times a day (BID) | RESPIRATORY_TRACT | Status: AC
  Administered 2016-09-30 – 2016-10-01 (×3): 4 mL via RESPIRATORY_TRACT
  Filled 2016-09-30 (×4): qty 4

## 2016-09-30 MED ORDER — HYDROCORTISONE NA SUCCINATE PF 100 MG IJ SOLR
25.0000 mg | Freq: Every day | INTRAMUSCULAR | Status: DC
  Administered 2016-10-01 – 2016-10-03 (×3): 25 mg via INTRAVENOUS
  Filled 2016-09-30 (×3): qty 2

## 2016-09-30 NOTE — Progress Notes (Addendum)
PULMONARY / CRITICAL CARE MEDICINE   Name: Michelle [name] MRN: [id number] DOB: 1932/09/30    ADMISSION DATE:  09/28/2016 CONSULTATION DATE: 09/28/16  REFERRING MD:    CHIEF COMPLAINT: Complete heart block  HISTORY OF PRESENT ILLNESS:   82 year old with past medical history of asthma, COPD, CAD, diabetes, Alzheimers dementia with known history of conduction system disease, sinus node disease. She has declined pacemaker in the past. She was admitted and November with hyperglycemia, transient complete heart block which was started on epinephrine infusion with resolution of heart block.  She returns to the ED with nausea, vomiting, hyperglycemia. She was found to be in complete heart block in ED with a ventricular escape rythm at 20 bpm with periods of asystole in the ED. She was transferred to [facility] from [facility] and temporary pacemaker placed. PCCM consulted for admission.  SUBJECTIVE: asleep with family at bedside   VITAL SIGNS: BP (!) 159/69 (BP Location: Right Arm)   Pulse 66   Temp 98.4 F (36.9 C) (Oral)   Resp 20   Ht 5' (1.524 m)   Wt 119 lb 14.9 oz (54.4 kg)   SpO2 100%   BMI 23.42 kg/m   HEMODYNAMICS:    VENTILATOR SETTINGS:    INTAKE / OUTPUT: I/O last 3 completed shifts: In: 1921.5 [P.O.:360; I.V.:1561.5] Out: -   PHYSICAL EXAMINATION: General:  Somnolent, arousable Neuro:  No gross focal deficits HEENT:  Moist mucus membranes, NO JVD Cardiovascular: Paced rhythm, No MRG Lungs:  Coarse, crackles per bases. Abdomen:  Soft, + BS, no r/g Musculoskeletal:  Normal tone and bulk Skin:  Intact  LABS:  BMET  Recent Labs Lab 09/28/16 1551 09/29/16 0211 09/29/16 1953  NA 131* 134* 136  K 5.3* 5.0 5.8*  CL 96* 102 103  CO2 20* 22 21*  BUN 43* 43* 35*  CREATININE 2.53* 2.31* 1.65*  GLUCOSE 373* 90 179*    Electrolytes  Recent Labs Lab 09/28/16 1551 09/29/16 0211 09/29/16 1953  CALCIUM 8.9 8.8* 8.9  MG  --  2.0  --   PHOS  --  4.4   --     CBC  Recent Labs Lab 09/28/16 1551 09/29/16 0211  WBC 15.6* 16.0*  HGB 9.4* 9.5*  HCT 27.4* 28.0*  PLT 354 304    Coag's  Recent Labs Lab 09/29/16 1953  INR 1.24    Sepsis Markers  Recent Labs Lab 09/29/16 1953 09/30/16 0235  PROCALCITON 4.67 4.51    ABG No results for input(s): PHART, PCO2ART, PO2ART in the last 168 hours.  Liver Enzymes  Recent Labs Lab 09/28/16 1551 09/29/16 1953  AST 1,969* 867*  ALT 1,118* 991*  ALKPHOS 115 128*  BILITOT 0.4 0.4  ALBUMIN 3.6 3.5    Cardiac Enzymes No results for input(s): TROPONINI, PROBNP in the last 168 hours.  Glucose  Recent Labs Lab 09/29/16 1823 09/29/16 2123 09/30/16 0005 09/30/16 0413 09/30/16 0946 09/30/16 1127  GLUCAP 134* 216* 243* 154* 300* 327*    Imaging Dg Chest Port 1 View  Result Date: 09/30/2016 CLINICAL DATA:  Bradycardia, history asthma, COPD, type II diabetes mellitus, hypertension, coronary artery disease EXAM: PORTABLE CHEST 1 VIEW COMPARISON:  Portable exam 0556 hours compared to 09/29/2016 FINDINGS: LEFT subclavian transvenous pacemaker leads project over RIGHT atrium and RIGHT ventricle. Enlargement of cardiac silhouette. Calcified tortuous thoracic aorta. Mild pulmonary vascular congestion. Bronchitic changes with minimal RIGHT basilar atelectasis. Lungs otherwise clear without infiltrate, pleural effusion, or pneumothorax. Osseous demineralization. Prior cervicothoracic fusion.  IMPRESSION: Enlargement of cardiac silhouette with pulmonary vascular congestion. Bronchitic changes with mild RIGHT basilar atelectasis. Electronically Signed   By: Lavonia Dana M.D.   On: 09/30/2016 07:54   Dg Chest Port 1 View  Result Date: 09/29/2016 CLINICAL DATA:  Postoperative radiograph, status post pacemaker placement. Initial encounter. EXAM: PORTABLE CHEST 1 VIEW COMPARISON:  Chest radiograph performed earlier today at 3:59 a.m. FINDINGS: The lungs are well-aerated and clear. There is no  evidence of focal opacification, pleural effusion or pneumothorax. The cardiomediastinal silhouette is mildly enlarged. A pacemaker is noted overlying the left chest wall, with leads ending overlying the right atrium and right ventricle. No acute osseous abnormalities are seen. Clips are noted within the right upper quadrant, reflecting prior cholecystectomy. Cervical spinal fusion hardware is partially imaged. IMPRESSION: Mild cardiomegaly.  Lungs remain grossly clear. Electronically Signed   By: Garald Balding M.D.   On: 09/29/2016 20:34   US Abdomen Limited Ruq  Result Date: 09/29/2016 CLINICAL DATA:  Acute onset of elevated LFTs. Nausea and vomiting. Initial encounter. EXAM: US ABDOMEN LIMITED - RIGHT UPPER QUADRANT COMPARISON:  None. FINDINGS: Gallbladder: Status post cholecystectomy.  No retained stones seen. Common bile duct: Diameter: 0.5 cm, within normal limits in caliber. Liver: No focal lesion identified. Within normal limits in parenchymal echogenicity. IMPRESSION: Unremarkable ultrasound of the right upper quadrant. Status post cholecystectomy. Electronically Signed   By: Garald Balding M.D.   On: 09/29/2016 21:44     STUDIES:    CULTURES:   ANTIBIOTICS:   SIGNIFICANT EVENTS: 09/29/16>> PPM placed per Dr. Lovena Le, temp wires removed  LINES/TUBES: PIV  DISCUSSION: Complete heart block s/p temporary transvenous pacing. Refused permanent pacemaker in past. 11/20 PPM placed by Dr. Lovena Le, with removal of temp pacing wires.  ASSESSMENT / PLAN:  PULMONARY A: COPD CXR  Enlargement of cardiac silhouette with pulmonary vascular congestion. Bronchitic changes with mild RIGHT basilar atelectasis.  P:   Supplemental O2 for saturations >93% Duonebs Continue Spiriva CXR 11/22 Stress steroids needed, was on pred 5 (for pmr) OOB as tolerated Pulmonary Toilet as able PT/OT to mobilize when appropriate  CARDIOVASCULAR A:  Complete heart block. S/p temporary Pacemaker PPM  placed 11/20 Site stable/ device check this am with intact function SR in 60's Sling in place P:  Given dementia , sling placed Tele  Stress Steriods started 11/20, can reduce BNP 11/22  RENAL A:   AKI in setting of cardiogenic shock, ATN Hyperkalemia Creatinine downtrending P:   Gentle fluid hydration, maintain saline BMET now  CMET 11/22  Keep k wnl May need kay x 1 if still elevated   GASTROINTESTINAL A:   Elevated LFTs>> downtrending Shock liver P:   Monitor LFTs with hydration and MAP support RUQ [place]  Heart healthy diet Swallow eval and treat  HEMATOLOGIC A:   Leukocytosis (demargination) No active bleeding P:  Montior wbc Trend fever curve dvt prevention, scd    INFECTIOUS A:   No clear evidence of infection Recent treatment with Z pack for URTI PPM site unremarkable T max 99.4 P:   Observe temp curve Trend WBC PCT  11/22 CBC 11/22   ENDOCRINE A:   DM Elevated Blood sugars -( likley did contirbute to brady with mild K and steroids ) Polymyagia rheumatica Hypothyroidism P:   TSH- 3.4 Continue synthyroid pred to stress steroids Continue home NPH ( when eating) SSI Diabetes coordinator consult  NEUROLOGIC A:   Alzheimer dementia P:   Hold Namenda   FAMILY  -  Updates: Daughter and family updated at bedside- Made DNR 11/20 - Inter-disciplinary family meet or Palliative Care meeting due by:  11/26   Magdalen Spatz, AGACNP-BC [place] Pager # [phone] 09/30/2016   STAFF NOTE: Linwood Dibbles, MD FACP have personally reviewed patient's available data, including medical history, events of note, physical examination and test results as part of my evaluation. I have discussed with resident/NP and other care providers such as pharmacist, RN and RRT. In addition, I personally evaluated patient and elicited key findings of: awakens, no distress, NO ronchi, NO hweezing, perm wire placed tolerated  well, pcxr neg for infiltrate on repeat, no sig fevers, PCT flat in setting ARF with a low crt clearance given age and crt = less suspicious for PNA, in am pct may even drip further as renal fxn resolves, no indication ABX, kvo, reduce steroids back to pred stable 5 mg dose when able, reudce IV fo rnow, sling x 2 days  Per EP, to triad, LFT are resolving with brady treated, [place] re assuring also abdo/ ruq, I have a worry for poor pulm toileting, she has a weak cough, add mucomysts x 2 days, IS important, PT, SLP, pulse ox I updated duahghters  Lavon Paganini. Titus Mould, MD, [place] Pgr: [facility] 09/30/2016 2:02 PM

## 2016-09-30 NOTE — Progress Notes (Signed)
SUBJECTIVE: The patient is doing well today.  At this time, she denies chest pain, shortness of breath, or any new concerns.  Marland Kitchen aspirin EC  81 mg Oral Daily  . carvedilol  6.25 mg Oral BID WC  . carvedilol  6.25 mg Oral Once  .  ceFAZolin (ANCEF) IV  1 g Intravenous Q8H  . ferrous sulfate  325 mg Oral Q breakfast  . hydrocortisone sodium succinate  50 mg Intravenous Q6H  . insulin aspart  0-15 Units Subcutaneous Q4H  . insulin NPH Human  20 Units Subcutaneous QAC breakfast   And  . insulin NPH Human  5 Units Subcutaneous QHS  . levothyroxine  100 mcg Oral QAC breakfast  . mouth rinse  15 mL Mouth Rinse BID  . sodium chloride flush  3 mL Intravenous Q12H  . tiotropium  18 mcg Inhalation Daily   . sodium chloride 75 mL/hr at 09/30/16 0550    OBJECTIVE: Physical Exam: Vitals:   09/29/16 1854 09/29/16 2025 09/29/16 2106 09/30/16 0415  BP: (!) 197/66 (!) 133/48 (!) 138/59 (!) 159/69  Pulse:  69 73 66  Resp:  15 17 20   Temp: 98.1 F (36.7 C) 98.4 F (36.9 C) 98 F (36.7 C) 98.4 F (36.9 C)  TempSrc: Oral Oral Oral Oral  SpO2: 93% 97% 95% 98%  Weight:      Height:        Intake/Output Summary (Last 24 hours) at 09/30/16 0755 Last data filed at 09/29/16 2230  Gross per 24 hour  Intake           906.25 ml  Output                0 ml  Net           906.25 ml    Telemetry reviewed by myself is SR with infrequent pacing  GEN- The patient is elderly but stable appearing, alert and very conversational  Head- normocephalic, atraumatic Eyes-  Sclera clear, conjunctiva pink Ears- hearing intact Oropharynx- clear Neck- supple, no JVP Lungs- Clear to ausculation bilaterally, normal work of breathing Heart- Regular rate and rhythm, no significant murmurs, no rubs or gallops GI- soft, NT, ND Extremities- no clubbing, cyanosis, or edema Skin- no rash or lesion Psych- euthymic mood, full affect Neuro- no gross deficits appreciated  PPM implant site is stable, dry, no  hematoma Temp wire site R groin is sift, non-tender, no hematoma  LABS: Basic Metabolic Panel:  Recent Labs  09/29/16 0211 09/29/16 1953  NA 134* 136  K 5.0 5.8*  CL 102 103  CO2 22 21*  GLUCOSE 90 179*  BUN 43* 35*  CREATININE 2.31* 1.65*  CALCIUM 8.8* 8.9  MG 2.0  --   PHOS 4.4  --    Liver Function Tests:  Recent Labs  09/28/16 1551 09/29/16 1953  AST 1,969* 867*  ALT 1,118* 991*  ALKPHOS 115 128*  BILITOT 0.4 0.4  PROT 6.3* 6.6  ALBUMIN 3.6 3.5    Recent Labs  09/28/16 1551  LIPASE 28   CBC:  Recent Labs  09/28/16 1551 09/29/16 0211  WBC 15.6* 16.0*  NEUTROABS 13.4*  --   HGB 9.4* 9.5*  HCT 27.4* 28.0*  MCV 92.3 91.2  PLT 354 304   Thyroid Function Tests:  Recent Labs  09/29/16 0211  TSH 3.413     ASSESSMENT AND PLAN:   1. CHB, pauses as long as 7-8 seconds     S/p PPM  implant yesterday with Dr. Lovena Le and removal of temp pacing wire     Currently SR 60's     PPM site is stable     written wound care and activity restrictions have been provided and discussed with daughter at bedside     Given dementia, keep sling on, for the next day or 2, OK to remove when supervised as discussed with daughter     Device check this morning with intact function     CXR reviewed with stable lead placement, pending official radiology read     Routine post pacer follow has been arranged  OOB is OK from our standpoint, OK to remove knee immobilizer EP remains available, recall if needed Further care deferred to attending service  2. Shock 2/2 #1      Marked LFT elevation improving      ARI, improving      BP high, meds started yesterday 3. DM 4. Cough/cold symptoms last week     treated with erythrmomycin 09/24/16 from [facility]     leukocutosis     not felt to be pneumonia or with an active infection in d/w attending yesterday     Pt states cough is improved, denies SOB 5. Alzheimer's Dementia 6. Hyperkalemia     Tommye Standard, PA-C 09/30/2016 7:55  AM  EP Attending  Looks much clearer today. She is doing well from [facility] perspective. I would encourage she be aggressively treated for any infection if it is thought that she has one that is partially treated. We will arrange outpatient followup.  Mikle Bosworth.D.

## 2016-09-30 NOTE — Progress Notes (Signed)
Patient is refusing breathing treatment at this time. Meds have already been scanned and placed in neb cup. RT tried to explain the process of this treatment and why it is important for her to receive it but patient continues to refuse. RT will continue to monitor.

## 2016-09-30 NOTE — Discharge Instructions (Signed)
° ° °  Supplemental Discharge Instructions for  Pacemaker/Defibrillator Patients  Activity No heavy lifting or vigorous activity with your left/right arm for 6 to 8 weeks.  Do not raise your left/right arm above your head for one week.  Gradually raise your affected arm as drawn below.             10/03/16                   10/04/16                  10/05/16                10/06/16 __  NO DRIVING the patient does not drive.  WOUND CARE - Keep the wound area clean and dry.  Do not get this area wet for one week. No showers for one week; you may shower on 10/06/16     . - The tape/steri-strips on your wound will fall off; do not pull them off.  No bandage is needed on the site.  DO  NOT apply any creams, oils, or ointments to the wound area. - If you notice any drainage or discharge from the wound, any swelling or bruising at the site, or you develop a fever > 101? F after you are discharged home, call the office at once.  Special Instructions - You are still able to use cellular telephones; use the ear opposite the side where you have your pacemaker/defibrillator.  Avoid carrying your cellular phone near your device. - When traveling through airports, show security personnel your identification card to avoid being screened in the metal detectors.  Ask the security personnel to use the hand wand. - Avoid arc welding equipment, MRI testing (magnetic resonance imaging), TENS units (transcutaneous nerve stimulators).  Call the office for questions about other devices. - Avoid electrical appliances that are in poor condition or are not properly grounded. - Microwave ovens are safe to be near or to operate.  Additional information for defibrillator patients should your device go off: - If your device goes off ONCE and you feel fine afterward, notify the device clinic nurses. - If your device goes off ONCE and you do not feel well afterward, call 911. - If your device goes off TWICE, call  911. - If your device goes off THREE times in one day, call 911.  DO NOT DRIVE YOURSELF OR A FAMILY MEMBER WITH A DEFIBRILLATOR TO THE HOSPITAL--CALL 911.

## 2016-09-30 NOTE — Progress Notes (Addendum)
Inpatient Diabetes Program Recommendations  AACE/ADA: New Consensus Statement on Inpatient Glycemic Control (2015)  Target Ranges:  Prepandial:   less than 140 mg/dL      Peak postprandial:   less than 180 mg/dL (1-2 hours)      Critically ill patients:  140 - 180 mg/dL   Lab Results  Component Value Date   GLUCAP 327 (H) 09/30/2016   HGBA1C 6.4 (H) 08/25/2016    Review of Glycemic Control  Results for [name], [name] (MRN [id number]) as of 09/30/2016 15:46  Ref. Range 09/29/2016 21:23 09/30/2016 00:05 09/30/2016 04:13 09/30/2016 09:46 09/30/2016 11:27  Glucose-Capillary Latest Ref Range: 65 - 99 mg/dL 216 (H) 243 (H) 154 (H) 300 (H) 327 (H)    Diabetes history: Type 2 Outpatient Diabetes medications: Humalog  0-6 units tid, Humulin N 18 units qam (taking 20 units NPH qam, 5 units NPH qpm) Current orders for Inpatient glycemic control: NPH 20 units qam, NPH 5 units qpm, Novolog 0-15 units q4h  * IV steroids  Inpatient Diabetes Program Recommendations:  Agree with current orders for blood sugar management.Please ensure insulin is given at the times the insulin is ordered- this will likely be sufficient to manage her blood sugars.    0800 insulins (NPH and Novolog) were given at 1036 am and then 12 noon Novolog was given at 1229pm- only 2 hours apart.  The Novolog being given too close together does not adequately control blood sugars and may increase risk of hypoglycemia. Gentry Fitz, RN, BA, MHA, CDE Diabetes Coordinator Inpatient Diabetes Program  [phone] (Team Pager) [phone] ([place]) 09/30/2016 3:54 PM

## 2016-10-01 ENCOUNTER — Inpatient Hospital Stay (HOSPITAL_COMMUNITY): Payer: Medicare Other

## 2016-10-01 LAB — PROCALCITONIN: Procalcitonin: 2.3 ng/mL

## 2016-10-01 LAB — GLUCOSE, CAPILLARY
GLUCOSE-CAPILLARY: 165 mg/dL — AB (ref 65–99)
GLUCOSE-CAPILLARY: 78 mg/dL (ref 65–99)
GLUCOSE-CAPILLARY: 91 mg/dL (ref 65–99)
Glucose-Capillary: 72 mg/dL (ref 65–99)
Glucose-Capillary: 105 mg/dL — ABNORMAL HIGH (ref 65–99)
Glucose-Capillary: 82 mg/dL (ref 65–99)
Glucose-Capillary: 102 mg/dL — ABNORMAL HIGH (ref 65–99)

## 2016-10-01 LAB — CBC
HCT: 26.9 % — ABNORMAL LOW (ref 36.0–46.0)
Hemoglobin: 9.1 g/dL — ABNORMAL LOW (ref 12.0–15.0)
MCH: 30.7 pg (ref 26.0–34.0)
MCHC: 33.8 g/dL (ref 30.0–36.0)
MCV: 90.9 fL (ref 78.0–100.0)
Platelets: 235 10*3/uL (ref 150–400)
RBC: 2.96 MIL/uL — ABNORMAL LOW (ref 3.87–5.11)
RDW: 14.2 % (ref 11.5–15.5)
WBC: 10.3 10*3/uL (ref 4.0–10.5)

## 2016-10-01 LAB — COMPREHENSIVE METABOLIC PANEL
ALBUMIN: 2.6 g/dL — AB (ref 3.5–5.0)
ALT: 304 U/L — ABNORMAL HIGH (ref 14–54)
ANION GAP: 9 (ref 5–15)
AST: 360 U/L — ABNORMAL HIGH (ref 15–41)
Alkaline Phosphatase: 91 U/L (ref 38–126)
BUN: 26 mg/dL — ABNORMAL HIGH (ref 6–20)
CHLORIDE: 99 mmol/L — AB (ref 101–111)
CO2: 23 mmol/L (ref 22–32)
Calcium: 8 mg/dL — ABNORMAL LOW (ref 8.9–10.3)
Creatinine, Ser: 1.06 mg/dL — ABNORMAL HIGH (ref 0.44–1.00)
GFR calc non Af Amer: 48 mL/min — ABNORMAL LOW (ref >60)
GFR, EST AFRICAN AMERICAN: 56 mL/min — AB (ref >60)
GLUCOSE: 66 mg/dL (ref 65–99)
POTASSIUM: 3.9 mmol/L (ref 3.5–5.1)
SODIUM: 131 mmol/L — AB (ref 135–145)
Total Bilirubin: 0.6 mg/dL (ref 0.3–1.2)
Total Protein: 5 g/dL — ABNORMAL LOW (ref 6.5–8.1)

## 2016-10-01 LAB — BRAIN NATRIURETIC PEPTIDE: B Natriuretic Peptide: 1656.8 pg/mL — ABNORMAL HIGH (ref 0.0–100.0)

## 2016-10-01 LAB — LIPASE, BLOOD: LIPASE: 30 U/L (ref 11–51)

## 2016-10-01 MED ORDER — ONDANSETRON HCL 4 MG/2ML IJ SOLN
4.0000 mg | Freq: Once | INTRAMUSCULAR | Status: AC
  Administered 2016-10-01: 4 mg via INTRAMUSCULAR
  Filled 2016-10-01: qty 2

## 2016-10-01 MED ORDER — FENTANYL 25 MCG/HR TD PT72
25.0000 ug | MEDICATED_PATCH | TRANSDERMAL | Status: DC
  Administered 2016-10-01: 25 ug via TRANSDERMAL
  Filled 2016-10-01 (×2): qty 1

## 2016-10-01 MED ORDER — GLUCERNA SHAKE PO LIQD
237.0000 mL | Freq: Three times a day (TID) | ORAL | Status: DC
  Administered 2016-10-01 – 2016-10-02 (×4): 237 mL via ORAL

## 2016-10-01 MED ORDER — PRO-STAT SUGAR FREE PO LIQD
30.0000 mL | Freq: Two times a day (BID) | ORAL | Status: DC
  Administered 2016-10-01 – 2016-10-04 (×5): 30 mL via ORAL
  Filled 2016-10-01 (×5): qty 30

## 2016-10-01 MED ORDER — HYDRALAZINE HCL 20 MG/ML IJ SOLN
10.0000 mg | Freq: Three times a day (TID) | INTRAMUSCULAR | Status: DC | PRN
  Administered 2016-10-01: 10 mg via INTRAVENOUS
  Filled 2016-10-01: qty 1

## 2016-10-01 MED ORDER — MECLIZINE HCL 12.5 MG PO TABS
12.5000 mg | ORAL_TABLET | Freq: Three times a day (TID) | ORAL | Status: DC | PRN
  Administered 2016-10-02: 12.5 mg via ORAL
  Filled 2016-10-01 (×2): qty 1

## 2016-10-01 MED ORDER — FAMOTIDINE IN NACL 20-0.9 MG/50ML-% IV SOLN
20.0000 mg | INTRAVENOUS | Status: DC
  Administered 2016-10-01: 20 mg via INTRAVENOUS
  Filled 2016-10-01: qty 50

## 2016-10-01 MED ORDER — FAMOTIDINE 20 MG PO TABS
20.0000 mg | ORAL_TABLET | Freq: Every day | ORAL | Status: DC
  Administered 2016-10-02 – 2016-10-04 (×3): 20 mg via ORAL
  Filled 2016-10-01 (×3): qty 1

## 2016-10-01 MED ORDER — DEXTROSE 5 % IV SOLN
INTRAVENOUS | Status: DC
  Administered 2016-10-01: 12:00:00 via INTRAVENOUS

## 2016-10-01 MED ORDER — FLEET ENEMA 7-19 GM/118ML RE ENEM
1.0000 | ENEMA | Freq: Once | RECTAL | Status: AC
  Administered 2016-10-01: 1 via RECTAL
  Filled 2016-10-01: qty 1

## 2016-10-01 NOTE — Care Management Note (Signed)
Case Management Note Marvetta Gibbons RN, BSN Unit 2W-Case Manager [phone]  Patient Details  Name: Michelle [name] MRN: [id number] Date of Birth: [birth year]  Subjective/Objective:    Pt admitted with CHB s/p PPM                Action/Plan: PTA pt lived at home- has caregiver, CM to follow for d/c needs  Expected Discharge Date:                  Expected Discharge Plan:  Home/Self Care  In-House Referral:     Discharge planning Services  CM Consult  Post Acute Care Choice:    Choice offered to:     DME Arranged:    DME Agency:     HH Arranged:    HH Agency:     Status of Service:  In process, will continue to follow  If discussed at Long Length of Stay Meetings, dates discussed:    Additional Comments:  Dawayne Patricia, RN 10/01/2016, 2:06 PM

## 2016-10-01 NOTE — Progress Notes (Signed)
SLP Cancellation Note  Patient Details Name: Michelle [name] MRN: [id number] DOB: 04/25/1932   Cancelled treatment:       Reason Eval/Treat Not Completed: Other (comment) (patient vomiting today and not ready for PO).  Patient declined at this time.  Plan to follow up today as time allows.  Please call with questions.  Gunnar Fusi, M.A., [name] [phone]  [place] 10/01/2016, 10:01 AM

## 2016-10-01 NOTE — Progress Notes (Signed)
Initial Nutrition Assessment  DOCUMENTATION CODES:   Not applicable  INTERVENTION:  Provide Glucerna Shake po TID, each supplement provides 220 kcal and 10 grams of protein.  Provide 30 ml Prostat po BID, each supplement provides 100 kcal and 15 grams of protein.   Encourage adequate PO intake.   NUTRITION DIAGNOSIS:   Inadequate oral intake related to nausea, vomiting, dysphagia as evidenced by meal completion < 25%.  GOAL:   Patient will meet greater than or equal to 90% of their needs  MONITOR:   PO intake, Supplement acceptance, Labs, Weight trends, Skin, I & O's  REASON FOR ASSESSMENT:   Consult Assessment of nutrition requirement/status  ASSESSMENT:   82 year old with past medical history of asthma, COPD, CAD, diabetes, Alzheimers dementia with known history of conduction system disease, sinus node disease. Presents with with nausea, vomiting, hyperglycemia. She was found to be in complete heart block in ED with a ventricular escape rythm at 20 bpm with periods of asystole in the ED. She was transferred to [facility] from [place] and temporary pacemaker placed.  Pt unavailable (bring transported for an x-ray) during time of visit. Daughter at bedside. She had requested dietitian consult to discuss nutritional supplements as pt with poor po intake and swallowing difficulties. Daughter reports caregiver at home has been chopping up/sometimes blending up foods at pt's meals. Daughter confused on which nutritional supplements to provide. RD discussed with daughter on different types of nutritional supplements and the importance of adequate protein and caloric needs as well as adequate hydration status. Discussed recommending nutritional supplements to pt especially if po intake is poor. Daughter expressed understanding.   Pt with n/v today and thus did not eat this AM. RD to order Glucerna shake and prostat to aid in adequate nutrition needs. Pt with weight loss. Per weight  records, pt with a 9.8% weight loss in 1 month.   Unable to complete Nutrition-Focused physical exam at this time.   Labs and medications reviewed. Elevated AST (360 U/L) and ALT (304 U/L).   Diet Order:  Diet Carb Modified Fluid consistency: Thin; Room service appropriate? Yes  Skin:   (Incision on chest)  Last BM:  11/18  Height:   Ht Readings from Last 1 Encounters:  09/28/16 5' (1.524 m)    Weight:   Wt Readings from Last 1 Encounters:  09/28/16 119 lb 14.9 oz (54.4 kg)    Ideal Body Weight:  45.45 kg  BMI:  Body mass index is 23.42 kg/m.  Estimated Nutritional Needs:   Kcal:  1500-1700  Protein:  65-75 grams  Fluid:  >/= 1.5 L/day  EDUCATION NEEDS:   Education needs addressed  Corrin Parker, MS, RD, LDN Pager # [phone] After hours/ weekend pager # [phone]

## 2016-10-01 NOTE — Evaluation (Signed)
Physical Therapy Evaluation Patient Details Name: Michelle [name] MRN: [id number] DOB: 1932-09-19 Today's Date: 10/01/2016   History of Present Illness  82 yo female ED with nausea, vomiting, hyperglycemia. She was found to be in complete heart block in ED with a ventricular escape rythm at 20 bpm with periods of asystole in the ED. She was transferred to [facility] from [place] and temporary pacemaker placed  Clinical Impression  Patient demonstrates deficits in functional mobility as indicated below. Will need continued skilled PT to address deficits and maximize function. Will see as indicated and progress as tolerated.   OF NOTE: Spoke with patient and daughter at length, nausea and vomiting with positional changes especiall rolling in bed, some nystagmus noted when rolling to left side.  Daughter states patient with hx of vertigo on OTC medication daily but has not had it in hospital. Nsg aware.    Follow Up Recommendations Home health PT;Supervision/Assistance - 24 hour    Equipment Recommendations  None recommended by PT    Recommendations for Other Services Other (comment) (VESTIBULAR PT)     Precautions / Restrictions Precautions Precautions: Fall Restrictions Weight Bearing Restrictions: No      Mobility  Bed Mobility Overal bed mobility: Needs Assistance Bed Mobility: Rolling;Supine to Sit;Sit to Supine Rolling: Min assist   Supine to sit: Mod assist Sit to supine: Min assist   General bed mobility comments: Moderate assist to come to EOB for elevation of trunk, min assist to return to supine, Vomitting and spinning with rolling in bed, nsg aware ? vestibular deficits  Transfers                 General transfer comment: deferred dur to vomiting  Ambulation/Gait     Assistive device: Rolling walker (2 wheeled)   Gait velocity: decr      Stairs            Wheelchair Mobility    Modified Rankin (Stroke Patients Only)       Balance      Sitting balance-Leahy Scale: Fair                                       Pertinent Vitals/Pain Pain Assessment: No/denies pain    Home Living Family/patient expects to be discharged to:: Private residence Living Arrangements: Children;Other (Comment) (caregiver ) Available Help at Discharge: Family;Personal care attendant;Available 24 hours/day Type of Home: House Home Access: Ramped entrance     Home Layout: One level Home Equipment: Walker - 2 wheels;Cane - single point;Shower seat;Grab bars - toilet;Grab bars - tub/shower;Wheelchair - manual      Prior Function Level of Independence: Needs assistance   Gait / Transfers Assistance Needed: supervision for ambulation with RW; walks up to 1 mile at park pushing w/c (with multiple seated rest breaks; last fall ~4 weeks ago in  bathroom   ADL's / Homemaking Assistance Needed: assist with bathing/dressing  Comments: Uses RW at home; does not cook or drive. Pt not a great historian. Per prior notes from 1 month ago, daughter normally stays there with her during the day but leaves once she gets her in bed and then arrives before pt gets out of bed     Hand Dominance   Dominant Hand: Right    Extremity/Trunk Assessment   Upper Extremity Assessment: Generalized weakness           Lower  Extremity Assessment: Generalized weakness      Cervical / Trunk Assessment: Kyphotic  Communication   Communication: No difficulties  Cognition Arousal/Alertness: Lethargic Behavior During Therapy: WFL for tasks assessed/performed Overall Cognitive Status: History of cognitive impairments - at baseline       Memory: Decreased short-term memory (h/o dementia)              General Comments      Exercises     Assessment/Plan    PT Assessment Patient needs continued PT services  PT Problem List Decreased strength;Decreased activity tolerance;Decreased balance;Decreased mobility;Decreased  cognition;Decreased knowledge of use of DME;Decreased safety awareness          PT Treatment Interventions DME instruction;Gait training;Functional mobility training;Therapeutic activities;Balance training;Neuromuscular re-education;Cognitive remediation;Patient/family education    PT Goals (Current goals can be found in the Care Plan section)  Acute Rehab PT Goals Patient Stated Goal: daughter wants pt to return home from hospital PT Goal Formulation: With patient/family Time For Goal Achievement: 10/15/16 Potential to Achieve Goals: Good    Frequency Min 3X/week   Barriers to discharge        Co-evaluation               End of Session Equipment Utilized During Treatment: Gait belt Activity Tolerance: Patient tolerated treatment well Patient left: in bed;with family/visitor present;Other (comment) (for transport) Nurse Communication: Mobility status;Other (comment) (hx of vertigo )         Time: [id number] PT Time Calculation (min) (ACUTE ONLY): 26 min   Charges:   PT Evaluation $PT Eval Moderate Complexity: 1 Procedure PT Treatments $Therapeutic Activity: 8-22 mins   PT G Codes:        Duncan Dull 08-Oct-2016, 5:21 PM Alben Deeds, [place] DPT  [phone]

## 2016-10-01 NOTE — Evaluation (Signed)
Clinical/Bedside Swallow Evaluation Patient Details  Name: Michelle [name] MRN: [id number] Date of Birth: Jul 31, 1932  Today's Date: 10/01/2016 Time: SLP Start Time (ACUTE ONLY): 1410 SLP Stop Time (ACUTE ONLY): 1445 SLP Time Calculation (min) (ACUTE ONLY): 35 min  Past Medical History:  Past Medical History:  Diagnosis Date  . Arthritis   . Asthma   . COPD (chronic obstructive pulmonary disease) ([place])   . Coronary atherosclerosis   . Dyslipidemia   . Gallstones   . History of fractured kneecap   . Hypothyroidism   . Interstitial cystitis   . Malaise and fatigue   . Memory loss   . Polymyalgia rheumatica ([place])   . Systemic hypertension   . Type II or unspecified type diabetes mellitus without mention of complication, not stated as uncontrolled   . UTI (lower urinary tract infection)    Past Surgical History:  Past Surgical History:  Procedure Laterality Date  . CARDIAC CATHETERIZATION  01/29/2006   10% luminal irregularity mid LAD  . CARDIAC CATHETERIZATION  03/15/2001   No significant CAD, no evidence of renal artery stenosis, systemic hypertenion  . CARDIAC CATHETERIZATION N/A 08/21/2016   Procedure: Temporary Pacemaker;  Surgeon: Jettie Booze, MD;  Location: [place] [facility];  Service: Cardiovascular;  Laterality: N/A;  . CARDIAC CATHETERIZATION N/A 09/28/2016   Procedure: Temporary Pacemaker;  Surgeon: Burnell Blanks, MD;  Location: [place] [facility];  Service: Cardiovascular;  Laterality: N/A;  . CERVICAL SPINE SURGERY    . CHOLECYSTECTOMY    . EP IMPLANTABLE DEVICE N/A 09/29/2016   Procedure: Pacemaker Implant;  Surgeon: Evans Lance, MD;  Location: [place] [facility];  Service: Cardiovascular;  Laterality: N/A;  . HERNIA REPAIR    . LUMBAR DISC SURGERY    . NM MYOCAR PERF WALL MOTION  08/17/2009   normal  . ROTATOR CUFF REPAIR    . TONSILLECTOMY    . TOTAL ABDOMINAL HYSTERECTOMY    . US ECHOCARDIOGRAPHY  09/14/2007   mild LVH, LA mildly  dilated,mild mitral annular calcification, AOV mildly sclerotic, aortic root sclerosis/ca+   HPI:  82 year old with past medical history of asthma, COPD, CAD, diabetes, Alzheimers dementia with known history of conduction system disease, sinus node disease. She has declined pacemaker in the past. She was admitted and November with hyperglycemia, transient complete heart block which was started on epinephrine infusion with resolution of heart block. She returns to the ED with nausea, vomiting, hyperglycemia. She was found to be in complete heart block in ED with a ventricular escape rythm at 20 bpm with periods of asystole in the ED. She was transferred to [facility] from [place] and temporary pacemaker placed. PCCM consulted for admission.   Assessment / Plan / Recommendation Clinical Impression  Patient presents with an unremarkable oral motor exam.  Oral holding and prolonged oral transit of bolus observed, likely due to dementia dx however presentation of next bite or sip appeared to trigger swallow.  Cough noted following large straw sips.  Trials were limited due to patient fatigue.  Per family report they were pureeing food prior to this admission.  As a result, recommend diet down grade to Dys.1 textures with continuation of thin liquids with full supervision for small portions and a slow rate with oral clearance prior to next bite or sip.  Treatment initiated with focus on education with family regarding the importance of patient self-feeding when awake and alert to reduce aspiration risk.  Also recommend to stop eating  and drinking if coughing.  RN also educated.  Patient requires acute follow up to ensure toleration.  Post-acute recommendations TBD.     Aspiration Risk  Risk for inadequate nutrition/hydration;Moderate aspiration risk    Diet Recommendation Dysphagia 1 (Puree);Thin liquid   Liquid Administration via: Cup;Straw Medication Administration: Whole meds with puree Supervision:  Patient able to self feed;Full supervision/cueing for compensatory strategies;Staff to assist with self feeding Compensations: Slow rate;Small sips/bites;Minimize environmental distractions Postural Changes: Seated upright at 90 degrees    Other  Recommendations Oral Care Recommendations: Oral care BID   Follow up Recommendations 24 hour supervision/assistance;Other (comment) (TBD)      Frequency and Duration min 2x/week  1 week       Prognosis Prognosis for Safe Diet Advancement: Fair Barriers to Reach Goals: Cognitive deficits      Swallow Study   General HPI: 82 year old with past medical history of asthma, COPD, CAD, diabetes, Alzheimers dementia with known history of conduction system disease, sinus node disease. She has declined pacemaker in the past. She was admitted and November with hyperglycemia, transient complete heart block which was started on epinephrine infusion with resolution of heart block. She returns to the ED with nausea, vomiting, hyperglycemia. She was found to be in complete heart block in ED with a ventricular escape rythm at 20 bpm with periods of asystole in the ED. She was transferred to [facility] from [place] and temporary pacemaker placed. PCCM consulted for admission. Type of Study: Bedside Swallow Evaluation Previous Swallow Assessment: BSE 1 month ago  Diet Prior to this Study: Regular;Thin liquids Temperature Spikes Noted: No Respiratory Status: Room air History of Recent Intubation: No Behavior/Cognition: Pleasant mood;Lethargic/Drowsy;Requires cueing Oral Cavity Assessment: Within Functional Limits Oral Care Completed by SLP: No Oral Cavity - Dentition: Adequate natural dentition Vision: Functional for self-feeding Self-Feeding Abilities: Able to feed self Patient Positioning: Upright in bed Baseline Vocal Quality: Low vocal intensity Volitional Cough: Cognitively unable to elicit Volitional Swallow: Able to elicit    Oral/Motor/Sensory  Function Overall Oral Motor/Sensory Function: Within functional limits   Ice Chips Ice chips: Within functional limits   Thin Liquid Thin Liquid: Impaired Presentation: Self Fed;Straw Pharyngeal  Phase Impairments: Cough - Immediate Other Comments: cues for small sips prevented further overt s/s of aspiration     Nectar Thick Nectar Thick Liquid: Not tested   Honey Thick Honey Thick Liquid: Not tested   Puree Puree: Impaired Oral Phase Functional Implications: Oral holding   Solid   GO   Solid: Not tested       Michelle [name], M.A., [name] [phone]  Nadine Ryle 10/01/2016,3:13 PM

## 2016-10-01 NOTE — Progress Notes (Signed)
PROGRESS NOTE    Michelle [name]  [id number] DOB: [birth year] DOA: 09/28/2016 PCP: [name], MD    Brief Narrative: 82 year old with past medical history of asthma, COPD, CAD, diabetes, Alzheimers dementia with known history of conduction system disease, sinus node disease. She has declined pacemaker in the past. She was admitted and November with hyperglycemia, transient complete heart block which was started on epinephrine infusion with resolution of heart block.  She returns to the ED with nausea, vomiting, hyperglycemia. She was found to be in complete heart block in ED with a ventricular escape rythm at 20 bpm with periods of asystole in the ED. She was transferred to [facility] from [place] and temporary pacemaker placed. PCCM consulted for admission.    Assessment & Plan:   Active Problems:   Complete heart block (HCC)   Cardiogenic shock (HCC)   Elevated troponin   Shock liver  Nausea, vomiting;  Check KUB. Consistent with constipation. Fleet ordered.  [place] negative.  Check lipase.   Complete heart block. S/p temporary Pacemaker PPM placed 11/20  DM;  Hold NPH , poor oral intake.  Received insulin this Am. Start D 5 IV fluds.   Transaminases; shock liver, trending down.  Unremarkable ultrasound of the right upper quadrant. Status post Cholecystectomy.  AKI in setting of cardiogenic shock, ATN Hyperkalemia Creatinine down  Improving.   Polymyagia rheumatica Hypothyroidism TSH- 3.4 Continue synthyroid pred to stress steroids  COPD;  Enlargement of cardiac silhouette with pulmonary vascular congestion. Bronchitic changes with mild RIGHT basilar atelectasis. Duonebs Continue Spiriva CXR 11/22 Stress steroids needed, was on pred 5 (for pmr) OOB as tolerated Pulmonary Toilet as able  Alzheimer dementia Hold Namenda  DVT prophylaxis:  Code Status: DNR Family Communication: daughter at bedside.  Disposition Plan: to be determine.    Consultants:   Cardiology    Procedures: US abdomen negative    Antimicrobials: \  none   Subjective: She is having nausea, and vomiting.  Denies abdominal pain.    Objective: Vitals:   09/30/16 1530 09/30/16 1559 09/30/16 2000 10/01/16 0424  BP: (!) 121/58  (!) 146/61 (!) 148/76  Pulse: 60  60 60  Resp: 18  (!) 22 18  Temp: 98.9 F (37.2 C)  98.2 F (36.8 C) 98 F (36.7 C)  TempSrc: Oral  Oral Oral  SpO2: 98% 94% 98% 100%  Weight:      Height:        Intake/Output Summary (Last 24 hours) at 10/01/16 1001 Last data filed at 09/30/16 1500  Gross per 24 hour  Intake              170 ml  Output                0 ml  Net              170 ml   Filed Weights   09/28/16 1940  Weight: 54.4 kg (119 lb 14.9 oz)    Examination:  General exam: Appears calm and comfortable  Respiratory system: Clear to auscultation. Respiratory effort normal. Cardiovascular system: S1 & S2 heard, RRR. No JVD, murmurs, rubs, gallops or clicks. No pedal edema. Gastrointestinal system: Abdomen is nondistended, soft and nontender. No organomegaly or masses felt. Normal bowel sounds heard. Central nervous system: Alert and oriented. No focal neurological deficits. Extremities: Symmetric 5 x 5 power. Skin: No rashes, lesions or ulcers Psychiatry: Judgement and insight appear normal. Mood & affect appropriate.  Data Reviewed: I have personally reviewed following labs and imaging studies  CBC:  Recent Labs Lab 09/28/16 1551 09/29/16 0211 10/01/16 0450  WBC 15.6* 16.0* 10.3  NEUTROABS 13.4*  --   --   HGB 9.4* 9.5* 9.1*  HCT 27.4* 28.0* 26.9*  MCV 92.3 91.2 90.9  PLT 354 304 [id number]   Basic Metabolic Panel:  Recent Labs Lab 09/28/16 1551 09/29/16 0211 09/29/16 1953 09/30/16 1226 10/01/16 0450  NA 131* 134* 136 128* 131*  K 5.3* 5.0 5.8* 4.3 3.9  CL 96* 102 103 99* 99*  CO2 20* 22 21* 17* 23  GLUCOSE 373* 90 179* 293* 66  BUN 43* 43* 35* 36* 26*  CREATININE 2.53*  2.31* 1.65* 1.45* 1.06*  CALCIUM 8.9 8.8* 8.9 7.9* 8.0*  MG  --  2.0  --   --   --   PHOS  --  4.4  --   --   --    GFR: Estimated Creatinine Clearance: 29.9 mL/min (by C-G formula based on SCr of 1.06 mg/dL (H)). Liver Function Tests:  Recent Labs Lab 09/28/16 1551 09/29/16 1953 10/01/16 0450  AST 1,969* 867* 360*  ALT 1,118* 991* 304*  ALKPHOS 115 128* 91  BILITOT 0.4 0.4 0.6  PROT 6.3* 6.6 5.0*  ALBUMIN 3.6 3.5 2.6*    Recent Labs Lab 09/28/16 1551  LIPASE 28   No results for input(s): AMMONIA in the last 168 hours. Coagulation Profile:  Recent Labs Lab 09/29/16 1953  INR 1.24   Cardiac Enzymes: No results for input(s): CKTOTAL, CKMB, CKMBINDEX, TROPONINI in the last 168 hours. BNP (last 3 results) No results for input(s): PROBNP in the last 8760 hours. HbA1C: No results for input(s): HGBA1C in the last 72 hours. CBG:  Recent Labs Lab 09/30/16 2033 10/01/16 0011 10/01/16 0421 10/01/16 0546 10/01/16 0750  GLUCAP 124* 91 72 78 105*   Lipid Profile: No results for input(s): CHOL, HDL, LDLCALC, TRIG, CHOLHDL, LDLDIRECT in the last 72 hours. Thyroid Function Tests:  Recent Labs  09/29/16 0211  TSH 3.413   Anemia Panel: No results for input(s): VITAMINB12, FOLATE, FERRITIN, TIBC, IRON, RETICCTPCT in the last 72 hours. Sepsis Labs:  Recent Labs Lab 09/29/16 1953 09/30/16 0235 10/01/16 0450  PROCALCITON 4.67 4.51 2.30    Recent Results (from the past 240 hour(s))  Urine culture     Status: None   Collection Time: 09/22/16  3:19 PM  Result Value Ref Range Status   Organism ID, Bacteria NO GROWTH  Final  MRSA PCR Screening     Status: None   Collection Time: 09/28/16  8:27 PM  Result Value Ref Range Status   MRSA by PCR NEGATIVE NEGATIVE Final    Comment:        The GeneXpert MRSA Assay (FDA approved for NASAL specimens only), is one component of a comprehensive MRSA colonization surveillance program. It is not intended to diagnose  MRSA infection nor to guide or monitor treatment for MRSA infections.   Surgical pcr screen     Status: None   Collection Time: 09/29/16  5:56 AM  Result Value Ref Range Status   MRSA, PCR NEGATIVE NEGATIVE Final   Staphylococcus aureus NEGATIVE NEGATIVE Final    Comment:        The Xpert SA Assay (FDA approved for NASAL specimens in patients over 57 years of age), is one component of a comprehensive surveillance program.  Test performance has been validated by [facility] for patients greater than or  equal to 78 year old. It is not intended to diagnose infection nor to guide or monitor treatment.          Radiology Studies: Dg Chest Port 1 View  Result Date: 10/01/2016 CLINICAL DATA:  Followup respiratory failure EXAM: PORTABLE CHEST 1 VIEW COMPARISON:  Portable chest x-ray of 09/30/2016 FINDINGS: No active infiltrate or effusion is seen. Mild pulmonary vascular congestion has improved. Cardiomegaly is stable. Newly permanent pacemaker remains. A lower anterior cervical spine fusion plate is present. IMPRESSION: Improved aeration with resolution of pulmonary vascular congestion. Stable cardiomegaly with permanent pacemaker. Electronically Signed   By: Ivar Drape M.D.   On: 10/01/2016 08:05   Dg Chest Port 1 View  Result Date: 09/30/2016 CLINICAL DATA:  Bradycardia, history asthma, COPD, type II diabetes mellitus, hypertension, coronary artery disease EXAM: PORTABLE CHEST 1 VIEW COMPARISON:  Portable exam 0556 hours compared to 09/29/2016 FINDINGS: LEFT subclavian transvenous pacemaker leads project over RIGHT atrium and RIGHT ventricle. Enlargement of cardiac silhouette. Calcified tortuous thoracic aorta. Mild pulmonary vascular congestion. Bronchitic changes with minimal RIGHT basilar atelectasis. Lungs otherwise clear without infiltrate, pleural effusion, or pneumothorax. Osseous demineralization. Prior cervicothoracic fusion. IMPRESSION: Enlargement of cardiac silhouette  with pulmonary vascular congestion. Bronchitic changes with mild RIGHT basilar atelectasis. Electronically Signed   By: Lavonia Dana M.D.   On: 09/30/2016 07:54   Dg Chest Port 1 View  Result Date: 09/29/2016 CLINICAL DATA:  Postoperative radiograph, status post pacemaker placement. Initial encounter. EXAM: PORTABLE CHEST 1 VIEW COMPARISON:  Chest radiograph performed earlier today at 3:59 a.m. FINDINGS: The lungs are well-aerated and clear. There is no evidence of focal opacification, pleural effusion or pneumothorax. The cardiomediastinal silhouette is mildly enlarged. A pacemaker is noted overlying the left chest wall, with leads ending overlying the right atrium and right ventricle. No acute osseous abnormalities are seen. Clips are noted within the right upper quadrant, reflecting prior cholecystectomy. Cervical spinal fusion hardware is partially imaged. IMPRESSION: Mild cardiomegaly.  Lungs remain grossly clear. Electronically Signed   By: Garald Balding M.D.   On: 09/29/2016 20:34   US Abdomen Limited Ruq  Result Date: 09/29/2016 CLINICAL DATA:  Acute onset of elevated LFTs. Nausea and vomiting. Initial encounter. EXAM: US ABDOMEN LIMITED - RIGHT UPPER QUADRANT COMPARISON:  None. FINDINGS: Gallbladder: Status post cholecystectomy.  No retained stones seen. Common bile duct: Diameter: 0.5 cm, within normal limits in caliber. Liver: No focal lesion identified. Within normal limits in parenchymal echogenicity. IMPRESSION: Unremarkable ultrasound of the right upper quadrant. Status post cholecystectomy. Electronically Signed   By: Garald Balding M.D.   On: 09/29/2016 21:44        Scheduled Meds: . acetylcysteine  4 mL Nebulization BID  . aspirin EC  81 mg Oral Daily  . carvedilol  6.25 mg Oral BID WC  . carvedilol  6.25 mg Oral Once  . ferrous sulfate  325 mg Oral Q breakfast  . hydrocortisone sodium succinate  25 mg Intravenous Daily  . insulin aspart  0-15 Units Subcutaneous Q4H  .  insulin NPH Human  20 Units Subcutaneous QAC breakfast   And  . insulin NPH Human  5 Units Subcutaneous QHS  . levothyroxine  100 mcg Oral QAC breakfast  . mouth rinse  15 mL Mouth Rinse BID  . sodium chloride flush  3 mL Intravenous Q12H  . tiotropium  18 mcg Inhalation Daily   Continuous Infusions: . sodium chloride 20 mL/hr at 10/01/16 0510     LOS: 3 days  Time spent: 25 minutes.     Elmarie Shiley, MD [facility] Pager [phone]  If 7PM-7AM, please contact night-coverage [email or web address] Password [email or web address] 10/01/2016, 10:01 AM

## 2016-10-02 LAB — GLUCOSE, CAPILLARY
GLUCOSE-CAPILLARY: 204 mg/dL — AB (ref 65–99)
GLUCOSE-CAPILLARY: 171 mg/dL — AB (ref 65–99)
Glucose-Capillary: 148 mg/dL — ABNORMAL HIGH (ref 65–99)
Glucose-Capillary: 252 mg/dL — ABNORMAL HIGH (ref 65–99)
Glucose-Capillary: 284 mg/dL — ABNORMAL HIGH (ref 65–99)
Glucose-Capillary: 89 mg/dL (ref 65–99)
Glucose-Capillary: 94 mg/dL (ref 65–99)

## 2016-10-02 LAB — BASIC METABOLIC PANEL
Anion gap: 8 (ref 5–15)
BUN: 21 mg/dL — AB (ref 6–20)
CALCIUM: 7.6 mg/dL — AB (ref 8.9–10.3)
CHLORIDE: 100 mmol/L — AB (ref 101–111)
CO2: 22 mmol/L (ref 22–32)
CREATININE: 0.93 mg/dL (ref 0.44–1.00)
GFR calc Af Amer: >60 mL/min (ref >60)
GFR calc non Af Amer: 56 mL/min — ABNORMAL LOW (ref >60)
GLUCOSE: 68 mg/dL (ref 65–99)
Potassium: 3.3 mmol/L — ABNORMAL LOW (ref 3.5–5.1)
Sodium: 130 mmol/L — ABNORMAL LOW (ref 135–145)

## 2016-10-02 MED ORDER — BISACODYL 10 MG RE SUPP
10.0000 mg | Freq: Once | RECTAL | Status: AC
  Administered 2016-10-02: 10 mg via RECTAL
  Filled 2016-10-02: qty 1

## 2016-10-02 MED ORDER — POTASSIUM CHLORIDE CRYS ER 20 MEQ PO TBCR
40.0000 meq | EXTENDED_RELEASE_TABLET | Freq: Once | ORAL | Status: AC
  Administered 2016-10-02: 40 meq via ORAL
  Filled 2016-10-02: qty 2

## 2016-10-02 MED ORDER — LACTULOSE 10 GM/15ML PO SOLN
30.0000 g | Freq: Two times a day (BID) | ORAL | Status: DC
  Administered 2016-10-02 (×2): 30 g via ORAL
  Filled 2016-10-02 (×3): qty 45

## 2016-10-02 MED ORDER — AMLODIPINE BESYLATE 5 MG PO TABS
2.5000 mg | ORAL_TABLET | Freq: Every day | ORAL | Status: DC
  Administered 2016-10-02 – 2016-10-04 (×3): 2.5 mg via ORAL
  Filled 2016-10-02 (×3): qty 1

## 2016-10-02 MED ORDER — SENNOSIDES-DOCUSATE SODIUM 8.6-50 MG PO TABS
1.0000 | ORAL_TABLET | Freq: Two times a day (BID) | ORAL | Status: DC
  Administered 2016-10-02 – 2016-10-03 (×3): 1 via ORAL
  Filled 2016-10-02 (×4): qty 1

## 2016-10-02 NOTE — Progress Notes (Signed)
PROGRESS NOTE    Michelle [name]  [id number] DOB: 01/29/1932 DOA: 09/28/2016 PCP: [name], MD    Brief Narrative: 82 year old with past medical history of asthma, COPD, CAD, diabetes, Alzheimers dementia with known history of conduction system disease, sinus node disease. She has declined pacemaker in the past. She was admitted and November with hyperglycemia, transient complete heart block which was started on epinephrine infusion with resolution of heart block.  She returns to the ED with nausea, vomiting, hyperglycemia. She was found to be in complete heart block in ED with a ventricular escape rythm at 20 bpm with periods of asystole in the ED. She was transferred to [facility] from [place] and temporary pacemaker placed. PCCM consulted for admission.    Assessment & Plan:   Active Problems:   Complete heart block (HCC)   Cardiogenic shock (HCC)   Elevated troponin   Shock liver  Nausea, vomiting;  Check KUB. Consistent with constipation. Fleet ordered.  [place] negative.  Lipase norma. Improved. Will order lactulose, senna. .   Complete heart block. S/p temporary Pacemaker PPM placed 11/20  HTN; will hold Micardis due to AKI.  Start low dose norvasc.   DM;  Hold NPH , poor oral intake.  Stop D 5.  Will use SSI.   Transaminases; shock liver, trending down.  Unremarkable ultrasound of the right upper quadrant. Status post Cholecystectomy.  AKI in setting of cardiogenic shock, ATN Hyperkalemia Creatinine down  Improving.   Polymyagia rheumatica Hypothyroidism TSH- 3.4 Continue synthyroid pred to stress steroids  COPD;  Enlargement of cardiac silhouette with pulmonary vascular congestion. Bronchitic changes with mild RIGHT basilar atelectasis. Duonebs Continue Spiriva CXR 11/22 Stress steroids needed, was on pred 5 (for pmr) OOB as tolerated Pulmonary Toilet as able  Alzheimer dementia Hold Namenda  DVT prophylaxis:  Code Status:  DNR Family Communication: daughter at bedside.  Disposition Plan: to be determine.   Consultants:   Cardiology    Procedures: US abdomen negative    Antimicrobials: \  none   Subjective: She is feeling better, nausea improved.  No BM yet.    Objective: Vitals:   10/01/16 1831 10/01/16 2100 10/02/16 0420 10/02/16 0858  BP: (!) 170/65 (!) 159/69 (!) 158/72 (!) 157/72  Pulse:  72 60 61  Resp:  20 18   Temp:  98.3 F (36.8 C) 98.1 F (36.7 C)   TempSrc:  Oral Oral   SpO2:  100% 100%   Weight:      Height:        Intake/Output Summary (Last 24 hours) at 10/02/16 1252 Last data filed at 10/02/16 0900  Gross per 24 hour  Intake              985 ml  Output                0 ml  Net              985 ml   Filed Weights   09/28/16 1940  Weight: 54.4 kg (119 lb 14.9 oz)    Examination:  General exam: Appears calm and comfortable  Respiratory system: Clear to auscultation. Respiratory effort normal. Cardiovascular system: S1 & S2 heard, RRR. No JVD, murmurs, rubs, gallops or clicks. No pedal edema. Gastrointestinal system: Abdomen is nondistended, soft and nontender. No organomegaly or masses felt. Normal bowel sounds heard. Central nervous system: Alert and oriented. No focal neurological deficits. Extremities: Symmetric 5 x 5 power. Skin: No rashes, lesions or  ulcers Psychiatry: Judgement and insight appear normal. Mood & affect appropriate.     Data Reviewed: I have personally reviewed following labs and imaging studies  CBC:  Recent Labs Lab 09/28/16 1551 09/29/16 0211 10/01/16 0450  WBC 15.6* 16.0* 10.3  NEUTROABS 13.4*  --   --   HGB 9.4* 9.5* 9.1*  HCT 27.4* 28.0* 26.9*  MCV 92.3 91.2 90.9  PLT 354 304 [id number]   Basic Metabolic Panel:  Recent Labs Lab 09/29/16 0211 09/29/16 1953 09/30/16 1226 10/01/16 0450 10/02/16 0258  NA 134* 136 128* 131* 130*  K 5.0 5.8* 4.3 3.9 3.3*  CL 102 103 99* 99* 100*  CO2 22 21* 17* 23 22  GLUCOSE 90 179*  293* 66 68  BUN 43* 35* 36* 26* 21*  CREATININE 2.31* 1.65* 1.45* 1.06* 0.93  CALCIUM 8.8* 8.9 7.9* 8.0* 7.6*  MG 2.0  --   --   --   --   PHOS 4.4  --   --   --   --    GFR: Estimated Creatinine Clearance: 34.1 mL/min (by C-G formula based on SCr of 0.93 mg/dL). Liver Function Tests:  Recent Labs Lab 09/28/16 1551 09/29/16 1953 10/01/16 0450  AST 1,969* 867* 360*  ALT 1,118* 991* 304*  ALKPHOS 115 128* 91  BILITOT 0.4 0.4 0.6  PROT 6.3* 6.6 5.0*  ALBUMIN 3.6 3.5 2.6*    Recent Labs Lab 09/28/16 1551 10/01/16 1241  LIPASE 28 30   No results for input(s): AMMONIA in the last 168 hours. Coagulation Profile:  Recent Labs Lab 09/29/16 1953  INR 1.24   Cardiac Enzymes: No results for input(s): CKTOTAL, CKMB, CKMBINDEX, TROPONINI in the last 168 hours. BNP (last 3 results) No results for input(s): PROBNP in the last 8760 hours. HbA1C: No results for input(s): HGBA1C in the last 72 hours. CBG:  Recent Labs Lab 10/01/16 2115 10/02/16 0019 10/02/16 0418 10/02/16 0820 10/02/16 1124  GLUCAP 165* 94 89 148* 204*   Lipid Profile: No results for input(s): CHOL, HDL, LDLCALC, TRIG, CHOLHDL, LDLDIRECT in the last 72 hours. Thyroid Function Tests: No results for input(s): TSH, T4TOTAL, FREET4, T3FREE, THYROIDAB in the last 72 hours. Anemia Panel: No results for input(s): VITAMINB12, FOLATE, FERRITIN, TIBC, IRON, RETICCTPCT in the last 72 hours. Sepsis Labs:  Recent Labs Lab 09/29/16 1953 09/30/16 0235 10/01/16 0450  PROCALCITON 4.67 4.51 2.30    Recent Results (from the past 240 hour(s))  Urine culture     Status: None   Collection Time: 09/22/16  3:19 PM  Result Value Ref Range Status   Organism ID, Bacteria NO GROWTH  Final  MRSA PCR Screening     Status: None   Collection Time: 09/28/16  8:27 PM  Result Value Ref Range Status   MRSA by PCR NEGATIVE NEGATIVE Final    Comment:        The GeneXpert MRSA Assay (FDA approved for NASAL specimens only),  is one component of a comprehensive MRSA colonization surveillance program. It is not intended to diagnose MRSA infection nor to guide or monitor treatment for MRSA infections.   Surgical pcr screen     Status: None   Collection Time: 09/29/16  5:56 AM  Result Value Ref Range Status   MRSA, PCR NEGATIVE NEGATIVE Final   Staphylococcus aureus NEGATIVE NEGATIVE Final    Comment:        The Xpert SA Assay (FDA approved for NASAL specimens in patients over 33 years of age), is one  component of a comprehensive surveillance program.  Test performance has been validated by [facility] for patients greater than or equal to 63 year old. It is not intended to diagnose infection nor to guide or monitor treatment.          Radiology Studies: Dg Abd 1 View  Result Date: 10/01/2016 CLINICAL DATA:  Constipation and diarrhea. EXAM: ABDOMEN - 1 VIEW COMPARISON:  10/26/2015. FINDINGS: Cardiac pacer with lead tips projected the right atrium and right ventricle. Surgical clips right upper quadrant. Prominent amount stool noted throughout the colon. No bowel distention. No free air. Lumbar spine scoliosis and degenerative change. Degenerative changes both hips. Visceral atherosclerotic vascular calcification. IMPRESSION: 1. Prominent amount of stool noted throughout colon suggesting constipation. No prominent bowel distention. 2. Scoliosis and degenerative change lumbar spine. Degenerative changes both hips. 3. Atherosclerotic vascular disease. Electronically Signed   By: Marcello Moores  Register   On: 10/01/2016 12:21   Dg Chest Port 1 View  Result Date: 10/01/2016 CLINICAL DATA:  Followup respiratory failure EXAM: PORTABLE CHEST 1 VIEW COMPARISON:  Portable chest x-ray of 09/30/2016 FINDINGS: No active infiltrate or effusion is seen. Mild pulmonary vascular congestion has improved. Cardiomegaly is stable. Newly permanent pacemaker remains. A lower anterior cervical spine fusion plate is present.  IMPRESSION: Improved aeration with resolution of pulmonary vascular congestion. Stable cardiomegaly with permanent pacemaker. Electronically Signed   By: Ivar Drape M.D.   On: 10/01/2016 08:05        Scheduled Meds: . amLODipine  2.5 mg Oral Daily  . aspirin EC  81 mg Oral Daily  . carvedilol  6.25 mg Oral BID WC  . carvedilol  6.25 mg Oral Once  . famotidine  20 mg Oral Daily  . feeding supplement (GLUCERNA SHAKE)  237 mL Oral TID BM  . feeding supplement (PRO-STAT SUGAR FREE 64)  30 mL Oral BID  . fentaNYL  25 mcg Transdermal Q72H  . ferrous sulfate  325 mg Oral Q breakfast  . hydrocortisone sodium succinate  25 mg Intravenous Daily  . insulin aspart  0-15 Units Subcutaneous Q4H  . lactulose  30 g Oral BID  . levothyroxine  100 mcg Oral QAC breakfast  . mouth rinse  15 mL Mouth Rinse BID  . senna-docusate  1 tablet Oral BID  . sodium chloride flush  3 mL Intravenous Q12H  . tiotropium  18 mcg Inhalation Daily   Continuous Infusions: . sodium chloride 20 mL/hr at 10/01/16 0510     LOS: 4 days    Time spent: 25 minutes.     Elmarie Shiley, MD [facility] Pager [phone]  If 7PM-7AM, please contact night-coverage [email or web address] Password [email or web address] 10/02/2016, 12:52 PM

## 2016-10-02 NOTE — Progress Notes (Signed)
Speech Language Pathology Treatment: Dysphagia  Patient Details Name: Michelle [name] MRN: [id number] DOB: 03/20/1932 Today's Date: 10/02/2016 Time: [id number] SLP Time Calculation (min) (ACUTE ONLY): 26 min  Assessment / Plan / Recommendation Clinical Impression  Pt intake is poor and she repeated "I just didn't want anything" over and over during session.  RN provided pt with po medications = first pill swallowed with water with delay. RN then gave pt 3 pills with pudding and pt followed with liquids = but she did not swallow after several minutes requiring cues to eventually expectorate.  Recommend crushed medications or liquid form.    Pt given a container of peaches = and SLP observed her eat all of them with adequate mastication and swallow.  Cough x2 during session = one with excessive oral holding of liquids- likely premature spillage of boluses into pharynx/larynx.  Suspect pharyngeal swallow is intact and oral deficits predominant.     Will advance to dys2/thin diet to maximize intake as safely as possible.  Posted swallow precaution sign above the bed for nursing/family.  Will follow up one more time as concerns for adequate nutrition/hydration present given pt's poor intake.     HPI HPI: 82 year old with past medical history of asthma, COPD, CAD, diabetes, Alzheimers dementia with known history of conduction system disease, sinus node disease. She has declined pacemaker in the past. She was admitted and November with hyperglycemia, transient complete heart block which was started on epinephrine infusion with resolution of heart block. She returns to the ED with nausea, vomiting, hyperglycemia. She was found to be in complete heart block in ED with a ventricular escape rythm at 20 bpm with periods of asystole in the ED. She was transferred to [facility] from [place] and temporary pacemaker placed. PCCM consulted for admission.      SLP Plan  Continue with current plan of care      Recommendations  Diet recommendations: Dysphagia 2 (fine chop);Thin liquid Liquids provided via: Straw Medication Administration: Crushed with puree Supervision: Patient able to self feed;Intermittent supervision to cue for compensatory strategies Compensations: Slow rate;Small sips/bites;Minimize environmental distractions Postural Changes and/or Swallow Maneuvers: Seated upright 90 degrees;Upright 30-60 min after meal                Oral Care Recommendations: Oral care BID Follow up Recommendations: 24 hour supervision/assistance;Other (comment) (TBD) Plan: Continue with current plan of care       [place], [place], [place] [facility] SLP [phone]

## 2016-10-02 NOTE — Progress Notes (Signed)
Spoke with Regalado MD. Pt. has just finished taking all of lactulose. If patient has not had a BM give suppository at 2200.

## 2016-10-03 LAB — GLUCOSE, CAPILLARY
GLUCOSE-CAPILLARY: 260 mg/dL — AB (ref 65–99)
GLUCOSE-CAPILLARY: 316 mg/dL — AB (ref 65–99)
Glucose-Capillary: 66 mg/dL (ref 65–99)
Glucose-Capillary: 80 mg/dL (ref 65–99)
Glucose-Capillary: 101 mg/dL — ABNORMAL HIGH (ref 65–99)
Glucose-Capillary: 321 mg/dL — ABNORMAL HIGH (ref 65–99)

## 2016-10-03 LAB — BASIC METABOLIC PANEL
Anion gap: 9 (ref 5–15)
BUN: 20 mg/dL (ref 6–20)
CALCIUM: 8.3 mg/dL — AB (ref 8.9–10.3)
CO2: 23 mmol/L (ref 22–32)
CREATININE: 0.83 mg/dL (ref 0.44–1.00)
Chloride: 101 mmol/L (ref 101–111)
Glucose, Bld: 71 mg/dL (ref 65–99)
Potassium: 3.7 mmol/L (ref 3.5–5.1)
SODIUM: 133 mmol/L — AB (ref 135–145)

## 2016-10-03 MED ORDER — TWOCAL HN PO LIQD
237.0000 mL | Freq: Two times a day (BID) | ORAL | Status: DC
  Administered 2016-10-03 – 2016-10-04 (×2): 237 mL via ORAL
  Filled 2016-10-03 (×5): qty 237

## 2016-10-03 MED ORDER — LACTULOSE 10 GM/15ML PO SOLN: 30.0000 g | Freq: Every day | ORAL | 0 refills | Status: AC | PRN

## 2016-10-03 MED ORDER — PREDNISONE 5 MG PO TABS
5.0000 mg | ORAL_TABLET | Freq: Every day | ORAL | Status: DC
  Administered 2016-10-04: 5 mg via ORAL
  Filled 2016-10-03: qty 1

## 2016-10-03 MED ORDER — GLUCERNA PO LIQD: 237.0000 mL | Freq: Two times a day (BID) | ORAL | 0 refills | Status: DC

## 2016-10-03 MED ORDER — CARVEDILOL 6.25 MG PO TABS: 6.2500 mg | ORAL_TABLET | Freq: Two times a day (BID) | ORAL | 0 refills | Status: DC

## 2016-10-03 MED ORDER — MECLIZINE HCL 12.5 MG PO TABS: 12.5000 mg | ORAL_TABLET | Freq: Every day | ORAL | 0 refills | Status: DC | PRN

## 2016-10-03 MED ORDER — ENSURE ENLIVE PO LIQD
237.0000 mL | Freq: Three times a day (TID) | ORAL | Status: DC
  Administered 2016-10-03: 237 mL via ORAL

## 2016-10-03 MED ORDER — AMLODIPINE BESYLATE 2.5 MG PO TABS: 2.5000 mg | ORAL_TABLET | Freq: Every day | ORAL | 0 refills | Status: AC

## 2016-10-03 MED ORDER — PRO-STAT SUGAR FREE PO LIQD: 30.0000 mL | Freq: Two times a day (BID) | ORAL | 0 refills | Status: DC

## 2016-10-03 MED ORDER — MECLIZINE HCL 12.5 MG PO TABS
12.5000 mg | ORAL_TABLET | Freq: Every day | ORAL | Status: DC
  Administered 2016-10-04: 12.5 mg via ORAL
  Filled 2016-10-03: qty 1

## 2016-10-03 NOTE — Discharge Summary (Signed)
Physician Discharge Summary  Michelle [name] [id number] DOB: 1932/01/26 DOA: 09/28/2016  PCP: [name], MD  Admit date: 09/28/2016 Discharge date: 10/03/2016  Admitted From: Home  Disposition:  Home   Recommendations for Outpatient Follow-up:  1. Follow up with PCP in 1-2 weeks 2. Please obtain BMP/CBC in one week 3. Needs LFT in 1 week.   Home Health: yes.  Discharge Condition:Stable.  CODE STATUS: DNR Diet recommendation: Heart Healthy  Brief/Interim Summary: 82 year old with past medical history of asthma, COPD, CAD, diabetes, Alzheimers dementia with known history of conduction system disease, sinus node disease. She has declined pacemaker in the past. She was admitted and November with hyperglycemia, transient complete heart block which was started on epinephrine infusion with resolution of heart block.  She returns to the ED with nausea, vomiting, hyperglycemia. She was found to be in complete heart block in ED with a ventricular escape rythm at 20 bpm with periods of asystole in the ED. She was transferred to [facility] from [place] and temporary pacemaker placed. PCCM consulted for admission.    Assessment & Plan;   Nausea, vomiting; resolved Check KUB. Consistent with constipation. Fleet ordered.  [place] negative.  Lipase normal. Had 2 BM  home today if tolerates diet    Complete heart block. S/p temporary Pacemaker PPM placed 11/20  HTN; will hold Micardis due to AKI.  Started low dose norvasc.   DM;  Hold NPH , poor oral intake.  PRN SSI  Transaminases; shock liver, trending down.  Unremarkable ultrasound of the right upper quadrant. Status post Cholecystectomy. needs repeat labs in 1 week.   AKI in setting of cardiogenic shock, ATN Hyperkalemia Creatinine down  Improving.   Polymyagia rheumatica Hypothyroidism TSH- 3.4 Continue synthyroid Resume chronic dose of prednisone.   COPD;  Enlargement of cardiac silhouette with  pulmonary vascular congestion. Bronchitic changes with mild RIGHT basilar atelectasis. Duonebs Continue Spiriva Resume home dose prednisone.  OOB as tolerated Pulmonary Toilet as able  Alzheimer dementia   Discharge Diagnoses:  Active Problems:   Complete heart block (HCC)   Cardiogenic shock (HCC)   Elevated troponin   Shock liver    Discharge Instructions  Discharge Instructions    Diet - low sodium heart healthy    Complete by:  As directed    Increase activity slowly    Complete by:  As directed        Medication List    STOP taking these medications   cefixime 100 MG/5ML suspension Commonly known as:  SUPRAX   cefUROXime 250 MG tablet Commonly known as:  CEFTIN   ciprofloxacin 250 MG tablet Commonly known as:  CIPRO   insulin NPH Human 100 UNIT/ML injection Commonly known as:  HUMULIN N,NOVOLIN N   telmisartan 40 MG tablet Commonly known as:  MICARDIS     TAKE these medications   acetaminophen 325 MG tablet Commonly known as:  TYLENOL Take 2 tablets (650 mg total) by mouth every 6 (six) hours as needed for mild pain (or Fever >/= 101).   albuterol 108 (90 Base) MCG/ACT inhaler Commonly known as:  PROVENTIL HFA;VENTOLIN HFA Inhale 1-2 puffs into the lungs every 4 (four) hours as needed for wheezing or shortness of breath.   amLODipine 2.5 MG tablet Commonly known as:  NORVASC Take 1 tablet (2.5 mg total) by mouth daily. Start taking on:  10/04/2016   aspirin 81 MG tablet Take 81 mg by mouth daily.   CALCIUM 1200 PO Take 1 tablet  by mouth daily.   carvedilol 6.25 MG tablet Commonly known as:  COREG Take 1 tablet (6.25 mg total) by mouth 2 (two) times daily with a meal.   cyanocobalamin 100 MCG tablet Take 1 tablet (100 mcg total) by mouth daily.   diphenhydramine-acetaminophen 25-500 MG Tabs tablet Commonly known as:  TYLENOL PM Take 2 tablets by mouth at bedtime.   feeding supplement (PRO-STAT SUGAR FREE 64) Liqd Take 30 mLs by  mouth 2 (two) times daily.   fentaNYL 25 MCG/HR patch Commonly known as:  DURAGESIC - dosed mcg/hr Place 25 mcg onto the skin every 3 (three) days.   ferrous sulfate 325 (65 FE) MG tablet Take 325 mg by mouth daily with breakfast.   GLUCERNA Liqd Take 237 mLs by mouth 2 (two) times daily between meals. What changed:  when to take this   HYDROcodone-acetaminophen 5-325 MG tablet Commonly known as:  NORCO/VICODIN Take 1 tablet by mouth every 6 (six) hours as needed for moderate pain.   insulin lispro 100 UNIT/ML injection Commonly known as:  HUMALOG Inject 0-0.06 mLs (0-6 Units total) into the skin 3 (three) times daily as needed for high blood sugar. Patient uses sliding scale   Insulin Syringe-Needle U-100 31G X 5/16" 1 ML Misc For injection of insulin QID   lactulose 10 GM/15ML solution Commonly known as:  CHRONULAC Take 45 mLs (30 g total) by mouth daily as needed for mild constipation.   levothyroxine 100 MCG tablet Commonly known as:  SYNTHROID, LEVOTHROID TAKE 1 TABLET (100 MCG TOTAL) BY MOUTH DAILY.   meclizine 12.5 MG tablet Commonly known as:  ANTIVERT Take 1 tablet (12.5 mg total) by mouth daily as needed for dizziness (vertigo).   OCUVITE PRESERVISION PO Take 1 capsule by mouth daily.   ONE TOUCH ULTRA TEST test strip Generic drug:  glucose blood TEST BS 5-6 TIMES A DAY   predniSONE 5 MG tablet Commonly known as:  DELTASONE Take 5 mg by mouth daily.   psyllium 95 % Pack Commonly known as:  HYDROCIL/METAMUCIL Take 1 packet by mouth 3 (three) times daily.   tamsulosin 0.4 MG Caps capsule Commonly known as:  FLOMAX Take 1 capsule (0.4 mg total) by mouth daily after breakfast.   tiotropium 18 MCG inhalation capsule Commonly known as:  SPIRIVA HANDIHALER Place 1 capsule (18 mcg total) into inhaler and inhale daily.   traMADol 50 MG tablet Commonly known as:  ULTRAM Take 50 mg by mouth daily as needed for moderate pain.   UNABLE TO FIND Take 1  tablet by mouth daily. Med Name: probiotic capsule   Takes one daily.   Vitamin D3 2000 units Tabs Take 2,000 Units by mouth daily.      Follow-up Information    Cristopher Peru, MD Follow up on 10/15/2016.   Specialty:  Cardiology Why:  11:00AM, wound check visit with nurse only Contact information: [address] [address] [phone]        Cristopher Peru, MD Follow up on 01/09/2017.   Specialty:  Cardiology Why:  10:15AM Contact information: [place] [address] [phone]          No Known Allergies  Consultations:  Cardiology    Procedures/Studies: Dg Chest 1 View  Result Date: 09/25/2016 CLINICAL DATA:  Patient has been sick for 1 week with cough. EXAM: CHEST 1 VIEW COMPARISON:  08/23/2016 FINDINGS: The heart is top-normal in size. There is aortic atherosclerosis with mitral annular calcifications  as well. Mild diffuse interstitial prominence is noted which may reflect bronchitic change. No pneumonic consolidations are noted. No effusion or pneumothorax. The patient is status post ACDF of the cervical spine. IMPRESSION: Mild diffuse interstitial prominence which may reflect bronchitic change. No pneumonic consolidation. No effusion. Aortic atherosclerosis. Electronically Signed   By: Ashley Royalty M.D.   On: 09/25/2016 18:36   Dg Chest 2 View  Result Date: 09/28/2016 CLINICAL DATA:  Nausea and vomiting. EXAM: CHEST  2 VIEW COMPARISON:  September 25, 2016 FINDINGS: Stable cardiomegaly. The hila and mediastinum are unchanged. Mild increased interstitial markings. No other interval changes or acute abnormalities. IMPRESSION: Mild increased interstitial markings may represent pulmonary venous congestion versus bronchitic changes. Recommend clinical correlation. Electronically Signed   By: Dorise Bullion III M.D   On: 09/28/2016 15:50   Dg Abd 1 View  Result Date: 10/01/2016 CLINICAL DATA:  Constipation and diarrhea. EXAM:  ABDOMEN - 1 VIEW COMPARISON:  10/26/2015. FINDINGS: Cardiac pacer with lead tips projected the right atrium and right ventricle. Surgical clips right upper quadrant. Prominent amount stool noted throughout the colon. No bowel distention. No free air. Lumbar spine scoliosis and degenerative change. Degenerative changes both hips. Visceral atherosclerotic vascular calcification. IMPRESSION: 1. Prominent amount of stool noted throughout colon suggesting constipation. No prominent bowel distention. 2. Scoliosis and degenerative change lumbar spine. Degenerative changes both hips. 3. Atherosclerotic vascular disease. Electronically Signed   By: Marcello Moores  Register   On: 10/01/2016 12:21   Dg Chest Port 1 View  Result Date: 10/01/2016 CLINICAL DATA:  Followup respiratory failure EXAM: PORTABLE CHEST 1 VIEW COMPARISON:  Portable chest x-ray of 09/30/2016 FINDINGS: No active infiltrate or effusion is seen. Mild pulmonary vascular congestion has improved. Cardiomegaly is stable. Newly permanent pacemaker remains. A lower anterior cervical spine fusion plate is present. IMPRESSION: Improved aeration with resolution of pulmonary vascular congestion. Stable cardiomegaly with permanent pacemaker. Electronically Signed   By: Ivar Drape M.D.   On: 10/01/2016 08:05   Dg Chest Port 1 View  Result Date: 09/30/2016 CLINICAL DATA:  Bradycardia, history asthma, COPD, type II diabetes mellitus, hypertension, coronary artery disease EXAM: PORTABLE CHEST 1 VIEW COMPARISON:  Portable exam 0556 hours compared to 09/29/2016 FINDINGS: LEFT subclavian transvenous pacemaker leads project over RIGHT atrium and RIGHT ventricle. Enlargement of cardiac silhouette. Calcified tortuous thoracic aorta. Mild pulmonary vascular congestion. Bronchitic changes with minimal RIGHT basilar atelectasis. Lungs otherwise clear without infiltrate, pleural effusion, or pneumothorax. Osseous demineralization. Prior cervicothoracic fusion. IMPRESSION:  Enlargement of cardiac silhouette with pulmonary vascular congestion. Bronchitic changes with mild RIGHT basilar atelectasis. Electronically Signed   By: Lavonia Dana M.D.   On: 09/30/2016 07:54   Dg Chest Port 1 View  Result Date: 09/29/2016 CLINICAL DATA:  Postoperative radiograph, status post pacemaker placement. Initial encounter. EXAM: PORTABLE CHEST 1 VIEW COMPARISON:  Chest radiograph performed earlier today at 3:59 a.m. FINDINGS: The lungs are well-aerated and clear. There is no evidence of focal opacification, pleural effusion or pneumothorax. The cardiomediastinal silhouette is mildly enlarged. A pacemaker is noted overlying the left chest wall, with leads ending overlying the right atrium and right ventricle. No acute osseous abnormalities are seen. Clips are noted within the right upper quadrant, reflecting prior cholecystectomy. Cervical spinal fusion hardware is partially imaged. IMPRESSION: Mild cardiomegaly.  Lungs remain grossly clear. Electronically Signed   By: Garald Balding M.D.   On: 09/29/2016 20:34   Dg Chest Port 1 View  Result Date: 09/29/2016 CLINICAL DATA:  Respiratory failure. EXAM:  PORTABLE CHEST 1 VIEW COMPARISON:  09/28/2016. FINDINGS: Cardiomegaly with normal pulmonary vascularity. Medial right base atelectasis and or infiltrate. No pleural effusion or pneumothorax. Cardiomegaly. No pneumothorax. Prior cervical spine fusion. IMPRESSION: 1.  Medial right base atelectasis and or infiltrate. 2. Stable cardiomegaly. Electronically Signed   By: Marcello Moores  Register   On: 09/29/2016 06:53   US Abdomen Limited Ruq  Result Date: 09/29/2016 CLINICAL DATA:  Acute onset of elevated LFTs. Nausea and vomiting. Initial encounter. EXAM: US ABDOMEN LIMITED - RIGHT UPPER QUADRANT COMPARISON:  None. FINDINGS: Gallbladder: Status post cholecystectomy.  No retained stones seen. Common bile duct: Diameter: 0.5 cm, within normal limits in caliber. Liver: No focal lesion identified. Within normal  limits in parenchymal echogenicity. IMPRESSION: Unremarkable ultrasound of the right upper quadrant. Status post cholecystectomy. Electronically Signed   By: Garald Balding M.D.   On: 09/29/2016 21:44       Subjective: Feeling better, she was able to move Bowel. Had 2 BM.   Discharge Exam: Vitals:   10/03/16 0905 10/03/16 1029  BP:  (!) 158/77  Pulse: 60   Resp:    Temp:     Vitals:   10/03/16 0429 10/03/16 0830 10/03/16 0905 10/03/16 1029  BP: (!) 154/63 (!) 156/74  (!) 158/77  Pulse:   60   Resp: 18     Temp: 97.7 F (36.5 C)     TempSrc: Oral     SpO2: 99%     Weight:      Height:        General: Pt is alert, awake, not in acute distress Cardiovascular: RRR, S1/S2 +, no rubs, no gallops Respiratory: CTA bilaterally, no wheezing, no rhonchi Abdominal: Soft, NT, ND, bowel sounds + Extremities: no edema, no cyanosis    The results of significant diagnostics from this hospitalization (including imaging, microbiology, ancillary and laboratory) are listed below for reference.     Microbiology: Recent Results (from the past 240 hour(s))  MRSA PCR Screening     Status: None   Collection Time: 09/28/16  8:27 PM  Result Value Ref Range Status   MRSA by PCR NEGATIVE NEGATIVE Final    Comment:        The GeneXpert MRSA Assay (FDA approved for NASAL specimens only), is one component of a comprehensive MRSA colonization surveillance program. It is not intended to diagnose MRSA infection nor to guide or monitor treatment for MRSA infections.   Surgical pcr screen     Status: None   Collection Time: 09/29/16  5:56 AM  Result Value Ref Range Status   MRSA, PCR NEGATIVE NEGATIVE Final   Staphylococcus aureus NEGATIVE NEGATIVE Final    Comment:        The Xpert SA Assay (FDA approved for NASAL specimens in patients over 23 years of age), is one component of a comprehensive surveillance program.  Test performance has been validated by [facility] for patients  greater than or equal to 78 year old. It is not intended to diagnose infection nor to guide or monitor treatment.      Labs: BNP (last 3 results)  Recent Labs  10/01/16 0450  BNP [id number]*   Basic Metabolic Panel:  Recent Labs Lab 09/29/16 0211 09/29/16 1953 09/30/16 1226 10/01/16 0450 10/02/16 0258 10/03/16 0421  NA 134* 136 128* 131* 130* 133*  K 5.0 5.8* 4.3 3.9 3.3* 3.7  CL 102 103 99* 99* 100* 101  CO2 22 21* 17* 23 22 23   GLUCOSE 90 179* 293*  66 68 71  BUN 43* 35* 36* 26* 21* 20  CREATININE 2.31* 1.65* 1.45* 1.06* 0.93 0.83  CALCIUM 8.8* 8.9 7.9* 8.0* 7.6* 8.3*  MG 2.0  --   --   --   --   --   PHOS 4.4  --   --   --   --   --    Liver Function Tests:  Recent Labs Lab 09/28/16 1551 09/29/16 1953 10/01/16 0450  AST 1,969* 867* 360*  ALT 1,118* 991* 304*  ALKPHOS 115 128* 91  BILITOT 0.4 0.4 0.6  PROT 6.3* 6.6 5.0*  ALBUMIN 3.6 3.5 2.6*    Recent Labs Lab 09/28/16 1551 10/01/16 1241  LIPASE 28 30   No results for input(s): AMMONIA in the last 168 hours. CBC:  Recent Labs Lab 09/28/16 1551 09/29/16 0211 10/01/16 0450  WBC 15.6* 16.0* 10.3  NEUTROABS 13.4*  --   --   HGB 9.4* 9.5* 9.1*  HCT 27.4* 28.0* 26.9*  MCV 92.3 91.2 90.9  PLT 354 304 235   Cardiac Enzymes: No results for input(s): CKTOTAL, CKMB, CKMBINDEX, TROPONINI in the last 168 hours. BNP: Invalid input(s): POCBNP CBG:  Recent Labs Lab 10/02/16 2329 10/03/16 0428 10/03/16 0526 10/03/16 0805 10/03/16 1145  GLUCAP 252* 66 80 101* 260*   D-Dimer No results for input(s): DDIMER in the last 72 hours. Hgb A1c No results for input(s): HGBA1C in the last 72 hours. Lipid Profile No results for input(s): CHOL, HDL, LDLCALC, TRIG, CHOLHDL, LDLDIRECT in the last 72 hours. Thyroid function studies No results for input(s): TSH, T4TOTAL, T3FREE, THYROIDAB in the last 72 hours.  Invalid input(s): FREET3 Anemia work up No results for input(s): VITAMINB12, FOLATE, FERRITIN,  TIBC, IRON, RETICCTPCT in the last 72 hours. Urinalysis    Component Value Date/Time   COLORURINE YELLOW 09/28/2016 [place] 09/28/2016 1608   LABSPEC 1.016 09/28/2016 1608   PHURINE 6.0 09/28/2016 1608   GLUCOSEU 250 (A) 09/28/2016 1608   HGBUR NEGATIVE 09/28/2016 1608   BILIRUBINUR NEGATIVE 09/28/2016 1608   KETONESUR NEGATIVE 09/28/2016 1608   PROTEINUR 100 (A) 09/28/2016 1608   UROBILINOGEN 1.0 06/12/2015 1937   NITRITE NEGATIVE 09/28/2016 1608   LEUKOCYTESUR NEGATIVE 09/28/2016 1608   Sepsis Labs Invalid input(s): PROCALCITONIN,  WBC,  LACTICIDVEN Microbiology Recent Results (from the past 240 hour(s))  MRSA PCR Screening     Status: None   Collection Time: 09/28/16  8:27 PM  Result Value Ref Range Status   MRSA by PCR NEGATIVE NEGATIVE Final    Comment:        The GeneXpert MRSA Assay (FDA approved for NASAL specimens only), is one component of a comprehensive MRSA colonization surveillance program. It is not intended to diagnose MRSA infection nor to guide or monitor treatment for MRSA infections.   Surgical pcr screen     Status: None   Collection Time: 09/29/16  5:56 AM  Result Value Ref Range Status   MRSA, PCR NEGATIVE NEGATIVE Final   Staphylococcus aureus NEGATIVE NEGATIVE Final    Comment:        The Xpert SA Assay (FDA approved for NASAL specimens in patients over 80 years of age), is one component of a comprehensive surveillance program.  Test performance has been validated by [facility] for patients greater than or equal to 38 year old. It is not intended to diagnose infection nor to guide or monitor treatment.      Time coordinating discharge: Over 30 minutes  SIGNED:   Elmarie Shiley, MD  [facility] 10/03/2016, 11:54 AM Pager [phone]  If 7PM-7AM, please contact night-coverage [email or web address] Password [email or web address]

## 2016-10-03 NOTE — Progress Notes (Signed)
Hypoglycemic Event  CBG:66  Treatment:1 cup orange juice  Symptoms:none  Follow-up CBG: Time:0526 CBG Result:80  Possible Reasons for Event:unknown  Comments/MD notified:no    Michelle [name], [name] [employer]

## 2016-10-03 NOTE — Progress Notes (Signed)
Physical Therapy Treatment Patient Details Name: SAILOR KALLENBERGER MRN: [id number] DOB: 1932/11/10 Today's Date: 10/03/2016    History of Present Illness 82 yo female ED with nausea, vomiting, hyperglycemia. She was found to be in complete heart block in ED with a ventricular escape rythm at 20 bpm with periods of asystole in the ED. She was transferred to [facility] from [place] and temporary pacemaker placed.  Permanant pace maker put in on 09/29/16.     PT Comments    Pt has chronic vertigo and was given Meclizine this AM by daughter with significant relief in symptoms (no dizziness with mobility), however, this likely reduced the accuracy of my vestibular testing (no dizziness, some very mild nystagmus at end range-which may be stretch reflex to the right and some saccadic-like eye movements with tracking).  No conclusive vestibular signs or symptoms.  MD in room and daughter reports she takes meclazine at home daily, and MD changed Meclizine here to scheduled.  PT will continue to follow acutely to monitor and assess vestibular symptoms and encourage mobility.    Follow Up Recommendations  Home health PT;Supervision/Assistance - 24 hour     Equipment Recommendations  None recommended by PT    Recommendations for Other Services   NA     Precautions / Restrictions Precautions Precautions: Fall    Mobility  Bed Mobility Overal bed mobility: Needs Assistance Bed Mobility: Rolling;Sidelying to Sit Rolling: Min assist Sidelying to sit: Mod assist       General bed mobility comments: Min assist to roll bil for peri care as pt had a BM in the bed.  She did not get dizzy with rolling bil today and did last session.   Mod assist needed at trunk to get to sitting EOB from flat bed with rails.   Transfers Overall transfer level: Needs assistance Equipment used: Rolling walker (2 wheeled) Transfers: Sit to/from [facility] Sit to Stand: Mod assist;Min assist         General transfer comment: Mod assist initially to stand from bed, min assist with repeated sit to stands and transfers from [facility].   Ambulation/Gait Ambulation/Gait assistance: Min assist Ambulation Distance (Feet): 5 Feet Assistive device: Rolling walker (2 wheeled) Gait Pattern/deviations: Step-through pattern;Shuffle;Trunk flexed Gait velocity: decreased   General Gait Details: Pt with slow, shuffling gait, weak on her feet as she has not been very mobile here in the hospital.  Daughter reports she is flexed at baseline.        Balance Overall balance assessment: Needs assistance Sitting-balance support: Feet supported;Bilateral upper extremity supported Sitting balance-Leahy Scale: Fair     Standing balance support: Bilateral upper extremity supported Standing balance-Leahy Scale: Poor                      Cognition Arousal/Alertness: Awake/alert Behavior During Therapy: WFL for tasks assessed/performed Overall Cognitive Status: Within Functional Limits for tasks assessed                         General Comments General comments (skin integrity, edema, etc.): Skin around anal area red, but not broken, barrier cream applied after peri care preformed.        Pertinent Vitals/Pain Pain Assessment: No/denies pain           PT Goals (current goals can now be found in the care plan section) Acute Rehab PT Goals Patient Stated Goal: want to go home as soon as  possible.  Progress towards PT goals: Progressing toward goals    Frequency    Min 3X/week      PT Plan Current plan remains appropriate       End of Session   Activity Tolerance: Patient limited by pain;Patient limited by fatigue Patient left: in chair;with call bell/phone within reach;with family/visitor present     Time: [id number] PT Time Calculation (min) (ACUTE ONLY): 28 min  Charges:  $Therapeutic Activity: 23-37 mins                      Dodger Sinning B. Female Minish, PT, DPT  [phone]   10/03/2016, 10:03 AM

## 2016-10-03 NOTE — Care Management Note (Addendum)
Case Management Note  Patient Details  Name: Michelle [name] MRN: [id number] Date of Birth: 1932/06/02  Subjective/Objective:                    Action/Plan:  PTA from home with 24 hour private duty paid care - do not require [facility] Aide as recommended per daughter.  Pt daughter lives across the street and is very supportive.  Pt has wheelchair, walker, shower chair and bedside commode - daughter denied needing additional equipment.  Pt is already active with [facility] for [facility] and PT - CM will request resumption orders and notified agency that pt is admitted.   Expected Discharge Date:                  Expected Discharge Plan:  [place]  In-House Referral:     Discharge planning Services  CM Consult  Post Acute Care Choice:    Choice offered to:     DME Arranged:    DME Agency:     HH Arranged:  PT, RN [place] Agency:  [facility] (now [facility])  Status of Service:  In process, will continue to follow  If discussed at Long Length of Stay Meetings, dates discussed:    Additional Comments:  Maryclare Labrador, RN 10/03/2016, 11:25 AM

## 2016-10-04 LAB — GLUCOSE, CAPILLARY
Glucose-Capillary: 173 mg/dL — ABNORMAL HIGH (ref 65–99)
Glucose-Capillary: 202 mg/dL — ABNORMAL HIGH (ref 65–99)
Glucose-Capillary: 71 mg/dL (ref 65–99)

## 2016-10-04 NOTE — Discharge Summary (Signed)
Physician Discharge Summary  Michelle [name] [id number] DOB: 04-09-1932 DOA: 09/28/2016  PCP: [name], MD  Admit date: 09/28/2016 Discharge date: 10/04/2016  Admitted From: Home  Disposition:  Home   Recommendations for Outpatient Follow-up:  1. Follow up with PCP in 1-2 weeks 2. Please obtain BMP/CBC in one week 3. Needs LFT in 1 week.   Home Health: yes.  Discharge Condition:Stable.  CODE STATUS: DNR Diet recommendation: Heart Healthy  Brief/Interim Summary: 82 year old with past medical history of asthma, COPD, CAD, diabetes, Alzheimers dementia with known history of conduction system disease, sinus node disease. She has declined pacemaker in the past. She was admitted and November with hyperglycemia, transient complete heart block which was started on epinephrine infusion with resolution of heart block.  She returns to the ED with nausea, vomiting, hyperglycemia. She was found to be in complete heart block in ED with a ventricular escape rythm at 20 bpm with periods of asystole in the ED. She was transferred to [facility] from [place] and temporary pacemaker placed. PCCM consulted for admission.    Assessment & Plan;   Nausea, vomiting; resolved Check KUB. Consistent with constipation. Fleet ordered.  [place] negative.  Lipase normal. Had 2 BM  home today if tolerates diet   Discharge delay yesterday because family thought patient was too weak.  Stable today to be discharge,   Complete heart block. S/p temporary Pacemaker PPM placed 11/20  HTN; will hold Micardis due to AKI.  Started low dose norvasc.   DM;  Hold NPH , poor oral intake.  PRN SSI  Transaminases; shock liver, trending down.  Unremarkable ultrasound of the right upper quadrant. Status post Cholecystectomy. needs repeat labs in 1 week.   AKI in setting of cardiogenic shock, ATN Hyperkalemia Creatinine down  Improving.   Polymyagia rheumatica Hypothyroidism TSH-  3.4 Continue synthyroid Resume chronic dose of prednisone.   COPD;  Enlargement of cardiac silhouette with pulmonary vascular congestion. Bronchitic changes with mild RIGHT basilar atelectasis. Duonebs Continue Spiriva Resume home dose prednisone.  OOB as tolerated Pulmonary Toilet as able  Alzheimer dementia   Discharge Diagnoses:  Active Problems:   Complete heart block (HCC)   Cardiogenic shock (HCC)   Elevated troponin   Shock liver    Discharge Instructions  Discharge Instructions    Diet - low sodium heart healthy    Complete by:  As directed    Diet - low sodium heart healthy    Complete by:  As directed    Increase activity slowly    Complete by:  As directed    Increase activity slowly    Complete by:  As directed        Medication List    STOP taking these medications   cefixime 100 MG/5ML suspension Commonly known as:  SUPRAX   cefUROXime 250 MG tablet Commonly known as:  CEFTIN   ciprofloxacin 250 MG tablet Commonly known as:  CIPRO   insulin NPH Human 100 UNIT/ML injection Commonly known as:  HUMULIN N,NOVOLIN N   telmisartan 40 MG tablet Commonly known as:  MICARDIS     TAKE these medications   acetaminophen 325 MG tablet Commonly known as:  TYLENOL Take 2 tablets (650 mg total) by mouth every 6 (six) hours as needed for mild pain (or Fever >/= 101).   albuterol 108 (90 Base) MCG/ACT inhaler Commonly known as:  PROVENTIL HFA;VENTOLIN HFA Inhale 1-2 puffs into the lungs every 4 (four) hours as needed for wheezing or  shortness of breath.   amLODipine 2.5 MG tablet Commonly known as:  NORVASC Take 1 tablet (2.5 mg total) by mouth daily.   aspirin 81 MG tablet Take 81 mg by mouth daily.   CALCIUM 1200 PO Take 1 tablet by mouth daily.   carvedilol 6.25 MG tablet Commonly known as:  COREG Take 1 tablet (6.25 mg total) by mouth 2 (two) times daily with a meal.   cyanocobalamin 100 MCG tablet Take 1 tablet (100 mcg total) by  mouth daily.   diphenhydramine-acetaminophen 25-500 MG Tabs tablet Commonly known as:  TYLENOL PM Take 2 tablets by mouth at bedtime.   feeding supplement (PRO-STAT SUGAR FREE 64) Liqd Take 30 mLs by mouth 2 (two) times daily.   fentaNYL 25 MCG/HR patch Commonly known as:  DURAGESIC - dosed mcg/hr Place 25 mcg onto the skin every 3 (three) days.   ferrous sulfate 325 (65 FE) MG tablet Take 325 mg by mouth daily with breakfast.   GLUCERNA Liqd Take 237 mLs by mouth 2 (two) times daily between meals. What changed:  when to take this   HYDROcodone-acetaminophen 5-325 MG tablet Commonly known as:  NORCO/VICODIN Take 1 tablet by mouth every 6 (six) hours as needed for moderate pain.   insulin lispro 100 UNIT/ML injection Commonly known as:  HUMALOG Inject 0-0.06 mLs (0-6 Units total) into the skin 3 (three) times daily as needed for high blood sugar. Patient uses sliding scale   Insulin Syringe-Needle U-100 31G X 5/16" 1 ML Misc For injection of insulin QID   lactulose 10 GM/15ML solution Commonly known as:  CHRONULAC Take 45 mLs (30 g total) by mouth daily as needed for mild constipation.   levothyroxine 100 MCG tablet Commonly known as:  SYNTHROID, LEVOTHROID TAKE 1 TABLET (100 MCG TOTAL) BY MOUTH DAILY.   meclizine 12.5 MG tablet Commonly known as:  ANTIVERT Take 1 tablet (12.5 mg total) by mouth daily as needed for dizziness (vertigo).   OCUVITE PRESERVISION PO Take 1 capsule by mouth daily.   ONE TOUCH ULTRA TEST test strip Generic drug:  glucose blood TEST BS 5-6 TIMES A DAY   predniSONE 5 MG tablet Commonly known as:  DELTASONE Take 5 mg by mouth daily.   psyllium 95 % Pack Commonly known as:  HYDROCIL/METAMUCIL Take 1 packet by mouth 3 (three) times daily.   tamsulosin 0.4 MG Caps capsule Commonly known as:  FLOMAX Take 1 capsule (0.4 mg total) by mouth daily after breakfast.   tiotropium 18 MCG inhalation capsule Commonly known as:  SPIRIVA  HANDIHALER Place 1 capsule (18 mcg total) into inhaler and inhale daily.   traMADol 50 MG tablet Commonly known as:  ULTRAM Take 50 mg by mouth daily as needed for moderate pain.   UNABLE TO FIND Take 1 tablet by mouth daily. Med Name: probiotic capsule   Takes one daily.   Vitamin D3 2000 units Tabs Take 2,000 Units by mouth daily.      Follow-up Information    Cristopher Peru, MD Follow up on 10/15/2016.   Specialty:  Cardiology Why:  11:00AM, wound check visit with nurse only Contact information: [address] [address] [phone]        Cristopher Peru, MD Follow up on 01/09/2017.   Specialty:  Cardiology Why:  10:15AM Contact information: [place] [address] [phone]          No Known Allergies  Consultations:  Cardiology  Procedures/Studies: Dg Chest 1 View  Result Date: 09/25/2016 CLINICAL DATA:  Patient has been sick for 1 week with cough. EXAM: CHEST 1 VIEW COMPARISON:  08/23/2016 FINDINGS: The heart is top-normal in size. There is aortic atherosclerosis with mitral annular calcifications as well. Mild diffuse interstitial prominence is noted which may reflect bronchitic change. No pneumonic consolidations are noted. No effusion or pneumothorax. The patient is status post ACDF of the cervical spine. IMPRESSION: Mild diffuse interstitial prominence which may reflect bronchitic change. No pneumonic consolidation. No effusion. Aortic atherosclerosis. Electronically Signed   By: Ashley Royalty M.D.   On: 09/25/2016 18:36   Dg Chest 2 View  Result Date: 09/28/2016 CLINICAL DATA:  Nausea and vomiting. EXAM: CHEST  2 VIEW COMPARISON:  September 25, 2016 FINDINGS: Stable cardiomegaly. The hila and mediastinum are unchanged. Mild increased interstitial markings. No other interval changes or acute abnormalities. IMPRESSION: Mild increased interstitial markings may represent pulmonary venous congestion versus bronchitic  changes. Recommend clinical correlation. Electronically Signed   By: Dorise Bullion III M.D   On: 09/28/2016 15:50   Dg Abd 1 View  Result Date: 10/01/2016 CLINICAL DATA:  Constipation and diarrhea. EXAM: ABDOMEN - 1 VIEW COMPARISON:  10/26/2015. FINDINGS: Cardiac pacer with lead tips projected the right atrium and right ventricle. Surgical clips right upper quadrant. Prominent amount stool noted throughout the colon. No bowel distention. No free air. Lumbar spine scoliosis and degenerative change. Degenerative changes both hips. Visceral atherosclerotic vascular calcification. IMPRESSION: 1. Prominent amount of stool noted throughout colon suggesting constipation. No prominent bowel distention. 2. Scoliosis and degenerative change lumbar spine. Degenerative changes both hips. 3. Atherosclerotic vascular disease. Electronically Signed   By: Marcello Moores  Register   On: 10/01/2016 12:21   Dg Chest Port 1 View  Result Date: 10/01/2016 CLINICAL DATA:  Followup respiratory failure EXAM: PORTABLE CHEST 1 VIEW COMPARISON:  Portable chest x-ray of 09/30/2016 FINDINGS: No active infiltrate or effusion is seen. Mild pulmonary vascular congestion has improved. Cardiomegaly is stable. Newly permanent pacemaker remains. A lower anterior cervical spine fusion plate is present. IMPRESSION: Improved aeration with resolution of pulmonary vascular congestion. Stable cardiomegaly with permanent pacemaker. Electronically Signed   By: Ivar Drape M.D.   On: 10/01/2016 08:05   Dg Chest Port 1 View  Result Date: 09/30/2016 CLINICAL DATA:  Bradycardia, history asthma, COPD, type II diabetes mellitus, hypertension, coronary artery disease EXAM: PORTABLE CHEST 1 VIEW COMPARISON:  Portable exam 0556 hours compared to 09/29/2016 FINDINGS: LEFT subclavian transvenous pacemaker leads project over RIGHT atrium and RIGHT ventricle. Enlargement of cardiac silhouette. Calcified tortuous thoracic aorta. Mild pulmonary vascular congestion.  Bronchitic changes with minimal RIGHT basilar atelectasis. Lungs otherwise clear without infiltrate, pleural effusion, or pneumothorax. Osseous demineralization. Prior cervicothoracic fusion. IMPRESSION: Enlargement of cardiac silhouette with pulmonary vascular congestion. Bronchitic changes with mild RIGHT basilar atelectasis. Electronically Signed   By: Lavonia Dana M.D.   On: 09/30/2016 07:54   Dg Chest Port 1 View  Result Date: 09/29/2016 CLINICAL DATA:  Postoperative radiograph, status post pacemaker placement. Initial encounter. EXAM: PORTABLE CHEST 1 VIEW COMPARISON:  Chest radiograph performed earlier today at 3:59 a.m. FINDINGS: The lungs are well-aerated and clear. There is no evidence of focal opacification, pleural effusion or pneumothorax. The cardiomediastinal silhouette is mildly enlarged. A pacemaker is noted overlying the left chest wall, with leads ending overlying the right atrium and right ventricle. No acute osseous abnormalities are seen. Clips are noted within the right upper quadrant, reflecting prior cholecystectomy. Cervical spinal fusion  hardware is partially imaged. IMPRESSION: Mild cardiomegaly.  Lungs remain grossly clear. Electronically Signed   By: Garald Balding M.D.   On: 09/29/2016 20:34   Dg Chest Port 1 View  Result Date: 09/29/2016 CLINICAL DATA:  Respiratory failure. EXAM: PORTABLE CHEST 1 VIEW COMPARISON:  09/28/2016. FINDINGS: Cardiomegaly with normal pulmonary vascularity. Medial right base atelectasis and or infiltrate. No pleural effusion or pneumothorax. Cardiomegaly. No pneumothorax. Prior cervical spine fusion. IMPRESSION: 1.  Medial right base atelectasis and or infiltrate. 2. Stable cardiomegaly. Electronically Signed   By: Marcello Moores  Register   On: 09/29/2016 06:53   US Abdomen Limited Ruq  Result Date: 09/29/2016 CLINICAL DATA:  Acute onset of elevated LFTs. Nausea and vomiting. Initial encounter. EXAM: US ABDOMEN LIMITED - RIGHT UPPER QUADRANT  COMPARISON:  None. FINDINGS: Gallbladder: Status post cholecystectomy.  No retained stones seen. Common bile duct: Diameter: 0.5 cm, within normal limits in caliber. Liver: No focal lesion identified. Within normal limits in parenchymal echogenicity. IMPRESSION: Unremarkable ultrasound of the right upper quadrant. Status post cholecystectomy. Electronically Signed   By: Garald Balding M.D.   On: 09/29/2016 21:44      Subjective: Feeling better, she was able to move Bowel. Had 2 BM.   Discharge Exam: Vitals:   10/03/16 2024 10/04/16 0454  BP: (!) 114/54 (!) 158/63  Pulse: (!) 59 (!) 57  Resp: 18 18  Temp: 98.5 F (36.9 C) 97.4 F (36.3 C)   Vitals:   10/03/16 1348 10/03/16 1736 10/03/16 2024 10/04/16 0454  BP: (!) 117/58  (!) 114/54 (!) 158/63  Pulse: 60 60 (!) 59 (!) 57  Resp: 18  18 18   Temp: 97.5 F (36.4 C)  98.5 F (36.9 C) 97.4 F (36.3 C)  TempSrc: Oral  Oral Oral  SpO2: 100%  98% 100%  Weight:      Height:        General: Pt is alert, awake, not in acute distress Cardiovascular: RRR, S1/S2 +, no rubs, no gallops Respiratory: CTA bilaterally, no wheezing, no rhonchi Abdominal: Soft, NT, ND, bowel sounds + Extremities: no edema, no cyanosis    The results of significant diagnostics from this hospitalization (including imaging, microbiology, ancillary and laboratory) are listed below for reference.     Microbiology: Recent Results (from the past 240 hour(s))  MRSA PCR Screening     Status: None   Collection Time: 09/28/16  8:27 PM  Result Value Ref Range Status   MRSA by PCR NEGATIVE NEGATIVE Final    Comment:        The GeneXpert MRSA Assay (FDA approved for NASAL specimens only), is one component of a comprehensive MRSA colonization surveillance program. It is not intended to diagnose MRSA infection nor to guide or monitor treatment for MRSA infections.   Surgical pcr screen     Status: None   Collection Time: 09/29/16  5:56 AM  Result Value Ref  Range Status   MRSA, PCR NEGATIVE NEGATIVE Final   Staphylococcus aureus NEGATIVE NEGATIVE Final    Comment:        The Xpert SA Assay (FDA approved for NASAL specimens in patients over 6 years of age), is one component of a comprehensive surveillance program.  Test performance has been validated by [facility] for patients greater than or equal to 58 year old. It is not intended to diagnose infection nor to guide or monitor treatment.      Labs: BNP (last 3 results)  Recent Labs  10/01/16 0450  BNP  99991111*   Basic Metabolic Panel:  Recent Labs Lab 09/29/16 0211 09/29/16 1953 09/30/16 1226 10/01/16 0450 10/02/16 0258 10/03/16 0421  NA 134* 136 128* 131* 130* 133*  K 5.0 5.8* 4.3 3.9 3.3* 3.7  CL 102 103 99* 99* 100* 101  CO2 22 21* 17* 23 22 23   GLUCOSE 90 179* 293* 66 68 71  BUN 43* 35* 36* 26* 21* 20  CREATININE 2.31* 1.65* 1.45* 1.06* 0.93 0.83  CALCIUM 8.8* 8.9 7.9* 8.0* 7.6* 8.3*  MG 2.0  --   --   --   --   --   PHOS 4.4  --   --   --   --   --    Liver Function Tests:  Recent Labs Lab 09/28/16 1551 09/29/16 1953 10/01/16 0450  AST 1,969* 867* 360*  ALT 1,118* 991* 304*  ALKPHOS 115 128* 91  BILITOT 0.4 0.4 0.6  PROT 6.3* 6.6 5.0*  ALBUMIN 3.6 3.5 2.6*    Recent Labs Lab 09/28/16 1551 10/01/16 1241  LIPASE 28 30   No results for input(s): AMMONIA in the last 168 hours. CBC:  Recent Labs Lab 09/28/16 1551 09/29/16 0211 10/01/16 0450  WBC 15.6* 16.0* 10.3  NEUTROABS 13.4*  --   --   HGB 9.4* 9.5* 9.1*  HCT 27.4* 28.0* 26.9*  MCV 92.3 91.2 90.9  PLT 354 304 235   Cardiac Enzymes: No results for input(s): CKTOTAL, CKMB, CKMBINDEX, TROPONINI in the last 168 hours. BNP: Invalid input(s): POCBNP CBG:  Recent Labs Lab 10/03/16 1619 10/03/16 2137 10/04/16 0016 10/04/16 0436 10/04/16 0712  GLUCAP 316* 321* 173* 71 202*   D-Dimer No results for input(s): DDIMER in the last 72 hours. Hgb A1c No results for input(s):  HGBA1C in the last 72 hours. Lipid Profile No results for input(s): CHOL, HDL, LDLCALC, TRIG, CHOLHDL, LDLDIRECT in the last 72 hours. Thyroid function studies No results for input(s): TSH, T4TOTAL, T3FREE, THYROIDAB in the last 72 hours.  Invalid input(s): FREET3 Anemia work up No results for input(s): VITAMINB12, FOLATE, FERRITIN, TIBC, IRON, RETICCTPCT in the last 72 hours. Urinalysis    Component Value Date/Time   COLORURINE YELLOW 09/28/2016 [place] 09/28/2016 1608   LABSPEC 1.016 09/28/2016 1608   PHURINE 6.0 09/28/2016 1608   GLUCOSEU 250 (A) 09/28/2016 1608   HGBUR NEGATIVE 09/28/2016 1608   BILIRUBINUR NEGATIVE 09/28/2016 1608   KETONESUR NEGATIVE 09/28/2016 1608   PROTEINUR 100 (A) 09/28/2016 1608   UROBILINOGEN 1.0 06/12/2015 1937   NITRITE NEGATIVE 09/28/2016 1608   LEUKOCYTESUR NEGATIVE 09/28/2016 1608   Sepsis Labs Invalid input(s): PROCALCITONIN,  WBC,  LACTICIDVEN Microbiology Recent Results (from the past 240 hour(s))  MRSA PCR Screening     Status: None   Collection Time: 09/28/16  8:27 PM  Result Value Ref Range Status   MRSA by PCR NEGATIVE NEGATIVE Final    Comment:        The GeneXpert MRSA Assay (FDA approved for NASAL specimens only), is one component of a comprehensive MRSA colonization surveillance program. It is not intended to diagnose MRSA infection nor to guide or monitor treatment for MRSA infections.   Surgical pcr screen     Status: None   Collection Time: 09/29/16  5:56 AM  Result Value Ref Range Status   MRSA, PCR NEGATIVE NEGATIVE Final   Staphylococcus aureus NEGATIVE NEGATIVE Final    Comment:        The Xpert SA Assay (FDA approved for NASAL  specimens in patients over 80 years of age), is one component of a comprehensive surveillance program.  Test performance has been validated by [facility] for patients greater than or equal to 44 year old. It is not intended to diagnose infection nor to guide or  monitor treatment.      Time coordinating discharge: Over 30 minutes  SIGNED:   Elmarie Shiley, MD  [facility] 10/04/2016, 10:17 AM Pager [phone]  If 7PM-7AM, please contact night-coverage [email or web address] Password [email or web address]

## 2016-10-04 NOTE — Progress Notes (Signed)
Discharged to home with family office visits in place teaching done  

## 2016-10-04 NOTE — Progress Notes (Signed)
CM met with pt to confirm [facility] is agency to resume HHPT/Aide/nurse services.  CM has notified [facility] rep, [place] of discharge.  No other CM needs were communicated.

## 2016-10-07 ENCOUNTER — Telehealth: Payer: Self-pay | Admitting: Family Medicine

## 2016-10-07 DIAGNOSIS — E119 Type 2 diabetes mellitus without complications: Secondary | ICD-10-CM | POA: Diagnosis not present

## 2016-10-07 DIAGNOSIS — N390 Urinary tract infection, site not specified: Secondary | ICD-10-CM | POA: Diagnosis not present

## 2016-10-07 DIAGNOSIS — B952 Enterococcus as the cause of diseases classified elsewhere: Secondary | ICD-10-CM | POA: Diagnosis not present

## 2016-10-07 DIAGNOSIS — G309 Alzheimer's disease, unspecified: Secondary | ICD-10-CM | POA: Diagnosis not present

## 2016-10-07 DIAGNOSIS — N183 Chronic kidney disease, stage 3 (moderate): Secondary | ICD-10-CM | POA: Diagnosis not present

## 2016-10-07 DIAGNOSIS — I129 Hypertensive chronic kidney disease with stage 1 through stage 4 chronic kidney disease, or unspecified chronic kidney disease: Secondary | ICD-10-CM | POA: Diagnosis not present

## 2016-10-07 NOTE — Care Management (Signed)
CM received phone call from daughter of pt Jannette Spanner.  Daughter confirmed that pt was discharged with [facility] provided by [facility] as recommended.  Daughter states that mom is now extremely deconditioned and has not been eating well since discharge on Sat 10/04/16.  Daughter also informed CM that the 24 hour care giver quit unexpectedly today, Jackelyn Poling works and other daughter is at home with a broken foot - they are unsure at this point how they are going to care for mom at home - and are now requesting SNF.  CM explained that since pt has been discharged - placement will need to be worked through [facility].  CM contacted [facility] and informed of situation; agency to obtain [place] order from PCP so placement could actively be sought.  CM also consulted with discharging unit CSW for exploration of other options.

## 2016-10-07 NOTE — Telephone Encounter (Signed)
Pt recently released from hospital 10/04/16. Set up with [facility] home health services and family had arranged for 24hr caregiver.  The caregiver has quit without notice and they need to place in SNF immediatly.  Came by to get FL-2 and order for Social worker from [facility] to assist patient and family with placement.  FL2 signed and rx given for Education officer, museum.

## 2016-10-08 ENCOUNTER — Encounter
Admission: RE | Admit: 2016-10-08 | Discharge: 2016-10-08 | Disposition: A | Discharge status: Home / Self Care | Payer: Medicare Other | Source: Ambulatory Visit | Attending: Internal Medicine | Admitting: Internal Medicine

## 2016-10-08 DIAGNOSIS — E039 Hypothyroidism, unspecified: Secondary | ICD-10-CM | POA: Diagnosis not present

## 2016-10-08 DIAGNOSIS — M6281 Muscle weakness (generalized): Secondary | ICD-10-CM | POA: Diagnosis not present

## 2016-10-08 DIAGNOSIS — G308 Other Alzheimer's disease: Secondary | ICD-10-CM | POA: Diagnosis not present

## 2016-10-08 DIAGNOSIS — Z950 Presence of cardiac pacemaker: Secondary | ICD-10-CM | POA: Diagnosis not present

## 2016-10-08 DIAGNOSIS — I2510 Atherosclerotic heart disease of native coronary artery without angina pectoris: Secondary | ICD-10-CM | POA: Diagnosis not present

## 2016-10-08 DIAGNOSIS — Z7982 Long term (current) use of aspirin: Secondary | ICD-10-CM | POA: Diagnosis not present

## 2016-10-08 DIAGNOSIS — Z794 Long term (current) use of insulin: Secondary | ICD-10-CM | POA: Diagnosis not present

## 2016-10-08 DIAGNOSIS — G309 Alzheimer's disease, unspecified: Secondary | ICD-10-CM | POA: Diagnosis not present

## 2016-10-08 DIAGNOSIS — N183 Chronic kidney disease, stage 3 (moderate): Secondary | ICD-10-CM | POA: Diagnosis not present

## 2016-10-08 DIAGNOSIS — I129 Hypertensive chronic kidney disease with stage 1 through stage 4 chronic kidney disease, or unspecified chronic kidney disease: Secondary | ICD-10-CM | POA: Diagnosis not present

## 2016-10-08 DIAGNOSIS — M353 Polymyalgia rheumatica: Secondary | ICD-10-CM | POA: Diagnosis not present

## 2016-10-08 DIAGNOSIS — R41841 Cognitive communication deficit: Secondary | ICD-10-CM | POA: Diagnosis not present

## 2016-10-08 DIAGNOSIS — J449 Chronic obstructive pulmonary disease, unspecified: Secondary | ICD-10-CM | POA: Diagnosis not present

## 2016-10-08 DIAGNOSIS — R109 Unspecified abdominal pain: Secondary | ICD-10-CM | POA: Diagnosis not present

## 2016-10-08 DIAGNOSIS — R1312 Dysphagia, oropharyngeal phase: Secondary | ICD-10-CM | POA: Diagnosis not present

## 2016-10-08 DIAGNOSIS — Z7952 Long term (current) use of systemic steroids: Secondary | ICD-10-CM | POA: Diagnosis not present

## 2016-10-08 DIAGNOSIS — Z466 Encounter for fitting and adjustment of urinary device: Secondary | ICD-10-CM | POA: Diagnosis not present

## 2016-10-08 DIAGNOSIS — N390 Urinary tract infection, site not specified: Secondary | ICD-10-CM | POA: Diagnosis not present

## 2016-10-08 DIAGNOSIS — R339 Retention of urine, unspecified: Secondary | ICD-10-CM | POA: Diagnosis not present

## 2016-10-08 DIAGNOSIS — F0280 Dementia in other diseases classified elsewhere without behavioral disturbance: Secondary | ICD-10-CM | POA: Diagnosis not present

## 2016-10-08 DIAGNOSIS — Z48812 Encounter for surgical aftercare following surgery on the circulatory system: Secondary | ICD-10-CM | POA: Diagnosis not present

## 2016-10-08 DIAGNOSIS — B952 Enterococcus as the cause of diseases classified elsewhere: Secondary | ICD-10-CM | POA: Diagnosis not present

## 2016-10-08 DIAGNOSIS — E119 Type 2 diabetes mellitus without complications: Secondary | ICD-10-CM | POA: Diagnosis not present

## 2016-10-08 DIAGNOSIS — R262 Difficulty in walking, not elsewhere classified: Secondary | ICD-10-CM | POA: Diagnosis not present

## 2016-10-08 DIAGNOSIS — E1165 Type 2 diabetes mellitus with hyperglycemia: Secondary | ICD-10-CM | POA: Diagnosis not present

## 2016-10-08 DIAGNOSIS — I10 Essential (primary) hypertension: Secondary | ICD-10-CM | POA: Diagnosis not present

## 2016-10-08 LAB — GLUCOSE, CAPILLARY: Glucose-Capillary: 184 mg/dL — ABNORMAL HIGH (ref 65–99)

## 2016-10-09 DIAGNOSIS — J449 Chronic obstructive pulmonary disease, unspecified: Secondary | ICD-10-CM | POA: Diagnosis not present

## 2016-10-09 DIAGNOSIS — E119 Type 2 diabetes mellitus without complications: Secondary | ICD-10-CM | POA: Diagnosis not present

## 2016-10-09 DIAGNOSIS — E039 Hypothyroidism, unspecified: Secondary | ICD-10-CM | POA: Diagnosis not present

## 2016-10-09 DIAGNOSIS — M353 Polymyalgia rheumatica: Secondary | ICD-10-CM | POA: Diagnosis not present

## 2016-10-09 DIAGNOSIS — G308 Other Alzheimer's disease: Secondary | ICD-10-CM | POA: Diagnosis not present

## 2016-10-09 DIAGNOSIS — I2510 Atherosclerotic heart disease of native coronary artery without angina pectoris: Secondary | ICD-10-CM | POA: Diagnosis not present

## 2016-10-09 DIAGNOSIS — Z950 Presence of cardiac pacemaker: Secondary | ICD-10-CM | POA: Diagnosis not present

## 2016-10-09 LAB — GLUCOSE, CAPILLARY
GLUCOSE-CAPILLARY: 309 mg/dL — AB (ref 65–99)
GLUCOSE-CAPILLARY: 204 mg/dL — AB (ref 65–99)
Glucose-Capillary: 387 mg/dL — ABNORMAL HIGH (ref 65–99)
Glucose-Capillary: 295 mg/dL — ABNORMAL HIGH (ref 65–99)
Glucose-Capillary: 175 mg/dL — ABNORMAL HIGH (ref 65–99)

## 2016-10-10 ENCOUNTER — Telehealth: Payer: Self-pay | Admitting: Family Medicine

## 2016-10-10 ENCOUNTER — Encounter
Admission: RE | Admit: 2016-10-10 | Discharge: 2016-10-10 | Disposition: A | Discharge status: Home / Self Care | Payer: Medicare Other | Source: Ambulatory Visit | Attending: Internal Medicine | Admitting: Internal Medicine

## 2016-10-10 LAB — GLUCOSE, CAPILLARY
GLUCOSE-CAPILLARY: 437 mg/dL — AB (ref 65–99)
GLUCOSE-CAPILLARY: 315 mg/dL — AB (ref 65–99)
GLUCOSE-CAPILLARY: 122 mg/dL — AB (ref 65–99)
Glucose-Capillary: 371 mg/dL — ABNORMAL HIGH (ref 65–99)
Glucose-Capillary: 495 mg/dL — ABNORMAL HIGH (ref 65–99)

## 2016-10-10 NOTE — Telephone Encounter (Signed)
Received a phone call from Jannette Spanner stating that her mother's blood sugar was 500 this am and that the nursing home was not giving her insulin that they did not have orders for her insulin. I told her I would call and find out what was going on with her insulin. I called and spoke to [facility] nurse in charge of taking care of pt and she said there was a big misunderstanding that she is getting her insulin as per standing orders from their facility MD/NP. Nurse Jackelyn Poling states that she started out with Novolin N 5 units this am and because it was so high the NP changed it to 10u in the am and 5 u in the pm. She is also on a Humilin R sliding scale. 150-250 3 u, 251-350 6, 351-450 10 u and anything over 450 they call MD. Nurse Jackelyn Poling did states that the had has had 3 ensures and pancakes for breakfast and pt proceeded to drink her syrup like water - straight from the glass, so her BS is going to be high after all that. She states that she did give family education about dm and the difference in insulin and their procedure. She also stated that the family says they give her insulin just as needed on a sliding scale not on a regular timed basis. If they check her bs at anytime and it is high they give her insulin and she explained that that is not how they do things there and she can not do that she has to follow protocol. She states that I do not need to send over order for this as their MD on site takes care of all this. She was very pleasant to talk to and was very informative about the pt. I told her if that was their policy and they are checking her BS according to protocol then I do not need to send over an order.

## 2016-10-11 LAB — GLUCOSE, CAPILLARY
GLUCOSE-CAPILLARY: 317 mg/dL — AB (ref 65–99)
GLUCOSE-CAPILLARY: 179 mg/dL — AB (ref 65–99)
Glucose-Capillary: 234 mg/dL — ABNORMAL HIGH (ref 65–99)
Glucose-Capillary: 411 mg/dL — ABNORMAL HIGH (ref 65–99)

## 2016-10-12 LAB — GLUCOSE, CAPILLARY
Glucose-Capillary: 328 mg/dL — ABNORMAL HIGH (ref 65–99)
Glucose-Capillary: 249 mg/dL — ABNORMAL HIGH (ref 65–99)
Glucose-Capillary: 264 mg/dL — ABNORMAL HIGH (ref 65–99)
Glucose-Capillary: 84 mg/dL (ref 65–99)

## 2016-10-13 LAB — GLUCOSE, CAPILLARY
GLUCOSE-CAPILLARY: 228 mg/dL — AB (ref 65–99)
GLUCOSE-CAPILLARY: 184 mg/dL — AB (ref 65–99)
Glucose-Capillary: 146 mg/dL — ABNORMAL HIGH (ref 65–99)

## 2016-10-14 LAB — GLUCOSE, CAPILLARY
GLUCOSE-CAPILLARY: 253 mg/dL — AB (ref 65–99)
Glucose-Capillary: 191 mg/dL — ABNORMAL HIGH (ref 65–99)
Glucose-Capillary: 231 mg/dL — ABNORMAL HIGH (ref 65–99)
Glucose-Capillary: 193 mg/dL — ABNORMAL HIGH (ref 65–99)

## 2016-10-15 ENCOUNTER — Ambulatory Visit: Payer: Medicare Other

## 2016-10-15 ENCOUNTER — Ambulatory Visit (INDEPENDENT_AMBULATORY_CARE_PROVIDER_SITE_OTHER): Payer: Medicare Other | Admitting: Ophthalmology

## 2016-10-15 LAB — GLUCOSE, CAPILLARY
GLUCOSE-CAPILLARY: 115 mg/dL — AB (ref 65–99)
GLUCOSE-CAPILLARY: 305 mg/dL — AB (ref 65–99)
GLUCOSE-CAPILLARY: 288 mg/dL — AB (ref 65–99)
Glucose-Capillary: 66 mg/dL (ref 65–99)

## 2016-10-16 ENCOUNTER — Ambulatory Visit: Payer: Medicare Other

## 2016-10-16 LAB — CBC WITH DIFFERENTIAL/PLATELET
BASOS PCT: 1 %
Basophils Absolute: 0 10*3/uL (ref 0–0.1)
Eosinophils Absolute: 0 10*3/uL (ref 0–0.7)
Eosinophils Relative: 0 %
HEMATOCRIT: 31.4 % — AB (ref 35.0–47.0)
HEMOGLOBIN: 10.8 g/dL — AB (ref 12.0–16.0)
LYMPHS ABS: 1.8 10*3/uL (ref 1.0–3.6)
Lymphocytes Relative: 25 %
MCH: 32.1 pg (ref 26.0–34.0)
MCHC: 34.5 g/dL (ref 32.0–36.0)
MCV: 93.1 fL (ref 80.0–100.0)
MONOS PCT: 8 %
Monocytes Absolute: 0.6 10*3/uL (ref 0.2–0.9)
NEUTROS ABS: 4.9 10*3/uL (ref 1.4–6.5)
NEUTROS PCT: 66 %
Platelets: 255 10*3/uL (ref 150–440)
RBC: 3.37 MIL/uL — ABNORMAL LOW (ref 3.80–5.20)
RDW: 15.1 % — ABNORMAL HIGH (ref 11.5–14.5)
WBC: 7.4 10*3/uL (ref 3.6–11.0)

## 2016-10-16 LAB — BASIC METABOLIC PANEL
ANION GAP: 8 (ref 5–15)
BUN: 36 mg/dL — ABNORMAL HIGH (ref 6–20)
CALCIUM: 8.5 mg/dL — AB (ref 8.9–10.3)
CO2: 29 mmol/L (ref 22–32)
Chloride: 94 mmol/L — ABNORMAL LOW (ref 101–111)
Creatinine, Ser: 0.96 mg/dL (ref 0.44–1.00)
GFR calc Af Amer: >60 mL/min (ref >60)
GFR, EST NON AFRICAN AMERICAN: 54 mL/min — AB (ref >60)
GLUCOSE: 123 mg/dL — AB (ref 65–99)
Potassium: 4.8 mmol/L (ref 3.5–5.1)
Sodium: 131 mmol/L — ABNORMAL LOW (ref 135–145)

## 2016-10-16 LAB — GLUCOSE, CAPILLARY
Glucose-Capillary: 171 mg/dL — ABNORMAL HIGH (ref 65–99)
Glucose-Capillary: 309 mg/dL — ABNORMAL HIGH (ref 65–99)
Glucose-Capillary: 375 mg/dL — ABNORMAL HIGH (ref 65–99)
Glucose-Capillary: 226 mg/dL — ABNORMAL HIGH (ref 65–99)

## 2016-10-16 LAB — LIPID PANEL
Cholesterol: 224 mg/dL — ABNORMAL HIGH (ref 0–200)
HDL: 62 mg/dL (ref >40)
LDL CALC: 140 mg/dL — AB (ref 0–99)
TRIGLYCERIDES: 108 mg/dL (ref <150)
Total CHOL/HDL Ratio: 3.6 RATIO
VLDL: 22 mg/dL (ref 0–40)

## 2016-10-17 LAB — GLUCOSE, CAPILLARY
GLUCOSE-CAPILLARY: 244 mg/dL — AB (ref 65–99)
GLUCOSE-CAPILLARY: 107 mg/dL — AB (ref 65–99)
Glucose-Capillary: 268 mg/dL — ABNORMAL HIGH (ref 65–99)
Glucose-Capillary: 335 mg/dL — ABNORMAL HIGH (ref 65–99)

## 2016-10-18 LAB — URINALYSIS, COMPLETE (UACMP) WITH MICROSCOPIC
BILIRUBIN URINE: NEGATIVE
Glucose, UA: >=500 mg/dL — AB
Hgb urine dipstick: NEGATIVE
Ketones, ur: 5 mg/dL — AB
Nitrite: NEGATIVE
PROTEIN: NEGATIVE mg/dL
SPECIFIC GRAVITY, URINE: 1.007 (ref 1.005–1.030)
Squamous Epithelial / LPF: NONE SEEN
pH: 5 (ref 5.0–8.0)

## 2016-10-18 LAB — GLUCOSE, CAPILLARY
GLUCOSE-CAPILLARY: 220 mg/dL — AB (ref 65–99)
GLUCOSE-CAPILLARY: 198 mg/dL — AB (ref 65–99)
Glucose-Capillary: 318 mg/dL — ABNORMAL HIGH (ref 65–99)

## 2016-10-19 LAB — GLUCOSE, CAPILLARY
GLUCOSE-CAPILLARY: 82 mg/dL (ref 65–99)
GLUCOSE-CAPILLARY: 235 mg/dL — AB (ref 65–99)
GLUCOSE-CAPILLARY: 313 mg/dL — AB (ref 65–99)
GLUCOSE-CAPILLARY: 71 mg/dL (ref 65–99)
Glucose-Capillary: 277 mg/dL — ABNORMAL HIGH (ref 65–99)

## 2016-10-20 ENCOUNTER — Non-Acute Institutional Stay (SKILLED_NURSING_FACILITY): Payer: Medicare Other | Admitting: Gerontology

## 2016-10-20 DIAGNOSIS — Z48812 Encounter for surgical aftercare following surgery on the circulatory system: Secondary | ICD-10-CM

## 2016-10-20 DIAGNOSIS — N390 Urinary tract infection, site not specified: Secondary | ICD-10-CM | POA: Diagnosis not present

## 2016-10-20 LAB — GLUCOSE, CAPILLARY
GLUCOSE-CAPILLARY: 183 mg/dL — AB (ref 65–99)
Glucose-Capillary: 154 mg/dL — ABNORMAL HIGH (ref 65–99)
Glucose-Capillary: 350 mg/dL — ABNORMAL HIGH (ref 65–99)
Glucose-Capillary: 209 mg/dL — ABNORMAL HIGH (ref 65–99)

## 2016-10-21 LAB — URINE CULTURE: Culture: >=100000 — AB

## 2016-10-21 LAB — GLUCOSE, CAPILLARY
GLUCOSE-CAPILLARY: 199 mg/dL — AB (ref 65–99)
GLUCOSE-CAPILLARY: 254 mg/dL — AB (ref 65–99)
GLUCOSE-CAPILLARY: 205 mg/dL — AB (ref 65–99)
GLUCOSE-CAPILLARY: 194 mg/dL — AB (ref 65–99)

## 2016-10-22 LAB — GLUCOSE, CAPILLARY
GLUCOSE-CAPILLARY: 262 mg/dL — AB (ref 65–99)
Glucose-Capillary: 178 mg/dL — ABNORMAL HIGH (ref 65–99)
Glucose-Capillary: 180 mg/dL — ABNORMAL HIGH (ref 65–99)

## 2016-10-23 ENCOUNTER — Ambulatory Visit: Payer: Medicare Other

## 2016-10-23 LAB — GLUCOSE, CAPILLARY
GLUCOSE-CAPILLARY: 329 mg/dL — AB (ref 65–99)
GLUCOSE-CAPILLARY: 198 mg/dL — AB (ref 65–99)
GLUCOSE-CAPILLARY: 138 mg/dL — AB (ref 65–99)
Glucose-Capillary: 45 mg/dL — ABNORMAL LOW (ref 65–99)
Glucose-Capillary: 261 mg/dL — ABNORMAL HIGH (ref 65–99)

## 2016-10-24 ENCOUNTER — Encounter: Payer: Self-pay | Admitting: Internal Medicine

## 2016-10-24 LAB — GLUCOSE, CAPILLARY
GLUCOSE-CAPILLARY: 394 mg/dL — AB (ref 65–99)
GLUCOSE-CAPILLARY: 334 mg/dL — AB (ref 65–99)
Glucose-Capillary: 127 mg/dL — ABNORMAL HIGH (ref 65–99)
Glucose-Capillary: 209 mg/dL — ABNORMAL HIGH (ref 65–99)

## 2016-10-25 LAB — GLUCOSE, CAPILLARY
GLUCOSE-CAPILLARY: 110 mg/dL — AB (ref 65–99)
GLUCOSE-CAPILLARY: 470 mg/dL — AB (ref 65–99)
Glucose-Capillary: 377 mg/dL — ABNORMAL HIGH (ref 65–99)
Glucose-Capillary: 375 mg/dL — ABNORMAL HIGH (ref 65–99)
Glucose-Capillary: 144 mg/dL — ABNORMAL HIGH (ref 65–99)
Glucose-Capillary: 158 mg/dL — ABNORMAL HIGH (ref 65–99)

## 2016-10-26 LAB — GLUCOSE, CAPILLARY
GLUCOSE-CAPILLARY: 359 mg/dL — AB (ref 65–99)
GLUCOSE-CAPILLARY: 128 mg/dL — AB (ref 65–99)
GLUCOSE-CAPILLARY: 69 mg/dL (ref 65–99)
Glucose-Capillary: 163 mg/dL — ABNORMAL HIGH (ref 65–99)
Glucose-Capillary: 183 mg/dL — ABNORMAL HIGH (ref 65–99)

## 2016-10-27 LAB — GLUCOSE, CAPILLARY
GLUCOSE-CAPILLARY: 95 mg/dL (ref 65–99)
Glucose-Capillary: 203 mg/dL — ABNORMAL HIGH (ref 65–99)
Glucose-Capillary: 198 mg/dL — ABNORMAL HIGH (ref 65–99)

## 2016-10-28 ENCOUNTER — Telehealth: Payer: Self-pay | Admitting: Family Medicine

## 2016-10-28 ENCOUNTER — Non-Acute Institutional Stay (SKILLED_NURSING_FACILITY): Payer: Medicare Other | Admitting: Gerontology

## 2016-10-28 DIAGNOSIS — Z48812 Encounter for surgical aftercare following surgery on the circulatory system: Secondary | ICD-10-CM | POA: Insufficient documentation

## 2016-10-28 DIAGNOSIS — N390 Urinary tract infection, site not specified: Secondary | ICD-10-CM

## 2016-10-28 LAB — GLUCOSE, CAPILLARY
GLUCOSE-CAPILLARY: 85 mg/dL (ref 65–99)
GLUCOSE-CAPILLARY: 144 mg/dL — AB (ref 65–99)
Glucose-Capillary: 157 mg/dL — ABNORMAL HIGH (ref 65–99)

## 2016-10-28 NOTE — Telephone Encounter (Signed)
Cathy called [facility] stating that you had the pt on an antibx to help prevent UIT's and the pt is coming home from the nursing center and is wondering what the antibx was so she could restart that??

## 2016-10-28 NOTE — Progress Notes (Signed)
Location:      Place of Service:  SNF (31) Provider:  Toni Arthurs, NP-C  [name], MD  Patient Care Team: Susy Frizzle, MD as PCP - General (Family Medicine)  Extended Emergency Contact Information Primary Emergency Contact: [name] [place] Phone: [phone] Mobile Phone: [phone] Relation: Daughter Secondary Emergency Contact: [name] [place] Phone: [phone] Work Phone: [phone] Mobile Phone: [phone] Relation: Daughter  Code Status:  DNR Goals of care: Advanced Directive information Advanced Directives 09/28/2016  Does Patient Have a Medical Advance Directive? Yes  Type of Paramedic of [place];Living will  Does patient want to make changes to medical advance directive? No - Patient declined  Copy of [place] in Chart? No - copy requested  Would patient like information on creating a medical advance directive? -     Chief Complaint  Patient presents with  . Discharge Note    HPI:  Pt is a 82 y.o. female seen today for discharge evaluation visit for altered mental status resulting from a UTI and s/p pacemaker insertion. Pt began showing signs of confusion, refusing treatments and urinary retention. Nursing also noticed a slight abdominal bulge in the lower portion of the abdomen. Nursing collected urine sample and sent it to the lab for evaluation. UA was positive for Enterococcus Faecalis. Monurol initiated. Pt seems confused during assessment. Unable to answer questions appropriately, lethargic. Afebrile. Pt denies n/v/d/f/c/cp/sob/ha/abd pain/ dizziness. Pt has now completed treatment for the UTI. Pt is still lethargic at times and falls asleep easily. Now she is oriented, no confusion noted; back to baseline. No signs of bleeding from surgical site. No pain/ tenderness. No other complaints. Pt reports she is feeling "fine." Pt denies  n/v/d/f/c/cp/sob/ha/abd pain/dizziness. VSS. Pt and daughter feel she is ready for discharge.   Past Medical History:  Diagnosis Date  . Arthritis   . Asthma   . COPD (chronic obstructive pulmonary disease) ([place])   . Coronary atherosclerosis   . Dyslipidemia   . Gallstones   . History of fractured kneecap   . Hypothyroidism   . Interstitial cystitis   . Malaise and fatigue   . Memory loss   . Polymyalgia rheumatica ([place])   . Systemic hypertension   . Type II or unspecified type diabetes mellitus without mention of complication, not stated as uncontrolled   . UTI (lower urinary tract infection)    Past Surgical History:  Procedure Laterality Date  . CARDIAC CATHETERIZATION  01/29/2006   10% luminal irregularity mid LAD  . CARDIAC CATHETERIZATION  03/15/2001   No significant CAD, no evidence of renal artery stenosis, systemic hypertenion  . CARDIAC CATHETERIZATION N/A 08/21/2016   Procedure: Temporary Pacemaker;  Surgeon: Jettie Booze, MD;  Location: [facility];  Service: Cardiovascular;  Laterality: N/A;  . CARDIAC CATHETERIZATION N/A 09/28/2016   Procedure: Temporary Pacemaker;  Surgeon: Burnell Blanks, MD;  Location: [facility];  Service: Cardiovascular;  Laterality: N/A;  . CERVICAL SPINE SURGERY    . CHOLECYSTECTOMY    . EP IMPLANTABLE DEVICE N/A 09/29/2016   Procedure: Pacemaker Implant;  Surgeon: Evans Lance, MD;  Location: [facility];  Service: Cardiovascular;  Laterality: N/A;  . HERNIA REPAIR    . LUMBAR DISC SURGERY    . NM MYOCAR PERF WALL MOTION  08/17/2009   normal  . ROTATOR CUFF REPAIR    . TONSILLECTOMY    . TOTAL  ABDOMINAL HYSTERECTOMY    . US ECHOCARDIOGRAPHY  09/14/2007   mild LVH, LA mildly dilated,mild mitral annular calcification, AOV mildly sclerotic, aortic root sclerosis/ca+    No Known Allergies  Allergies as of 10/28/2016   No Known Allergies     Medication List       Accurate as of 10/28/16 10:06 PM.  Always use your most recent med list.          acetaminophen 325 MG tablet Commonly known as:  TYLENOL Take 2 tablets (650 mg total) by mouth every 6 (six) hours as needed for mild pain (or Fever >/= 101).   albuterol 108 (90 Base) MCG/ACT inhaler Commonly known as:  PROVENTIL HFA;VENTOLIN HFA Inhale 1-2 puffs into the lungs every 4 (four) hours as needed for wheezing or shortness of breath.   amLODipine 2.5 MG tablet Commonly known as:  NORVASC Take 1 tablet (2.5 mg total) by mouth daily.   aspirin 81 MG tablet Take 81 mg by mouth daily.   CALCIUM 1200 PO Take 1 tablet by mouth daily.   carvedilol 6.25 MG tablet Commonly known as:  COREG Take 1 tablet (6.25 mg total) by mouth 2 (two) times daily with a meal.   cyanocobalamin 100 MCG tablet Take 1 tablet (100 mcg total) by mouth daily.   diphenhydramine-acetaminophen 25-500 MG Tabs tablet Commonly known as:  TYLENOL PM Take 2 tablets by mouth at bedtime.   feeding supplement (PRO-STAT SUGAR FREE 64) Liqd Take 30 mLs by mouth 2 (two) times daily.   fentaNYL 25 MCG/HR patch Commonly known as:  DURAGESIC - dosed mcg/hr Place 25 mcg onto the skin every 3 (three) days.   ferrous sulfate 325 (65 FE) MG tablet Take 325 mg by mouth daily with breakfast.   GLUCERNA Liqd Take 237 mLs by mouth 2 (two) times daily between meals.   HYDROcodone-acetaminophen 5-325 MG tablet Commonly known as:  NORCO/VICODIN Take 1 tablet by mouth every 6 (six) hours as needed for moderate pain.   insulin lispro 100 UNIT/ML injection Commonly known as:  HUMALOG Inject 0-0.06 mLs (0-6 Units total) into the skin 3 (three) times daily as needed for high blood sugar. Patient uses sliding scale   Insulin Syringe-Needle U-100 31G X 5/16" 1 ML Misc For injection of insulin QID   lactulose 10 GM/15ML solution Commonly known as:  CHRONULAC Take 45 mLs (30 g total) by mouth daily as needed for mild constipation.   levothyroxine 100 MCG  tablet Commonly known as:  SYNTHROID, LEVOTHROID TAKE 1 TABLET (100 MCG TOTAL) BY MOUTH DAILY.   meclizine 12.5 MG tablet Commonly known as:  ANTIVERT Take 1 tablet (12.5 mg total) by mouth daily as needed for dizziness (vertigo).   OCUVITE PRESERVISION PO Take 1 capsule by mouth daily.   ONE TOUCH ULTRA TEST test strip Generic drug:  glucose blood TEST BS 5-6 TIMES A DAY   predniSONE 5 MG tablet Commonly known as:  DELTASONE Take 5 mg by mouth daily.   psyllium 95 % Pack Commonly known as:  HYDROCIL/METAMUCIL Take 1 packet by mouth 3 (three) times daily.   tamsulosin 0.4 MG Caps capsule Commonly known as:  FLOMAX Take 1 capsule (0.4 mg total) by mouth daily after breakfast.   tiotropium 18 MCG inhalation capsule Commonly known as:  SPIRIVA HANDIHALER Place 1 capsule (18 mcg total) into inhaler and inhale daily.   traMADol 50 MG tablet Commonly known as:  ULTRAM Take 50 mg by mouth daily as needed for  moderate pain.   UNABLE TO FIND Take 1 tablet by mouth daily. Med Name: probiotic capsule   Takes one daily.   Vitamin D3 2000 units Tabs Take 2,000 Units by mouth daily.       Review of Systems  Constitutional: Negative for activity change, appetite change, chills, diaphoresis, fatigue and fever.  HENT: Negative for congestion, sneezing, sore throat, trouble swallowing and voice change.   Eyes: Negative.   Respiratory: Negative for cough, choking, chest tightness, shortness of breath and wheezing.   Cardiovascular: Negative for chest pain and palpitations.  Gastrointestinal: Negative for abdominal distention, abdominal pain, constipation, diarrhea and nausea.  Endocrine: Negative.   Genitourinary: Negative for difficulty urinating, dysuria, frequency and urgency.  Musculoskeletal: Negative for back pain, gait problem and myalgias. Arthralgias: typical arthritis.  Skin: Negative for color change, pallor, rash and wound.  Allergic/Immunologic: Negative.    Neurological: Negative for dizziness, tremors, syncope, speech difficulty, weakness, numbness and headaches.  Psychiatric/Behavioral: Negative for agitation, behavioral problems and confusion.  All other systems reviewed and are negative.   Immunization History  Administered Date(s) Administered  . Influenza Split 08/22/2011, 08/10/2012, 08/10/2013, 08/10/2014  . Influenza Whole 08/10/2010  . Influenza,inj,Quad PF,36+ Mos 07/24/2016  . Pneumococcal Polysaccharide-23 02/21/2010   Pertinent  Health Maintenance Due  Topic Date Due  . FOOT EXAM  10/12/1942  . OPHTHALMOLOGY EXAM  10/12/1942  . URINE MICROALBUMIN  10/12/1942  . DEXA SCAN  10/13/1999  . PNA vac Low Risk Adult (2 of 2 - PCV13) 02/22/2011  . HEMOGLOBIN A1C  02/23/2017  . INFLUENZA VACCINE  Completed   Fall Risk  09/05/2016  Falls in the past year? No   Functional Status Survey:    Vitals:   10/28/16 0600  BP: (!) 120/43  Pulse: 60  Resp: 16  Temp: 98.2 F (36.8 C)  SpO2: 95%   There is no height or weight on file to calculate BMI. Physical Exam  Constitutional: She is oriented to person, place, and time. Vital signs are normal. She appears well-developed and well-nourished. She is active and cooperative. She does not appear ill. No distress.  HENT:  Head: Normocephalic and atraumatic.  Mouth/Throat: Uvula is midline, oropharynx is clear and moist and mucous membranes are normal. Mucous membranes are not pale, not dry and not cyanotic.  Eyes: Conjunctivae, EOM and lids are normal. Pupils are equal, round, and reactive to light.  Neck: Trachea normal, normal range of motion and full passive range of motion without pain. Neck supple. No JVD present. No tracheal deviation, no edema and no erythema present. No thyromegaly present.  Cardiovascular: Normal rate, regular rhythm, normal heart sounds, intact distal pulses and normal pulses.  Exam reveals no gallop, no distant heart sounds and no friction rub.   No murmur  heard. Pulmonary/Chest: Effort normal and breath sounds normal. No accessory muscle usage. No respiratory distress. She has no wheezes. She has no rales. She exhibits no tenderness.  Abdominal: Soft. Normal appearance and bowel sounds are normal. She exhibits no distension and no ascites. There is no tenderness.  Musculoskeletal: Normal range of motion. She exhibits no edema or tenderness.  Expected osteoarthritis, stiffness  Neurological: She is alert and oriented to person, place, and time. She has normal strength.  Skin: Skin is warm, dry and intact. No rash noted. She is not diaphoretic. No cyanosis or erythema. No pallor. Nails show no clubbing.  Psychiatric: Her speech is normal. Judgment and thought content normal. Her affect is blunt. She is  slowed. Cognition and memory are impaired. She exhibits abnormal recent memory.  Nursing note and vitals reviewed.   Labs reviewed:  Recent Labs  03/12/16 1147  09/29/16 0211  10/02/16 0258 10/03/16 0421 10/16/16 0515  NA 135  < > 134*  < > 130* 133* 131*  K 4.0  < > 5.0  < > 3.3* 3.7 4.8  CL 107  < > 102  < > 100* 101 94*  CO2 18*  < > 22  < > 22 23 29   GLUCOSE 352*  < > 90  < > 68 71 123*  BUN 23*  < > 43*  < > 21* 20 36*  CREATININE 1.38*  < > 2.31*  < > 0.93 0.83 0.96  CALCIUM 8.1*  < > 8.8*  < > 7.6* 8.3* 8.5*  MG  --   --  2.0  --   --   --   --   PHOS 1.8*  --  4.4  --   --   --   --   < > = values in this interval not displayed.  Recent Labs  09/28/16 1551 09/29/16 1953 10/01/16 0450  AST 1,969* 867* 360*  ALT 1,118* 991* 304*  ALKPHOS 115 128* 91  BILITOT 0.4 0.4 0.6  PROT 6.3* 6.6 5.0*  ALBUMIN 3.6 3.5 2.6*    Recent Labs  09/05/16 1515 09/28/16 1551 09/29/16 0211 10/01/16 0450 10/16/16 0515  WBC 7.3 15.6* 16.0* 10.3 7.4  NEUTROABS 4,380 13.4*  --   --  4.9  HGB 10.7* 9.4* 9.5* 9.1* 10.8*  HCT 32.7* 27.4* 28.0* 26.9* 31.4*  MCV 94.8 92.3 91.2 90.9 93.1  PLT 277 354 304 235 255   Lab Results  Component  Value Date   TSH 3.413 09/29/2016   Lab Results  Component Value Date   HGBA1C 6.4 (H) 08/25/2016   Lab Results  Component Value Date   CHOL 224 (H) 10/16/2016   HDL 62 10/16/2016   LDLCALC 140 (H) 10/16/2016   TRIG 108 10/16/2016   CHOLHDL 3.6 10/16/2016    Significant Diagnostic Results in last 30 days:  Dg Abd 1 View  Result Date: 10/01/2016 CLINICAL DATA:  Constipation and diarrhea. EXAM: ABDOMEN - 1 VIEW COMPARISON:  10/26/2015. FINDINGS: Cardiac pacer with lead tips projected the right atrium and right ventricle. Surgical clips right upper quadrant. Prominent amount stool noted throughout the colon. No bowel distention. No free air. Lumbar spine scoliosis and degenerative change. Degenerative changes both hips. Visceral atherosclerotic vascular calcification. IMPRESSION: 1. Prominent amount of stool noted throughout colon suggesting constipation. No prominent bowel distention. 2. Scoliosis and degenerative change lumbar spine. Degenerative changes both hips. 3. Atherosclerotic vascular disease. Electronically Signed   By: Marcello Moores  Register   On: 10/01/2016 12:21   Dg Chest Port 1 View  Result Date: 10/01/2016 CLINICAL DATA:  Followup respiratory failure EXAM: PORTABLE CHEST 1 VIEW COMPARISON:  Portable chest x-ray of 09/30/2016 FINDINGS: No active infiltrate or effusion is seen. Mild pulmonary vascular congestion has improved. Cardiomegaly is stable. Newly permanent pacemaker remains. A lower anterior cervical spine fusion plate is present. IMPRESSION: Improved aeration with resolution of pulmonary vascular congestion. Stable cardiomegaly with permanent pacemaker. Electronically Signed   By: Ivar Drape M.D.   On: 10/01/2016 08:05   Dg Chest Port 1 View  Result Date: 09/30/2016 CLINICAL DATA:  Bradycardia, history asthma, COPD, type II diabetes mellitus, hypertension, coronary artery disease EXAM: PORTABLE CHEST 1 VIEW COMPARISON:  Portable exam [phone]  hours compared to 09/29/2016  FINDINGS: LEFT subclavian transvenous pacemaker leads project over RIGHT atrium and RIGHT ventricle. Enlargement of cardiac silhouette. Calcified tortuous thoracic aorta. Mild pulmonary vascular congestion. Bronchitic changes with minimal RIGHT basilar atelectasis. Lungs otherwise clear without infiltrate, pleural effusion, or pneumothorax. Osseous demineralization. Prior cervicothoracic fusion. IMPRESSION: Enlargement of cardiac silhouette with pulmonary vascular congestion. Bronchitic changes with mild RIGHT basilar atelectasis. Electronically Signed   By: Lavonia Dana M.D.   On: 09/30/2016 07:54   Dg Chest Port 1 View  Result Date: 09/29/2016 CLINICAL DATA:  Postoperative radiograph, status post pacemaker placement. Initial encounter. EXAM: PORTABLE CHEST 1 VIEW COMPARISON:  Chest radiograph performed earlier today at 3:59 a.m. FINDINGS: The lungs are well-aerated and clear. There is no evidence of focal opacification, pleural effusion or pneumothorax. The cardiomediastinal silhouette is mildly enlarged. A pacemaker is noted overlying the left chest wall, with leads ending overlying the right atrium and right ventricle. No acute osseous abnormalities are seen. Clips are noted within the right upper quadrant, reflecting prior cholecystectomy. Cervical spinal fusion hardware is partially imaged. IMPRESSION: Mild cardiomegaly.  Lungs remain grossly clear. Electronically Signed   By: Garald Balding M.D.   On: 09/29/2016 20:34   Dg Chest Port 1 View  Result Date: 09/29/2016 CLINICAL DATA:  Respiratory failure. EXAM: PORTABLE CHEST 1 VIEW COMPARISON:  09/28/2016. FINDINGS: Cardiomegaly with normal pulmonary vascularity. Medial right base atelectasis and or infiltrate. No pleural effusion or pneumothorax. Cardiomegaly. No pneumothorax. Prior cervical spine fusion. IMPRESSION: 1.  Medial right base atelectasis and or infiltrate. 2. Stable cardiomegaly. Electronically Signed   By: Marcello Moores  Register   On:  09/29/2016 06:53   US Abdomen Limited Ruq  Result Date: 09/29/2016 CLINICAL DATA:  Acute onset of elevated LFTs. Nausea and vomiting. Initial encounter. EXAM: US ABDOMEN LIMITED - RIGHT UPPER QUADRANT COMPARISON:  None. FINDINGS: Gallbladder: Status post cholecystectomy.  No retained stones seen. Common bile duct: Diameter: 0.5 cm, within normal limits in caliber. Liver: No focal lesion identified. Within normal limits in parenchymal echogenicity. IMPRESSION: Unremarkable ultrasound of the right upper quadrant. Status post cholecystectomy. Electronically Signed   By: Garald Balding M.D.   On: 09/29/2016 21:44    Assessment/Plan 1. Urinary tract infection without hematuria, site unspecified  resolved  2. Aftercare following surgery of the circulatory system  Encourage PO fluid intake  Continue close monitoring of surgical site  Continue PT/OT services for deconditioning  Continue exercises taught by PT/OT  HHPT/OT at home after discharge  Follow up with PCP asap  Family/ staff Communication:   Total Time:  Documentation:  Face to Face:  Family/Phone:  Patient is being discharged with the following home health services:  [facility]/Sn through [facility]  Patient is being discharged with the following durable medical equipment:  none  Patient has been advised to f/u with their PCP in 1-2 weeks to bring them up to date on their rehab stay.  Social services at facility was responsible for arranging this appointment.  Pt was provided with a 30 day supply of prescriptions for medications and refills must be obtained from their PCP.  For controlled substances, a more limited supply may be provided adequate until PCP appointment only.   Labs/tests ordered:  None- per pcp  Medication list reviewed and assessed for continued appropriateness.  Vikki Ports, NP-C Geriatrics [facility] [phone], [place]  [address] Cell Phone (Mon-Fri 8am-5pm):  [phone] On Call:  [phone] &  follow prompts after 5pm & weekends Office Phone:  [phone] Office Fax:  [phone]

## 2016-10-28 NOTE — Telephone Encounter (Signed)
Kathy aware via vm and will call back for RX if needed

## 2016-10-28 NOTE — Progress Notes (Signed)
Location:      Place of Service:  SNF (31) Provider:  Toni Arthurs, NP-C  [name], MD  Patient Care Team: Susy Frizzle, MD as PCP - General (Family Medicine)  Extended Emergency Contact Information Primary Emergency Contact: [name] [place] of [place] Phone: [phone] Mobile Phone: [phone] Relation: Daughter Secondary Emergency Contact: [name] States of [place] Phone: [phone] Work Phone: [phone] Mobile Phone: [phone] Relation: Daughter  Code Status:  DNR Goals of care: Advanced Directive information Advanced Directives 09/28/2016  Does Patient Have a Medical Advance Directive? Yes  Type of Paramedic of [place];Living will  Does patient want to make changes to medical advance directive? No - Patient declined  Copy of [place] in Chart? No - copy requested  Would patient like information on creating a medical advance directive? -     Chief Complaint  Patient presents with  . Acute Visit    HPI:  Pt is a 82 y.o. female seen today for an acute visit for altered mental status resulting from a UTI. Pt began showing signs of confusion, refusing treatments and urinary retention. Nursing also noticed a slight abdominal bulge in the lower portion of the abdomen. Nursing collected urine sample and sent it to the lab for evaluation. UA was positive for Enterococcus Faecalis. Monurol initiated. Pt seems confused during assessment. Unable to answer questions appropriately, lethargic. Afebrile. Pt denies n/v/d/f/c/cp/sob/ha/abd pain/ dizziness. No signs of bleeding from surgical site. No pain/ tenderness. No other complaints. VSS. Please note, unable to obtain complete ROS d/t increased confusion.    Past Medical History:  Diagnosis Date  . Arthritis   . Asthma   . COPD (chronic obstructive pulmonary disease) ([place])   . Coronary atherosclerosis   . Dyslipidemia    . Gallstones   . History of fractured kneecap   . Hypothyroidism   . Interstitial cystitis   . Malaise and fatigue   . Memory loss   . Polymyalgia rheumatica ([place])   . Systemic hypertension   . Type II or unspecified type diabetes mellitus without mention of complication, not stated as uncontrolled   . UTI (lower urinary tract infection)    Past Surgical History:  Procedure Laterality Date  . CARDIAC CATHETERIZATION  01/29/2006   10% luminal irregularity mid LAD  . CARDIAC CATHETERIZATION  03/15/2001   No significant CAD, no evidence of renal artery stenosis, systemic hypertenion  . CARDIAC CATHETERIZATION N/A 08/21/2016   Procedure: Temporary Pacemaker;  Surgeon: Jettie Booze, MD;  Location: [facility];  Service: Cardiovascular;  Laterality: N/A;  . CARDIAC CATHETERIZATION N/A 09/28/2016   Procedure: Temporary Pacemaker;  Surgeon: Burnell Blanks, MD;  Location: [facility];  Service: Cardiovascular;  Laterality: N/A;  . CERVICAL SPINE SURGERY    . CHOLECYSTECTOMY    . EP IMPLANTABLE DEVICE N/A 09/29/2016   Procedure: Pacemaker Implant;  Surgeon: Evans Lance, MD;  Location: [facility];  Service: Cardiovascular;  Laterality: N/A;  . HERNIA REPAIR    . LUMBAR DISC SURGERY    . NM MYOCAR PERF WALL MOTION  08/17/2009   normal  . ROTATOR CUFF REPAIR    . TONSILLECTOMY    . TOTAL ABDOMINAL HYSTERECTOMY    . US ECHOCARDIOGRAPHY  09/14/2007   mild LVH, LA mildly dilated,mild mitral annular calcification, AOV mildly sclerotic, aortic root sclerosis/ca+    No Known Allergies  Allergies as of 10/20/2016   No  Known Allergies     Medication List       Accurate as of 10/20/16 11:59 PM. Always use your most recent med list.          acetaminophen 325 MG tablet Commonly known as:  TYLENOL Take 2 tablets (650 mg total) by mouth every 6 (six) hours as needed for mild pain (or Fever >/= 101).   albuterol 108 (90 Base) MCG/ACT inhaler Commonly  known as:  PROVENTIL HFA;VENTOLIN HFA Inhale 1-2 puffs into the lungs every 4 (four) hours as needed for wheezing or shortness of breath.   amLODipine 2.5 MG tablet Commonly known as:  NORVASC Take 1 tablet (2.5 mg total) by mouth daily.   aspirin 81 MG tablet Take 81 mg by mouth daily.   CALCIUM 1200 PO Take 1 tablet by mouth daily.   carvedilol 6.25 MG tablet Commonly known as:  COREG Take 1 tablet (6.25 mg total) by mouth 2 (two) times daily with a meal.   cyanocobalamin 100 MCG tablet Take 1 tablet (100 mcg total) by mouth daily.   diphenhydramine-acetaminophen 25-500 MG Tabs tablet Commonly known as:  TYLENOL PM Take 2 tablets by mouth at bedtime.   feeding supplement (PRO-STAT SUGAR FREE 64) Liqd Take 30 mLs by mouth 2 (two) times daily.   fentaNYL 25 MCG/HR patch Commonly known as:  DURAGESIC - dosed mcg/hr Place 25 mcg onto the skin every 3 (three) days.   ferrous sulfate 325 (65 FE) MG tablet Take 325 mg by mouth daily with breakfast.   GLUCERNA Liqd Take 237 mLs by mouth 2 (two) times daily between meals.   HYDROcodone-acetaminophen 5-325 MG tablet Commonly known as:  NORCO/VICODIN Take 1 tablet by mouth every 6 (six) hours as needed for moderate pain.   insulin lispro 100 UNIT/ML injection Commonly known as:  HUMALOG Inject 0-0.06 mLs (0-6 Units total) into the skin 3 (three) times daily as needed for high blood sugar. Patient uses sliding scale   Insulin Syringe-Needle U-100 31G X 5/16" 1 ML Misc For injection of insulin QID   lactulose 10 GM/15ML solution Commonly known as:  CHRONULAC Take 45 mLs (30 g total) by mouth daily as needed for mild constipation.   levothyroxine 100 MCG tablet Commonly known as:  SYNTHROID, LEVOTHROID TAKE 1 TABLET (100 MCG TOTAL) BY MOUTH DAILY.   meclizine 12.5 MG tablet Commonly known as:  ANTIVERT Take 1 tablet (12.5 mg total) by mouth daily as needed for dizziness (vertigo).   OCUVITE PRESERVISION PO Take 1  capsule by mouth daily.   ONE TOUCH ULTRA TEST test strip Generic drug:  glucose blood TEST BS 5-6 TIMES A DAY   predniSONE 5 MG tablet Commonly known as:  DELTASONE Take 5 mg by mouth daily.   psyllium 95 % Pack Commonly known as:  HYDROCIL/METAMUCIL Take 1 packet by mouth 3 (three) times daily.   tamsulosin 0.4 MG Caps capsule Commonly known as:  FLOMAX Take 1 capsule (0.4 mg total) by mouth daily after breakfast.   tiotropium 18 MCG inhalation capsule Commonly known as:  SPIRIVA HANDIHALER Place 1 capsule (18 mcg total) into inhaler and inhale daily.   traMADol 50 MG tablet Commonly known as:  ULTRAM Take 50 mg by mouth daily as needed for moderate pain.   UNABLE TO FIND Take 1 tablet by mouth daily. Med Name: probiotic capsule   Takes one daily.   Vitamin D3 2000 units Tabs Take 2,000 Units by mouth daily.  Review of Systems  Unable to perform ROS: Mental status change  Constitutional: Positive for appetite change and fatigue. Negative for activity change, chills, diaphoresis and fever.  HENT: Negative for congestion, sneezing, sore throat, trouble swallowing and voice change.   Respiratory: Negative for cough, choking, chest tightness, shortness of breath and wheezing.   Cardiovascular: Negative for chest pain and palpitations.  Gastrointestinal: Negative for abdominal distention, abdominal pain, constipation, diarrhea and nausea.  Genitourinary: Negative for difficulty urinating, dysuria, frequency and urgency.  Musculoskeletal: Negative for back pain, gait problem and myalgias. Arthralgias: typical arthritis.  Skin: Negative for color change, pallor, rash and wound.  Neurological: Negative for dizziness, tremors, syncope, speech difficulty, weakness, numbness and headaches.  Psychiatric/Behavioral: Positive for confusion. Negative for agitation and behavioral problems.  All other systems reviewed and are negative.   Immunization History  Administered  Date(s) Administered  . Influenza Split 08/22/2011, 08/10/2012, 08/10/2013, 08/10/2014  . Influenza Whole 08/10/2010  . Influenza,inj,Quad PF,36+ Mos 07/24/2016  . Pneumococcal Polysaccharide-23 02/21/2010   Pertinent  Health Maintenance Due  Topic Date Due  . FOOT EXAM  10/12/1942  . OPHTHALMOLOGY EXAM  10/12/1942  . URINE MICROALBUMIN  10/12/1942  . DEXA SCAN  10/13/1999  . PNA vac Low Risk Adult (2 of 2 - PCV13) 02/22/2011  . HEMOGLOBIN A1C  02/23/2017  . INFLUENZA VACCINE  Completed   Fall Risk  09/05/2016  Falls in the past year? No   Functional Status Survey:    Vitals:   10/20/16 0545  BP: (!) 142/52  Pulse: 60  Resp: 18  Temp: 97.5 F (36.4 C)  SpO2: 96%  Weight: 119 lb 4.8 oz (54.1 kg)   Body mass index is 23.3 kg/m. Physical Exam  Constitutional: She is oriented to person, place, and time. Vital signs are normal. She appears well-developed and well-nourished. She is active and cooperative. She does not appear ill. No distress.  HENT:  Head: Normocephalic and atraumatic.  Mouth/Throat: Uvula is midline, oropharynx is clear and moist and mucous membranes are normal. Mucous membranes are not pale, not dry and not cyanotic.  Eyes: Conjunctivae, EOM and lids are normal. Pupils are equal, round, and reactive to light.  Neck: Trachea normal, normal range of motion and full passive range of motion without pain. Neck supple. No JVD present. No tracheal deviation, no edema and no erythema present. No thyromegaly present.  Cardiovascular: Normal rate, regular rhythm, normal heart sounds, intact distal pulses and normal pulses.  Exam reveals no gallop, no distant heart sounds and no friction rub.   No murmur heard. Pulmonary/Chest: Effort normal and breath sounds normal. No accessory muscle usage. No respiratory distress. She has no wheezes. She has no rales. She exhibits no tenderness.  Abdominal: Soft. Normal appearance and bowel sounds are normal. She exhibits no  distension and no ascites. There is no tenderness.  Musculoskeletal: Normal range of motion. She exhibits no edema or tenderness.  Expected osteoarthritis, stiffness  Neurological: She is alert and oriented to person, place, and time. She has normal strength.  Skin: Skin is warm, dry and intact. No rash noted. She is not diaphoretic. No cyanosis or erythema. No pallor. Nails show no clubbing.  Psychiatric: Her speech is normal. Judgment and thought content normal. Her affect is blunt. She is slowed. Cognition and memory are impaired. She exhibits abnormal recent memory.  Nursing note and vitals reviewed.   Labs reviewed:  Recent Labs  03/12/16 1147  09/29/16 0211  10/02/16 0258 10/03/16 0421 10/16/16 [id number]  NA 135  < > 134*  < > 130* 133* 131*  K 4.0  < > 5.0  < > 3.3* 3.7 4.8  CL 107  < > 102  < > 100* 101 94*  CO2 18*  < > 22  < > 22 23 29   GLUCOSE 352*  < > 90  < > 68 71 123*  BUN 23*  < > 43*  < > 21* 20 36*  CREATININE 1.38*  < > 2.31*  < > 0.93 0.83 0.96  CALCIUM 8.1*  < > 8.8*  < > 7.6* 8.3* 8.5*  MG  --   --  2.0  --   --   --   --   PHOS 1.8*  --  4.4  --   --   --   --   < > = values in this interval not displayed.  Recent Labs  09/28/16 1551 09/29/16 1953 10/01/16 0450  AST 1,969* 867* 360*  ALT 1,118* 991* 304*  ALKPHOS 115 128* 91  BILITOT 0.4 0.4 0.6  PROT 6.3* 6.6 5.0*  ALBUMIN 3.6 3.5 2.6*    Recent Labs  09/05/16 1515 09/28/16 1551 09/29/16 0211 10/01/16 0450 10/16/16 0515  WBC 7.3 15.6* 16.0* 10.3 7.4  NEUTROABS 4,380 13.4*  --   --  4.9  HGB 10.7* 9.4* 9.5* 9.1* 10.8*  HCT 32.7* 27.4* 28.0* 26.9* 31.4*  MCV 94.8 92.3 91.2 90.9 93.1  PLT 277 354 304 235 255   Lab Results  Component Value Date   TSH 3.413 09/29/2016   Lab Results  Component Value Date   HGBA1C 6.4 (H) 08/25/2016   Lab Results  Component Value Date   CHOL 224 (H) 10/16/2016   HDL 62 10/16/2016   LDLCALC 140 (H) 10/16/2016   TRIG 108 10/16/2016   CHOLHDL 3.6  10/16/2016    Significant Diagnostic Results in last 30 days:  Dg Abd 1 View  Result Date: 10/01/2016 CLINICAL DATA:  Constipation and diarrhea. EXAM: ABDOMEN - 1 VIEW COMPARISON:  10/26/2015. FINDINGS: Cardiac pacer with lead tips projected the right atrium and right ventricle. Surgical clips right upper quadrant. Prominent amount stool noted throughout the colon. No bowel distention. No free air. Lumbar spine scoliosis and degenerative change. Degenerative changes both hips. Visceral atherosclerotic vascular calcification. IMPRESSION: 1. Prominent amount of stool noted throughout colon suggesting constipation. No prominent bowel distention. 2. Scoliosis and degenerative change lumbar spine. Degenerative changes both hips. 3. Atherosclerotic vascular disease. Electronically Signed   By: Marcello Moores  Register   On: 10/01/2016 12:21   Dg Chest Port 1 View  Result Date: 10/01/2016 CLINICAL DATA:  Followup respiratory failure EXAM: PORTABLE CHEST 1 VIEW COMPARISON:  Portable chest x-ray of 09/30/2016 FINDINGS: No active infiltrate or effusion is seen. Mild pulmonary vascular congestion has improved. Cardiomegaly is stable. Newly permanent pacemaker remains. A lower anterior cervical spine fusion plate is present. IMPRESSION: Improved aeration with resolution of pulmonary vascular congestion. Stable cardiomegaly with permanent pacemaker. Electronically Signed   By: Ivar Drape M.D.   On: 10/01/2016 08:05   Dg Chest Port 1 View  Result Date: 09/30/2016 CLINICAL DATA:  Bradycardia, history asthma, COPD, type II diabetes mellitus, hypertension, coronary artery disease EXAM: PORTABLE CHEST 1 VIEW COMPARISON:  Portable exam 0556 hours compared to 09/29/2016 FINDINGS: LEFT subclavian transvenous pacemaker leads project over RIGHT atrium and RIGHT ventricle. Enlargement of cardiac silhouette. Calcified tortuous thoracic aorta. Mild pulmonary vascular congestion. Bronchitic changes with minimal RIGHT basilar  atelectasis. Lungs  otherwise clear without infiltrate, pleural effusion, or pneumothorax. Osseous demineralization. Prior cervicothoracic fusion. IMPRESSION: Enlargement of cardiac silhouette with pulmonary vascular congestion. Bronchitic changes with mild RIGHT basilar atelectasis. Electronically Signed   By: Lavonia Dana M.D.   On: 09/30/2016 07:54   Dg Chest Port 1 View  Result Date: 09/29/2016 CLINICAL DATA:  Postoperative radiograph, status post pacemaker placement. Initial encounter. EXAM: PORTABLE CHEST 1 VIEW COMPARISON:  Chest radiograph performed earlier today at 3:59 a.m. FINDINGS: The lungs are well-aerated and clear. There is no evidence of focal opacification, pleural effusion or pneumothorax. The cardiomediastinal silhouette is mildly enlarged. A pacemaker is noted overlying the left chest wall, with leads ending overlying the right atrium and right ventricle. No acute osseous abnormalities are seen. Clips are noted within the right upper quadrant, reflecting prior cholecystectomy. Cervical spinal fusion hardware is partially imaged. IMPRESSION: Mild cardiomegaly.  Lungs remain grossly clear. Electronically Signed   By: Garald Balding M.D.   On: 09/29/2016 20:34   Dg Chest Port 1 View  Result Date: 09/29/2016 CLINICAL DATA:  Respiratory failure. EXAM: PORTABLE CHEST 1 VIEW COMPARISON:  09/28/2016. FINDINGS: Cardiomegaly with normal pulmonary vascularity. Medial right base atelectasis and or infiltrate. No pleural effusion or pneumothorax. Cardiomegaly. No pneumothorax. Prior cervical spine fusion. IMPRESSION: 1.  Medial right base atelectasis and or infiltrate. 2. Stable cardiomegaly. Electronically Signed   By: Marcello Moores  Register   On: 09/29/2016 06:53   US Abdomen Limited Ruq  Result Date: 09/29/2016 CLINICAL DATA:  Acute onset of elevated LFTs. Nausea and vomiting. Initial encounter. EXAM: US ABDOMEN LIMITED - RIGHT UPPER QUADRANT COMPARISON:  None. FINDINGS: Gallbladder: Status post  cholecystectomy.  No retained stones seen. Common bile duct: Diameter: 0.5 cm, within normal limits in caliber. Liver: No focal lesion identified. Within normal limits in parenchymal echogenicity. IMPRESSION: Unremarkable ultrasound of the right upper quadrant. Status post cholecystectomy. Electronically Signed   By: Garald Balding M.D.   On: 09/29/2016 21:44    Assessment/Plan 1. Urinary tract infection without hematuria, site unspecified  Monurol 3 grams po EOD x 3 doses for Enterococcus Faecalis UTI  Encourage PO fluid intake  Insert foley urinary retention  Begin bladder training within 2 days  Remove foley the next morning and continue prn bladder scans  2. Aftercare following surgery of the circulatory system  Encourage PO fluid intake  Continue close monitoring of surgical site  Continue PT/OT services for deconditioning  Family/ staff Communication:   Total Time:  Documentation:  Face to Face:  Family/Phone:   Labs/tests ordered:  abdominal xrays (negative)  Medication list reviewed and assessed for continued appropriateness.  Vikki Ports, NP-C Geriatrics [facility] [phone], [place] [address] Cell Phone (Mon-Fri 8am-5pm):  [phone] On Call:  [phone] & follow prompts after 5pm & weekends Office Phone:  [phone] Office Fax:  [phone]

## 2016-10-28 NOTE — Telephone Encounter (Signed)
Michelle [name] called back and has rx with refills

## 2016-10-28 NOTE — Telephone Encounter (Signed)
ceftin 250 poqday

## 2016-10-29 LAB — GLUCOSE, CAPILLARY: GLUCOSE-CAPILLARY: 256 mg/dL — AB (ref 65–99)

## 2016-10-31 DIAGNOSIS — M5137 Other intervertebral disc degeneration, lumbosacral region: Secondary | ICD-10-CM | POA: Diagnosis not present

## 2016-10-31 DIAGNOSIS — M4696 Unspecified inflammatory spondylopathy, lumbar region: Secondary | ICD-10-CM | POA: Diagnosis not present

## 2016-10-31 DIAGNOSIS — Z79899 Other long term (current) drug therapy: Secondary | ICD-10-CM | POA: Diagnosis not present

## 2016-10-31 DIAGNOSIS — G894 Chronic pain syndrome: Secondary | ICD-10-CM | POA: Diagnosis not present

## 2016-10-31 DIAGNOSIS — M47817 Spondylosis without myelopathy or radiculopathy, lumbosacral region: Secondary | ICD-10-CM | POA: Diagnosis not present

## 2016-10-31 DIAGNOSIS — Z79891 Long term (current) use of opiate analgesic: Secondary | ICD-10-CM | POA: Diagnosis not present

## 2016-11-05 ENCOUNTER — Other Ambulatory Visit: Payer: Self-pay | Admitting: Family Medicine

## 2016-11-05 ENCOUNTER — Telehealth: Payer: Self-pay | Admitting: Family Medicine

## 2016-11-05 MED ORDER — GLUCAGON (RDNA) 1 MG IJ KIT: 1.0000 mg | PACK | Freq: Once | INTRAMUSCULAR | 12 refills | Status: AC | PRN

## 2016-11-05 NOTE — Telephone Encounter (Signed)
Glucagon 1 mg IM PRN hypoglycemia.  May repeat in 15 minutes if needed.

## 2016-11-05 NOTE — Telephone Encounter (Signed)
Ok to refill??      (and I do no have this on her list so if you ok it I need sig too please)

## 2016-11-05 NOTE — Telephone Encounter (Signed)
Medication called/sent to requested pharmacy and pt aware via vm 

## 2016-11-05 NOTE — Telephone Encounter (Signed)
Patients daughter calling to see if dr pickard can call in glucagon shots to [facility] [phone]

## 2016-11-06 DIAGNOSIS — E1165 Type 2 diabetes mellitus with hyperglycemia: Secondary | ICD-10-CM | POA: Diagnosis not present

## 2016-11-06 DIAGNOSIS — I2510 Atherosclerotic heart disease of native coronary artery without angina pectoris: Secondary | ICD-10-CM | POA: Diagnosis not present

## 2016-11-06 DIAGNOSIS — F0280 Dementia in other diseases classified elsewhere without behavioral disturbance: Secondary | ICD-10-CM | POA: Diagnosis not present

## 2016-11-06 DIAGNOSIS — I442 Atrioventricular block, complete: Secondary | ICD-10-CM | POA: Diagnosis not present

## 2016-11-06 DIAGNOSIS — Z48812 Encounter for surgical aftercare following surgery on the circulatory system: Secondary | ICD-10-CM | POA: Diagnosis not present

## 2016-11-06 DIAGNOSIS — G309 Alzheimer's disease, unspecified: Secondary | ICD-10-CM | POA: Diagnosis not present

## 2016-11-07 DIAGNOSIS — F0280 Dementia in other diseases classified elsewhere without behavioral disturbance: Secondary | ICD-10-CM | POA: Diagnosis not present

## 2016-11-07 DIAGNOSIS — E1165 Type 2 diabetes mellitus with hyperglycemia: Secondary | ICD-10-CM | POA: Diagnosis not present

## 2016-11-07 DIAGNOSIS — I2510 Atherosclerotic heart disease of native coronary artery without angina pectoris: Secondary | ICD-10-CM | POA: Diagnosis not present

## 2016-11-07 DIAGNOSIS — G309 Alzheimer's disease, unspecified: Secondary | ICD-10-CM | POA: Diagnosis not present

## 2016-11-07 DIAGNOSIS — I442 Atrioventricular block, complete: Secondary | ICD-10-CM | POA: Diagnosis not present

## 2016-11-07 DIAGNOSIS — Z48812 Encounter for surgical aftercare following surgery on the circulatory system: Secondary | ICD-10-CM | POA: Diagnosis not present

## 2016-11-11 ENCOUNTER — Telehealth: Payer: Self-pay | Admitting: Family Medicine

## 2016-11-11 ENCOUNTER — Other Ambulatory Visit: Payer: Medicare Other

## 2016-11-11 DIAGNOSIS — G309 Alzheimer's disease, unspecified: Secondary | ICD-10-CM | POA: Diagnosis not present

## 2016-11-11 DIAGNOSIS — Z9189 Other specified personal risk factors, not elsewhere classified: Secondary | ICD-10-CM | POA: Diagnosis not present

## 2016-11-11 DIAGNOSIS — R300 Dysuria: Secondary | ICD-10-CM

## 2016-11-11 DIAGNOSIS — I442 Atrioventricular block, complete: Secondary | ICD-10-CM | POA: Diagnosis not present

## 2016-11-11 DIAGNOSIS — E1165 Type 2 diabetes mellitus with hyperglycemia: Secondary | ICD-10-CM | POA: Diagnosis not present

## 2016-11-11 DIAGNOSIS — Z48812 Encounter for surgical aftercare following surgery on the circulatory system: Secondary | ICD-10-CM | POA: Diagnosis not present

## 2016-11-11 DIAGNOSIS — I2510 Atherosclerotic heart disease of native coronary artery without angina pectoris: Secondary | ICD-10-CM | POA: Diagnosis not present

## 2016-11-11 DIAGNOSIS — F0280 Dementia in other diseases classified elsewhere without behavioral disturbance: Secondary | ICD-10-CM | POA: Diagnosis not present

## 2016-11-11 MED ORDER — ONETOUCH ULTRA BLUE VI STRP: ORAL_STRIP | 3 refills | Status: AC

## 2016-11-11 NOTE — Telephone Encounter (Signed)
Patients daughter Maralyn Sago calling to say when her test strips were faxed to [facility], they did not have a "code" wants to know if we can refax this with a code.

## 2016-11-11 NOTE — Telephone Encounter (Signed)
Med resent to pharm with code

## 2016-11-11 NOTE — Telephone Encounter (Signed)
Patient's daughter Juliann Pulse came by with urine sample states that it has an odor and cloudy she would like [place] to check for a UTI and do a culture. If any medication needs to be called in please call into [facility] at [facility]  CB# [phone]

## 2016-11-12 LAB — URINALYSIS, ROUTINE W REFLEX MICROSCOPIC
Bilirubin Urine: NEGATIVE
Glucose, UA: NEGATIVE
Hgb urine dipstick: NEGATIVE
Ketones, ur: NEGATIVE
Nitrite: NEGATIVE
PH: 5.5 (ref 5.0–8.0)
Specific Gravity, Urine: 1.014 (ref 1.001–1.035)

## 2016-11-12 LAB — URINALYSIS, MICROSCOPIC ONLY
Bacteria, UA: NONE SEEN [HPF]
Casts: NONE SEEN [LPF]
Crystals: NONE SEEN [HPF]
RBC / HPF: NONE SEEN RBC/HPF (ref <=2)
Squamous Epithelial / LPF: NONE SEEN [HPF] (ref <=5)
WBC, UA: >60 WBC/HPF — AB (ref <=5)

## 2016-11-13 DIAGNOSIS — I2510 Atherosclerotic heart disease of native coronary artery without angina pectoris: Secondary | ICD-10-CM | POA: Diagnosis not present

## 2016-11-13 DIAGNOSIS — Z48812 Encounter for surgical aftercare following surgery on the circulatory system: Secondary | ICD-10-CM | POA: Diagnosis not present

## 2016-11-13 DIAGNOSIS — F0280 Dementia in other diseases classified elsewhere without behavioral disturbance: Secondary | ICD-10-CM | POA: Diagnosis not present

## 2016-11-13 DIAGNOSIS — I442 Atrioventricular block, complete: Secondary | ICD-10-CM | POA: Diagnosis not present

## 2016-11-13 DIAGNOSIS — E1165 Type 2 diabetes mellitus with hyperglycemia: Secondary | ICD-10-CM | POA: Diagnosis not present

## 2016-11-13 DIAGNOSIS — G309 Alzheimer's disease, unspecified: Secondary | ICD-10-CM | POA: Diagnosis not present

## 2016-11-14 ENCOUNTER — Telehealth: Payer: Self-pay | Admitting: Family Medicine

## 2016-11-14 DIAGNOSIS — F0280 Dementia in other diseases classified elsewhere without behavioral disturbance: Secondary | ICD-10-CM | POA: Diagnosis not present

## 2016-11-14 DIAGNOSIS — G309 Alzheimer's disease, unspecified: Secondary | ICD-10-CM | POA: Diagnosis not present

## 2016-11-14 DIAGNOSIS — I442 Atrioventricular block, complete: Secondary | ICD-10-CM | POA: Diagnosis not present

## 2016-11-14 DIAGNOSIS — I2510 Atherosclerotic heart disease of native coronary artery without angina pectoris: Secondary | ICD-10-CM | POA: Diagnosis not present

## 2016-11-14 DIAGNOSIS — Z48812 Encounter for surgical aftercare following surgery on the circulatory system: Secondary | ICD-10-CM | POA: Diagnosis not present

## 2016-11-14 DIAGNOSIS — E1165 Type 2 diabetes mellitus with hyperglycemia: Secondary | ICD-10-CM | POA: Diagnosis not present

## 2016-11-14 LAB — URINE CULTURE: Colony Count: >=100000

## 2016-11-14 MED ORDER — AMOXICILLIN 875 MG PO TABS: 875.0000 mg | ORAL_TABLET | Freq: Two times a day (BID) | ORAL | 0 refills | Status: DC

## 2016-11-14 NOTE — Telephone Encounter (Signed)
Patient daughter LMOVM requesting results of urine.   Please contact at [phone]~ telephone.

## 2016-11-14 NOTE — Telephone Encounter (Signed)
Ms wyrick patients daughter calling to ask some questions about her bp running high and what bp med should she maybe start taking  [phone]

## 2016-11-14 NOTE — Telephone Encounter (Signed)
Tried to call no anwser and no vm

## 2016-11-14 NOTE — Telephone Encounter (Signed)
Pt's daughter aware and med called to pharm

## 2016-11-14 NOTE — Telephone Encounter (Signed)
Enterococcus uti- amox 875 bid for 5 days

## 2016-11-14 NOTE — Telephone Encounter (Signed)
Spoke to Michelle [name] and she states that her BP as been running in the 150's and wondering if she should start the BP med again. Informed her to treat the UTI and see if her BP gets better cause if pt feels bad or is in pain it can cause BP to elevate some. She agreed and will call back in a week or 2 to let us know how she is doing.

## 2016-11-17 DIAGNOSIS — F0280 Dementia in other diseases classified elsewhere without behavioral disturbance: Secondary | ICD-10-CM | POA: Diagnosis not present

## 2016-11-17 DIAGNOSIS — G309 Alzheimer's disease, unspecified: Secondary | ICD-10-CM | POA: Diagnosis not present

## 2016-11-17 DIAGNOSIS — E1165 Type 2 diabetes mellitus with hyperglycemia: Secondary | ICD-10-CM | POA: Diagnosis not present

## 2016-11-17 DIAGNOSIS — I442 Atrioventricular block, complete: Secondary | ICD-10-CM | POA: Diagnosis not present

## 2016-11-17 DIAGNOSIS — I2510 Atherosclerotic heart disease of native coronary artery without angina pectoris: Secondary | ICD-10-CM | POA: Diagnosis not present

## 2016-11-17 DIAGNOSIS — Z48812 Encounter for surgical aftercare following surgery on the circulatory system: Secondary | ICD-10-CM | POA: Diagnosis not present

## 2016-11-19 DIAGNOSIS — I442 Atrioventricular block, complete: Secondary | ICD-10-CM | POA: Diagnosis not present

## 2016-11-19 DIAGNOSIS — F0280 Dementia in other diseases classified elsewhere without behavioral disturbance: Secondary | ICD-10-CM | POA: Diagnosis not present

## 2016-11-19 DIAGNOSIS — I2510 Atherosclerotic heart disease of native coronary artery without angina pectoris: Secondary | ICD-10-CM | POA: Diagnosis not present

## 2016-11-19 DIAGNOSIS — G309 Alzheimer's disease, unspecified: Secondary | ICD-10-CM | POA: Diagnosis not present

## 2016-11-19 DIAGNOSIS — Z48812 Encounter for surgical aftercare following surgery on the circulatory system: Secondary | ICD-10-CM | POA: Diagnosis not present

## 2016-11-19 DIAGNOSIS — E1165 Type 2 diabetes mellitus with hyperglycemia: Secondary | ICD-10-CM | POA: Diagnosis not present

## 2016-11-20 ENCOUNTER — Telehealth: Payer: Self-pay | Admitting: Internal Medicine

## 2016-11-20 DIAGNOSIS — Z48812 Encounter for surgical aftercare following surgery on the circulatory system: Secondary | ICD-10-CM | POA: Diagnosis not present

## 2016-11-20 DIAGNOSIS — E1165 Type 2 diabetes mellitus with hyperglycemia: Secondary | ICD-10-CM | POA: Diagnosis not present

## 2016-11-20 DIAGNOSIS — I442 Atrioventricular block, complete: Secondary | ICD-10-CM | POA: Diagnosis not present

## 2016-11-20 DIAGNOSIS — F0280 Dementia in other diseases classified elsewhere without behavioral disturbance: Secondary | ICD-10-CM | POA: Diagnosis not present

## 2016-11-20 DIAGNOSIS — I2510 Atherosclerotic heart disease of native coronary artery without angina pectoris: Secondary | ICD-10-CM | POA: Diagnosis not present

## 2016-11-20 DIAGNOSIS — G309 Alzheimer's disease, unspecified: Secondary | ICD-10-CM | POA: Diagnosis not present

## 2016-11-20 NOTE — Telephone Encounter (Signed)
New Message    *STAT* If patient is at the pharmacy, call can be transferred to refill team.   1. Which medications need to be refilled? (please list name of each medication and dose if known)   carvedilol (COREG) 6.25 MG tablet  2. Which pharmacy/location (including street and city if local pharmacy) is medication to be sent to? [facility] [address] [place] 3. Do they need a 30 day or 90 day supply? 90 day

## 2016-11-21 ENCOUNTER — Other Ambulatory Visit: Payer: Self-pay | Admitting: *Deleted

## 2016-11-21 DIAGNOSIS — G309 Alzheimer's disease, unspecified: Secondary | ICD-10-CM | POA: Diagnosis not present

## 2016-11-21 DIAGNOSIS — E1165 Type 2 diabetes mellitus with hyperglycemia: Secondary | ICD-10-CM | POA: Diagnosis not present

## 2016-11-21 DIAGNOSIS — Z48812 Encounter for surgical aftercare following surgery on the circulatory system: Secondary | ICD-10-CM | POA: Diagnosis not present

## 2016-11-21 DIAGNOSIS — F0280 Dementia in other diseases classified elsewhere without behavioral disturbance: Secondary | ICD-10-CM | POA: Diagnosis not present

## 2016-11-21 DIAGNOSIS — I2510 Atherosclerotic heart disease of native coronary artery without angina pectoris: Secondary | ICD-10-CM | POA: Diagnosis not present

## 2016-11-21 DIAGNOSIS — I442 Atrioventricular block, complete: Secondary | ICD-10-CM | POA: Diagnosis not present

## 2016-11-21 MED ORDER — CARVEDILOL 6.25 MG PO TABS: 6.2500 mg | ORAL_TABLET | Freq: Two times a day (BID) | ORAL | 0 refills | Status: DC

## 2016-11-25 ENCOUNTER — Telehealth: Payer: Self-pay | Admitting: Family Medicine

## 2016-11-25 DIAGNOSIS — E1165 Type 2 diabetes mellitus with hyperglycemia: Secondary | ICD-10-CM | POA: Diagnosis not present

## 2016-11-25 DIAGNOSIS — I2510 Atherosclerotic heart disease of native coronary artery without angina pectoris: Secondary | ICD-10-CM | POA: Diagnosis not present

## 2016-11-25 DIAGNOSIS — I442 Atrioventricular block, complete: Secondary | ICD-10-CM | POA: Diagnosis not present

## 2016-11-25 DIAGNOSIS — F0280 Dementia in other diseases classified elsewhere without behavioral disturbance: Secondary | ICD-10-CM | POA: Diagnosis not present

## 2016-11-25 DIAGNOSIS — Z48812 Encounter for surgical aftercare following surgery on the circulatory system: Secondary | ICD-10-CM | POA: Diagnosis not present

## 2016-11-25 DIAGNOSIS — G309 Alzheimer's disease, unspecified: Secondary | ICD-10-CM | POA: Diagnosis not present

## 2016-11-25 NOTE — Telephone Encounter (Signed)
Michelle [name] called LMOVM stating that her mothers BP's have been staying up and would like to know if she should be on a BP medication daily instead of prn? BP's are 197/83,191/94,184/97,190/86. After giving her losartan it did come down to 181/72

## 2016-11-27 ENCOUNTER — Emergency Department (HOSPITAL_COMMUNITY): Payer: Medicare Other

## 2016-11-27 ENCOUNTER — Inpatient Hospital Stay (HOSPITAL_COMMUNITY)
Admission: EM | Admit: 2016-11-27 | Discharge: 2016-12-02 | DRG: 178 | Disposition: A | Discharge status: Skilled Nursing Facility | Payer: Medicare Other | Attending: Internal Medicine | Admitting: Internal Medicine

## 2016-11-27 ENCOUNTER — Encounter (HOSPITAL_COMMUNITY): Payer: Self-pay | Admitting: Emergency Medicine

## 2016-11-27 DIAGNOSIS — D649 Anemia, unspecified: Secondary | ICD-10-CM | POA: Diagnosis present

## 2016-11-27 DIAGNOSIS — I110 Hypertensive heart disease with heart failure: Secondary | ICD-10-CM | POA: Diagnosis present

## 2016-11-27 DIAGNOSIS — R112 Nausea with vomiting, unspecified: Secondary | ICD-10-CM

## 2016-11-27 DIAGNOSIS — J449 Chronic obstructive pulmonary disease, unspecified: Secondary | ICD-10-CM

## 2016-11-27 DIAGNOSIS — Z950 Presence of cardiac pacemaker: Secondary | ICD-10-CM

## 2016-11-27 DIAGNOSIS — N10 Acute tubulo-interstitial nephritis: Secondary | ICD-10-CM | POA: Diagnosis present

## 2016-11-27 DIAGNOSIS — E860 Dehydration: Secondary | ICD-10-CM | POA: Diagnosis present

## 2016-11-27 DIAGNOSIS — M353 Polymyalgia rheumatica: Secondary | ICD-10-CM

## 2016-11-27 DIAGNOSIS — R4182 Altered mental status, unspecified: Secondary | ICD-10-CM

## 2016-11-27 DIAGNOSIS — Z7982 Long term (current) use of aspirin: Secondary | ICD-10-CM

## 2016-11-27 DIAGNOSIS — A419 Sepsis, unspecified organism: Secondary | ICD-10-CM | POA: Diagnosis not present

## 2016-11-27 DIAGNOSIS — J690 Pneumonitis due to inhalation of food and vomit: Principal | ICD-10-CM | POA: Diagnosis present

## 2016-11-27 DIAGNOSIS — M549 Dorsalgia, unspecified: Secondary | ICD-10-CM | POA: Diagnosis present

## 2016-11-27 DIAGNOSIS — R1310 Dysphagia, unspecified: Secondary | ICD-10-CM | POA: Diagnosis present

## 2016-11-27 DIAGNOSIS — F0280 Dementia in other diseases classified elsewhere without behavioral disturbance: Secondary | ICD-10-CM | POA: Diagnosis present

## 2016-11-27 DIAGNOSIS — R1110 Vomiting, unspecified: Secondary | ICD-10-CM | POA: Diagnosis not present

## 2016-11-27 DIAGNOSIS — T380X5A Adverse effect of glucocorticoids and synthetic analogues, initial encounter: Secondary | ICD-10-CM | POA: Diagnosis present

## 2016-11-27 DIAGNOSIS — R197 Diarrhea, unspecified: Secondary | ICD-10-CM

## 2016-11-27 DIAGNOSIS — E039 Hypothyroidism, unspecified: Secondary | ICD-10-CM | POA: Diagnosis present

## 2016-11-27 DIAGNOSIS — E785 Hyperlipidemia, unspecified: Secondary | ICD-10-CM | POA: Diagnosis present

## 2016-11-27 DIAGNOSIS — J441 Chronic obstructive pulmonary disease with (acute) exacerbation: Secondary | ICD-10-CM | POA: Diagnosis present

## 2016-11-27 DIAGNOSIS — Z66 Do not resuscitate: Secondary | ICD-10-CM | POA: Diagnosis present

## 2016-11-27 DIAGNOSIS — E1165 Type 2 diabetes mellitus with hyperglycemia: Secondary | ICD-10-CM | POA: Diagnosis present

## 2016-11-27 DIAGNOSIS — I959 Hypotension, unspecified: Secondary | ICD-10-CM | POA: Diagnosis not present

## 2016-11-27 DIAGNOSIS — G8929 Other chronic pain: Secondary | ICD-10-CM | POA: Diagnosis present

## 2016-11-27 DIAGNOSIS — Z79899 Other long term (current) drug therapy: Secondary | ICD-10-CM

## 2016-11-27 DIAGNOSIS — E871 Hypo-osmolality and hyponatremia: Secondary | ICD-10-CM | POA: Diagnosis present

## 2016-11-27 DIAGNOSIS — G309 Alzheimer's disease, unspecified: Secondary | ICD-10-CM | POA: Diagnosis present

## 2016-11-27 DIAGNOSIS — I10 Essential (primary) hypertension: Secondary | ICD-10-CM | POA: Diagnosis present

## 2016-11-27 DIAGNOSIS — I5022 Chronic systolic (congestive) heart failure: Secondary | ICD-10-CM | POA: Diagnosis present

## 2016-11-27 DIAGNOSIS — N390 Urinary tract infection, site not specified: Secondary | ICD-10-CM | POA: Diagnosis present

## 2016-11-27 DIAGNOSIS — Z7952 Long term (current) use of systemic steroids: Secondary | ICD-10-CM

## 2016-11-27 DIAGNOSIS — E1121 Type 2 diabetes mellitus with diabetic nephropathy: Secondary | ICD-10-CM

## 2016-11-27 DIAGNOSIS — Z794 Long term (current) use of insulin: Secondary | ICD-10-CM

## 2016-11-27 LAB — URINALYSIS, ROUTINE W REFLEX MICROSCOPIC
BILIRUBIN URINE: NEGATIVE
Glucose, UA: 500 mg/dL — AB
Ketones, ur: 40 mg/dL — AB
NITRITE: NEGATIVE
Protein, ur: 100 mg/dL — AB
SPECIFIC GRAVITY, URINE: 1.015 (ref 1.005–1.030)
pH: 6 (ref 5.0–8.0)

## 2016-11-27 LAB — COMPREHENSIVE METABOLIC PANEL
ALBUMIN: 3.6 g/dL (ref 3.5–5.0)
ALT: 15 U/L (ref 14–54)
AST: 36 U/L (ref 15–41)
Alkaline Phosphatase: 62 U/L (ref 38–126)
Anion gap: 18 — ABNORMAL HIGH (ref 5–15)
BUN: 33 mg/dL — AB (ref 6–20)
CHLORIDE: 96 mmol/L — AB (ref 101–111)
CO2: 20 mmol/L — AB (ref 22–32)
Calcium: 9.3 mg/dL (ref 8.9–10.3)
Creatinine, Ser: 1.18 mg/dL — ABNORMAL HIGH (ref 0.44–1.00)
GFR calc Af Amer: 48 mL/min — ABNORMAL LOW (ref >60)
GFR calc non Af Amer: 42 mL/min — ABNORMAL LOW (ref >60)
GLUCOSE: 292 mg/dL — AB (ref 65–99)
POTASSIUM: 4.7 mmol/L (ref 3.5–5.1)
SODIUM: 134 mmol/L — AB (ref 135–145)
Total Bilirubin: 1.1 mg/dL (ref 0.3–1.2)
Total Protein: 6.1 g/dL — ABNORMAL LOW (ref 6.5–8.1)

## 2016-11-27 LAB — CBC WITH DIFFERENTIAL/PLATELET
BASOS PCT: 0 %
Basophils Absolute: 0 10*3/uL (ref 0.0–0.1)
EOS ABS: 0 10*3/uL (ref 0.0–0.7)
EOS PCT: 0 %
HCT: 32.3 % — ABNORMAL LOW (ref 36.0–46.0)
Hemoglobin: 10.8 g/dL — ABNORMAL LOW (ref 12.0–15.0)
Lymphocytes Relative: 7 %
Lymphs Abs: 0.9 10*3/uL (ref 0.7–4.0)
MCH: 31.9 pg (ref 26.0–34.0)
MCHC: 33.4 g/dL (ref 30.0–36.0)
MCV: 95.3 fL (ref 78.0–100.0)
MONO ABS: 0.8 10*3/uL (ref 0.1–1.0)
Monocytes Relative: 6 %
Neutro Abs: 11.5 10*3/uL — ABNORMAL HIGH (ref 1.7–7.7)
Neutrophils Relative %: 87 %
PLATELETS: 244 10*3/uL (ref 150–400)
RBC: 3.39 MIL/uL — ABNORMAL LOW (ref 3.87–5.11)
RDW: 14 % (ref 11.5–15.5)
WBC: 13.2 10*3/uL — AB (ref 4.0–10.5)

## 2016-11-27 LAB — LIPASE, BLOOD: LIPASE: 11 U/L (ref 11–51)

## 2016-11-27 LAB — URINALYSIS, MICROSCOPIC (REFLEX): BACTERIA UA: NONE SEEN

## 2016-11-27 LAB — I-STAT CG4 LACTIC ACID, ED
LACTIC ACID, VENOUS: 2.7 mmol/L — AB (ref 0.5–1.9)
Lactic Acid, Venous: 2.15 mmol/L (ref 0.5–1.9)

## 2016-11-27 MED ORDER — IOPAMIDOL (ISOVUE-300) INJECTION 61%
INTRAVENOUS | Status: AC
  Administered 2016-11-27: 100 mL
  Filled 2016-11-27: qty 30

## 2016-11-27 MED ORDER — SODIUM CHLORIDE 0.9 % IV BOLUS (SEPSIS)
500.0000 mL | Freq: Once | INTRAVENOUS | Status: AC
  Administered 2016-11-27: 500 mL via INTRAVENOUS

## 2016-11-27 MED ORDER — FENTANYL CITRATE (PF) 100 MCG/2ML IJ SOLN
50.0000 ug | Freq: Once | INTRAMUSCULAR | Status: AC
  Administered 2016-11-27: 50 ug via INTRAVENOUS
  Filled 2016-11-27: qty 2

## 2016-11-27 MED ORDER — ONDANSETRON HCL 4 MG/2ML IJ SOLN
4.0000 mg | Freq: Once | INTRAMUSCULAR | Status: AC
  Administered 2016-11-27: 4 mg via INTRAVENOUS
  Filled 2016-11-27: qty 2

## 2016-11-27 MED ORDER — IOPAMIDOL (ISOVUE-300) INJECTION 61%
INTRAVENOUS | Status: AC
  Filled 2016-11-27: qty 100

## 2016-11-27 NOTE — ED Notes (Signed)
Daughter at bedside reports pt lives alone but has several family members nearby. Pt usually has a caregiver who checks in throughout the day but has not been able to today due to the weather. Daughter also reports that pt's water has been turned off due to a maintenance issue

## 2016-11-27 NOTE — ED Notes (Signed)
Pt attempting to drank second bottle of contrast; continues to vomit

## 2016-11-27 NOTE — ED Notes (Signed)
Linens changed; pt with brief saturated in urine and BM. Pericare completed for in and out cath.

## 2016-11-27 NOTE — ED Notes (Signed)
IV attempted x2 without success; second RN to attempt  

## 2016-11-27 NOTE — ED Notes (Signed)
Nurse getting all labs

## 2016-11-27 NOTE — ED Notes (Signed)
Pt initially placed on 2L [place] by EMS. Oxygen removed, sats remained in mid and upper 90s. Rales noted to R chest

## 2016-11-27 NOTE — ED Notes (Signed)
Pt unable to tolerate PO contrast; CT made aware

## 2016-11-27 NOTE — ED Notes (Signed)
Pt drank one bottle of contrast and began vomiting

## 2016-11-27 NOTE — ED Triage Notes (Signed)
Also mental status per family is "not normal"

## 2016-11-27 NOTE — ED Triage Notes (Signed)
Pt from home.  Per EMS pt with N/V/D, bile on face, clothing.  EMS states they also feel family is unable to care for her at home and should have social services consult.

## 2016-11-27 NOTE — ED Provider Notes (Signed)
[facility] DEPT Provider Note   CSN: [id number] Arrival date & time: 11/27/16  1831     History   Chief Complaint Chief Complaint  Patient presents with  . Nausea  . Emesis  . Diarrhea    HPI Michelle [name] is a 83 y.o. female with a hx of Pacemaker (09/29/2016 - [employer]ude dual-chamber pacemaker for symptomatic bradycardia due to complete heart block) Asthma, arthritis, COPD, coronary atherosclerosis, cholecystectomy, IDDM, hypothyroidism, hypertension presents to the Emergency Department via EMS without specific complaint. Per EMS family on scene requesting EMS reports several days of nausea vomiting and diarrhea. They report change in mental status and patient being unable to care for herself anymore. Patient reports she lives alone. She admits to "some vomiting" but is unable to quantify the amount, frequency or consistency. Her she is unable to answer most of my questions. She knows who she is and where she is is unaware the date.  Level V caveat for altered mental status.  The history is provided by the patient and medical records. No language interpreter was used.    Past Medical History:  Diagnosis Date  . Arthritis   . Asthma   . COPD (chronic obstructive pulmonary disease) ([place])   . Coronary atherosclerosis   . Dyslipidemia   . Gallstones   . History of fractured kneecap   . Hypothyroidism   . Interstitial cystitis   . Malaise and fatigue   . Memory loss   . Polymyalgia rheumatica ([place])   . Systemic hypertension   . Type II or unspecified type diabetes mellitus without mention of complication, not stated as uncontrolled   . UTI (lower urinary tract infection)     Patient Active Problem List   Diagnosis Date Noted  . Sepsis ([place]) 11/28/2016  . Aftercare following surgery of the circulatory system 10/28/2016  . Cardiogenic shock ([place])   . Elevated troponin   . Shock liver   . Bradycardia   . Dyspnea   . Hypoglycemia 08/21/2016  . Complete heart block  ([place]) 08/21/2016  . Hypertension 08/21/2016  . Hyperkalemia   . Alzheimer's dementia 04/23/2016  . Acute kidney injury ([place])   . Diabetes mellitus with complication ([place])   . HLD (hyperlipidemia) 06/13/2015  . Nausea & vomiting 06/12/2015  . CAD (coronary artery disease) 06/12/2015  . Acute renal failure superimposed on stage 3 chronic kidney disease ([place]) 06/12/2015  . Hypothyroidism   . Symptomatic sinus bradycardia 01/16/2014  . Abnormality of gait 11/24/2013  . Hip pain 11/24/2013  . Knee pain 11/24/2013  . Memory loss 05/23/2013  . HYPERLIPIDEMIA 01/08/2008  . Asthma with COPD ([place]) 01/06/2008  . Polymyalgia rheumatica ([place]) 01/06/2008  . DYSPNEA 12/17/2007    Past Surgical History:  Procedure Laterality Date  . CARDIAC CATHETERIZATION  01/29/2006   10% luminal irregularity mid LAD  . CARDIAC CATHETERIZATION  03/15/2001   No significant CAD, no evidence of renal artery stenosis, systemic hypertenion  . CARDIAC CATHETERIZATION N/A 08/21/2016   Procedure: Temporary Pacemaker;  Surgeon: Jettie Booze, MD;  Location: [facility];  Service: Cardiovascular;  Laterality: N/A;  . CARDIAC CATHETERIZATION N/A 09/28/2016   Procedure: Temporary Pacemaker;  Surgeon: Burnell Blanks, MD;  Location: [facility];  Service: Cardiovascular;  Laterality: N/A;  . CERVICAL SPINE SURGERY    . CHOLECYSTECTOMY    . EP IMPLANTABLE DEVICE N/A 09/29/2016   Procedure: Pacemaker Implant;  Surgeon: Evans Lance, MD;  Location: [facility];  Service: Cardiovascular;  Laterality: N/A;  . HERNIA REPAIR    . LUMBAR DISC SURGERY    . NM MYOCAR PERF WALL MOTION  08/17/2009   normal  . ROTATOR CUFF REPAIR    . TONSILLECTOMY    . TOTAL ABDOMINAL HYSTERECTOMY    . US ECHOCARDIOGRAPHY  09/14/2007   mild LVH, LA mildly dilated,mild mitral annular calcification, AOV mildly sclerotic, aortic root sclerosis/ca+    OB History    No data available       Home Medications     Prior to Admission medications   Medication Sig Start Date End Date Taking? Authorizing Provider  acetaminophen (TYLENOL) 325 MG tablet Take 2 tablets (650 mg total) by mouth every 6 (six) hours as needed for mild pain (or Fever >/= 101). 08/25/16  Yes Belkys A Regalado, MD  albuterol (PROVENTIL HFA;VENTOLIN HFA) 108 (90 Base) MCG/ACT inhaler Inhale 1-2 puffs into the lungs every 4 (four) hours as needed for wheezing or shortness of breath. 09/25/16  Yes Janne Napoleon, NP  amLODipine (NORVASC) 2.5 MG tablet Take 1 tablet (2.5 mg total) by mouth daily. 10/04/16  Yes Belkys A Regalado, MD  aspirin 81 MG tablet Take 81 mg by mouth daily.   Yes Historical Provider, MD  carvedilol (COREG) 6.25 MG tablet Take 6.25 mg by mouth 2 (two) times daily with a meal.   Yes Historical Provider, MD  diphenhydramine-acetaminophen (TYLENOL PM) 25-500 MG TABS tablet Take 2 tablets by mouth at bedtime.   Yes Historical Provider, MD  diphenhydramine-acetaminophen (TYLENOL PM) 25-500 MG TABS tablet Take 2 tablets by mouth at bedtime.   Yes Historical Provider, MD  fentaNYL (DURAGESIC - DOSED MCG/HR) 25 MCG/HR patch Place 25 mcg onto the skin every 3 (three) days.  05/26/16  Yes Historical Provider, MD  glucagon 1 MG injection Inject 1 mg into the vein once as needed. Patient taking differently: Inject 1 mg into the vein once as needed (hypoglycemia).  11/05/16  Yes Susy Frizzle, MD  HYDROcodone-acetaminophen (NORCO/VICODIN) 5-325 MG per tablet Take 1 tablet by mouth every 6 (six) hours as needed for moderate pain.    Yes Historical Provider, MD  insulin lispro (HUMALOG) 100 UNIT/ML injection Inject 0-0.06 mLs (0-6 Units total) into the skin 3 (three) times daily as needed for high blood sugar. Patient uses sliding scale 05/26/16  Yes Susy Frizzle, MD  insulin NPH Human (HUMULIN N,NOVOLIN N) 100 UNIT/ML injection Inject 5 Units into the skin 2 (two) times daily.   Yes Historical Provider, MD  Insulin Syringe-Needle  U-100 31G X 5/16" 1 ML MISC For injection of insulin QID 07/29/16  Yes Susy Frizzle, MD  lactulose (CHRONULAC) 10 GM/15ML solution Take 45 mLs (30 g total) by mouth daily as needed for mild constipation. 10/03/16  Yes Belkys A Regalado, MD  levothyroxine (SYNTHROID, LEVOTHROID) 100 MCG tablet TAKE 1 TABLET (100 MCG TOTAL) BY MOUTH DAILY. 01/28/16  Yes Susy Frizzle, MD  meclizine (ANTIVERT) 12.5 MG tablet Take 1 tablet (12.5 mg total) by mouth daily as needed for dizziness (vertigo). Patient taking differently: Take 12.5 mg by mouth every morning.  10/03/16  Yes Belkys A Regalado, MD  Nutritional Supplements (ENSURE PO) Take 1 Can by mouth daily as needed (meal replacment).   Yes Historical Provider, MD  ONE TOUCH ULTRA TEST test strip TEST BLOOD SUGAR 5-6 TIMES A DAY E11.9 11/11/16  Yes Susy Frizzle, MD  polyethylene glycol (MIRALAX / GLYCOLAX) packet Take 17 g by mouth  every morning.   Yes Historical Provider, MD  predniSONE (DELTASONE) 5 MG tablet Take 5 mg by mouth daily.  04/15/16  Yes Historical Provider, MD  psyllium (HYDROCIL/METAMUCIL) 95 % PACK Take 1 packet by mouth 3 (three) times daily.   Yes Historical Provider, MD  telmisartan (MICARDIS) 40 MG tablet Take 40 mg by mouth daily as needed (blood pressure over 180).   Yes Historical Provider, MD  tiotropium (SPIRIVA HANDIHALER) 18 MCG inhalation capsule Place 1 capsule (18 mcg total) into inhaler and inhale daily. 03/17/16 02/26/18 Yes Clinton D Young, MD  traMADol (ULTRAM) 50 MG tablet Take 50 mg by mouth daily as needed for moderate pain.   Yes Historical Provider, MD  UNABLE TO FIND Take 1 tablet by mouth daily. Med Name: probiotic capsule   Takes one daily.    Yes Historical Provider, MD  Calcium Carbonate-Vit D-Min (CALCIUM 1200 PO) Take 1 tablet by mouth daily.     Historical Provider, MD  Cholecalciferol (VITAMIN D3) 2000 units TABS Take 2,000 Units by mouth daily.     Historical Provider, MD  ferrous sulfate 325 (65 FE) MG tablet  Take 325 mg by mouth daily with breakfast.    Historical Provider, MD  Multiple Vitamins-Minerals (OCUVITE PRESERVISION PO) Take 1 capsule by mouth daily.    Historical Provider, MD  vitamin B-12 100 MCG tablet Take 1 tablet (100 mcg total) by mouth daily. 08/26/16   Elmarie Shiley, MD    Family History Family History  Problem Relation Age of Onset  . Breast cancer Mother   . Heart disease Father   . Lung cancer Brother     Social History Social History  Substance Use Topics  . Smoking status: Never Smoker  . Smokeless tobacco: Never Used  . Alcohol use No     Allergies   Patient has no known allergies.   Review of Systems Review of Systems  Unable to perform ROS: Mental status change  Gastrointestinal: Positive for diarrhea, nausea and vomiting.     Physical Exam Updated Vital Signs BP (!) 95/45   Pulse 85   Temp 99 F (37.2 C)   Resp 16   Wt 54.4 kg   SpO2 97%   BMI 23.44 kg/m   Physical Exam  Constitutional: She appears well-developed and well-nourished. No distress.  Awake, alert, nontoxic appearance  HENT:  Head: Normocephalic and atraumatic.  Mouth/Throat: Mucous membranes are dry. No oropharyngeal exudate.  Dried bilious emesis around patient's mouth  Eyes: Conjunctivae are normal. No scleral icterus.  Neck: Normal range of motion. Neck supple.  Cardiovascular: Normal rate, regular rhythm and intact distal pulses.   Pulmonary/Chest: Effort normal. No respiratory distress. She has rhonchi ( Throughout).  Equal chest expansion Congested cough Pacemaker in place  Abdominal: Soft. Bowel sounds are normal. She exhibits no mass. There is no tenderness. There is no rebound and no guarding.  Musculoskeletal: Normal range of motion.  Neurological: She is alert.  Speech is clear and coherent Moves extremities without ataxia  Skin: Skin is warm and dry. She is not diaphoretic.  Psychiatric: She has a normal mood and affect.  Nursing note and vitals  reviewed.    ED Treatments / Results  Labs (all labs ordered are listed, but only abnormal results are displayed) Labs Reviewed  CBC WITH DIFFERENTIAL/PLATELET - Abnormal; Notable for the following:       Result Value   WBC 13.2 (*)    RBC 3.39 (*)    Hemoglobin  10.8 (*)    HCT 32.3 (*)    Neutro Abs 11.5 (*)    All other components within normal limits  COMPREHENSIVE METABOLIC PANEL - Abnormal; Notable for the following:    Sodium 134 (*)    Chloride 96 (*)    CO2 20 (*)    Glucose, Bld 292 (*)    BUN 33 (*)    Creatinine, Ser 1.18 (*)    Total Protein 6.1 (*)    GFR calc non Af Amer 42 (*)    GFR calc Af Amer 48 (*)    Anion gap 18 (*)    All other components within normal limits  URINALYSIS, ROUTINE W REFLEX MICROSCOPIC - Abnormal; Notable for the following:    APPearance CLOUDY (*)    Glucose, UA 500 (*)    Hgb urine dipstick SMALL (*)    Ketones, ur 40 (*)    Protein, ur 100 (*)    Leukocytes, UA MODERATE (*)    All other components within normal limits  URINALYSIS, MICROSCOPIC (REFLEX) - Abnormal; Notable for the following:    Squamous Epithelial / LPF 0-5 (*)    All other components within normal limits  CBG MONITORING, ED - Abnormal; Notable for the following:    Glucose-Capillary 457 (*)    All other components within normal limits  I-STAT CG4 LACTIC ACID, ED - Abnormal; Notable for the following:    Lactic Acid, Venous 2.15 (*)    All other components within normal limits  I-STAT CG4 LACTIC ACID, ED - Abnormal; Notable for the following:    Lactic Acid, Venous 2.70 (*)    All other components within normal limits  CULTURE, BLOOD (ROUTINE X 2)  CULTURE, BLOOD (ROUTINE X 2)  URINE CULTURE  GASTROINTESTINAL PANEL BY PCR, STOOL (REPLACES STOOL CULTURE)  LIPASE, BLOOD  SALICYLATE LEVEL  INFLUENZA PANEL BY PCR (TYPE A & B)  HEMOGLOBIN A1C    EKG  EKG Interpretation  Date/Time:  Thursday November 27 2016 18:47:26 EST Ventricular Rate:  83 PR  Interval:    QRS Duration: 171 QT Interval:  462 QTC Calculation: 543 R Axis:   -73 Text Interpretation:  Sinus rhythm Prolonged PR interval Left bundle branch block LBBB is new Paced? Confirmed by Alvino Chapel  MD, Ovid Curd [phone]) on 11/27/2016 9:01:40 PM        Radiology Dg Chest 2 View  Result Date: 11/27/2016 CLINICAL DATA:  83 year old female with history of nausea, vomiting and diarrhea. EXAM: CHEST  2 VIEW COMPARISON:  Chest x-ray 10/01/2016. FINDINGS: Lung volumes are low. No consolidative airspace disease. No pleural effusions. No pneumothorax. No pulmonary nodule or mass noted. Pulmonary vasculature and the cardiomediastinal silhouette are within normal limits. Aortic atherosclerosis. Left-sided pacemaker device in place with lead tips projecting over the expected location of the right atrium and right ventricular apex. Orthopedic fixation hardware in the lower cervical spine incidentally noted. IMPRESSION: 1. Low lung volumes without radiographic evidence of acute cardiopulmonary disease. 2. Aortic atherosclerosis. Electronically Signed   By: Vinnie Langton M.D.   On: 11/27/2016 19:21   Ct Abdomen Pelvis W Contrast  Result Date: 11/28/2016 CLINICAL DATA:  Altered mental status vomiting EXAM: CT ABDOMEN AND PELVIS WITH CONTRAST TECHNIQUE: Multidetector CT imaging of the abdomen and pelvis was performed using the standard protocol following bolus administration of intravenous contrast. CONTRAST:  173mL ISOVUE-300 IOPAMIDOL (ISOVUE-300) INJECTION 61% COMPARISON:  None. FINDINGS: Lower chest: Lung bases demonstrate patchy dependent atelectasis at the left base. No pleural  effusion. Cardiomegaly is present. Partially visualized pacer leads. Dense mitral calcifications. Hepatobiliary: No focal hepatic abnormality. Surgical clips in the gallbladder fossa. No biliary dilatation Pancreas: Unremarkable. No pancreatic ductal dilatation or surrounding inflammatory changes. Spleen: Normal in size  without focal abnormality. Adrenals/Urinary Tract: Adrenal glands within normal limits. Kidneys show no hydronephrosis. The bladder is enlarged but otherwise unremarkable Stomach/Bowel: The stomach is nonenlarged. There is no dilated small bowel. No colon wall thickening. Appendix normal. Vascular/Lymphatic: Moderate atherosclerotic vascular calcifications of the aorta and branch vessels. No grossly enlarged lymph nodes Reproductive: Status post hysterectomy. No adnexal masses. Other: No free air or free fluid.  Diffuse subcutaneous edema. Musculoskeletal: Degenerative changes of the spine with minimal retrolisthesis of L3 on L4. Minimal anterolisthesis of L4 on L5. Prominent Schmorl's node at L4 superiorly. IMPRESSION: No CT evidence for a bowel obstruction or other acute intra-abdominal or pelvic pathology Electronically Signed   By: Donavan Foil M.D.   On: 11/28/2016 00:00    Procedures Procedures (including critical care time)  CRITICAL CARE Performed by: Abigail Butts Total critical care time: 45 minutes Critical care time was exclusive of separately billable procedures and treating other patients. Critical care was necessary to treat or prevent imminent or life-threatening deterioration. Critical care was time spent personally by me on the following activities: development of treatment plan with patient and/or surrogate as well as nursing, discussions with consultants, evaluation of patient's response to treatment, examination of patient, obtaining history from patient or surrogate, ordering and performing treatments and interventions, ordering and review of laboratory studies, ordering and review of radiographic studies, pulse oximetry and re-evaluation of patient's condition.   Medications Ordered in ED Medications  iopamidol (ISOVUE-300) 61 % injection (not administered)  insulin aspart (novoLOG) injection 0-5 Units (not administered)  insulin aspart (novoLOG) injection 0-9 Units  (not administered)  0.9 %  sodium chloride infusion ( Intravenous New Bag/Given 11/28/16 0118)  hydrocortisone sodium succinate (SOLU-CORTEF) 100 MG injection 50 mg (50 mg Intravenous Given 11/28/16 0150)  sodium chloride 0.9 % bolus 500 mL (0 mLs Intravenous Stopped 11/27/16 2153)  sodium chloride 0.9 % bolus 500 mL (0 mLs Intravenous Stopped 11/27/16 2308)  iopamidol (ISOVUE-300) 61 % injection (100 mLs  Contrast Given 11/27/16 2321)  ondansetron (ZOFRAN) injection 4 mg (4 mg Intravenous Given 11/27/16 2151)  fentaNYL (SUBLIMAZE) injection 50 mcg (50 mcg Intravenous Given 11/27/16 2216)  0.9 %  sodium chloride infusion ( Intravenous Stopped 11/28/16 0100)  piperacillin-tazobactam (ZOSYN) IVPB 3.375 g (0 g Intravenous Stopped 11/28/16 0141)  vancomycin (VANCOCIN) IVPB 1000 mg/200 mL premix (1,000 mg Intravenous New Bag/Given 11/28/16 0100)  sodium chloride 0.9 % bolus 500 mL (500 mLs Intravenous New Bag/Given 11/28/16 0100)  insulin aspart (novoLOG) injection 5 Units (5 Units Subcutaneous Given 11/28/16 0117)     Initial Impression / Assessment and Plan / ED Course  I have reviewed the triage vital signs and the nursing notes.  Pertinent labs & imaging results that were available during my care of the patient were reviewed by me and considered in my medical decision making (see chart for details).  Clinical Course as of Nov 28 199  Thu Nov 27, 2016  2058 Pt now with family at bedside. Daughter reports that patient has been vomiting since early this morning with copious amounts of yellow emesis and watery diarrhea. No hematemesis, melena or hematochezia. Daughter reports the patient has a history of Alzheimer's. Unknown if she took her medications today. She is currently being treated for UTI with  an unknown antibiotic.  [HM]  2140 Pt with persistent vomiting.    [HM]  Fri Nov 28, 2016  0033 Pt weight is 54kg.  Sepsis dose at 64mL/kg is a total of 1620 fluid.  She has currently received 1500 of fluid  and is receiving an additional 552mL.  Last BP was 90/70.    [HM]  0053 Improvement in BP after last 552mL bolus.  Lungs remain clear.    [HM]  [id number] Discussed with Dr. Loleta Books who will admit to stepdown.    [HM]  0158 Leukocytosis WBC: (!) 13.2 [HM]  0158 Likely UTI. Leukocytes, UA: (!) MODERATE [HM]  0158 Lactic acid continuing to climb. Lactic Acid, Venous: (!!) 2.70 [HM]  0159 Mild AKI Creatinine: (!) 1.18 [HM]  0159 Elevated anion gap Anion gap: (!) 18 [HM]  0159 Hyperglycemia Glucose: (!) 292 [HM]  0159 After fluids BP: (!) 95/45 [HM]  0200 No acute abdominal pathology CT ABDOMEN PELVIS W CONTRAST [HM]  0200 No evidence of pneumonia DG Chest 2 View [HM]    Clinical Course User Index [HM] Anup Brigham, PA-C   Sepsis - Repeat Assessment  Performed at:    2:00 AM   Vitals     Blood pressure (!) 79/67, pulse 64, temperature 99 F (37.2 C), resp. rate (!) 32, weight 54.4 kg, SpO2 98 %.  Heart:     Regular rate and rhythm  Lungs:    CTA  Capillary Refill:   <2 sec  Peripheral Pulse:   Radial pulse palpable  Skin:     Normal Color   Patient has received 30 mL/kg bolus. Additional fluids going at this time.  Patient presents with nausea vomiting and diarrhea. Elevated lactic acid in spite of fluid rehydration.  Patient has had increasing restlessness throughout the night. Unclear if this is sundowning versus increasing altered mental status she is now hypotensive.  Patient has been giving judicious but persistent fluid rehydration. Increasing lactic acid in spite of rehydration.  She's had continued vomiting anytime she attempts to drink. CT scan of the abdomen is without acute abnormality.   Discussed with Dr. Loleta Books who will plan to admit to stepdown but will assess for potential ICU admission. Patient has been afebrile throughout her time here. Broad-spectrum antibiotics begun.  The patient was discussed with and seen by Dr. Alvino Chapel who agrees with the treatment  plan.  Final Clinical Impressions(s) / ED Diagnoses   Final diagnoses:  Dehydration  Nausea vomiting and diarrhea  Altered mental status, unspecified altered mental status type  Sepsis, due to unspecified organism ([place])  Hypotension, unspecified hypotension type    New Prescriptions New Prescriptions   No medications on file     Abigail Butts, PA-C 11/28/16 0201    Davonna Belling, MD 11/28/16 2337

## 2016-11-28 ENCOUNTER — Encounter (HOSPITAL_COMMUNITY): Payer: Self-pay | Admitting: Family Medicine

## 2016-11-28 DIAGNOSIS — R6520 Severe sepsis without septic shock: Secondary | ICD-10-CM | POA: Diagnosis not present

## 2016-11-28 DIAGNOSIS — R1312 Dysphagia, oropharyngeal phase: Secondary | ICD-10-CM | POA: Diagnosis not present

## 2016-11-28 DIAGNOSIS — J441 Chronic obstructive pulmonary disease with (acute) exacerbation: Secondary | ICD-10-CM | POA: Diagnosis present

## 2016-11-28 DIAGNOSIS — M549 Dorsalgia, unspecified: Secondary | ICD-10-CM | POA: Diagnosis present

## 2016-11-28 DIAGNOSIS — R1310 Dysphagia, unspecified: Secondary | ICD-10-CM | POA: Diagnosis present

## 2016-11-28 DIAGNOSIS — R4182 Altered mental status, unspecified: Secondary | ICD-10-CM

## 2016-11-28 DIAGNOSIS — Z66 Do not resuscitate: Secondary | ICD-10-CM | POA: Diagnosis present

## 2016-11-28 DIAGNOSIS — I110 Hypertensive heart disease with heart failure: Secondary | ICD-10-CM | POA: Diagnosis present

## 2016-11-28 DIAGNOSIS — Z794 Long term (current) use of insulin: Secondary | ICD-10-CM

## 2016-11-28 DIAGNOSIS — J449 Chronic obstructive pulmonary disease, unspecified: Secondary | ICD-10-CM | POA: Diagnosis not present

## 2016-11-28 DIAGNOSIS — D649 Anemia, unspecified: Secondary | ICD-10-CM | POA: Diagnosis present

## 2016-11-28 DIAGNOSIS — Z79899 Other long term (current) drug therapy: Secondary | ICD-10-CM | POA: Diagnosis not present

## 2016-11-28 DIAGNOSIS — R531 Weakness: Secondary | ICD-10-CM | POA: Diagnosis not present

## 2016-11-28 DIAGNOSIS — E860 Dehydration: Secondary | ICD-10-CM | POA: Diagnosis present

## 2016-11-28 DIAGNOSIS — F0280 Dementia in other diseases classified elsewhere without behavioral disturbance: Secondary | ICD-10-CM | POA: Diagnosis present

## 2016-11-28 DIAGNOSIS — M353 Polymyalgia rheumatica: Secondary | ICD-10-CM | POA: Diagnosis present

## 2016-11-28 DIAGNOSIS — F0281 Dementia in other diseases classified elsewhere with behavioral disturbance: Secondary | ICD-10-CM

## 2016-11-28 DIAGNOSIS — N10 Acute tubulo-interstitial nephritis: Secondary | ICD-10-CM

## 2016-11-28 DIAGNOSIS — Z950 Presence of cardiac pacemaker: Secondary | ICD-10-CM | POA: Diagnosis not present

## 2016-11-28 DIAGNOSIS — T380X5A Adverse effect of glucocorticoids and synthetic analogues, initial encounter: Secondary | ICD-10-CM | POA: Diagnosis present

## 2016-11-28 DIAGNOSIS — I10 Essential (primary) hypertension: Secondary | ICD-10-CM | POA: Diagnosis not present

## 2016-11-28 DIAGNOSIS — A419 Sepsis, unspecified organism: Secondary | ICD-10-CM | POA: Diagnosis present

## 2016-11-28 DIAGNOSIS — E1165 Type 2 diabetes mellitus with hyperglycemia: Secondary | ICD-10-CM | POA: Diagnosis present

## 2016-11-28 DIAGNOSIS — G301 Alzheimer's disease with late onset: Secondary | ICD-10-CM | POA: Diagnosis not present

## 2016-11-28 DIAGNOSIS — M6281 Muscle weakness (generalized): Secondary | ICD-10-CM | POA: Diagnosis not present

## 2016-11-28 DIAGNOSIS — R488 Other symbolic dysfunctions: Secondary | ICD-10-CM | POA: Diagnosis not present

## 2016-11-28 DIAGNOSIS — Z7952 Long term (current) use of systemic steroids: Secondary | ICD-10-CM | POA: Diagnosis not present

## 2016-11-28 DIAGNOSIS — J690 Pneumonitis due to inhalation of food and vomit: Secondary | ICD-10-CM | POA: Diagnosis present

## 2016-11-28 DIAGNOSIS — E871 Hypo-osmolality and hyponatremia: Secondary | ICD-10-CM | POA: Diagnosis not present

## 2016-11-28 DIAGNOSIS — J45909 Unspecified asthma, uncomplicated: Secondary | ICD-10-CM | POA: Diagnosis not present

## 2016-11-28 DIAGNOSIS — N390 Urinary tract infection, site not specified: Secondary | ICD-10-CM | POA: Diagnosis present

## 2016-11-28 DIAGNOSIS — E1121 Type 2 diabetes mellitus with diabetic nephropathy: Secondary | ICD-10-CM

## 2016-11-28 DIAGNOSIS — I5022 Chronic systolic (congestive) heart failure: Secondary | ICD-10-CM | POA: Diagnosis present

## 2016-11-28 DIAGNOSIS — E039 Hypothyroidism, unspecified: Secondary | ICD-10-CM | POA: Diagnosis present

## 2016-11-28 DIAGNOSIS — Z7982 Long term (current) use of aspirin: Secondary | ICD-10-CM | POA: Diagnosis not present

## 2016-11-28 DIAGNOSIS — E785 Hyperlipidemia, unspecified: Secondary | ICD-10-CM | POA: Diagnosis present

## 2016-11-28 DIAGNOSIS — I959 Hypotension, unspecified: Secondary | ICD-10-CM | POA: Diagnosis present

## 2016-11-28 DIAGNOSIS — G8929 Other chronic pain: Secondary | ICD-10-CM | POA: Diagnosis present

## 2016-11-28 DIAGNOSIS — R262 Difficulty in walking, not elsewhere classified: Secondary | ICD-10-CM | POA: Diagnosis not present

## 2016-11-28 LAB — LACTIC ACID, PLASMA
LACTIC ACID, VENOUS: 2.8 mmol/L — AB (ref 0.5–1.9)
LACTIC ACID, VENOUS: 1.8 mmol/L (ref 0.5–1.9)

## 2016-11-28 LAB — INFLUENZA PANEL BY PCR (TYPE A & B)
INFLAPCR: NEGATIVE
INFLBPCR: NEGATIVE

## 2016-11-28 LAB — GLUCOSE, CAPILLARY
GLUCOSE-CAPILLARY: 249 mg/dL — AB (ref 65–99)
GLUCOSE-CAPILLARY: 387 mg/dL — AB (ref 65–99)
Glucose-Capillary: 449 mg/dL — ABNORMAL HIGH (ref 65–99)
Glucose-Capillary: 256 mg/dL — ABNORMAL HIGH (ref 65–99)
Glucose-Capillary: 143 mg/dL — ABNORMAL HIGH (ref 65–99)

## 2016-11-28 LAB — COMPREHENSIVE METABOLIC PANEL
ALBUMIN: 3.4 g/dL — AB (ref 3.5–5.0)
ALT: 19 U/L (ref 14–54)
AST: 42 U/L — ABNORMAL HIGH (ref 15–41)
Alkaline Phosphatase: 59 U/L (ref 38–126)
Anion gap: 20 — ABNORMAL HIGH (ref 5–15)
BUN: 38 mg/dL — ABNORMAL HIGH (ref 6–20)
CO2: 14 mmol/L — AB (ref 22–32)
Calcium: 8.5 mg/dL — ABNORMAL LOW (ref 8.9–10.3)
Chloride: 99 mmol/L — ABNORMAL LOW (ref 101–111)
Creatinine, Ser: 1.87 mg/dL — ABNORMAL HIGH (ref 0.44–1.00)
GFR calc non Af Amer: 24 mL/min — ABNORMAL LOW (ref >60)
GFR, EST AFRICAN AMERICAN: 28 mL/min — AB (ref >60)
GLUCOSE: 447 mg/dL — AB (ref 65–99)
POTASSIUM: 4.3 mmol/L (ref 3.5–5.1)
SODIUM: 133 mmol/L — AB (ref 135–145)
Total Bilirubin: 1.6 mg/dL — ABNORMAL HIGH (ref 0.3–1.2)
Total Protein: 5.7 g/dL — ABNORMAL LOW (ref 6.5–8.1)

## 2016-11-28 LAB — CBC
HEMATOCRIT: 29 % — AB (ref 36.0–46.0)
Hemoglobin: 9.6 g/dL — ABNORMAL LOW (ref 12.0–15.0)
MCH: 32.1 pg (ref 26.0–34.0)
MCHC: 33.1 g/dL (ref 30.0–36.0)
MCV: 97 fL (ref 78.0–100.0)
Platelets: 226 10*3/uL (ref 150–400)
RBC: 2.99 MIL/uL — AB (ref 3.87–5.11)
RDW: 14.4 % (ref 11.5–15.5)
WBC: 13.9 10*3/uL — ABNORMAL HIGH (ref 4.0–10.5)

## 2016-11-28 LAB — SALICYLATE LEVEL

## 2016-11-28 LAB — MRSA PCR SCREENING: MRSA by PCR: NEGATIVE

## 2016-11-28 LAB — CBG MONITORING, ED: Glucose-Capillary: 457 mg/dL — ABNORMAL HIGH (ref 65–99)

## 2016-11-28 MED ORDER — INSULIN ASPART 100 UNIT/ML ~~LOC~~ SOLN
5.0000 [IU] | Freq: Once | SUBCUTANEOUS | Status: AC
  Administered 2016-11-28: 5 [IU] via SUBCUTANEOUS
  Filled 2016-11-28: qty 1

## 2016-11-28 MED ORDER — CEFTRIAXONE SODIUM 2 G IJ SOLR
2.0000 g | INTRAMUSCULAR | Status: DC
Start: 2016-11-28 — End: 2016-12-01
  Administered 2016-11-28 – 2016-12-01 (×4): 2 g via INTRAVENOUS
  Filled 2016-11-28 (×5): qty 2

## 2016-11-28 MED ORDER — ASPIRIN EC 81 MG PO TBEC
81.0000 mg | DELAYED_RELEASE_TABLET | Freq: Every day | ORAL | Status: DC
  Administered 2016-11-28 – 2016-12-02 (×4): 81 mg via ORAL
  Filled 2016-11-28 (×4): qty 1

## 2016-11-28 MED ORDER — HYDROCORTISONE NA SUCCINATE PF 100 MG IJ SOLR
50.0000 mg | Freq: Three times a day (TID) | INTRAMUSCULAR | Status: DC
  Administered 2016-11-28: 50 mg via INTRAVENOUS
  Filled 2016-11-28: qty 2

## 2016-11-28 MED ORDER — METHYLPREDNISOLONE SODIUM SUCC 125 MG IJ SOLR
60.0000 mg | Freq: Two times a day (BID) | INTRAMUSCULAR | Status: DC
  Administered 2016-11-28 – 2016-11-29 (×3): 60 mg via INTRAVENOUS
  Filled 2016-11-28 (×3): qty 2

## 2016-11-28 MED ORDER — INSULIN ASPART 100 UNIT/ML ~~LOC~~ SOLN
10.0000 [IU] | Freq: Once | SUBCUTANEOUS | Status: AC
  Administered 2016-11-28: 10 [IU] via SUBCUTANEOUS

## 2016-11-28 MED ORDER — SODIUM CHLORIDE 0.9 % IV SOLN
INTRAVENOUS | Status: DC
  Administered 2016-11-28 (×3): via INTRAVENOUS

## 2016-11-28 MED ORDER — VANCOMYCIN HCL IN DEXTROSE 750-5 MG/150ML-% IV SOLN
750.0000 mg | INTRAVENOUS | Status: DC
Start: 2016-11-30 — End: 2016-12-01
  Administered 2016-11-30: 750 mg via INTRAVENOUS
  Filled 2016-11-28: qty 150

## 2016-11-28 MED ORDER — GUAIFENESIN ER 600 MG PO TB12
1200.0000 mg | ORAL_TABLET | Freq: Two times a day (BID) | ORAL | Status: DC
  Administered 2016-11-28 – 2016-12-02 (×8): 1200 mg via ORAL
  Filled 2016-11-28 (×4): qty 2
  Filled 2016-11-28: qty 1
  Filled 2016-11-28 (×4): qty 2

## 2016-11-28 MED ORDER — IPRATROPIUM-ALBUTEROL 0.5-2.5 (3) MG/3ML IN SOLN
3.0000 mL | Freq: Four times a day (QID) | RESPIRATORY_TRACT | Status: DC
  Administered 2016-11-28 (×2): 3 mL via RESPIRATORY_TRACT
  Filled 2016-11-28 (×2): qty 3

## 2016-11-28 MED ORDER — INSULIN ASPART 100 UNIT/ML ~~LOC~~ SOLN
0.0000 [IU] | Freq: Every day | SUBCUTANEOUS | Status: DC
  Administered 2016-12-01: 2 [IU] via SUBCUTANEOUS

## 2016-11-28 MED ORDER — SODIUM CHLORIDE 0.9 % IV SOLN: 500.0000 mg | INTRAVENOUS | Status: DC

## 2016-11-28 MED ORDER — INSULIN ASPART 100 UNIT/ML ~~LOC~~ SOLN
0.0000 [IU] | Freq: Three times a day (TID) | SUBCUTANEOUS | Status: DC
  Administered 2016-11-28 – 2016-11-29 (×2): 8 [IU] via SUBCUTANEOUS
  Administered 2016-11-29: 11 [IU] via SUBCUTANEOUS
  Administered 2016-11-30: 3 [IU] via SUBCUTANEOUS
  Administered 2016-11-30: 2 [IU] via SUBCUTANEOUS
  Administered 2016-12-01 (×2): 5 [IU] via SUBCUTANEOUS
  Administered 2016-12-01: 3 [IU] via SUBCUTANEOUS
  Administered 2016-12-02 (×2): 2 [IU] via SUBCUTANEOUS

## 2016-11-28 MED ORDER — SODIUM CHLORIDE 0.9 % IV SOLN
INTRAVENOUS | Status: AC
  Administered 2016-11-28: 01:00:00 via INTRAVENOUS

## 2016-11-28 MED ORDER — INSULIN GLARGINE 100 UNIT/ML ~~LOC~~ SOLN
10.0000 [IU] | Freq: Every day | SUBCUTANEOUS | Status: DC
  Administered 2016-11-28 – 2016-12-01 (×4): 10 [IU] via SUBCUTANEOUS
  Filled 2016-11-28 (×5): qty 0.1

## 2016-11-28 MED ORDER — ONDANSETRON HCL 4 MG/2ML IJ SOLN
4.0000 mg | Freq: Four times a day (QID) | INTRAMUSCULAR | Status: DC | PRN
  Administered 2016-11-30: 4 mg via INTRAVENOUS
  Filled 2016-11-28: qty 2

## 2016-11-28 MED ORDER — ACETAMINOPHEN 325 MG PO TABS
650.0000 mg | ORAL_TABLET | Freq: Four times a day (QID) | ORAL | Status: DC | PRN
Start: 2016-11-28 — End: 2016-12-02

## 2016-11-28 MED ORDER — ONDANSETRON HCL 4 MG PO TABS: 4.0000 mg | ORAL_TABLET | Freq: Four times a day (QID) | ORAL | Status: DC | PRN

## 2016-11-28 MED ORDER — PIPERACILLIN-TAZOBACTAM 3.375 G IVPB
3.3750 g | Freq: Three times a day (TID) | INTRAVENOUS | Status: DC
  Administered 2016-11-28: 3.375 g via INTRAVENOUS
  Filled 2016-11-28 (×2): qty 50

## 2016-11-28 MED ORDER — INSULIN ASPART 100 UNIT/ML ~~LOC~~ SOLN
3.0000 [IU] | Freq: Three times a day (TID) | SUBCUTANEOUS | Status: DC
  Administered 2016-11-28 – 2016-12-02 (×8): 3 [IU] via SUBCUTANEOUS

## 2016-11-28 MED ORDER — TIOTROPIUM BROMIDE MONOHYDRATE 18 MCG IN CAPS
18.0000 ug | ORAL_CAPSULE | Freq: Every day | RESPIRATORY_TRACT | Status: DC
  Filled 2016-11-28: qty 5

## 2016-11-28 MED ORDER — ENOXAPARIN SODIUM 30 MG/0.3ML ~~LOC~~ SOLN
30.0000 mg | SUBCUTANEOUS | Status: DC
  Administered 2016-11-28 – 2016-12-01 (×4): 30 mg via SUBCUTANEOUS
  Filled 2016-11-28 (×4): qty 0.3

## 2016-11-28 MED ORDER — CEFTRIAXONE SODIUM 1 G IJ SOLR: 1.0000 g | INTRAMUSCULAR | Status: DC

## 2016-11-28 MED ORDER — PIPERACILLIN-TAZOBACTAM 3.375 G IVPB 30 MIN
3.3750 g | Freq: Once | INTRAVENOUS | Status: AC
  Administered 2016-11-28: 3.375 g via INTRAVENOUS
  Filled 2016-11-28: qty 50

## 2016-11-28 MED ORDER — FENTANYL 25 MCG/HR TD PT72
25.0000 ug | MEDICATED_PATCH | TRANSDERMAL | Status: DC
  Administered 2016-11-28 – 2016-12-01 (×2): 25 ug via TRANSDERMAL
  Filled 2016-11-28 (×2): qty 1

## 2016-11-28 MED ORDER — LEVOTHYROXINE SODIUM 100 MCG PO TABS
100.0000 ug | ORAL_TABLET | Freq: Every day | ORAL | Status: DC
  Administered 2016-11-28 – 2016-12-02 (×5): 100 ug via ORAL
  Filled 2016-11-28 (×5): qty 1

## 2016-11-28 MED ORDER — SODIUM CHLORIDE 0.9% FLUSH
3.0000 mL | Freq: Two times a day (BID) | INTRAVENOUS | Status: DC
  Administered 2016-11-28 – 2016-12-01 (×9): 3 mL via INTRAVENOUS

## 2016-11-28 MED ORDER — VANCOMYCIN HCL IN DEXTROSE 1-5 GM/200ML-% IV SOLN
1000.0000 mg | Freq: Once | INTRAVENOUS | Status: AC
  Administered 2016-11-28: 1000 mg via INTRAVENOUS
  Filled 2016-11-28: qty 200

## 2016-11-28 MED ORDER — SODIUM CHLORIDE 0.9 % IV BOLUS (SEPSIS)
500.0000 mL | Freq: Once | INTRAVENOUS | Status: AC
  Administered 2016-11-28: 500 mL via INTRAVENOUS

## 2016-11-28 MED ORDER — INSULIN GLARGINE 100 UNIT/ML ~~LOC~~ SOLN: 5.0000 [IU] | Freq: Every day | SUBCUTANEOUS | Status: DC

## 2016-11-28 MED ORDER — SODIUM CHLORIDE 0.9 % IV SOLN
Freq: Once | INTRAVENOUS | Status: AC
  Administered 2016-11-28: 01:00:00 via INTRAVENOUS

## 2016-11-28 MED ORDER — LORAZEPAM 1 MG PO TABS: 1.0000 mg | ORAL_TABLET | Freq: Three times a day (TID) | ORAL | Status: DC | PRN

## 2016-11-28 MED ORDER — INSULIN ASPART 100 UNIT/ML ~~LOC~~ SOLN
0.0000 [IU] | Freq: Three times a day (TID) | SUBCUTANEOUS | Status: DC
  Administered 2016-11-28: 3 [IU] via SUBCUTANEOUS
  Administered 2016-11-28: 9 [IU] via SUBCUTANEOUS

## 2016-11-28 MED ORDER — ACETAMINOPHEN 650 MG RE SUPP: 650.0000 mg | Freq: Four times a day (QID) | RECTAL | Status: DC | PRN

## 2016-11-28 NOTE — ED Notes (Signed)
Pt noted to by hypotensive. Jarrett Soho PA at bedside and is aware. Additional 524ml NS bag hung. Pt appears more restless.

## 2016-11-28 NOTE — ED Notes (Signed)
Dr. Danford at bedside  

## 2016-11-28 NOTE — Progress Notes (Signed)
Pharmacy Antibiotic Note  Michelle [name] is a 83 y.o. female admitted on 11/27/2016 with sepsis.  Pharmacy has been consulted for Vancomycin/Zosyn dosing. WBC mildly elevated. CrCl ~25-30. Here with N/V/D. CT abdomen unremarkable. CXR ok.   Plan: Continue ceftriaxone 2g IV Q24 Continue vancomycin 750mg  IV Q48 Monitor clinical picture, renal function, VT prn F/U C&S, abx deescalation / LOT  Height: 5\' 1"  (154.9 cm) Weight: 119 lb 4.3 oz (54.1 kg) IBW/kg (Calculated) : 47.8  Temp (24hrs), Avg:98.7 F (37.1 C), Min:98.2 F (36.8 C), Max:99.1 F (37.3 C)   Recent Labs Lab 11/27/16 1945 11/27/16 2002 11/27/16 2359 11/28/16 0243 11/28/16 0548  WBC 13.2*  --   --  13.9*  --   CREATININE 1.18*  --   --  1.87*  --   LATICACIDVEN  --  2.15* 2.70* 2.8* 1.8    Estimated Creatinine Clearance: 17.5 mL/min (by C-G formula based on SCr of 1.87 mg/dL (H)).    No Known Allergies  Antimicrobials this admission:  Zosyn 1/19 >> 1/19 Vancomycin 1/19 >> Ceftriaxone 1/19 >>  Dose adjustments this admission:  N/a  Microbiology results:  1/18 BCx: sent 1/18 UCx: sent 1/18 GI Panel: sent   1/19 MRSA PCR: negative  Michelle [name] J 11/28/2016 1:29 PM

## 2016-11-28 NOTE — H&P (Signed)
History and Physical  Patient Name: Michelle [name]     [id number]    DOB: 07-Dec-1931    DOA: 11/27/2016 PCP: [name], MD   Patient coming from: Home via EMS  Chief Complaint: Confusion, vomiting, stool  HPI: Michelle [name] is a 83 y.o. female with a past medical history significant for dementia, COPD, PMR on pred 5, hypothyroidism, IDDM and chronic back pain on fentanyl patch 35mcg who presents with confusion, vomiting and fecal incontinence for 1 day.  The patient started to vomit and have loose stools a day or two ago.  Since then, she has gotten progressively more weak and confused.  Today, they found her in bed with vomit all over her, stooled all in her bed.  She had a recent UTI (PCP diagnosed by phone based on foul-smelling urine), culture grew enterococcus, treated with amoxicillin for 5 days.  The patient endorses dysuria.  ED course: -Temp 10F, heart rate 80, respirations 24, pulse ox normal, BP 114/75 intially, dropped to 70/60 in the ER. -Na 134, K 4.7, Cr 1.18 (baseline 0.9), WBC 13.2K, Hgb 10.8 -Lactic acid 2.15 initially -UA showed WBC TNTC -CXR showed no pneumonia -CT abdomen pelvis showed no abnormalities or free air or colitis or SBO -She was given 30 cc/kg bolus, urine and blood were obtained and vanc and Zosyn were administered and TRH were asked to evaluate for sepsis from urinary source         ROS: Review of Systems  Constitutional: Negative for chills and fever.  Respiratory: Negative for cough.   Gastrointestinal: Positive for diarrhea and vomiting.  Genitourinary: Positive for dysuria.  Neurological: Positive for dizziness (and confusion) and weakness.  All other systems reviewed and are negative.         Past Medical History:  Diagnosis Date  . Arthritis   . Asthma   . COPD (chronic obstructive pulmonary disease) ([place])   . Coronary atherosclerosis   . Dyslipidemia   . Gallstones   . History of fractured kneecap   .  Hypothyroidism   . Interstitial cystitis   . Malaise and fatigue   . Memory loss   . Polymyalgia rheumatica ([place])   . Systemic hypertension   . Type II or unspecified type diabetes mellitus without mention of complication, not stated as uncontrolled   . UTI (lower urinary tract infection)     Past Surgical History:  Procedure Laterality Date  . CARDIAC CATHETERIZATION  01/29/2006   10% luminal irregularity mid LAD  . CARDIAC CATHETERIZATION  03/15/2001   No significant CAD, no evidence of renal artery stenosis, systemic hypertenion  . CARDIAC CATHETERIZATION N/A 08/21/2016   Procedure: Temporary Pacemaker;  Surgeon: Jettie Booze, MD;  Location: [facility];  Service: Cardiovascular;  Laterality: N/A;  . CARDIAC CATHETERIZATION N/A 09/28/2016   Procedure: Temporary Pacemaker;  Surgeon: Burnell Blanks, MD;  Location: [facility];  Service: Cardiovascular;  Laterality: N/A;  . CERVICAL SPINE SURGERY    . CHOLECYSTECTOMY    . EP IMPLANTABLE DEVICE N/A 09/29/2016   Procedure: Pacemaker Implant;  Surgeon: Evans Lance, MD;  Location: [facility];  Service: Cardiovascular;  Laterality: N/A;  . HERNIA REPAIR    . LUMBAR DISC SURGERY    . NM MYOCAR PERF WALL MOTION  08/17/2009   normal  . ROTATOR CUFF REPAIR    . TONSILLECTOMY    . TOTAL ABDOMINAL HYSTERECTOMY    . US ECHOCARDIOGRAPHY  09/14/2007  mild LVH, LA mildly dilated,mild mitral annular calcification, AOV mildly sclerotic, aortic root sclerosis/ca+    Social History: Patient lives in her home, but has 24 hour supervision from an aide, and two daughters who live next door.  The patient walks with a walker.  She has never smoked.  From [employer].  Was a homemaker.    No Known Allergies  Family history: family history includes Breast cancer in her mother; Heart disease in her father; Lung cancer in her brother.  Prior to Admission medications   Medication Sig Start Date End Date Taking?  Authorizing Provider  acetaminophen (TYLENOL) 325 MG tablet Take 2 tablets (650 mg total) by mouth every 6 (six) hours as needed for mild pain (or Fever >/= 101). 08/25/16  Yes Belkys A Regalado, MD  albuterol (PROVENTIL HFA;VENTOLIN HFA) 108 (90 Base) MCG/ACT inhaler Inhale 1-2 puffs into the lungs every 4 (four) hours as needed for wheezing or shortness of breath. 09/25/16  Yes Janne Napoleon, NP  amLODipine (NORVASC) 2.5 MG tablet Take 1 tablet (2.5 mg total) by mouth daily. 10/04/16  Yes Belkys A Regalado, MD  aspirin 81 MG tablet Take 81 mg by mouth daily.   Yes Historical Provider, MD  carvedilol (COREG) 6.25 MG tablet Take 6.25 mg by mouth 2 (two) times daily with a meal.   Yes Historical Provider, MD  diphenhydramine-acetaminophen (TYLENOL PM) 25-500 MG TABS tablet Take 2 tablets by mouth at bedtime.   Yes Historical Provider, MD  diphenhydramine-acetaminophen (TYLENOL PM) 25-500 MG TABS tablet Take 2 tablets by mouth at bedtime.   Yes Historical Provider, MD  fentaNYL (DURAGESIC - DOSED MCG/HR) 25 MCG/HR patch Place 25 mcg onto the skin every 3 (three) days.  05/26/16  Yes Historical Provider, MD  glucagon 1 MG injection Inject 1 mg into the vein once as needed. Patient taking differently: Inject 1 mg into the vein once as needed (hypoglycemia).  11/05/16  Yes Susy Frizzle, MD  HYDROcodone-acetaminophen (NORCO/VICODIN) 5-325 MG per tablet Take 1 tablet by mouth every 6 (six) hours as needed for moderate pain.    Yes Historical Provider, MD  insulin lispro (HUMALOG) 100 UNIT/ML injection Inject 0-0.06 mLs (0-6 Units total) into the skin 3 (three) times daily as needed for high blood sugar. Patient uses sliding scale 05/26/16  Yes Susy Frizzle, MD  insulin NPH Human (HUMULIN N,NOVOLIN N) 100 UNIT/ML injection Inject 5 Units into the skin 2 (two) times daily.   Yes Historical Provider, MD  Insulin Syringe-Needle U-100 31G X 5/16" 1 ML MISC For injection of insulin QID 07/29/16  Yes Susy Frizzle, MD  lactulose (CHRONULAC) 10 GM/15ML solution Take 45 mLs (30 g total) by mouth daily as needed for mild constipation. 10/03/16  Yes Belkys A Regalado, MD  levothyroxine (SYNTHROID, LEVOTHROID) 100 MCG tablet TAKE 1 TABLET (100 MCG TOTAL) BY MOUTH DAILY. 01/28/16  Yes Susy Frizzle, MD  meclizine (ANTIVERT) 12.5 MG tablet Take 1 tablet (12.5 mg total) by mouth daily as needed for dizziness (vertigo). Patient taking differently: Take 12.5 mg by mouth every morning.  10/03/16  Yes Belkys A Regalado, MD  Nutritional Supplements (ENSURE PO) Take 1 Can by mouth daily as needed (meal replacment).   Yes Historical Provider, MD  ONE TOUCH ULTRA TEST test strip TEST BLOOD SUGAR 5-6 TIMES A DAY E11.9 11/11/16  Yes Susy Frizzle, MD  polyethylene glycol (MIRALAX / GLYCOLAX) packet Take 17 g by mouth every morning.   Yes Historical  Provider, MD  predniSONE (DELTASONE) 5 MG tablet Take 5 mg by mouth daily.  04/15/16  Yes Historical Provider, MD  psyllium (HYDROCIL/METAMUCIL) 95 % PACK Take 1 packet by mouth 3 (three) times daily.   Yes Historical Provider, MD  telmisartan (MICARDIS) 40 MG tablet Take 40 mg by mouth daily as needed (blood pressure over 180).   Yes Historical Provider, MD  tiotropium (SPIRIVA HANDIHALER) 18 MCG inhalation capsule Place 1 capsule (18 mcg total) into inhaler and inhale daily. 03/17/16 02/26/18 Yes Clinton D Young, MD  traMADol (ULTRAM) 50 MG tablet Take 50 mg by mouth daily as needed for moderate pain.   Yes Historical Provider, MD  UNABLE TO FIND Take 1 tablet by mouth daily. Med Name: probiotic capsule   Takes one daily.    Yes Historical Provider, MD       Physical Exam: BP 112/92   Pulse 84   Temp 98.7 F (37.1 C) (Oral)   Resp 19   Wt 54.4 kg (120 lb)   SpO2 96%   BMI 23.44 kg/m  General appearance: Frail elderly adult female, awake and responds to questions but restless, mostly inattentive.   Eyes: Anicteric, conjunctiva pink, lids and lashes normal. PERRL.     ENT: No nasal deformity, discharge, epistaxis.  Hearing normal. OP dry without lesions.   Neck: No neck masses.  Trachea midline.  No thyromegaly/tenderness. Lymph: No cervical or supraclavicular lymphadenopathy. Skin: Warm and dry.  No jaundice.  No suspicious rashes or lesions. Cardiac: RRR, nl S1-S2, no murmurs appreciated.  Capillary refill is brisk.  JVP normal.  No LE edema.  Radial and DP pulses 2+ and symmetric. Respiratory: Normal respiratory rate and rhythm.  CTAB without rales or wheezes. Abdomen: Abdomen soft.  No TTP or rigidity or guarding. No ascites, distension, hepatosplenomegaly.   MSK: No deformities or effusions.  No cyanosis or clubbing. Neuro: Cranial nerves grossly normal.  Sensation intact to light touch. Speech is fluent.  Muscle strength 5-/5 and symmetric.    Psych: Sensorium intact and responding to questions, attention diminished.   Affect blunted.  Judgment and insight appear poor.     Labs on Admission:  I have personally reviewed following labs and imaging studies: CBC:  Recent Labs Lab 11/27/16 1945  WBC 13.2*  NEUTROABS 11.5*  HGB 10.8*  HCT 32.3*  MCV 95.3  PLT [id number]   Basic Metabolic Panel:  Recent Labs Lab 11/27/16 1945  NA 134*  K 4.7  CL 96*  CO2 20*  GLUCOSE 292*  BUN 33*  CREATININE 1.18*  CALCIUM 9.3   GFR: Estimated Creatinine Clearance: 26.4 mL/min (by C-G formula based on SCr of 1.18 mg/dL (H)).  Liver Function Tests:  Recent Labs Lab 11/27/16 1945  AST 36  ALT 15  ALKPHOS 62  BILITOT 1.1  PROT 6.1*  ALBUMIN 3.6    Recent Labs Lab 11/27/16 1945  LIPASE 11   No results for input(s): AMMONIA in the last 168 hours. Coagulation Profile: No results for input(s): INR, PROTIME in the last 168 hours. Cardiac Enzymes: No results for input(s): CKTOTAL, CKMB, CKMBINDEX, TROPONINI in the last 168 hours. BNP (last 3 results) No results for input(s): PROBNP in the last 8760 hours. HbA1C: No results for input(s):  HGBA1C in the last 72 hours. CBG:  Recent Labs Lab 11/28/16 0108  GLUCAP 457*   Lipid Profile: No results for input(s): CHOL, HDL, LDLCALC, TRIG, CHOLHDL, LDLDIRECT in the last 72 hours. Thyroid Function Tests: No results for  input(s): TSH, T4TOTAL, FREET4, T3FREE, THYROIDAB in the last 72 hours. Anemia Panel: No results for input(s): VITAMINB12, FOLATE, FERRITIN, TIBC, IRON, RETICCTPCT in the last 72 hours. Sepsis Labs: Lactic acdi 2.15 Invalid input(s): PROCALCITONIN, LACTICIDVEN No results found for this or any previous visit (from the past 240 hour(s)).       Radiological Exams on Admission: Personally reviewed CXR shows no focal opacity; CT abdomen report reviewed: Dg Chest 2 View  Result Date: 11/27/2016 CLINICAL DATA:  83 year old female with history of nausea, vomiting and diarrhea. EXAM: CHEST  2 VIEW COMPARISON:  Chest x-ray 10/01/2016. FINDINGS: Lung volumes are low. No consolidative airspace disease. No pleural effusions. No pneumothorax. No pulmonary nodule or mass noted. Pulmonary vasculature and the cardiomediastinal silhouette are within normal limits. Aortic atherosclerosis. Left-sided pacemaker device in place with lead tips projecting over the expected location of the right atrium and right ventricular apex. Orthopedic fixation hardware in the lower cervical spine incidentally noted. IMPRESSION: 1. Low lung volumes without radiographic evidence of acute cardiopulmonary disease. 2. Aortic atherosclerosis. Electronically Signed   By: Vinnie Langton M.D.   On: 11/27/2016 19:21   Ct Abdomen Pelvis W Contrast  Result Date: 11/28/2016 CLINICAL DATA:  Altered mental status vomiting EXAM: CT ABDOMEN AND PELVIS WITH CONTRAST TECHNIQUE: Multidetector CT imaging of the abdomen and pelvis was performed using the standard protocol following bolus administration of intravenous contrast. CONTRAST:  163mL ISOVUE-300 IOPAMIDOL (ISOVUE-300) INJECTION 61% COMPARISON:  None. FINDINGS:  Lower chest: Lung bases demonstrate patchy dependent atelectasis at the left base. No pleural effusion. Cardiomegaly is present. Partially visualized pacer leads. Dense mitral calcifications. Hepatobiliary: No focal hepatic abnormality. Surgical clips in the gallbladder fossa. No biliary dilatation Pancreas: Unremarkable. No pancreatic ductal dilatation or surrounding inflammatory changes. Spleen: Normal in size without focal abnormality. Adrenals/Urinary Tract: Adrenal glands within normal limits. Kidneys show no hydronephrosis. The bladder is enlarged but otherwise unremarkable Stomach/Bowel: The stomach is nonenlarged. There is no dilated small bowel. No colon wall thickening. Appendix normal. Vascular/Lymphatic: Moderate atherosclerotic vascular calcifications of the aorta and branch vessels. No grossly enlarged lymph nodes Reproductive: Status post hysterectomy. No adnexal masses. Other: No free air or free fluid.  Diffuse subcutaneous edema. Musculoskeletal: Degenerative changes of the spine with minimal retrolisthesis of L3 on L4. Minimal anterolisthesis of L4 on L5. Prominent Schmorl's node at L4 superiorly. IMPRESSION: No CT evidence for a bowel obstruction or other acute intra-abdominal or pelvic pathology Electronically Signed   By: Donavan Foil M.D.   On: 11/28/2016 00:00    EKG: Independently reviewed. Rate 83, QTc 533, QRS 171, paced.  Echo Oct 2017: EF 45-50% Grade II DD Mild AS Mild to moderate TR PAP 53       Assessment/Plan  1. Sepsis, suspected from UTI:  Suspected source urine. Organism unknown.   Patient meets criteria given tachypnea, leukocytosis, and evidence of organ dysfunction.  Lactate 2.15 mmol/L and repeat ordered within 6 hours.  This patient is at high risk of poor outcomes with a qSOFA score of 3.  Antibiotics delivered in the ED.    -Sepsis bundle utilized:  -Blood and urine cultures drawn  -30 ml/kg bolus given in ED, will repeat lactic  acid  -Antibiotics: vancomycin and piperacililn-tazobactam  -Repeat renal function and complete blood count in AM  -Code SEPSIS called to E-link  -Obtain flu swab  -Stool formed in ER, C diff doubted, GI path panel ordered to r/o Norovirus  -Stress dose steroids, Solu-cortef 50 q8hrs  2. Hyponatremia:  Mild, in setting of sepsis. -Fluids and trend BMP  3. Anemia:  Chronic, normocytic.  At baseline.  4. Insulin dependent diabetes:  -Hold NPH -Glargine 5 units nightly -SSI with meals  5. PMR:  -Hold prednisone -HC as above  6. Chronic back pain:  -Continue fentanyl patch  7. Hypertension and coronary secondary prevention:  -Continue aspirin -Hold all antihypertensives  8. COPD: No wheezing or dyspnea. -Continue Spiriva  9. Hypothyroidism: -Continue levothyroxine           DVT prophylaxis: Lovenox  Code Status: DO NOT RESUSCITATE  Family Communication: Daughter and grand daughter at bedside  Disposition Plan: Anticipate IV fluids and empiric antibiotics and follow urine culture Consults called: None Admission status: INPATIENT    Medical decision making: Patient seen at 1:45 AM on 11/28/2016.  The patient was discussed with Abigail Butts, PA-C.  What exists of the patient's chart was reviewed in depth and summarized above.  Clinical condition: with some hypotension still, will give additional fluids and monitor in stepdown.  Given Age, CHF with reduced EF, dementia and poor mentation, overall prognosis is somewhat gaurded.        Edwin Dada [facility] Pager [phone]      At the time of admission, it appears that the appropriate admission status for this patient is INPATIENT. This is judged to be reasonable and necessary in order to provide the required intensity of service to ensure the patient's safety given the presenting symptoms, physical exam findings, and initial radiographic and laboratory data in the context of  their chronic comorbidities.  Together, these circumstances are felt to place her at high risk for further clinical deterioration threatening life, limb, or organ.   Patient requires inpatient status due to high intensity of service, high risk for further deterioration and high frequency of surveillance required because of this acute illness that poses a threat to life, limb or bodily function.  Elements that support INPATIENT status include: Evidence of infection with hypotension, altered mentation, elevated and rising lactic acid Hyponatremia Chronic systolic CHF Polymyalgia rheumatica on chronic steroids Dementia Insulin dependent diabetes  I certify that at the point of admission it is my clinical judgment that the patient will require inpatient hospital care spanning beyond 2 midnights from the point of admission and that early discharge would result in unnecessary risk of decompensation and readmission or threat to life, limb or bodily function.

## 2016-11-28 NOTE — Progress Notes (Signed)
Pharmacy Antibiotic Note  Michelle [name] is a 83 y.o. female admitted on 11/27/2016 with sepsis.  Pharmacy has been consulted for Vancomycin/Zosyn dosing. WBC mildly elevated. CrCl ~25-30. Here with N/V/D. CT abdomen unremarkable. CXR ok.   Plan: -Vancomycin 500 mg IV q24h -Zosyn 3.375G IV q8h to be infused over 4 hours -Trend WBC, temp, renal function  -Drug levels as indicated   Weight: 120 lb (54.4 kg)  Temp (24hrs), Avg:98.7 F (37.1 C), Min:98.4 F (36.9 C), Max:99 F (37.2 C)   Recent Labs Lab 11/27/16 1945 11/27/16 2002 11/27/16 2359  WBC 13.2*  --   --   CREATININE 1.18*  --   --   LATICACIDVEN  --  2.15* 2.70*    Estimated Creatinine Clearance: 26.4 mL/min (by C-G formula based on SCr of 1.18 mg/dL (H)).    No Known Allergies   Narda Bonds 11/28/2016 2:09 AM

## 2016-11-28 NOTE — Progress Notes (Signed)
[facility]                                                                              Patient Demographics  Michelle [name], is a 83 y.o. female, DOB - 05/03/1932, [id number]  Admit date - 11/27/2016   Admitting Physician Edwin Dada, MD  Outpatient Primary MD for the patient is [name], MD  Outpatient specialists:   LOS - 0  days    Chief Complaint  Patient presents with  . Nausea  . Emesis  . Diarrhea       Brief summary   Patient is a 83 year old female with dementia, COPD, PMR on prednisone, hypothyroidism, diabetes presented with confusion, vomiting and fecal incontinence.She had a recent UTI (PCP diagnosed by phone based on foul-smelling urine), culture grew enterococcus, treated with amoxicillin for 5 days.  The patient endorses dysuria.    Assessment & Plan    Principal Problem:  Sepsis, suspected from UTI: Recent enterococcus UTI - Follow urine culture and blood cultures, placed patient on IV Rocephin - reviewed prior urine culture results, placed back on IV vancomycin until urine culture and sensitivities are back - Influenza swab negative - Continue IV fluid hydration  COPD exacerbation - Coarse breath sounds during lung exam - Placed on IV Solu-Medrol, duo nebs, Mucinex    Hyponatremia:  Mild, in setting of sepsis. -continue IV fluid hydration   Anemia:  Chronic, normocytic.  At baseline.   Insulin dependent diabetes:  with hyperglycemia scan due to steroids -Increase Lantus, and meal coverage, increase sliding scale to moderate  PMR:  -Hold prednisone Continue IV Solu-Medrol  Chronic back pain:  -Continue fentanyl patch  Hypertension and coronary secondary prevention:  -Continue aspirin -Hold all antihypertensives  Hypothyroidism: -Continue levothyroxine  Code Status: DNR DVT Prophylaxis:  Lovenox Family Communication: Discussed in detail with the patient, all imaging results, lab  results explained to the patient and daughter   Disposition Plan:  Time Spent in minutes   25 minutes  Procedures:  None   Consultants:   none   Antimicrobials:      Medications  Scheduled Meds: . aspirin EC  81 mg Oral Daily  . cefTRIAXone (ROCEPHIN)  IV  2 g Intravenous Q24H  . enoxaparin (LOVENOX) injection  30 mg Subcutaneous Q24H  . fentaNYL  25 mcg Transdermal Q72H  . guaiFENesin  1,200 mg Oral BID  . insulin aspart  0-5 Units Subcutaneous QHS  . insulin aspart  0-9 Units Subcutaneous TID WC  . insulin glargine  5 Units Subcutaneous QHS  . ipratropium-albuterol  3 mL Nebulization Q6H  . levothyroxine  100 mcg Oral QAC breakfast  . methylPREDNISolone (SOLU-MEDROL) injection  60 mg Intravenous Q12H  . sodium chloride flush  3 mL Intravenous Q12H   Continuous Infusions: . sodium chloride 100 mL/hr at 11/28/16 0256   PRN Meds:.acetaminophen **OR** acetaminophen, ondansetron **OR** ondansetron (ZOFRAN) IV   Antibiotics   Anti-infectives    Start     Dose/Rate Route Frequency Ordered Stop   11/28/16 2200  vancomycin (VANCOCIN) 500 mg in sodium chloride 0.9 % 100 mL IVPB  Status:  Discontinued  500 mg 100 mL/hr over 60 Minutes Intravenous Every 24 hours 11/28/16 0212 11/28/16 0917   11/28/16 0930  cefTRIAXone (ROCEPHIN) 1 g in dextrose 5 % 50 mL IVPB  Status:  Discontinued     1 g 100 mL/hr over 30 Minutes Intravenous Every 24 hours 11/28/16 0917 11/28/16 0917   11/28/16 0930  cefTRIAXone (ROCEPHIN) 2 g in dextrose 5 % 50 mL IVPB     2 g 100 mL/hr over 30 Minutes Intravenous Every 24 hours 11/28/16 0917     11/28/16 0600  piperacillin-tazobactam (ZOSYN) IVPB 3.375 g  Status:  Discontinued     3.375 g 12.5 mL/hr over 240 Minutes Intravenous Every 8 hours 11/28/16 0212 11/28/16 0917   11/28/16 0045  piperacillin-tazobactam (ZOSYN) IVPB 3.375 g     3.375 g 100 mL/hr over 30 Minutes Intravenous  Once 11/28/16 0033 11/28/16 0141   11/28/16 0045  vancomycin  (VANCOCIN) IVPB 1000 mg/200 mL premix     1,000 mg 200 mL/hr over 60 Minutes Intravenous  Once 11/28/16 0033 11/28/16 0200        Subjective:   Michelle [name] was seen and examined today.  Feeling better this morning.  Patient denies dizziness, chest pain,  abdominal pain, N/V/D/C, new weakness, numbess, tingling. No acute events overnight. no  fevers.  Objective:   Vitals:   11/28/16 0800 11/28/16 0900 11/28/16 1018 11/28/16 1234  BP: (!) 127/59 (!) 142/71  (!) 165/87  Pulse: 68 72    Resp: 14 13  (!) 26  Temp:    98.2 F (36.8 C)  TempSrc:    Oral  SpO2: 99% 99% 99%   Weight:      Height:        Intake/Output Summary (Last 24 hours) at 11/28/16 1313 Last data filed at 11/28/16 0900  Gross per 24 hour  Intake             2773 ml  Output              400 ml  Net             2373 ml     Wt Readings from Last 3 Encounters:  11/28/16 54.1 kg (119 lb 4.3 oz)  10/20/16 54.1 kg (119 lb 4.8 oz)  09/28/16 54.4 kg (119 lb 14.9 oz)     Exam  General: Alert and oriented x 2, NAD  HEENT:    Neck: Supple, no JVD, no masses  Cardiovascular: S1 S2 auscultated, no rubs, murmurs or gallops. Regular rate and rhythm.  Respiratory: Bilateral rhonchi  Gastrointestinal: Soft, nontender, nondistended, + bowel sounds  Ext: no cyanosis clubbing or edema  Neuro: AAOx3, Cr N's II- XII. Strength 5/5 upper and lower extremities bilaterally  Skin: No rashes  Psych: Normal affect and demeanor, alert and oriented x2   Data Reviewed:  I have personally reviewed following labs and imaging studies  Micro Results Recent Results (from the past 240 hour(s))  MRSA PCR Screening     Status: None   Collection Time: 11/28/16  2:22 AM  Result Value Ref Range Status   MRSA by PCR NEGATIVE NEGATIVE Final    Comment:        The GeneXpert MRSA Assay (FDA approved for NASAL specimens only), is one component of a comprehensive MRSA colonization surveillance program. It is not intended to  diagnose MRSA infection nor to guide or monitor treatment for MRSA infections.     Radiology Reports Dg Chest 2 View  Result Date: 11/27/2016 CLINICAL DATA:  83 year old female with history of nausea, vomiting and diarrhea. EXAM: CHEST  2 VIEW COMPARISON:  Chest x-ray 10/01/2016. FINDINGS: Lung volumes are low. No consolidative airspace disease. No pleural effusions. No pneumothorax. No pulmonary nodule or mass noted. Pulmonary vasculature and the cardiomediastinal silhouette are within normal limits. Aortic atherosclerosis. Left-sided pacemaker device in place with lead tips projecting over the expected location of the right atrium and right ventricular apex. Orthopedic fixation hardware in the lower cervical spine incidentally noted. IMPRESSION: 1. Low lung volumes without radiographic evidence of acute cardiopulmonary disease. 2. Aortic atherosclerosis. Electronically Signed   By: Vinnie Langton M.D.   On: 11/27/2016 19:21   Ct Abdomen Pelvis W Contrast  Result Date: 11/28/2016 CLINICAL DATA:  Altered mental status vomiting EXAM: CT ABDOMEN AND PELVIS WITH CONTRAST TECHNIQUE: Multidetector CT imaging of the abdomen and pelvis was performed using the standard protocol following bolus administration of intravenous contrast. CONTRAST:  171mL ISOVUE-300 IOPAMIDOL (ISOVUE-300) INJECTION 61% COMPARISON:  None. FINDINGS: Lower chest: Lung bases demonstrate patchy dependent atelectasis at the left base. No pleural effusion. Cardiomegaly is present. Partially visualized pacer leads. Dense mitral calcifications. Hepatobiliary: No focal hepatic abnormality. Surgical clips in the gallbladder fossa. No biliary dilatation Pancreas: Unremarkable. No pancreatic ductal dilatation or surrounding inflammatory changes. Spleen: Normal in size without focal abnormality. Adrenals/Urinary Tract: Adrenal glands within normal limits. Kidneys show no hydronephrosis. The bladder is enlarged but otherwise unremarkable  Stomach/Bowel: The stomach is nonenlarged. There is no dilated small bowel. No colon wall thickening. Appendix normal. Vascular/Lymphatic: Moderate atherosclerotic vascular calcifications of the aorta and branch vessels. No grossly enlarged lymph nodes Reproductive: Status post hysterectomy. No adnexal masses. Other: No free air or free fluid.  Diffuse subcutaneous edema. Musculoskeletal: Degenerative changes of the spine with minimal retrolisthesis of L3 on L4. Minimal anterolisthesis of L4 on L5. Prominent Schmorl's node at L4 superiorly. IMPRESSION: No CT evidence for a bowel obstruction or other acute intra-abdominal or pelvic pathology Electronically Signed   By: Donavan Foil M.D.   On: 11/28/2016 00:00    Lab Data:  CBC:  Recent Labs Lab 11/27/16 1945 11/28/16 0243  WBC 13.2* 13.9*  NEUTROABS 11.5*  --   HGB 10.8* 9.6*  HCT 32.3* 29.0*  MCV 95.3 97.0  PLT 244 [id number]   Basic Metabolic Panel:  Recent Labs Lab 11/27/16 1945 11/28/16 0243  NA 134* 133*  K 4.7 4.3  CL 96* 99*  CO2 20* 14*  GLUCOSE 292* 447*  BUN 33* 38*  CREATININE 1.18* 1.87*  CALCIUM 9.3 8.5*   GFR: Estimated Creatinine Clearance: 17.5 mL/min (by C-G formula based on SCr of 1.87 mg/dL (H)). Liver Function Tests:  Recent Labs Lab 11/27/16 1945 11/28/16 0243  AST 36 42*  ALT 15 19  ALKPHOS 62 59  BILITOT 1.1 1.6*  PROT 6.1* 5.7*  ALBUMIN 3.6 3.4*    Recent Labs Lab 11/27/16 1945  LIPASE 11   No results for input(s): AMMONIA in the last 168 hours. Coagulation Profile: No results for input(s): INR, PROTIME in the last 168 hours. Cardiac Enzymes: No results for input(s): CKTOTAL, CKMB, CKMBINDEX, TROPONINI in the last 168 hours. BNP (last 3 results) No results for input(s): PROBNP in the last 8760 hours. HbA1C: No results for input(s): HGBA1C in the last 72 hours. CBG:  Recent Labs Lab 11/28/16 0108 11/28/16 0339 11/28/16 0741  GLUCAP 457* 449* 249*   Lipid Profile: No results for  input(s): CHOL, HDL, LDLCALC, TRIG, CHOLHDL,  LDLDIRECT in the last 72 hours. Thyroid Function Tests: No results for input(s): TSH, T4TOTAL, FREET4, T3FREE, THYROIDAB in the last 72 hours. Anemia Panel: No results for input(s): VITAMINB12, FOLATE, FERRITIN, TIBC, IRON, RETICCTPCT in the last 72 hours. Urine analysis:    Component Value Date/Time   COLORURINE YELLOW 11/27/2016 2241   APPEARANCEUR CLOUDY (A) 11/27/2016 2241   LABSPEC 1.015 11/27/2016 2241   PHURINE 6.0 11/27/2016 2241   GLUCOSEU 500 (A) 11/27/2016 2241   HGBUR SMALL (A) 11/27/2016 2241   BILIRUBINUR NEGATIVE 11/27/2016 2241   KETONESUR 40 (A) 11/27/2016 2241   PROTEINUR 100 (A) 11/27/2016 2241   UROBILINOGEN 1.0 06/12/2015 1937   NITRITE NEGATIVE 11/27/2016 2241   LEUKOCYTESUR MODERATE (A) 11/27/2016 2241     RAI,RIPUDEEP M.D. [facility] 11/28/2016, 1:13 PM  Pager: [phone] Between 7am to 7pm - call Pager - [phone]  After 7pm go to [email or web address] - password [email or web address]  Call night coverage person covering after 7pm

## 2016-11-29 DIAGNOSIS — E039 Hypothyroidism, unspecified: Secondary | ICD-10-CM

## 2016-11-29 DIAGNOSIS — I10 Essential (primary) hypertension: Secondary | ICD-10-CM

## 2016-11-29 DIAGNOSIS — E871 Hypo-osmolality and hyponatremia: Secondary | ICD-10-CM

## 2016-11-29 DIAGNOSIS — J449 Chronic obstructive pulmonary disease, unspecified: Secondary | ICD-10-CM

## 2016-11-29 LAB — URINE CULTURE

## 2016-11-29 LAB — GLUCOSE, CAPILLARY
GLUCOSE-CAPILLARY: 297 mg/dL — AB (ref 65–99)
GLUCOSE-CAPILLARY: 185 mg/dL — AB (ref 65–99)
Glucose-Capillary: 340 mg/dL — ABNORMAL HIGH (ref 65–99)
Glucose-Capillary: 178 mg/dL — ABNORMAL HIGH (ref 65–99)

## 2016-11-29 LAB — HEMOGLOBIN A1C
Hgb A1c MFr Bld: 6 % — ABNORMAL HIGH (ref 4.8–5.6)
Mean Plasma Glucose: 126 mg/dL

## 2016-11-29 LAB — CBC
HCT: 30.3 % — ABNORMAL LOW (ref 36.0–46.0)
Hemoglobin: 10 g/dL — ABNORMAL LOW (ref 12.0–15.0)
MCH: 31.4 pg (ref 26.0–34.0)
MCHC: 33 g/dL (ref 30.0–36.0)
MCV: 95.3 fL (ref 78.0–100.0)
PLATELETS: 228 10*3/uL (ref 150–400)
RBC: 3.18 MIL/uL — ABNORMAL LOW (ref 3.87–5.11)
RDW: 14.7 % (ref 11.5–15.5)
WBC: 16.8 10*3/uL — ABNORMAL HIGH (ref 4.0–10.5)

## 2016-11-29 LAB — BASIC METABOLIC PANEL
Anion gap: 16 — ABNORMAL HIGH (ref 5–15)
BUN: 41 mg/dL — ABNORMAL HIGH (ref 6–20)
CALCIUM: 8.4 mg/dL — AB (ref 8.9–10.3)
CO2: 14 mmol/L — AB (ref 22–32)
CREATININE: 1.49 mg/dL — AB (ref 0.44–1.00)
Chloride: 104 mmol/L (ref 101–111)
GFR calc non Af Amer: 32 mL/min — ABNORMAL LOW (ref >60)
GFR, EST AFRICAN AMERICAN: 37 mL/min — AB (ref >60)
GLUCOSE: 337 mg/dL — AB (ref 65–99)
Potassium: 4.7 mmol/L (ref 3.5–5.1)
Sodium: 134 mmol/L — ABNORMAL LOW (ref 135–145)

## 2016-11-29 MED ORDER — METHYLPREDNISOLONE SODIUM SUCC 40 MG IJ SOLR
40.0000 mg | Freq: Two times a day (BID) | INTRAMUSCULAR | Status: DC
  Administered 2016-11-29 – 2016-12-01 (×5): 40 mg via INTRAVENOUS
  Filled 2016-11-29 (×5): qty 1

## 2016-11-29 MED ORDER — HYDRALAZINE HCL 20 MG/ML IJ SOLN
10.0000 mg | Freq: Once | INTRAMUSCULAR | Status: AC
  Administered 2016-11-29: 10 mg via INTRAVENOUS
  Filled 2016-11-29: qty 1

## 2016-11-29 MED ORDER — LEVALBUTEROL HCL 0.63 MG/3ML IN NEBU
0.6300 mg | INHALATION_SOLUTION | Freq: Four times a day (QID) | RESPIRATORY_TRACT | Status: DC | PRN
  Administered 2016-11-29: 0.63 mg via RESPIRATORY_TRACT
  Filled 2016-11-29: qty 3

## 2016-11-29 MED ORDER — PSYLLIUM 95 % PO PACK
1.0000 | PACK | Freq: Two times a day (BID) | ORAL | Status: DC
  Administered 2016-11-29 – 2016-12-01 (×5): 1 via ORAL
  Filled 2016-11-29 (×8): qty 1

## 2016-11-29 MED ORDER — FUROSEMIDE 10 MG/ML IJ SOLN
20.0000 mg | Freq: Once | INTRAMUSCULAR | Status: AC
  Administered 2016-11-29: 20 mg via INTRAVENOUS
  Filled 2016-11-29: qty 2

## 2016-11-29 MED ORDER — POLYETHYLENE GLYCOL 3350 17 G PO PACK
17.0000 g | PACK | Freq: Every day | ORAL | Status: DC
  Administered 2016-11-29 – 2016-12-02 (×3): 17 g via ORAL
  Filled 2016-11-29 (×4): qty 1

## 2016-11-29 MED ORDER — IPRATROPIUM-ALBUTEROL 0.5-2.5 (3) MG/3ML IN SOLN
3.0000 mL | Freq: Three times a day (TID) | RESPIRATORY_TRACT | Status: DC
  Administered 2016-11-29 – 2016-12-02 (×10): 3 mL via RESPIRATORY_TRACT
  Filled 2016-11-29 (×9): qty 3

## 2016-11-29 NOTE — Evaluation (Addendum)
Physical Therapy Evaluation Patient Details Name: Michelle [name] MRN: [id number] DOB: 27-Jan-1932 Today's Date: 11/29/2016   History of Present Illness  Pt is an 83 y/o female admitted from home secondary to confusion and vomitting. PMH including but not limited to dementia, COPD, DM, HTN, hypothyroidism and pacemaker placement in 2017.  Clinical Impression  Pt presented supine in bed with HOB elevated, awake and willing to participate in therapy session. Prior to admission, pt required assistance with ADLs, had a caregiver with her 24/7 and ambulated with use of RW and supervision for safety. However, pt's daughter reports that one of pt's caregivers will no longer be available to assist and the family would like to pursue SNF placement. Pt currently requires mod A with bed mobility and transfers, as well as min A with ambulation using a RW. Pt would continue to benefit from skilled physical therapy services at this time while admitted and after d/c to address her below listed limitations in order to improve her overall safety and independence with functional mobility.      Follow Up Recommendations SNF    Equipment Recommendations  None recommended by PT    Recommendations for Other Services       Precautions / Restrictions Precautions Precautions: Fall Restrictions Weight Bearing Restrictions: No      Mobility  Bed Mobility Overal bed mobility: Needs Assistance Bed Mobility: Supine to Sit     Supine to sit: HOB elevated;Mod assist     General bed mobility comments: pt required increased time, VC'ing for sequencing and mod A with bilateral LEs and use of bed pads to position hips at EOB  Transfers Overall transfer level: Needs assistance Equipment used: Rolling walker (2 wheeled) Transfers: Sit to/from Stand Sit to Stand: Mod assist;+2 safety/equipment         General transfer comment: pt required increased time and mod A to power up into standing from  bed  Ambulation/Gait Ambulation/Gait assistance: Min assist;+2 safety/equipment Ambulation Distance (Feet): 40 Feet Assistive device: Rolling walker (2 wheeled) Gait Pattern/deviations: Step-to pattern;Step-through pattern;Decreased step length - right;Decreased step length - left;Decreased stride length;Trunk flexed Gait velocity: decreased Gait velocity interpretation: Below normal speed for age/gender General Gait Details: pt moderately unsteady with gait and required constant min A for stability. No LOB. Very slow cadence.  Stairs            Wheelchair Mobility    Modified Rankin (Stroke Patients Only)       Balance Overall balance assessment: Needs assistance Sitting-balance support: Feet supported;Bilateral upper extremity supported Sitting balance-Leahy Scale: Poor Sitting balance - Comments: pt maintained UE supports while sitting upright at EOB   Standing balance support: During functional activity;Bilateral upper extremity supported Standing balance-Leahy Scale: Poor Standing balance comment: pt reliant on bilateral UEs on RW                             Pertinent Vitals/Pain Pain Assessment: No/denies pain Faces Pain Scale: No hurt Pain Intervention(s): Monitored during session   During ambulation, pt's HR increased into the mid 120's but quickly recovering into the low 100's upon sitting in recliner. Additionally, pt's SPO2 decreased to as low as 79% with ambulation, but with instruction in deep breathing and sitting rest break increased to 92%.    Home Living Family/patient expects to be discharged to:: Skilled nursing facility Living Arrangements: Alone;Other (Comment) (has caregiver 24/7)  Additional Comments: daughter reports that one of pt's caregivers will no longer be available to work with pt and is therefore seeking out placement in a SNF    Prior Function Level of Independence: Needs assistance   Gait / Transfers  Assistance Needed: per daughter's report, pt ambulates within her home with use of RW and supervision  ADL's / Homemaking Assistance Needed: needs assist with bathing/dressing        Hand Dominance        Extremity/Trunk Assessment   Upper Extremity Assessment Upper Extremity Assessment: Generalized weakness    Lower Extremity Assessment Lower Extremity Assessment: Generalized weakness    Cervical / Trunk Assessment Cervical / Trunk Assessment: Kyphotic  Communication   Communication: No difficulties  Cognition Arousal/Alertness: Awake/alert Behavior During Therapy: WFL for tasks assessed/performed;Flat affect Overall Cognitive Status: History of cognitive impairments - at baseline                      General Comments      Exercises     Assessment/Plan    PT Assessment Patient needs continued PT services  PT Problem List Decreased strength;Decreased activity tolerance;Decreased balance;Decreased mobility;Decreased coordination;Decreased cognition;Decreased knowledge of use of DME;Decreased safety awareness;Cardiopulmonary status limiting activity          PT Treatment Interventions DME instruction;Gait training;Stair training;Functional mobility training;Therapeutic activities;Therapeutic exercise;Balance training;Neuromuscular re-education;Cognitive remediation;Patient/family education    PT Goals (Current goals can be found in the Care Plan section)  Acute Rehab PT Goals Patient Stated Goal: find a SNF for pt to d/c to from [facility] PT Goal Formulation: With patient/family Time For Goal Achievement: 12/13/16 Potential to Achieve Goals: Fair    Frequency Min 3X/week   Barriers to discharge        Co-evaluation               End of Session Equipment Utilized During Treatment: Gait belt Activity Tolerance: Patient limited by fatigue Patient left: in chair;with call bell/phone within reach;with chair alarm set;with family/visitor present Nurse  Communication: Mobility status         Time: [id number] PT Time Calculation (min) (ACUTE ONLY): 23 min   Charges:   PT Evaluation $PT Eval Moderate Complexity: 1 Procedure PT Treatments $Gait Training: 8-22 mins   PT G CodesClearnce Sorrel Mansa Willers 11/29/2016, 10:55 AM Sherie Don, PT, DPT [phone]

## 2016-11-29 NOTE — NC FL2 (Signed)
[place] LEVEL OF CARE SCREENING TOOL     IDENTIFICATION  Patient Name: Michelle [name] Birthdate: 1932/04/11 Sex: female Admission Date (Current Location): 11/27/2016  [facility] and [place] Number:  Herbalist and Address:  [facility] [place], [place] [address] [place], [place] [address]      Provider Number: [id number]  Attending Physician Name and Address:  Mendel Corning, MD  Relative Name and Phone Number:       Current Level of Care: Hospital Recommended Level of Care: [place] Prior Approval Number:    Date Approved/Denied:   PASRR Number:   [id number]   Discharge Plan: SNF    Current Diagnoses: Patient Active Problem List   Diagnosis Date Noted  . Sepsis ([place]) 11/28/2016  . Acute pyelonephritis 11/28/2016  . Hyponatremia 11/28/2016  . Altered mental status   . Aftercare following surgery of the circulatory system 10/28/2016  . Cardiogenic shock ([place])   . Elevated troponin   . Shock liver   . Bradycardia   . Dyspnea   . Hypoglycemia 08/21/2016  . Complete heart block ([place]) 08/21/2016  . Essential hypertension 08/21/2016  . Hyperkalemia   . Alzheimer's dementia 04/23/2016  . Acute kidney injury ([place])   . Type 2 diabetes mellitus with diabetic nephropathy, with long-term current use of insulin ([place])   . HLD (hyperlipidemia) 06/13/2015  . Nausea & vomiting 06/12/2015  . CAD (coronary artery disease) 06/12/2015  . Acute renal failure superimposed on stage 3 chronic kidney disease ([place]) 06/12/2015  . Normocytic anemia 06/12/2015  . Hypothyroidism, acquired   . Symptomatic sinus bradycardia 01/16/2014  . Abnormality of gait 11/24/2013  . Hip pain 11/24/2013  . Knee pain 11/24/2013  . Memory loss 05/23/2013  . HYPERLIPIDEMIA 01/08/2008  . Asthma with COPD ([place]) 01/06/2008  . Polymyalgia rheumatica ([place]) 01/06/2008  . DYSPNEA 12/17/2007    Orientation RESPIRATION BLADDER Height & Weight     Self,  Place  Normal Incontinent Weight: 119 lb 4.3 oz (54.1 kg) Height:  5\' 1"  (154.9 cm)  BEHAVIORAL SYMPTOMS/MOOD NEUROLOGICAL BOWEL NUTRITION STATUS      Continent Diet (Heart Healthy/carb modified )  AMBULATORY STATUS COMMUNICATION OF NEEDS Skin   Limited Assist Verbally Normal                       Personal Care Assistance Level of Assistance  Bathing, Dressing, Feeding Bathing Assistance: Limited assistance Feeding assistance: Limited assistance Dressing Assistance: Limited assistance     Functional Limitations Info             SPECIAL CARE FACTORS FREQUENCY  PT (By licensed PT), OT (By licensed OT)     PT Frequency: 5 OT Frequency: 5            Contractures      Additional Factors Info  Code Status Code Status Info: DNR Allergies Info: NKA           Current Medications (11/29/2016):  This is the current hospital active medication list Current Facility-Administered Medications  Medication Dose Route Frequency Provider Last Rate Last Dose  . acetaminophen (TYLENOL) tablet 650 mg  650 mg Oral Q6H PRN Edwin Dada, MD       Or  . acetaminophen (TYLENOL) suppository 650 mg  650 mg Rectal Q6H PRN Edwin Dada, MD      . aspirin EC tablet 81 mg  81 mg Oral Daily Edwin Dada, MD  81 mg at 11/28/16 0817  . cefTRIAXone (ROCEPHIN) 2 g in dextrose 5 % 50 mL IVPB  2 g Intravenous Q24H Ripudeep K Rai, MD   2 g at 11/29/16 0901  . enoxaparin (LOVENOX) injection 30 mg  30 mg Subcutaneous Q24H Edwin Dada, MD   30 mg at 11/29/16 1034  . fentaNYL (DURAGESIC - dosed mcg/hr) patch 25 mcg  25 mcg Transdermal Q72H Edwin Dada, MD   25 mcg at 11/28/16 0819  . furosemide (LASIX) injection 20 mg  20 mg Intravenous Once Ripudeep K Rai, MD      . guaiFENesin (MUCINEX) 12 hr tablet 1,200 mg  1,200 mg Oral BID Ripudeep Krystal Eaton, MD   1,200 mg at 11/28/16 2136  . insulin aspart (novoLOG) injection 0-15 Units  0-15 Units Subcutaneous TID WC  Ripudeep Krystal Eaton, MD   11 Units at 11/29/16 0901  . insulin aspart (novoLOG) injection 0-5 Units  0-5 Units Subcutaneous QHS Edwin Dada, MD      . insulin aspart (novoLOG) injection 3 Units  3 Units Subcutaneous TID WC Ripudeep Krystal Eaton, MD   3 Units at 11/29/16 0902  . insulin glargine (LANTUS) injection 10 Units  10 Units Subcutaneous QHS Ripudeep Krystal Eaton, MD   10 Units at 11/28/16 2235  . ipratropium-albuterol (DUONEB) 0.5-2.5 (3) MG/3ML nebulizer solution 3 mL  3 mL Nebulization TID Ripudeep Krystal Eaton, MD   3 mL at 11/29/16 0908  . levalbuterol (XOPENEX) nebulizer solution 0.63 mg  0.63 mg Nebulization Q6H PRN Ritta Slot, NP   0.63 mg at 11/29/16 [id number]  . levothyroxine (SYNTHROID, LEVOTHROID) tablet 100 mcg  100 mcg Oral QAC breakfast Edwin Dada, MD   100 mcg at 11/29/16 0902  . LORazepam (ATIVAN) tablet 1 mg  1 mg Oral Q8H PRN Ripudeep K Rai, MD      . methylPREDNISolone sodium succinate (SOLU-MEDROL) 125 mg/2 mL injection 60 mg  60 mg Intravenous Q12H Ripudeep Krystal Eaton, MD   60 mg at 11/29/16 0903  . ondansetron (ZOFRAN) tablet 4 mg  4 mg Oral Q6H PRN Edwin Dada, MD       Or  . ondansetron (ZOFRAN) injection 4 mg  4 mg Intravenous Q6H PRN Edwin Dada, MD      . polyethylene glycol (MIRALAX / GLYCOLAX) packet 17 g  17 g Oral Daily Ripudeep K Rai, MD      . psyllium (HYDROCIL/METAMUCIL) packet 1 packet  1 packet Oral BID Ripudeep K Rai, MD      . sodium chloride flush (NS) 0.9 % injection 3 mL  3 mL Intravenous Q12H Edwin Dada, MD   3 mL at 11/28/16 2236  . [START ON 11/30/2016] vancomycin (VANCOCIN) IVPB 750 mg/150 ml premix  750 mg Intravenous Q48H Reginia Naas, [facility]         Discharge Medications: Please see discharge summary for a list of discharge medications.  Relevant Imaging Results:  Relevant Lab Results:   Additional Information SS#:[id number]  Weston Anna, LCSW

## 2016-11-29 NOTE — Evaluation (Signed)
Clinical/Bedside Swallow Evaluation Patient Details  Name: Michelle [name] MRN: [id number] Date of Birth: [birth year]  Today's Date: 11/29/2016 Time: SLP Start Time (ACUTE ONLY): 75 SLP Stop Time (ACUTE ONLY): 1032 SLP Time Calculation (min) (ACUTE ONLY): 26 min  Past Medical History:  Past Medical History:  Diagnosis Date  . Arthritis   . Asthma   . COPD (chronic obstructive pulmonary disease) ([place])   . Coronary atherosclerosis   . Dyslipidemia   . Gallstones   . History of fractured kneecap   . Hypothyroidism   . Interstitial cystitis   . Malaise and fatigue   . Memory loss   . Polymyalgia rheumatica ([place])   . Systemic hypertension   . Type II or unspecified type diabetes mellitus without mention of complication, not stated as uncontrolled   . UTI (lower urinary tract infection)    Past Surgical History:  Past Surgical History:  Procedure Laterality Date  . CARDIAC CATHETERIZATION  01/29/2006   10% luminal irregularity mid LAD  . CARDIAC CATHETERIZATION  03/15/2001   No significant CAD, no evidence of renal artery stenosis, systemic hypertenion  . CARDIAC CATHETERIZATION N/A 08/21/2016   Procedure: Temporary Pacemaker;  Surgeon: Jettie Booze, MD;  Location: [facility];  Service: Cardiovascular;  Laterality: N/A;  . CARDIAC CATHETERIZATION N/A 09/28/2016   Procedure: Temporary Pacemaker;  Surgeon: Burnell Blanks, MD;  Location: [facility];  Service: Cardiovascular;  Laterality: N/A;  . CERVICAL SPINE SURGERY    . CHOLECYSTECTOMY    . EP IMPLANTABLE DEVICE N/A 09/29/2016   Procedure: Pacemaker Implant;  Surgeon: Evans Lance, MD;  Location: [facility];  Service: Cardiovascular;  Laterality: N/A;  . HERNIA REPAIR    . LUMBAR DISC SURGERY    . NM MYOCAR PERF WALL MOTION  08/17/2009   normal  . ROTATOR CUFF REPAIR    . TONSILLECTOMY    . TOTAL ABDOMINAL HYSTERECTOMY    . US ECHOCARDIOGRAPHY  09/14/2007   mild LVH, LA mildly  dilated,mild mitral annular calcification, AOV mildly sclerotic, aortic root sclerosis/ca+   HPI:  Michelle [name] a 83 y.o.femalewith a past medical history significant for Alzheimers dementia with known history of conduction system disease, sinus node disease, COPD, PMR on pred 5, hypothyroidism, IDDM and chronic back pain on fentanyl patch 10mcgwho presents with confusion, vomiting and fecal incontinence for 1 day. The patient started to vomit and have loose stools a day or two ago.She had a recent UTI (PCP diagnosed by phone based on foul-smelling urine), culture grew enterococcus, treated with amoxicillin for 5 days. Chest X ray with no evidence of acute cardiopulmonary disease. She has been seen by SLP during prior admissions in May and Nov. 2017. Swallowing deficits noted included oral holding likely 2/2 dementia. She was discharged on dysphagia 2/thin liquids during last admission.   Assessment / Plan / Recommendation Clinical Impression  Patient presents with sensory and cognitive deficits secondary to dementia and sepsis which are impacting oropharyngeal swallowing function. Patient is alert, oriented to self, provides basic biographical information and is able to follow simple commands. Oral motor examination is unremarkable. She tolerates ice chips and thin liquids via cup and straw with no overt signs of aspiration and swallow appears timely. Noted increased respiratory rate and work of breathing with multiple consecutive sips of liquid. These symptoms reduce with clinician assistance to reduce rate of intake and bolus size. When presented with puree via teaspoon, patient is noted with prolonged oral  holding and initiation of swallow is absent. Patient expectorated puree upon command. During subsequent trial of pureed, swallow initiation is again absent despite max verbal and tactile cues. Noted respiratory incoordination, suspect posterior leakage with immediate cough response and elevated  respirations. At this time recommend full liquid diet, medications via alternative means pending improvement in swallowing function. Recommend full assistance with feeding in order to ensure single sips; monitor respiratory status during feeding. SLP will follow up to determine appropriateness for upgrade.    Aspiration Risk  Moderate aspiration risk    Diet Recommendation Thin liquid (full liquids)   Liquid Administration via: Cup Medication Administration: Via alternative means Supervision: Full supervision/cueing for compensatory strategies;Staff to assist with self feeding Compensations: Small sips/bites;Slow rate;Minimize environmental distractions Postural Changes: Seated upright at 90 degrees    Other  Recommendations Oral Care Recommendations: Oral care QID Other Recommendations: Have oral suction available   Follow up Recommendations Skilled Nursing facility      Frequency and Duration min 2x/week  2 weeks       Prognosis Prognosis for Safe Diet Advancement: Good Barriers to Reach Goals: Cognitive deficits      Swallow Study   General Date of Onset: 11/27/16 HPI: Michelle [name] a 83 y.o.femalewith a past medical history significant for Alzheimers dementia with known history of conduction system disease, sinus node disease, COPD, PMR on pred 5, hypothyroidism, IDDM and chronic back pain on fentanyl patch 11mcgwho presents with confusion, vomiting and fecal incontinence for 1 day. The patient started to vomit and have loose stools a day or two ago.She had a recent UTI (PCP diagnosed by phone based on foul-smelling urine), culture grew enterococcus, treated with amoxicillin for 5 days. Chest X ray with no evidence of acute cardiopulmonary disease. She has been seen by SLP during prior admissions in May and Nov. 2017. Swallowing deficits noted included oral holding likely 2/2 dementia. She was discharged on dysphagia 2/thin liquids during last admission. Type of Study:  Bedside Swallow Evaluation Previous Swallow Assessment: See HPI Diet Prior to this Study: Dysphagia 3 (soft);Thin liquids Temperature Spikes Noted: No Respiratory Status: Nasal cannula History of Recent Intubation: No Behavior/Cognition: Alert;Confused;Cooperative;Pleasant mood Oral Cavity Assessment: Dry Oral Care Completed by SLP: No Oral Cavity - Dentition: Adequate natural dentition Vision: Functional for self-feeding Self-Feeding Abilities: Needs assist Patient Positioning: Upright in chair Baseline Vocal Quality: Normal Volitional Cough: Weak Volitional Swallow: Unable to elicit    Oral/Motor/Sensory Function Overall Oral Motor/Sensory Function: Within functional limits   Ice Chips Ice chips: Within functional limits Presentation: Spoon   Thin Liquid Thin Liquid: Within functional limits Presentation: Spoon;Cup;Straw    Nectar Thick Nectar Thick Liquid: Not tested   Honey Thick Honey Thick Liquid: Not tested   Puree Puree: Impaired Presentation: Spoon;Self Fed Oral Phase Impairments: Poor awareness of bolus Oral Phase Functional Implications: Oral holding Pharyngeal Phase Impairments: Unable to trigger swallow;Cough - Delayed;Change in Vital Signs   Solid   GO   Solid: Not tested        Aliene Altes 11/29/2016,11:03 AM  Deneise Lever, MS CF-SLP Speech-Language Pathologist [phone]

## 2016-11-29 NOTE — Progress Notes (Signed)
[facility]                                                                              Patient Demographics  Michelle [name], is a 83 y.o. female, DOB - 08/10/1932, [id number]  Admit date - 11/27/2016   Admitting Physician Edwin Dada, MD  Outpatient Primary MD for the patient is [name], MD  Outpatient specialists:   LOS - 1  days    Chief Complaint  Patient presents with  . Nausea  . Emesis  . Diarrhea       Brief summary   Patient is a 83 year old female with dementia, COPD, PMR on prednisone, hypothyroidism, diabetes presented with confusion, vomiting and fecal incontinence.She had a recent UTI (PCP diagnosed by phone based on foul-smelling urine), culture grew enterococcus, treated with amoxicillin for 5 days.  The patient endorses dysuria.    Assessment & Plan    Principal Problem:  Sepsis, suspected from UTI: Recent enterococcus UTI - Urine culture however showed 20,000 colonies of yeast, will finish off IV Rocephin for 3 days.  - reviewed prior urine culture results, placed back on IV vancomycin until urine culture and sensitivities are back - Influenza swab negative - DC IV fluids, bibasilar crackles, will give Lasix 20 mg IV 1  COPD exacerbation, aspiration pneumonitis - Coarse breath sounds during lung exam - Placed on IV Solu-Medrol, duo nebs, Mucinex - Likely also has some degree of aspiration, SLP evaluation - will benefit from Augmentin at the time of discharge and aspiration precautions   Hyponatremia:  Mild, in setting of sepsis. -Improving, DC IV fluids   Anemia:  Chronic, normocytic.  At baseline.   Insulin dependent diabetes:  with hyperglycemia scan due to steroids -Increase Lantus, and meal coverage, increase sliding scale to moderate  PMR:  -Hold prednisone Continue IV Solu-Medrol  Chronic back pain:  -Continue fentanyl patch  Hypertension and coronary secondary prevention:  -Continue  aspirin -Hold all antihypertensives  Hypothyroidism: -Continue levothyroxine  Code Status: DNR DVT Prophylaxis:  Lovenox Family Communication: Discussed in detail with the patient, all imaging results, lab results explained to the patient and daughter   Disposition Plan: Skilled nursing facility Time Spent in minutes   25 minutes  Procedures:  None   Consultants:   none   Antimicrobials:      Medications  Scheduled Meds: . aspirin EC  81 mg Oral Daily  . cefTRIAXone (ROCEPHIN)  IV  2 g Intravenous Q24H  . enoxaparin (LOVENOX) injection  30 mg Subcutaneous Q24H  . fentaNYL  25 mcg Transdermal Q72H  . guaiFENesin  1,200 mg Oral BID  . insulin aspart  0-15 Units Subcutaneous TID WC  . insulin aspart  0-5 Units Subcutaneous QHS  . insulin aspart  3 Units Subcutaneous TID WC  . insulin glargine  10 Units Subcutaneous QHS  . ipratropium-albuterol  3 mL Nebulization TID  . levothyroxine  100 mcg Oral QAC breakfast  . methylPREDNISolone (SOLU-MEDROL) injection  60 mg Intravenous Q12H  . polyethylene glycol  17 g Oral Daily  . psyllium  1 packet Oral BID  . sodium chloride flush  3  mL Intravenous Q12H  . [START ON 11/30/2016] vancomycin  750 mg Intravenous Q48H   Continuous Infusions:  PRN Meds:.acetaminophen **OR** acetaminophen, levalbuterol, LORazepam, ondansetron **OR** ondansetron (ZOFRAN) IV   Antibiotics   Anti-infectives    Start     Dose/Rate Route Frequency Ordered Stop   11/30/16 0800  vancomycin (VANCOCIN) IVPB 750 mg/150 ml premix     750 mg 150 mL/hr over 60 Minutes Intravenous Every 48 hours 11/28/16 1329     11/28/16 2200  vancomycin (VANCOCIN) 500 mg in sodium chloride 0.9 % 100 mL IVPB  Status:  Discontinued     500 mg 100 mL/hr over 60 Minutes Intravenous Every 24 hours 11/28/16 0212 11/28/16 0917   11/28/16 0930  cefTRIAXone (ROCEPHIN) 1 g in dextrose 5 % 50 mL IVPB  Status:  Discontinued     1 g 100 mL/hr over 30 Minutes Intravenous Every 24  hours 11/28/16 0917 11/28/16 0917   11/28/16 0930  cefTRIAXone (ROCEPHIN) 2 g in dextrose 5 % 50 mL IVPB     2 g 100 mL/hr over 30 Minutes Intravenous Every 24 hours 11/28/16 0917     11/28/16 0600  piperacillin-tazobactam (ZOSYN) IVPB 3.375 g  Status:  Discontinued     3.375 g 12.5 mL/hr over 240 Minutes Intravenous Every 8 hours 11/28/16 0212 11/28/16 0917   11/28/16 0045  piperacillin-tazobactam (ZOSYN) IVPB 3.375 g     3.375 g 100 mL/hr over 30 Minutes Intravenous  Once 11/28/16 0033 11/28/16 0141   11/28/16 0045  vancomycin (VANCOCIN) IVPB 1000 mg/200 mL premix     1,000 mg 200 mL/hr over 60 Minutes Intravenous  Once 11/28/16 0033 11/28/16 0200        Subjective:   Michelle [name] was seen and examined today. Working with physical therapy, very weak and frail. No fevers or chills. Patient denies dizziness, chest pain,  abdominal pain, N/V/D/C, new weakness, numbess, tingling. No acute events overnight.   Objective:   Vitals:   11/29/16 0420 11/29/16 0700 11/29/16 0915 11/29/16 1200  BP: (!) 162/89 (!) 149/89    Pulse: (!) 102 (!) 107  94  Resp: (!) 26 (!) 24    Temp: 97.7 F (36.5 C) 98.9 F (37.2 C)  98.1 F (36.7 C)  TempSrc: Oral Axillary  Axillary  SpO2: 96% 96% 96%   Weight:      Height:        Intake/Output Summary (Last 24 hours) at 11/29/16 1303 Last data filed at 11/29/16 1036  Gross per 24 hour  Intake             2103 ml  Output                0 ml  Net             2103 ml     Wt Readings from Last 3 Encounters:  11/28/16 54.1 kg (119 lb 4.3 oz)  10/20/16 54.1 kg (119 lb 4.8 oz)  09/28/16 54.4 kg (119 lb 14.9 oz)     Exam  General: Alert and oriented x 2, NAD, Frail and weak  HEENT:    Neck: Supple, no JVD, no masses  Cardiovascular: S1 S2 clear, RRR  Respiratory: Bibasilar crackles  Gastrointestinal: Soft, nontender, nondistended, + bowel sounds  Ext: no cyanosis clubbing or edema  Neuro: AAOx3, Cr N's II- XII. Strength 5/5 upper  and lower extremities bilaterally  Skin: No rashes  Psych: Normal affect and demeanor, alert and oriented x2  Data Reviewed:  I have personally reviewed following labs and imaging studies  Micro Results Recent Results (from the past 240 hour(s))  Blood Culture (routine x 2)     Status: None (Preliminary result)   Collection Time: 11/27/16  7:45 PM  Result Value Ref Range Status   Specimen Description BLOOD RIGHT ANTECUBITAL  Final   Special Requests BOTTLES DRAWN AEROBIC AND ANAEROBIC 5CC  Final   Culture NO GROWTH 2 DAYS  Final   Report Status PENDING  Incomplete  Blood Culture (routine x 2)     Status: None (Preliminary result)   Collection Time: 11/27/16  7:51 PM  Result Value Ref Range Status   Specimen Description BLOOD RIGHT WRIST  Final   Special Requests IN PEDIATRIC BOTTLE 4CC  Final   Culture NO GROWTH 2 DAYS  Final   Report Status PENDING  Incomplete  Urine culture     Status: Abnormal   Collection Time: 11/27/16 10:41 PM  Result Value Ref Range Status   Specimen Description URINE, CATHETERIZED  Final   Special Requests NONE  Final   Culture 20,000 COLONIES/mL YEAST (A)  Final   Report Status 11/29/2016 FINAL  Final  MRSA PCR Screening     Status: None   Collection Time: 11/28/16  2:22 AM  Result Value Ref Range Status   MRSA by PCR NEGATIVE NEGATIVE Final    Comment:        The GeneXpert MRSA Assay (FDA approved for NASAL specimens only), is one component of a comprehensive MRSA colonization surveillance program. It is not intended to diagnose MRSA infection nor to guide or monitor treatment for MRSA infections.     Radiology Reports Dg Chest 2 View  Result Date: 11/27/2016 CLINICAL DATA:  83 year old female with history of nausea, vomiting and diarrhea. EXAM: CHEST  2 VIEW COMPARISON:  Chest x-ray 10/01/2016. FINDINGS: Lung volumes are low. No consolidative airspace disease. No pleural effusions. No pneumothorax. No pulmonary nodule or mass noted.  Pulmonary vasculature and the cardiomediastinal silhouette are within normal limits. Aortic atherosclerosis. Left-sided pacemaker device in place with lead tips projecting over the expected location of the right atrium and right ventricular apex. Orthopedic fixation hardware in the lower cervical spine incidentally noted. IMPRESSION: 1. Low lung volumes without radiographic evidence of acute cardiopulmonary disease. 2. Aortic atherosclerosis. Electronically Signed   By: Vinnie Langton M.D.   On: 11/27/2016 19:21   Ct Abdomen Pelvis W Contrast  Result Date: 11/28/2016 CLINICAL DATA:  Altered mental status vomiting EXAM: CT ABDOMEN AND PELVIS WITH CONTRAST TECHNIQUE: Multidetector CT imaging of the abdomen and pelvis was performed using the standard protocol following bolus administration of intravenous contrast. CONTRAST:  127mL ISOVUE-300 IOPAMIDOL (ISOVUE-300) INJECTION 61% COMPARISON:  None. FINDINGS: Lower chest: Lung bases demonstrate patchy dependent atelectasis at the left base. No pleural effusion. Cardiomegaly is present. Partially visualized pacer leads. Dense mitral calcifications. Hepatobiliary: No focal hepatic abnormality. Surgical clips in the gallbladder fossa. No biliary dilatation Pancreas: Unremarkable. No pancreatic ductal dilatation or surrounding inflammatory changes. Spleen: Normal in size without focal abnormality. Adrenals/Urinary Tract: Adrenal glands within normal limits. Kidneys show no hydronephrosis. The bladder is enlarged but otherwise unremarkable Stomach/Bowel: The stomach is nonenlarged. There is no dilated small bowel. No colon wall thickening. Appendix normal. Vascular/Lymphatic: Moderate atherosclerotic vascular calcifications of the aorta and branch vessels. No grossly enlarged lymph nodes Reproductive: Status post hysterectomy. No adnexal masses. Other: No free air or free fluid.  Diffuse subcutaneous edema. Musculoskeletal: Degenerative changes of  the spine with minimal  retrolisthesis of L3 on L4. Minimal anterolisthesis of L4 on L5. Prominent Schmorl's node at L4 superiorly. IMPRESSION: No CT evidence for a bowel obstruction or other acute intra-abdominal or pelvic pathology Electronically Signed   By: Donavan Foil M.D.   On: 11/28/2016 00:00    Lab Data:  CBC:  Recent Labs Lab 11/27/16 1945 11/28/16 0243 11/29/16 0532  WBC 13.2* 13.9* 16.8*  NEUTROABS 11.5*  --   --   HGB 10.8* 9.6* 10.0*  HCT 32.3* 29.0* 30.3*  MCV 95.3 97.0 95.3  PLT 244 226 [id number]   Basic Metabolic Panel:  Recent Labs Lab 11/27/16 1945 11/28/16 0243 11/29/16 0532  NA 134* 133* 134*  K 4.7 4.3 4.7  CL 96* 99* 104  CO2 20* 14* 14*  GLUCOSE 292* 447* 337*  BUN 33* 38* 41*  CREATININE 1.18* 1.87* 1.49*  CALCIUM 9.3 8.5* 8.4*   GFR: Estimated Creatinine Clearance: 22 mL/min (by C-G formula based on SCr of 1.49 mg/dL (H)). Liver Function Tests:  Recent Labs Lab 11/27/16 1945 11/28/16 0243  AST 36 42*  ALT 15 19  ALKPHOS 62 59  BILITOT 1.1 1.6*  PROT 6.1* 5.7*  ALBUMIN 3.6 3.4*    Recent Labs Lab 11/27/16 1945  LIPASE 11   No results for input(s): AMMONIA in the last 168 hours. Coagulation Profile: No results for input(s): INR, PROTIME in the last 168 hours. Cardiac Enzymes: No results for input(s): CKTOTAL, CKMB, CKMBINDEX, TROPONINI in the last 168 hours. BNP (last 3 results) No results for input(s): PROBNP in the last 8760 hours. HbA1C:  Recent Labs  11/28/16 0243  HGBA1C 6.0*   CBG:  Recent Labs Lab 11/28/16 1348 11/28/16 1742 11/28/16 2224 11/29/16 0744 11/29/16 1241  GLUCAP 387* 256* 143* 340* 297*   Lipid Profile: No results for input(s): CHOL, HDL, LDLCALC, TRIG, CHOLHDL, LDLDIRECT in the last 72 hours. Thyroid Function Tests: No results for input(s): TSH, T4TOTAL, FREET4, T3FREE, THYROIDAB in the last 72 hours. Anemia Panel: No results for input(s): VITAMINB12, FOLATE, FERRITIN, TIBC, IRON, RETICCTPCT in the last 72  hours. Urine analysis:    Component Value Date/Time   COLORURINE YELLOW 11/27/2016 2241   APPEARANCEUR CLOUDY (A) 11/27/2016 2241   LABSPEC 1.015 11/27/2016 2241   PHURINE 6.0 11/27/2016 2241   GLUCOSEU 500 (A) 11/27/2016 2241   HGBUR SMALL (A) 11/27/2016 2241   BILIRUBINUR NEGATIVE 11/27/2016 2241   KETONESUR 40 (A) 11/27/2016 2241   PROTEINUR 100 (A) 11/27/2016 2241   UROBILINOGEN 1.0 06/12/2015 1937   NITRITE NEGATIVE 11/27/2016 2241   LEUKOCYTESUR MODERATE (A) 11/27/2016 2241     Manish Ruggiero M.D. [facility] 11/29/2016, 1:03 PM  Pager: [phone] Between 7am to 7pm - call Pager - [phone]  After 7pm go to [email or web address] - password [email or web address]  Call night coverage person covering after 7pm

## 2016-11-30 LAB — BASIC METABOLIC PANEL
Anion gap: 9 (ref 5–15)
BUN: 39 mg/dL — ABNORMAL HIGH (ref 6–20)
CALCIUM: 8.4 mg/dL — AB (ref 8.9–10.3)
CO2: 21 mmol/L — AB (ref 22–32)
CREATININE: 1.14 mg/dL — AB (ref 0.44–1.00)
Chloride: 103 mmol/L (ref 101–111)
GFR calc non Af Amer: 44 mL/min — ABNORMAL LOW (ref >60)
GFR, EST AFRICAN AMERICAN: 50 mL/min — AB (ref >60)
Glucose, Bld: 194 mg/dL — ABNORMAL HIGH (ref 65–99)
Potassium: 4.2 mmol/L (ref 3.5–5.1)
Sodium: 133 mmol/L — ABNORMAL LOW (ref 135–145)

## 2016-11-30 LAB — CBC
HCT: 29.8 % — ABNORMAL LOW (ref 36.0–46.0)
Hemoglobin: 9.8 g/dL — ABNORMAL LOW (ref 12.0–15.0)
MCH: 30.7 pg (ref 26.0–34.0)
MCHC: 32.9 g/dL (ref 30.0–36.0)
MCV: 93.4 fL (ref 78.0–100.0)
PLATELETS: 193 10*3/uL (ref 150–400)
RBC: 3.19 MIL/uL — ABNORMAL LOW (ref 3.87–5.11)
RDW: 14.4 % (ref 11.5–15.5)
WBC: 11.6 10*3/uL — ABNORMAL HIGH (ref 4.0–10.5)

## 2016-11-30 LAB — GLUCOSE, CAPILLARY
GLUCOSE-CAPILLARY: 171 mg/dL — AB (ref 65–99)
Glucose-Capillary: 91 mg/dL (ref 65–99)
Glucose-Capillary: 140 mg/dL — ABNORMAL HIGH (ref 65–99)
Glucose-Capillary: 164 mg/dL — ABNORMAL HIGH (ref 65–99)

## 2016-11-30 MED ORDER — ENSURE ENLIVE PO LIQD
237.0000 mL | Freq: Two times a day (BID) | ORAL | Status: DC
  Administered 2016-12-01 – 2016-12-02 (×2): 237 mL via ORAL

## 2016-11-30 NOTE — Plan of Care (Signed)
Problem: Safety: Goal: Ability to remain free from injury will improve Outcome: Progressing Pt sitting on a chair, call light within reach, chair alarm on, pt to call nursing staff for any help

## 2016-11-30 NOTE — Progress Notes (Signed)
[facility]                                                                              Patient Demographics  Michelle [name], is a 83 y.o. female, DOB - May 27, 1932, [id number]  Admit date - 11/27/2016   Admitting Physician Edwin Dada, MD  Outpatient Primary MD for the patient is [name], MD  Outpatient specialists:   LOS - 2  days    Chief Complaint  Patient presents with  . Nausea  . Emesis  . Diarrhea       Brief summary   Patient is a 83 year old female with dementia, COPD, PMR on prednisone, hypothyroidism, diabetes presented with confusion, vomiting and fecal incontinence.She had a recent UTI (PCP diagnosed by phone based on foul-smelling urine), culture grew enterococcus, treated with amoxicillin for 5 days.  The patient endorses dysuria.    Assessment & Plan    Principal Problem:  Sepsis, suspected from UTI: Recent enterococcus UTI - Urine culture however showed 20,000 colonies of yeast, will finish off IV Rocephin for 3 days.  - reviewed prior urine culture results, placed back on IV vancomycin  - Influenza swab negative   COPD exacerbation, aspiration pneumonitis - Placed on IV Solu-Medrol, duo nebs, Mucinex - Swallow evaluation done with moderate degree of aspiration - transition to Augmentin at the time of discharge and aspiration precautions   Hyponatremia:  Mild, in setting of sepsis. -Improving, DC IV fluids   Anemia:  Chronic, normocytic.  At baseline.   Insulin dependent diabetes:  with hyperglycemia scan due to steroids -Increase Lantus, and meal coverage, increase sliding scale to moderate  PMR:  -Hold prednisone Continue IV Solu-Medrol  Chronic back pain:  -Continue fentanyl patch  Hypertension and coronary secondary prevention:  -Continue aspirin -Hold all antihypertensives  Hypothyroidism: -Continue levothyroxine  Code Status: DNR DVT Prophylaxis:  Lovenox Family Communication:  Discussed in detail with the patient, all imaging results, lab results explained to the patient and daughter   Disposition Plan: Skilled nursing facility Time Spent in minutes   25 minutes  Procedures:  None   Consultants:   none   Antimicrobials:      Medications  Scheduled Meds: . aspirin EC  81 mg Oral Daily  . cefTRIAXone (ROCEPHIN)  IV  2 g Intravenous Q24H  . enoxaparin (LOVENOX) injection  30 mg Subcutaneous Q24H  . feeding supplement (ENSURE ENLIVE)  237 mL Oral BID BM  . fentaNYL  25 mcg Transdermal Q72H  . guaiFENesin  1,200 mg Oral BID  . insulin aspart  0-15 Units Subcutaneous TID WC  . insulin aspart  0-5 Units Subcutaneous QHS  . insulin aspart  3 Units Subcutaneous TID WC  . insulin glargine  10 Units Subcutaneous QHS  . ipratropium-albuterol  3 mL Nebulization TID  . levothyroxine  100 mcg Oral QAC breakfast  . methylPREDNISolone (SOLU-MEDROL) injection  40 mg Intravenous Q12H  . polyethylene glycol  17 g Oral Daily  . psyllium  1 packet Oral BID  . sodium chloride flush  3 mL Intravenous Q12H  . vancomycin  750 mg Intravenous Q48H   Continuous Infusions:  PRN Meds:.acetaminophen **OR** acetaminophen, levalbuterol, LORazepam, ondansetron **OR** ondansetron (ZOFRAN) IV   Antibiotics   Anti-infectives    Start     Dose/Rate Route Frequency Ordered Stop   11/30/16 0800  vancomycin (VANCOCIN) IVPB 750 mg/150 ml premix     750 mg 150 mL/hr over 60 Minutes Intravenous Every 48 hours 11/28/16 1329     11/28/16 2200  vancomycin (VANCOCIN) 500 mg in sodium chloride 0.9 % 100 mL IVPB  Status:  Discontinued     500 mg 100 mL/hr over 60 Minutes Intravenous Every 24 hours 11/28/16 0212 11/28/16 0917   11/28/16 0930  cefTRIAXone (ROCEPHIN) 1 g in dextrose 5 % 50 mL IVPB  Status:  Discontinued     1 g 100 mL/hr over 30 Minutes Intravenous Every 24 hours 11/28/16 0917 11/28/16 0917   11/28/16 0930  cefTRIAXone (ROCEPHIN) 2 g in dextrose 5 % 50 mL IVPB     2  g 100 mL/hr over 30 Minutes Intravenous Every 24 hours 11/28/16 0917     11/28/16 0600  piperacillin-tazobactam (ZOSYN) IVPB 3.375 g  Status:  Discontinued     3.375 g 12.5 mL/hr over 240 Minutes Intravenous Every 8 hours 11/28/16 0212 11/28/16 0917   11/28/16 0045  piperacillin-tazobactam (ZOSYN) IVPB 3.375 g     3.375 g 100 mL/hr over 30 Minutes Intravenous  Once 11/28/16 0033 11/28/16 0141   11/28/16 0045  vancomycin (VANCOCIN) IVPB 1000 mg/200 mL premix     1,000 mg 200 mL/hr over 60 Minutes Intravenous  Once 11/28/16 0033 11/28/16 0200        Subjective:   Michelle [name] was seen and examined today. States does not feel too good today although cannot elaborate any more. No fevers or chills. Feels nauseous today. Patient denies dizziness, chest pain,  abdominal pain, new weakness, numbess, tingling. No acute events overnight.   Objective:   Vitals:   11/30/16 0356 11/30/16 0816 11/30/16 0846 11/30/16 1200  BP: (!) 161/109 (!) 148/98  (!) 147/104  Pulse: 99 100  95  Resp: 19 (!) 27  19  Temp: 98.6 F (37 C) 97.3 F (36.3 C)  98.4 F (36.9 C)  TempSrc: Oral Oral  Oral  SpO2: 93% 94% 94% 96%  Weight:      Height:        Intake/Output Summary (Last 24 hours) at 11/30/16 1443 Last data filed at 11/30/16 0945  Gross per 24 hour  Intake              200 ml  Output                0 ml  Net              200 ml     Wt Readings from Last 3 Encounters:  11/28/16 54.1 kg (119 lb 4.3 oz)  10/20/16 54.1 kg (119 lb 4.8 oz)  09/28/16 54.4 kg (119 lb 14.9 oz)     Exam  General: Alert and oriented x 2, NAD, Frail and weak  HEENT:    Neck: Supple, no JVD, no masses  Cardiovascular: S1 S2 clear, RRR  Respiratory: Decreased breath sounds at the bases otherwise fairly clear  Gastrointestinal: Soft, nontender, nondistended, + bowel sounds  Ext: no cyanosis clubbing or edema  Neuro: no new deficits  Skin: No rashes  Psych: Normal affect and demeanor, alert and  oriented x2   Data Reviewed:  I have personally reviewed following labs and imaging studies  Micro  Results Recent Results (from the past 240 hour(s))  Blood Culture (routine x 2)     Status: None (Preliminary result)   Collection Time: 11/27/16  7:45 PM  Result Value Ref Range Status   Specimen Description BLOOD RIGHT ANTECUBITAL  Final   Special Requests BOTTLES DRAWN AEROBIC AND ANAEROBIC 5CC  Final   Culture NO GROWTH 3 DAYS  Final   Report Status PENDING  Incomplete  Blood Culture (routine x 2)     Status: None (Preliminary result)   Collection Time: 11/27/16  7:51 PM  Result Value Ref Range Status   Specimen Description BLOOD RIGHT WRIST  Final   Special Requests IN PEDIATRIC BOTTLE 4CC  Final   Culture NO GROWTH 3 DAYS  Final   Report Status PENDING  Incomplete  Urine culture     Status: Abnormal   Collection Time: 11/27/16 10:41 PM  Result Value Ref Range Status   Specimen Description URINE, CATHETERIZED  Final   Special Requests NONE  Final   Culture 20,000 COLONIES/mL YEAST (A)  Final   Report Status 11/29/2016 FINAL  Final  MRSA PCR Screening     Status: None   Collection Time: 11/28/16  2:22 AM  Result Value Ref Range Status   MRSA by PCR NEGATIVE NEGATIVE Final    Comment:        The GeneXpert MRSA Assay (FDA approved for NASAL specimens only), is one component of a comprehensive MRSA colonization surveillance program. It is not intended to diagnose MRSA infection nor to guide or monitor treatment for MRSA infections.     Radiology Reports Dg Chest 2 View  Result Date: 11/27/2016 CLINICAL DATA:  83 year old female with history of nausea, vomiting and diarrhea. EXAM: CHEST  2 VIEW COMPARISON:  Chest x-ray 10/01/2016. FINDINGS: Lung volumes are low. No consolidative airspace disease. No pleural effusions. No pneumothorax. No pulmonary nodule or mass noted. Pulmonary vasculature and the cardiomediastinal silhouette are within normal limits. Aortic  atherosclerosis. Left-sided pacemaker device in place with lead tips projecting over the expected location of the right atrium and right ventricular apex. Orthopedic fixation hardware in the lower cervical spine incidentally noted. IMPRESSION: 1. Low lung volumes without radiographic evidence of acute cardiopulmonary disease. 2. Aortic atherosclerosis. Electronically Signed   By: Vinnie Langton M.D.   On: 11/27/2016 19:21   Ct Abdomen Pelvis W Contrast  Result Date: 11/28/2016 CLINICAL DATA:  Altered mental status vomiting EXAM: CT ABDOMEN AND PELVIS WITH CONTRAST TECHNIQUE: Multidetector CT imaging of the abdomen and pelvis was performed using the standard protocol following bolus administration of intravenous contrast. CONTRAST:  185mL ISOVUE-300 IOPAMIDOL (ISOVUE-300) INJECTION 61% COMPARISON:  None. FINDINGS: Lower chest: Lung bases demonstrate patchy dependent atelectasis at the left base. No pleural effusion. Cardiomegaly is present. Partially visualized pacer leads. Dense mitral calcifications. Hepatobiliary: No focal hepatic abnormality. Surgical clips in the gallbladder fossa. No biliary dilatation Pancreas: Unremarkable. No pancreatic ductal dilatation or surrounding inflammatory changes. Spleen: Normal in size without focal abnormality. Adrenals/Urinary Tract: Adrenal glands within normal limits. Kidneys show no hydronephrosis. The bladder is enlarged but otherwise unremarkable Stomach/Bowel: The stomach is nonenlarged. There is no dilated small bowel. No colon wall thickening. Appendix normal. Vascular/Lymphatic: Moderate atherosclerotic vascular calcifications of the aorta and branch vessels. No grossly enlarged lymph nodes Reproductive: Status post hysterectomy. No adnexal masses. Other: No free air or free fluid.  Diffuse subcutaneous edema. Musculoskeletal: Degenerative changes of the spine with minimal retrolisthesis of L3 on L4. Minimal anterolisthesis of L4 on  L5. Prominent Schmorl's node at  L4 superiorly. IMPRESSION: No CT evidence for a bowel obstruction or other acute intra-abdominal or pelvic pathology Electronically Signed   By: Donavan Foil M.D.   On: 11/28/2016 00:00    Lab Data:  CBC:  Recent Labs Lab 11/27/16 1945 11/28/16 0243 11/29/16 0532 11/30/16 0254  WBC 13.2* 13.9* 16.8* 11.6*  NEUTROABS 11.5*  --   --   --   HGB 10.8* 9.6* 10.0* 9.8*  HCT 32.3* 29.0* 30.3* 29.8*  MCV 95.3 97.0 95.3 93.4  PLT 244 226 228 0000000   Basic Metabolic Panel:  Recent Labs Lab 11/27/16 1945 11/28/16 0243 11/29/16 0532 11/30/16 0254  NA 134* 133* 134* 133*  K 4.7 4.3 4.7 4.2  CL 96* 99* 104 103  CO2 20* 14* 14* 21*  GLUCOSE 292* 447* 337* 194*  BUN 33* 38* 41* 39*  CREATININE 1.18* 1.87* 1.49* 1.14*  CALCIUM 9.3 8.5* 8.4* 8.4*   GFR: Estimated Creatinine Clearance: 28.7 mL/min (by C-G formula based on SCr of 1.14 mg/dL (H)). Liver Function Tests:  Recent Labs Lab 11/27/16 1945 11/28/16 0243  AST 36 42*  ALT 15 19  ALKPHOS 62 59  BILITOT 1.1 1.6*  PROT 6.1* 5.7*  ALBUMIN 3.6 3.4*    Recent Labs Lab 11/27/16 1945  LIPASE 11   No results for input(s): AMMONIA in the last 168 hours. Coagulation Profile: No results for input(s): INR, PROTIME in the last 168 hours. Cardiac Enzymes: No results for input(s): CKTOTAL, CKMB, CKMBINDEX, TROPONINI in the last 168 hours. BNP (last 3 results) No results for input(s): PROBNP in the last 8760 hours. HbA1C:  Recent Labs  11/28/16 0243  HGBA1C 6.0*   CBG:  Recent Labs Lab 11/29/16 1241 11/29/16 1649 11/29/16 2127 11/30/16 0749 11/30/16 1252  GLUCAP 297* 185* 178* 171* 91   Lipid Profile: No results for input(s): CHOL, HDL, LDLCALC, TRIG, CHOLHDL, LDLDIRECT in the last 72 hours. Thyroid Function Tests: No results for input(s): TSH, T4TOTAL, FREET4, T3FREE, THYROIDAB in the last 72 hours. Anemia Panel: No results for input(s): VITAMINB12, FOLATE, FERRITIN, TIBC, IRON, RETICCTPCT in the last 72  hours. Urine analysis:    Component Value Date/Time   COLORURINE YELLOW 11/27/2016 2241   APPEARANCEUR CLOUDY (A) 11/27/2016 2241   LABSPEC 1.015 11/27/2016 2241   PHURINE 6.0 11/27/2016 2241   GLUCOSEU 500 (A) 11/27/2016 2241   HGBUR SMALL (A) 11/27/2016 2241   BILIRUBINUR NEGATIVE 11/27/2016 2241   KETONESUR 40 (A) 11/27/2016 2241   PROTEINUR 100 (A) 11/27/2016 2241   UROBILINOGEN 1.0 06/12/2015 1937   NITRITE NEGATIVE 11/27/2016 2241   LEUKOCYTESUR MODERATE (A) 11/27/2016 2241     RAI,RIPUDEEP M.D. [facility] 11/30/2016, 2:43 PM  Pager: [phone] Between 7am to 7pm - call Pager - [phone]  After 7pm go to [email or web address] - password [email or web address]  Call night coverage person covering after 7pm

## 2016-11-30 NOTE — Progress Notes (Signed)
Speech Language Pathology Treatment: Dysphagia  Patient Details Name: Michelle [name] MRN: [id number] DOB: 04-19-1932 Today's Date: 11/30/2016 Time: [id number] SLP Time Calculation (min) (ACUTE ONLY): 11 min  Assessment / Plan / Recommendation Clinical Impression  RN reports patient with bolus holding, difficulty swallowing intermittently throughout the day. Provided PO trials of ice chips and thin liquids via cup. Swallow initiation appears timely with ice chips and water, no bolus holding noted. Presented patient with trials of Ensure via straw and cup. Patient with prolonged bolus holding and suspected delayed swallow initiation despite max cues. Delayed coughing noted following Ensure trials. Recommend ice chips and cups sips of water only after oral care, can attempt meds whole with liquid if tolerated. SLP will f/u.   HPI HPI: Michelle [name] a 83 y.o.femalewith a past medical history significant for Alzheimers dementia with known history of conduction system disease, sinus node disease, COPD, PMR on pred 5, hypothyroidism, IDDM and chronic back pain on fentanyl patch 18mcgwho presents with confusion, vomiting and fecal incontinence for 1 day. The patient started to vomit and have loose stools a day or two ago.She had a recent UTI (PCP diagnosed by phone based on foul-smelling urine), culture grew enterococcus, treated with amoxicillin for 5 days. Chest X ray with no evidence of acute cardiopulmonary disease. She has been seen by SLP during prior admissions in May and Nov. 2017. Swallowing deficits noted included oral holding likely 2/2 dementia. She was discharged on dysphagia 2/thin liquids during last admission.      SLP Plan  Continue with current plan of care;MBS     Recommendations  Diet recommendations: Other(comment) (Water/ice chips after oral care) Liquids provided via: Cup Medication Administration: Whole meds with liquid Supervision: Full supervision/cueing for compensatory  strategies;Staff to assist with self feeding Compensations: Small sips/bites;Slow rate;Minimize environmental distractions Postural Changes and/or Swallow Maneuvers: Seated upright 90 degrees                Oral Care Recommendations: Oral care QID Follow up Recommendations: Skilled Nursing facility Plan: Continue with current plan of care;MBS       [place], [place] CF-SLP Speech-Language Pathologist [phone]  Aliene Altes 11/30/2016, 6:40 PM

## 2016-11-30 NOTE — Evaluation (Signed)
Occupational Therapy Evaluation and Discharge Patient Details Name: Michelle [name] MRN: [id number] DOB: 01-11-1932 Today's Date: 11/30/2016    History of Present Illness Pt is an 83 y/o female admitted from home secondary to confusion and vomitting. PMH including but not limited to dementia, COPD, DM, HTN, hypothyroidism and pacemaker placement in 2017.   Clinical Impression   This 83 yo female admitted with above presents to acute OT with deficits below (see OT problem list) thus affecting her PLOF of being totally ambulatory from a RW level and able to perform some of her basic ADLs. She will continue to benefit from follow up OT at SNF to work on increasing mobility and basic ADLs to decrease burden of care.     Follow Up Recommendations  SNF;Supervision/Assistance - 24 hour    Equipment Recommendations  Other (comment) (TBD at next venue)       Precautions / Restrictions Precautions Precautions: Fall Restrictions Weight Bearing Restrictions: No      Mobility Bed Mobility Overal bed mobility: Needs Assistance Bed Mobility: Supine to Sit     Supine to sit: Max assist;HOB elevated     General bed mobility comments: VCs for sequencing  Transfers Overall transfer level: Needs assistance Equipment used: Rolling walker (2 wheeled) Transfers: Sit to/from [facility] Sit to Stand: Mod assist;+2 physical assistance Stand pivot transfers: Mod assist;+2 physical assistance            Balance Overall balance assessment: Needs assistance Sitting-balance support: Feet supported;Bilateral upper extremity supported Sitting balance-Leahy Scale: Poor   Postural control: Right lateral lean;Posterior lean Standing balance support: Bilateral upper extremity supported;During functional activity Standing balance-Leahy Scale: Poor Standing balance comment: pt reliant on bilateral UEs on RW                            ADL Overall ADL's : Needs  assistance/impaired Eating/Feeding: Total assistance;Bed level   Grooming: Total assistance;Sitting Grooming Details (indicate cue type and reason): difficulty keeping her balance evey statically Upper Body Bathing: Total assistance;Sitting Upper Body Bathing Details (indicate cue type and reason): difficulty keeping her balance evey statically Lower Body Bathing: Total assistance Lower Body Bathing Details (indicate cue type and reason): Mod A +2 sit<>stand Upper Body Dressing : Total assistance;Sitting Upper Body Dressing Details (indicate cue type and reason): difficulty keeping her balance evey statically Lower Body Dressing: Total assistance Lower Body Dressing Details (indicate cue type and reason): Mod A +2 sit<>stand Toilet Transfer: Moderate assistance;+2 for physical assistance;Stand-pivot Toilet Transfer Details (indicate cue type and reason): from bed to recliner going to pt's right with pt wanting to prematurely sit Toileting- Clothing Manipulation and Hygiene: Total assistance (mod A +2 sit<>stand)                Pertinent Vitals/Pain Pain Assessment: No/denies pain     Hand Dominance  Right   Extremity/Trunk Assessment Upper Extremity Assessment Upper Extremity Assessment: Generalized weakness           Communication  Talks in a low tone at times   Cognition Arousal/Alertness: Awake/alert Behavior During Therapy: WFL for tasks assessed/performed Overall Cognitive Status: History of cognitive impairments - at baseline                                Home Living Family/patient expects to be discharged to:: Skilled nursing facility  OT Problem List: Decreased strength;Decreased activity tolerance;Impaired balance (sitting and/or standing);Decreased safety awareness      OT Goals(Current goals can be found in the care plan section) Acute Rehab OT Goals Patient Stated Goal:  per chart SNF  OT Frequency:                End of Session Equipment Utilized During Treatment: Gait belt;Rolling walker Nurse Communication: Mobility status  Activity Tolerance: Patient limited by fatigue Patient left: in chair;with call bell/phone within reach;with chair alarm set;with family/visitor present   Time: 1311-1350 OT Time Calculation (min): 39 min Charges:  OT General Charges $OT Visit: 1 Procedure OT Evaluation $OT Eval Moderate Complexity: 1 Procedure OT Treatments $Self Care/Home Management : 23-37 mins  Almon Register [id number] 11/30/2016, 3:33 PM

## 2016-12-01 LAB — CBC
HEMATOCRIT: 30.2 % — AB (ref 36.0–46.0)
Hemoglobin: 10.2 g/dL — ABNORMAL LOW (ref 12.0–15.0)
MCH: 32 pg (ref 26.0–34.0)
MCHC: 33.8 g/dL (ref 30.0–36.0)
MCV: 94.7 fL (ref 78.0–100.0)
PLATELETS: 143 10*3/uL — AB (ref 150–400)
RBC: 3.19 MIL/uL — ABNORMAL LOW (ref 3.87–5.11)
RDW: 14.7 % (ref 11.5–15.5)
WBC: 5.9 10*3/uL (ref 4.0–10.5)

## 2016-12-01 LAB — GLUCOSE, CAPILLARY
GLUCOSE-CAPILLARY: 247 mg/dL — AB (ref 65–99)
Glucose-Capillary: 229 mg/dL — ABNORMAL HIGH (ref 65–99)
Glucose-Capillary: 155 mg/dL — ABNORMAL HIGH (ref 65–99)
Glucose-Capillary: 213 mg/dL — ABNORMAL HIGH (ref 65–99)

## 2016-12-01 LAB — BASIC METABOLIC PANEL
Anion gap: 12 (ref 5–15)
BUN: 39 mg/dL — ABNORMAL HIGH (ref 6–20)
CALCIUM: 8.2 mg/dL — AB (ref 8.9–10.3)
CO2: 17 mmol/L — ABNORMAL LOW (ref 22–32)
CREATININE: 0.99 mg/dL (ref 0.44–1.00)
Chloride: 100 mmol/L — ABNORMAL LOW (ref 101–111)
GFR calc Af Amer: 60 mL/min — ABNORMAL LOW (ref >60)
GFR calc non Af Amer: 52 mL/min — ABNORMAL LOW (ref >60)
Glucose, Bld: 218 mg/dL — ABNORMAL HIGH (ref 65–99)
Potassium: 4.7 mmol/L (ref 3.5–5.1)
SODIUM: 129 mmol/L — AB (ref 135–145)

## 2016-12-01 MED ORDER — SODIUM CHLORIDE 0.9 % IV SOLN
1.5000 g | Freq: Four times a day (QID) | INTRAVENOUS | Status: DC
  Administered 2016-12-01 – 2016-12-02 (×2): 1.5 g via INTRAVENOUS
  Filled 2016-12-01 (×4): qty 1.5

## 2016-12-01 MED ORDER — ENOXAPARIN SODIUM 40 MG/0.4ML ~~LOC~~ SOLN
40.0000 mg | SUBCUTANEOUS | Status: DC
  Administered 2016-12-02: 40 mg via SUBCUTANEOUS
  Filled 2016-12-01 (×2): qty 0.4

## 2016-12-01 MED ORDER — AMLODIPINE BESYLATE 2.5 MG PO TABS
2.5000 mg | ORAL_TABLET | Freq: Every day | ORAL | Status: DC
  Administered 2016-12-01 – 2016-12-02 (×2): 2.5 mg via ORAL
  Filled 2016-12-01 (×2): qty 1

## 2016-12-01 MED ORDER — HYDRALAZINE HCL 20 MG/ML IJ SOLN: 10.0000 mg | Freq: Four times a day (QID) | INTRAMUSCULAR | Status: DC | PRN

## 2016-12-01 MED ORDER — CARVEDILOL 6.25 MG PO TABS
6.2500 mg | ORAL_TABLET | Freq: Two times a day (BID) | ORAL | Status: DC
  Administered 2016-12-01 – 2016-12-02 (×3): 6.25 mg via ORAL
  Filled 2016-12-01 (×3): qty 1

## 2016-12-01 NOTE — Progress Notes (Signed)
Pharmacy Antibiotic Note  Michelle [name] is a 83 y.o. female admitted on 11/27/2016 with AMS/vomiting concerning for sepsis due to UTI and aspiration PNA. Per discussion with Dr. Tana Coast today - plans are now to narrow from Vancomycin + Rocephin to Unasyn. SCr 0.99, CrCl~30-40 ml/min   Plan: 1. D/c Vancomycin and Rocephin  2. Start Unasyn 1.5g IV every 6 hours 3. Will continue to follow renal function, culture results, LOT, and antibiotic de-escalation plans   Height: 5\' 1"  (154.9 cm) Weight: 119 lb 4.3 oz (54.1 kg) IBW/kg (Calculated) : 47.8  Temp (24hrs), Avg:98.4 F (36.9 C), Min:97.7 F (36.5 C), Max:99.2 F (37.3 C)   Recent Labs Lab 11/27/16 1945 11/27/16 2002 11/27/16 2359 11/28/16 0243 11/28/16 0548 11/29/16 0532 11/30/16 0254 12/01/16 0339  WBC 13.2*  --   --  13.9*  --  16.8* 11.6* 5.9  CREATININE 1.18*  --   --  1.87*  --  1.49* 1.14* 0.99  LATICACIDVEN  --  2.15* 2.70* 2.8* 1.8  --   --   --     Estimated Creatinine Clearance: 33.1 mL/min (by C-G formula based on SCr of 0.99 mg/dL).    No Known Allergies  Antimicrobials this admission:  Zosyn 1/19 x1 Vancomycin 1/19 >> 1/22 Ceftriaxone 1/19 >> 1/22 Unasyn 1/22 >>  Dose adjustments this admission:  N/a  Microbiology results:  1/18 BCx: NG x 2 days 1/18 UCx: 20000 yeast 1/18 GI Panel: sent   1/19 MRSA PCR: negative  Thank you for allowing pharmacy to be a part of this patient's care.  Alycia Rossetti, PharmD, BCPS Clinical Pharmacist Pager: [phone] 12/01/2016 3:47 PM

## 2016-12-01 NOTE — Progress Notes (Signed)
[facility]                                                                              Patient Demographics  Michelle [name], is a 83 y.o. female, DOB - 1932/08/10, [id number]  Admit date - 11/27/2016   Admitting Physician Edwin Dada, MD  Outpatient Primary MD for the patient is [name], MD  Outpatient specialists:   LOS - 3  days    Chief Complaint  Patient presents with  . Nausea  . Emesis  . Diarrhea       Brief summary   Patient is a 83 year old female with dementia, COPD, PMR on prednisone, hypothyroidism, diabetes presented with confusion, vomiting and fecal incontinence.She had a recent UTI (PCP diagnosed by phone based on foul-smelling urine), culture grew enterococcus, treated with amoxicillin for 5 days.  The patient endorses dysuria.    Assessment & Plan    Principal Problem:  Sepsis, suspected from UTI: Recent enterococcus UTI - Urine culture however showed 20,000 colonies of yeast, will finish off IV Rocephin for 3 days.  - reviewed prior urine culture results, placed back on IV vancomycin  - Influenza swab negative   COPD exacerbation, aspiration pneumonitis - Placed on IV Solu-Medrol, duo nebs, Mucinex - Swallow evaluation done with moderate degree of aspiration - transition to Augmentin at the time of discharge and aspiration precautions - will place on prednisone once tolerating diet and on taper  Aspiration/dysphagia - Currently nothing by mouth for swallowing evaluation, MBS - Diet per speech therapy   Hyponatremia:  - Mild, in setting of sepsis.   Anemia:  Chronic, normocytic.  At baseline.   Insulin dependent diabetes:  with hyperglycemia scan due to steroids -Increase Lantus, and meal coverage, increase sliding scale to moderate  PMR:  -Hold prednisone Continue IV Solu-Medrol  Chronic back pain:  -Continue fentanyl patch  Hypertension and coronary secondary prevention:  -Continue  aspirin - Restart amlodipine, Coreg  Hypothyroidism: -Continue levothyroxine  Code Status: DNR DVT Prophylaxis:  Lovenox Family Communication: Discussed in detail with the patient, all imaging results, lab results explained to the patient and daughter   Disposition Plan: Skilled nursing facility once bed is available and tolerating diet Time Spent in minutes   25 minutes  Procedures:  None   Consultants:   none   Antimicrobials:      Medications  Scheduled Meds: . aspirin EC  81 mg Oral Daily  . cefTRIAXone (ROCEPHIN)  IV  2 g Intravenous Q24H  . enoxaparin (LOVENOX) injection  30 mg Subcutaneous Q24H  . feeding supplement (ENSURE ENLIVE)  237 mL Oral BID BM  . fentaNYL  25 mcg Transdermal Q72H  . guaiFENesin  1,200 mg Oral BID  . insulin aspart  0-15 Units Subcutaneous TID WC  . insulin aspart  0-5 Units Subcutaneous QHS  . insulin aspart  3 Units Subcutaneous TID WC  . insulin glargine  10 Units Subcutaneous QHS  . ipratropium-albuterol  3 mL Nebulization TID  . levothyroxine  100 mcg Oral QAC breakfast  . methylPREDNISolone (SOLU-MEDROL) injection  40 mg Intravenous Q12H  . polyethylene glycol  17 g  Oral Daily  . psyllium  1 packet Oral BID  . sodium chloride flush  3 mL Intravenous Q12H  . vancomycin  750 mg Intravenous Q48H   Continuous Infusions:  PRN Meds:.acetaminophen **OR** acetaminophen, hydrALAZINE, levalbuterol, LORazepam, ondansetron **OR** ondansetron (ZOFRAN) IV   Antibiotics   Anti-infectives    Start     Dose/Rate Route Frequency Ordered Stop   11/30/16 0800  vancomycin (VANCOCIN) IVPB 750 mg/150 ml premix     750 mg 150 mL/hr over 60 Minutes Intravenous Every 48 hours 11/28/16 1329     11/28/16 2200  vancomycin (VANCOCIN) 500 mg in sodium chloride 0.9 % 100 mL IVPB  Status:  Discontinued     500 mg 100 mL/hr over 60 Minutes Intravenous Every 24 hours 11/28/16 0212 11/28/16 0917   11/28/16 0930  cefTRIAXone (ROCEPHIN) 1 g in dextrose 5  % 50 mL IVPB  Status:  Discontinued     1 g 100 mL/hr over 30 Minutes Intravenous Every 24 hours 11/28/16 0917 11/28/16 0917   11/28/16 0930  cefTRIAXone (ROCEPHIN) 2 g in dextrose 5 % 50 mL IVPB     2 g 100 mL/hr over 30 Minutes Intravenous Every 24 hours 11/28/16 0917     11/28/16 0600  piperacillin-tazobactam (ZOSYN) IVPB 3.375 g  Status:  Discontinued     3.375 g 12.5 mL/hr over 240 Minutes Intravenous Every 8 hours 11/28/16 0212 11/28/16 0917   11/28/16 0045  piperacillin-tazobactam (ZOSYN) IVPB 3.375 g     3.375 g 100 mL/hr over 30 Minutes Intravenous  Once 11/28/16 0033 11/28/16 0141   11/28/16 0045  vancomycin (VANCOCIN) IVPB 1000 mg/200 mL premix     1,000 mg 200 mL/hr over 60 Minutes Intravenous  Once 11/28/16 0033 11/28/16 0200        Subjective:   Coila Yarbrough was seen and examined today. States feeling good today. Awaiting MBS. Patient denies dizziness, chest pain,  abdominal pain, new weakness, numbess, tingling. No acute events overnight.   Objective:   Vitals:   12/01/16 1100 12/01/16 1143 12/01/16 1200 12/01/16 1300  BP:  (!) 155/105 (!) 160/105   Pulse: 85   84  Resp: 12  17 17   Temp:  98.5 F (36.9 C)    TempSrc:  Axillary    SpO2: 94%   99%  Weight:      Height:        Intake/Output Summary (Last 24 hours) at 12/01/16 1309 Last data filed at 12/01/16 0941  Gross per 24 hour  Intake              290 ml  Output                0 ml  Net              290 ml     Wt Readings from Last 3 Encounters:  11/28/16 54.1 kg (119 lb 4.3 oz)  10/20/16 54.1 kg (119 lb 4.8 oz)  09/28/16 54.4 kg (119 lb 14.9 oz)     Exam  General: Alert and oriented x 2, NAD, Frail and weak  HEENT:    Neck: Supple, no JVD  Cardiovascular: S1 S2 clear, RRR  Respiratory: Decreased breath sounds at the bases otherwise fairly clear  Gastrointestinal: Soft, nontender, nondistended, + bowel sounds  Ext: no cyanosis clubbing or edema  Neuro: no new deficits  Skin: No  rashes  Psych: Normal affect and demeanor, alert and oriented x2   Data Reviewed:  I have personally reviewed following labs and imaging studies  Micro Results Recent Results (from the past 240 hour(s))  Blood Culture (routine x 2)     Status: None (Preliminary result)   Collection Time: 11/27/16  7:45 PM  Result Value Ref Range Status   Specimen Description BLOOD RIGHT ANTECUBITAL  Final   Special Requests BOTTLES DRAWN AEROBIC AND ANAEROBIC 5CC  Final   Culture NO GROWTH 3 DAYS  Final   Report Status PENDING  Incomplete  Blood Culture (routine x 2)     Status: None (Preliminary result)   Collection Time: 11/27/16  7:51 PM  Result Value Ref Range Status   Specimen Description BLOOD RIGHT WRIST  Final   Special Requests IN PEDIATRIC BOTTLE 4CC  Final   Culture NO GROWTH 3 DAYS  Final   Report Status PENDING  Incomplete  Urine culture     Status: Abnormal   Collection Time: 11/27/16 10:41 PM  Result Value Ref Range Status   Specimen Description URINE, CATHETERIZED  Final   Special Requests NONE  Final   Culture 20,000 COLONIES/mL YEAST (A)  Final   Report Status 11/29/2016 FINAL  Final  MRSA PCR Screening     Status: None   Collection Time: 11/28/16  2:22 AM  Result Value Ref Range Status   MRSA by PCR NEGATIVE NEGATIVE Final    Comment:        The GeneXpert MRSA Assay (FDA approved for NASAL specimens only), is one component of a comprehensive MRSA colonization surveillance program. It is not intended to diagnose MRSA infection nor to guide or monitor treatment for MRSA infections.     Radiology Reports Dg Chest 2 View  Result Date: 11/27/2016 CLINICAL DATA:  83 year old female with history of nausea, vomiting and diarrhea. EXAM: CHEST  2 VIEW COMPARISON:  Chest x-ray 10/01/2016. FINDINGS: Lung volumes are low. No consolidative airspace disease. No pleural effusions. No pneumothorax. No pulmonary nodule or mass noted. Pulmonary vasculature and the cardiomediastinal  silhouette are within normal limits. Aortic atherosclerosis. Left-sided pacemaker device in place with lead tips projecting over the expected location of the right atrium and right ventricular apex. Orthopedic fixation hardware in the lower cervical spine incidentally noted. IMPRESSION: 1. Low lung volumes without radiographic evidence of acute cardiopulmonary disease. 2. Aortic atherosclerosis. Electronically Signed   By: Vinnie Langton M.D.   On: 11/27/2016 19:21   Ct Abdomen Pelvis W Contrast  Result Date: 11/28/2016 CLINICAL DATA:  Altered mental status vomiting EXAM: CT ABDOMEN AND PELVIS WITH CONTRAST TECHNIQUE: Multidetector CT imaging of the abdomen and pelvis was performed using the standard protocol following bolus administration of intravenous contrast. CONTRAST:  15mL ISOVUE-300 IOPAMIDOL (ISOVUE-300) INJECTION 61% COMPARISON:  None. FINDINGS: Lower chest: Lung bases demonstrate patchy dependent atelectasis at the left base. No pleural effusion. Cardiomegaly is present. Partially visualized pacer leads. Dense mitral calcifications. Hepatobiliary: No focal hepatic abnormality. Surgical clips in the gallbladder fossa. No biliary dilatation Pancreas: Unremarkable. No pancreatic ductal dilatation or surrounding inflammatory changes. Spleen: Normal in size without focal abnormality. Adrenals/Urinary Tract: Adrenal glands within normal limits. Kidneys show no hydronephrosis. The bladder is enlarged but otherwise unremarkable Stomach/Bowel: The stomach is nonenlarged. There is no dilated small bowel. No colon wall thickening. Appendix normal. Vascular/Lymphatic: Moderate atherosclerotic vascular calcifications of the aorta and branch vessels. No grossly enlarged lymph nodes Reproductive: Status post hysterectomy. No adnexal masses. Other: No free air or free fluid.  Diffuse subcutaneous edema. Musculoskeletal: Degenerative changes of the spine with  minimal retrolisthesis of L3 on L4. Minimal  anterolisthesis of L4 on L5. Prominent Schmorl's node at L4 superiorly. IMPRESSION: No CT evidence for a bowel obstruction or other acute intra-abdominal or pelvic pathology Electronically Signed   By: Donavan Foil M.D.   On: 11/28/2016 00:00    Lab Data:  CBC:  Recent Labs Lab 11/27/16 1945 11/28/16 0243 11/29/16 0532 11/30/16 0254 12/01/16 0339  WBC 13.2* 13.9* 16.8* 11.6* 5.9  NEUTROABS 11.5*  --   --   --   --   HGB 10.8* 9.6* 10.0* 9.8* 10.2*  HCT 32.3* 29.0* 30.3* 29.8* 30.2*  MCV 95.3 97.0 95.3 93.4 94.7  PLT 244 226 228 193 [id number]*   Basic Metabolic Panel:  Recent Labs Lab 11/27/16 1945 11/28/16 0243 11/29/16 0532 11/30/16 0254 12/01/16 0339  NA 134* 133* 134* 133* 129*  K 4.7 4.3 4.7 4.2 4.7  CL 96* 99* 104 103 100*  CO2 20* 14* 14* 21* 17*  GLUCOSE 292* 447* 337* 194* 218*  BUN 33* 38* 41* 39* 39*  CREATININE 1.18* 1.87* 1.49* 1.14* 0.99  CALCIUM 9.3 8.5* 8.4* 8.4* 8.2*   GFR: Estimated Creatinine Clearance: 33.1 mL/min (by C-G formula based on SCr of 0.99 mg/dL). Liver Function Tests:  Recent Labs Lab 11/27/16 1945 11/28/16 0243  AST 36 42*  ALT 15 19  ALKPHOS 62 59  BILITOT 1.1 1.6*  PROT 6.1* 5.7*  ALBUMIN 3.6 3.4*    Recent Labs Lab 11/27/16 1945  LIPASE 11   No results for input(s): AMMONIA in the last 168 hours. Coagulation Profile: No results for input(s): INR, PROTIME in the last 168 hours. Cardiac Enzymes: No results for input(s): CKTOTAL, CKMB, CKMBINDEX, TROPONINI in the last 168 hours. BNP (last 3 results) No results for input(s): PROBNP in the last 8760 hours. HbA1C: No results for input(s): HGBA1C in the last 72 hours. CBG:  Recent Labs Lab 11/30/16 1252 11/30/16 1740 11/30/16 2032 12/01/16 0807 12/01/16 1149  GLUCAP 91 140* 164* 247* 229*   Lipid Profile: No results for input(s): CHOL, HDL, LDLCALC, TRIG, CHOLHDL, LDLDIRECT in the last 72 hours. Thyroid Function Tests: No results for input(s): TSH, T4TOTAL,  FREET4, T3FREE, THYROIDAB in the last 72 hours. Anemia Panel: No results for input(s): VITAMINB12, FOLATE, FERRITIN, TIBC, IRON, RETICCTPCT in the last 72 hours. Urine analysis:    Component Value Date/Time   COLORURINE YELLOW 11/27/2016 2241   APPEARANCEUR CLOUDY (A) 11/27/2016 2241   LABSPEC 1.015 11/27/2016 2241   PHURINE 6.0 11/27/2016 2241   GLUCOSEU 500 (A) 11/27/2016 2241   HGBUR SMALL (A) 11/27/2016 2241   BILIRUBINUR NEGATIVE 11/27/2016 2241   KETONESUR 40 (A) 11/27/2016 2241   PROTEINUR 100 (A) 11/27/2016 2241   UROBILINOGEN 1.0 06/12/2015 1937   NITRITE NEGATIVE 11/27/2016 2241   LEUKOCYTESUR MODERATE (A) 11/27/2016 2241     Franco Duley M.D. [facility] 12/01/2016, 1:09 PM  Pager: [phone] Between 7am to 7pm - call Pager - [phone]  After 7pm go to [email or web address] - password [email or web address]  Call night coverage person covering after 7pm

## 2016-12-01 NOTE — Progress Notes (Signed)
CSW provided offers to pt dtr, Debbie.  Chooses [facility] as first choice and [facility] as 2nd.  CSW will continue to follow and assist with DC when medically stable  Jorge Ny, [facility] Social Worker [phone]

## 2016-12-01 NOTE — Clinical Social Work Note (Signed)
Clinical Social Work Assessment  Patient Details  Name: Michelle [name] MRN: [id number] Date of Birth: 1931-11-16  Date of referral:  12/01/16               Reason for consult:  Facility Placement                Permission sought to share information with:  Facility Art therapist granted to share information::  No  Name::     Community education officer::  SNF  Relationship::  dtr  Contact Information:     Housing/Transportation Living arrangements for the past 2 months:  Single Family Home Source of Information:  Adult Children Patient Interpreter Needed:  None Criminal Activity/Legal Involvement Pertinent to Current Situation/Hospitalization:  No - Comment as needed Significant Relationships:  Adult Children Lives with:  Adult Children Do you feel safe going back to the place where you live?  No Need for family participation in patient care:  Yes (Comment) (decision making)  Care giving concerns:  Pt lives at home with dtr and caregiver support- per dtr one of the caregivers no longer able to work and so home is not a safe plan.   Social Worker assessment / plan:  CSW spoke with dtr concerning PT recommendation for SNF.  Dtr states that taking care of pt at home has become too much and she is interested in longer term placement.  CSW explained that PT recommendation was for short term rehab and that is what would be paid for by insurance.  CSW discussed long term placement and need for family to private pay or apply for Medicaid if eligible- dtr does not think pt would qualify for Medicaid- she owns a house and property.  Employment status:  Retired Health visitor, Managed Care PT Recommendations:  [place] / Referral to community resources:  [place]  Patient/Family's Response to care:  Agreeable to short term placement.  Patient/Family's Understanding of and Emotional Response to Diagnosis, Current  Treatment, and Prognosis:  Dtr frustrated by the barriers to placement but understanding- hopeful pt can at least improve with rehab.  Emotional Assessment Appearance:  Appears stated age Attitude/Demeanor/Rapport:  Unable to Assess Affect (typically observed):  Unable to Assess Orientation:  Oriented to Place, Oriented to Self Alcohol / Substance use:  Not Applicable Psych involvement (Current and /or in the community):  No (Comment)  Discharge Needs  Concerns to be addressed:  Care Coordination Readmission within the last 30 days:  No Current discharge risk:  Physical Impairment Barriers to Discharge:  Continued Medical Work up   Jorge Ny, LCSW 12/01/2016, 4:40 PM

## 2016-12-01 NOTE — Progress Notes (Signed)
Speech Language Pathology Treatment: Dysphagia  Patient Details Name: Michelle [name] MRN: [id number] DOB: [birth year] Today's Date: 12/01/2016 Time: 1511-1530 SLP Time Calculation (min) (ACUTE ONLY): 19 min  Assessment / Plan / Recommendation Clinical Impression  Patient seen at bedside, alert and oriented, able to follow commands and self feed po trials. Patient with seemingly good airway protection across solid and liquid trials with intermittently delayed but functional oral transit of solid boluses only. One time slight wet vocal quality noted post intake with thin liquids but otherwise, patient without overt s/s of aspiration. Patient with known h/o primarily oral dysphagia, recently d/c'd on dyspahgia 2 with thin liquids. Defer MBS today and recommend initiation of a po diet. SLP will f/u for tolerance, note possible aspiration pneumonitis.    HPI HPI: Michelle [name] a 83 y.o.femalewith a past medical history significant for Alzheimers dementia with known history of conduction system disease, sinus node disease, COPD, PMR on pred 5, hypothyroidism, IDDM and chronic back pain on fentanyl patch 108mcgwho presents with confusion, vomiting and fecal incontinence for 1 day. The patient started to vomit and have loose stools a day or two ago.She had a recent UTI (PCP diagnosed by phone based on foul-smelling urine), culture grew enterococcus, treated with amoxicillin for 5 days. Chest X ray with no evidence of acute cardiopulmonary disease. She has been seen by SLP during prior admissions in May and Nov. 2017. Swallowing deficits noted included oral holding likely 2/2 dementia. She was discharged on dysphagia 2/thin liquids during last admission.      SLP Plan  Goals updated     Recommendations  Diet recommendations: Dysphagia 2 (fine chop);Thin liquid Liquids provided via: Cup;Straw Medication Administration: Whole meds with puree Supervision: Patient able to self feed;Intermittent  supervision to cue for compensatory strategies Compensations: Slow rate;Small sips/bites;Minimize environmental distractions Postural Changes and/or Swallow Maneuvers: Seated upright 90 degrees                Oral Care Recommendations: Oral care BID Follow up Recommendations: Skilled Nursing facility Plan: Goals updated       [place] 12/01/2016, 4:16 PM

## 2016-12-02 DIAGNOSIS — N390 Urinary tract infection, site not specified: Secondary | ICD-10-CM | POA: Diagnosis not present

## 2016-12-02 DIAGNOSIS — J690 Pneumonitis due to inhalation of food and vomit: Secondary | ICD-10-CM | POA: Diagnosis not present

## 2016-12-02 DIAGNOSIS — M353 Polymyalgia rheumatica: Secondary | ICD-10-CM | POA: Diagnosis not present

## 2016-12-02 DIAGNOSIS — M6281 Muscle weakness (generalized): Secondary | ICD-10-CM | POA: Diagnosis not present

## 2016-12-02 DIAGNOSIS — L89312 Pressure ulcer of right buttock, stage 2: Secondary | ICD-10-CM | POA: Diagnosis not present

## 2016-12-02 DIAGNOSIS — R531 Weakness: Secondary | ICD-10-CM | POA: Diagnosis not present

## 2016-12-02 DIAGNOSIS — E1159 Type 2 diabetes mellitus with other circulatory complications: Secondary | ICD-10-CM | POA: Diagnosis not present

## 2016-12-02 DIAGNOSIS — B379 Candidiasis, unspecified: Secondary | ICD-10-CM | POA: Diagnosis not present

## 2016-12-02 DIAGNOSIS — R0989 Other specified symptoms and signs involving the circulatory and respiratory systems: Secondary | ICD-10-CM | POA: Diagnosis not present

## 2016-12-02 DIAGNOSIS — J441 Chronic obstructive pulmonary disease with (acute) exacerbation: Secondary | ICD-10-CM | POA: Diagnosis not present

## 2016-12-02 DIAGNOSIS — K5901 Slow transit constipation: Secondary | ICD-10-CM | POA: Diagnosis not present

## 2016-12-02 DIAGNOSIS — E039 Hypothyroidism, unspecified: Secondary | ICD-10-CM | POA: Diagnosis not present

## 2016-12-02 DIAGNOSIS — E43 Unspecified severe protein-calorie malnutrition: Secondary | ICD-10-CM | POA: Diagnosis not present

## 2016-12-02 DIAGNOSIS — R1310 Dysphagia, unspecified: Secondary | ICD-10-CM | POA: Diagnosis not present

## 2016-12-02 DIAGNOSIS — G309 Alzheimer's disease, unspecified: Secondary | ICD-10-CM | POA: Diagnosis not present

## 2016-12-02 DIAGNOSIS — B372 Candidiasis of skin and nail: Secondary | ICD-10-CM | POA: Diagnosis not present

## 2016-12-02 DIAGNOSIS — R262 Difficulty in walking, not elsewhere classified: Secondary | ICD-10-CM | POA: Diagnosis not present

## 2016-12-02 DIAGNOSIS — F0280 Dementia in other diseases classified elsewhere without behavioral disturbance: Secondary | ICD-10-CM | POA: Diagnosis not present

## 2016-12-02 DIAGNOSIS — J449 Chronic obstructive pulmonary disease, unspecified: Secondary | ICD-10-CM | POA: Diagnosis not present

## 2016-12-02 DIAGNOSIS — J189 Pneumonia, unspecified organism: Secondary | ICD-10-CM | POA: Diagnosis not present

## 2016-12-02 DIAGNOSIS — R1312 Dysphagia, oropharyngeal phase: Secondary | ICD-10-CM | POA: Diagnosis not present

## 2016-12-02 DIAGNOSIS — D649 Anemia, unspecified: Secondary | ICD-10-CM

## 2016-12-02 DIAGNOSIS — R05 Cough: Secondary | ICD-10-CM | POA: Diagnosis not present

## 2016-12-02 DIAGNOSIS — M545 Low back pain: Secondary | ICD-10-CM | POA: Diagnosis not present

## 2016-12-02 DIAGNOSIS — R627 Adult failure to thrive: Secondary | ICD-10-CM | POA: Diagnosis not present

## 2016-12-02 DIAGNOSIS — J45909 Unspecified asthma, uncomplicated: Secondary | ICD-10-CM | POA: Diagnosis not present

## 2016-12-02 DIAGNOSIS — G301 Alzheimer's disease with late onset: Secondary | ICD-10-CM | POA: Diagnosis not present

## 2016-12-02 DIAGNOSIS — I10 Essential (primary) hypertension: Secondary | ICD-10-CM | POA: Diagnosis not present

## 2016-12-02 DIAGNOSIS — N3001 Acute cystitis with hematuria: Secondary | ICD-10-CM | POA: Diagnosis not present

## 2016-12-02 DIAGNOSIS — I2510 Atherosclerotic heart disease of native coronary artery without angina pectoris: Secondary | ICD-10-CM | POA: Diagnosis not present

## 2016-12-02 DIAGNOSIS — E871 Hypo-osmolality and hyponatremia: Secondary | ICD-10-CM | POA: Diagnosis not present

## 2016-12-02 DIAGNOSIS — I509 Heart failure, unspecified: Secondary | ICD-10-CM | POA: Diagnosis not present

## 2016-12-02 DIAGNOSIS — Z794 Long term (current) use of insulin: Secondary | ICD-10-CM | POA: Diagnosis not present

## 2016-12-02 DIAGNOSIS — R300 Dysuria: Secondary | ICD-10-CM | POA: Diagnosis not present

## 2016-12-02 DIAGNOSIS — R488 Other symbolic dysfunctions: Secondary | ICD-10-CM | POA: Diagnosis not present

## 2016-12-02 DIAGNOSIS — E1121 Type 2 diabetes mellitus with diabetic nephropathy: Secondary | ICD-10-CM | POA: Diagnosis not present

## 2016-12-02 DIAGNOSIS — R6520 Severe sepsis without septic shock: Secondary | ICD-10-CM | POA: Diagnosis not present

## 2016-12-02 DIAGNOSIS — R5381 Other malaise: Secondary | ICD-10-CM | POA: Diagnosis not present

## 2016-12-02 LAB — CULTURE, BLOOD (ROUTINE X 2)
Culture: NO GROWTH
Culture: NO GROWTH

## 2016-12-02 LAB — GLUCOSE, CAPILLARY
Glucose-Capillary: 145 mg/dL — ABNORMAL HIGH (ref 65–99)
Glucose-Capillary: 185 mg/dL — ABNORMAL HIGH (ref 65–99)

## 2016-12-02 MED ORDER — AMOXICILLIN-POT CLAVULANATE 875-125 MG PO TABS: 1.0000 | ORAL_TABLET | Freq: Two times a day (BID) | ORAL | Status: DC

## 2016-12-02 MED ORDER — PREDNISONE 10 MG PO TABS: ORAL_TABLET | ORAL | Status: DC

## 2016-12-02 MED ORDER — AMOXICILLIN-POT CLAVULANATE 875-125 MG PO TABS
1.0000 | ORAL_TABLET | Freq: Two times a day (BID) | ORAL | Status: DC
  Administered 2016-12-02: 1 via ORAL
  Filled 2016-12-02 (×2): qty 1

## 2016-12-02 MED ORDER — IPRATROPIUM-ALBUTEROL 0.5-2.5 (3) MG/3ML IN SOLN: 3.0000 mL | Freq: Two times a day (BID) | RESPIRATORY_TRACT | Status: DC

## 2016-12-02 MED ORDER — IPRATROPIUM-ALBUTEROL 0.5-2.5 (3) MG/3ML IN SOLN: 3.0000 mL | Freq: Three times a day (TID) | RESPIRATORY_TRACT | Status: AC

## 2016-12-02 MED ORDER — FENTANYL 25 MCG/HR TD PT72: 25.0000 ug | MEDICATED_PATCH | TRANSDERMAL | 0 refills | Status: AC

## 2016-12-02 MED ORDER — PREDNISONE 20 MG PO TABS
40.0000 mg | ORAL_TABLET | Freq: Every day | ORAL | Status: DC
  Administered 2016-12-02: 40 mg via ORAL
  Filled 2016-12-02: qty 2

## 2016-12-02 MED ORDER — GUAIFENESIN ER 600 MG PO TB12: 600.0000 mg | ORAL_TABLET | Freq: Two times a day (BID) | ORAL | Status: DC

## 2016-12-02 MED ORDER — PREDNISONE 5 MG PO TABS: 5.0000 mg | ORAL_TABLET | Freq: Every day | ORAL | 3 refills | Status: AC

## 2016-12-02 MED ORDER — TRAMADOL HCL 50 MG PO TABS: 50.0000 mg | ORAL_TABLET | Freq: Every day | ORAL | 0 refills | Status: DC | PRN

## 2016-12-02 MED ORDER — LORAZEPAM 1 MG PO TABS: 1.0000 mg | ORAL_TABLET | Freq: Three times a day (TID) | ORAL | 0 refills | Status: AC | PRN

## 2016-12-02 MED ORDER — INSULIN LISPRO 100 UNIT/ML ~~LOC~~ SOLN: 0.0000 [IU] | Freq: Three times a day (TID) | SUBCUTANEOUS | 5 refills | Status: AC

## 2016-12-02 NOTE — Clinical Social Work Placement (Signed)
   CLINICAL SOCIAL WORK PLACEMENT  NOTE  Date:  12/02/2016  Patient Details  Name: Michelle [name] MRN: [id number] Date of Birth: 1932-10-02  Clinical Social Work is seeking post-discharge placement for this patient at the [place] level of care (*CSW will initial, date and re-position this form in  chart as items are completed):  Yes   Patient/family provided with [place] Work Department's list of facilities offering this level of care within the geographic area requested by the patient (or if unable, by the patient's family).  Yes   Patient/family informed of their freedom to choose among providers that offer the needed level of care, that participate in Medicare, Medicaid or managed care program needed by the patient, have an available bed and are willing to accept the patient.  Yes   Patient/family informed of [facility] ownership interest in [facility] and [facility], as well as of the fact that they are under no obligation to receive care at these facilities.  PASRR submitted to EDS on 11/30/16     PASRR number received on 11/30/16     Existing PASRR number confirmed on       FL2 transmitted to all facilities in geographic area requested by pt/family on       FL2 transmitted to all facilities within larger geographic area on       Patient informed that his/her managed care company has contracts with or will negotiate with certain facilities, including the following:        Yes   Patient/family informed of bed offers received.  Patient chooses bed at [facility]     Physician recommends and patient chooses bed at      Patient to be transferred to [facility] on 12/02/16.  Patient to be transferred to facility by ptar     Patient family notified on 12/02/16 of transfer.  Name of family member notified:  Debbie     PHYSICIAN Please sign FL2     Additional Comment:     _______________________________________________ Jorge Ny, LCSW 12/02/2016, 2:28 PM

## 2016-12-02 NOTE — Discharge Summary (Addendum)
Physician Discharge Summary   Patient ID: PROMISE CREA MRN: [id number] DOB/AGE: [birth year] 83 y.o.  Admit date: 11/27/2016 Discharge date: 12/02/2016  Primary Care Physician:  [name], MD  Discharge Diagnoses:   . Aspiration pneumonia    COPD exacerbation   Aspiration/dysphagia . Sepsis ([place]) ruled out . Asthma with COPD ([place]) . Polymyalgia rheumatica ([place]) . Hypothyroidism, acquired . Alzheimer's dementia . Essential hypertension . Hyponatremia . Normocytic anemia   Consults: None  Recommendations for Outpatient Follow-up:  1. Please repeat CBC/BMET at next visit 2. Aspiration precautions   DIET: Dysphagia 2 diet with thin liquids, meds crushed in pure Per speech therapist, recommendations:  Diet recommendations: Dysphagia 2 (fine chop);Thin liquid Liquids provided via: Cup;Straw Medication Administration: Whole meds with puree Supervision: Patient able to self feed;Intermittent supervision to cue for compensatory strategies Compensations: Slow rate;Small sips/bites;Minimize environmental distractions Postural Changes and/or Swallow Maneuvers: Seated upright 90 degrees  Allergies:  No Known Allergies   DISCHARGE MEDICATIONS: Current Discharge Medication List    START taking these medications   Details  amoxicillin-clavulanate (AUGMENTIN) 875-125 MG tablet Take 1 tablet by mouth 2 (two) times daily. X 5 days    guaiFENesin (MUCINEX) 600 MG 12 hr tablet Take 1 tablet (600 mg total) by mouth 2 (two) times daily.    ipratropium-albuterol (DUONEB) 0.5-2.5 (3) MG/3ML SOLN Take 3 mLs by nebulization 3 (three) times daily. Qty: 360 mL    LORazepam (ATIVAN) 1 MG tablet Take 1 tablet (1 mg total) by mouth every 8 (eight) hours as needed for anxiety. Qty: 20 tablet, Refills: 0      CONTINUE these medications which have CHANGED   Details  fentaNYL (DURAGESIC - DOSED MCG/HR) 25 MCG/HR patch Place 1 patch (25 mcg total) onto the skin every 3 (three)  days. Qty: 5 patch, Refills: 0    insulin lispro (HUMALOG) 100 UNIT/ML injection Inject 0-0.09 mLs (0-9 Units total) into the skin 3 (three) times daily with meals. Sliding scale CBG 70 - 120: 0 units CBG 121 - 150: 1 unit,  CBG 151 - 200: 2 units,  CBG 201 - 250: 3 units,  CBG 251 - 300: 5 units,  CBG 301 - 350: 7 units,  CBG 351 - 400: 9 units   CBG > 400: 9 units and notify your MD Qty: 10 mL, Refills: 5    !! predniSONE (DELTASONE) 10 MG tablet Prednisone dosing: Take  Prednisone 40mg  (4 tabs) x 2 days, then taper to 30mg  (3 tabs) x 2 days, then 20mg  (2 tabs) x 2days, then 10mg  (1 tab) x 2days, then continue your maintenance dose of 5 mg daily    !! predniSONE (DELTASONE) 5 MG tablet Take 1 tablet (5 mg total) by mouth daily. Please continue after prednisone taper is completed Refills: 3    traMADol (ULTRAM) 50 MG tablet Take 1 tablet (50 mg total) by mouth daily as needed for moderate pain. Qty: 10 tablet, Refills: 0     !! - Potential duplicate medications found. Please discuss with provider.    CONTINUE these medications which have NOT CHANGED   Details  acetaminophen (TYLENOL) 325 MG tablet Take 2 tablets (650 mg total) by mouth every 6 (six) hours as needed for mild pain (or Fever >/= 101). Qty: 30 tablet, Refills: 0    albuterol (PROVENTIL HFA;VENTOLIN HFA) 108 (90 Base) MCG/ACT inhaler Inhale 1-2 puffs into the lungs every 4 (four) hours as needed for wheezing or shortness of breath. Qty: 1 Inhaler, Refills: 0  amLODipine (NORVASC) 2.5 MG tablet Take 1 tablet (2.5 mg total) by mouth daily. Qty: 30 tablet, Refills: 0    aspirin 81 MG tablet Take 81 mg by mouth daily.    carvedilol (COREG) 6.25 MG tablet Take 6.25 mg by mouth 2 (two) times daily with a meal.    diphenhydramine-acetaminophen (TYLENOL PM) 25-500 MG TABS tablet Take 2 tablets by mouth at bedtime.    glucagon 1 MG injection Inject 1 mg into the vein once as needed. Qty: 1 each, Refills: 12    Insulin  Syringe-Needle U-100 31G X 5/16" 1 ML MISC For injection of insulin QID Qty: 100 each, Refills: 5    lactulose (CHRONULAC) 10 GM/15ML solution Take 45 mLs (30 g total) by mouth daily as needed for mild constipation. Qty: 240 mL, Refills: 0    levothyroxine (SYNTHROID, LEVOTHROID) 100 MCG tablet TAKE 1 TABLET (100 MCG TOTAL) BY MOUTH DAILY. Qty: 90 tablet, Refills: 1    meclizine (ANTIVERT) 12.5 MG tablet Take 1 tablet (12.5 mg total) by mouth daily as needed for dizziness (vertigo). Qty: 30 tablet, Refills: 0    Nutritional Supplements (ENSURE PO) Take 1 Can by mouth daily as needed (meal replacment).    ONE TOUCH ULTRA TEST test strip TEST BLOOD SUGAR 5-6 TIMES A DAY E11.9 Qty: 600 each, Refills: 3    polyethylene glycol (MIRALAX / GLYCOLAX) packet Take 17 g by mouth every morning.    psyllium (HYDROCIL/METAMUCIL) 95 % PACK Take 1 packet by mouth 3 (three) times daily.    telmisartan (MICARDIS) 40 MG tablet Take 40 mg by mouth daily as needed (blood pressure over 180).    tiotropium (SPIRIVA HANDIHALER) 18 MCG inhalation capsule Place 1 capsule (18 mcg total) into inhaler and inhale daily. Qty: 90 capsule, Refills: 3      STOP taking these medications     HYDROcodone-acetaminophen (NORCO/VICODIN) 5-325 MG per tablet      insulin NPH Human (HUMULIN N,NOVOLIN N) 100 UNIT/ML injection      UNABLE TO FIND          Brief H and P: For complete details please refer to admission H and P, but in brief Patient is a 83 year old female with dementia, COPD, PMR on prednisone, hypothyroidism, diabetes presented with confusion, vomiting and fecal incontinence.She had a recent UTI (PCP diagnosed by phone based on foul-smelling urine), culture grew enterococcus, treated with amoxicillin for 5 days. Patient was admitted for further workup.  Hospital Course:   Patient was initially suspected to have sepsis from UTI however sepsis ruled out. Urine culture showed  showed 20,000 colonies of  yeast. She was placed on IV Rocephin and IV vancomycin.  COPD exacerbation, aspiration pneumonia Patient was noticed to have aspiration pneumonitis and coarse breath sounds, coughing with eating. She was placed on duo nebs, Mucinex and IV Solu-Medrol. Speech therapy was consulted. - Swallow evaluation showed moderate degree of aspiration. Antibiotics were transitioned to IV Unasyn. - FEES was done and patient was transitioned to dysphagia 2 diet with thin liquids per speech therapy recommendations. - transition to Augmentin at the time of discharge and aspiration precautions, prednisone with taper  Aspiration/dysphagia - On dysphagia 2 diet with thin liquids per speech therapy recommendations   Hyponatremia: - Mild, in setting of sepsis.   Anemia: Chronic, normocytic. At baseline.   Insulin dependent diabetes: with hyperglycemia scan due to steroids - Continue sliding scale insulin  PMR: -Continue prednisone with taper, once taper is complete, patient will continue her  maintenance dose of 5 mg daily  Chronic back pain: -Continue fentanyl patch  Hypertension and coronary secondary prevention: -Continue aspirin, amlodipine, Coreg  Hypothyroidism: -Continue levothyroxine   Day of Discharge BP 138/85   Pulse (!) 59   Temp 98.6 F (37 C) (Oral)   Resp (!) 22   Ht 5\' 1"  (1.549 m)   Wt 54.1 kg (119 lb 4.3 oz)   SpO2 100%   BMI 22.54 kg/m   Physical Exam: General: Alert and awake oriented x3 not in any acute distress. HEENT: anicteric sclera, pupils reactive to light and accommodation CVS: S1-S2 clear no murmur rubs or gallops Chest: clear to auscultation bilaterally, no wheezing rales or rhonchi Abdomen: soft nontender, nondistended, normal bowel sounds Extremities: no cyanosis, clubbing or edema noted bilaterally Neuro: Cranial nerves II-XII intact, no focal neurological deficits   The results of significant diagnostics from this hospitalization  (including imaging, microbiology, ancillary and laboratory) are listed below for reference.    LAB RESULTS: Basic Metabolic Panel:  Recent Labs Lab 11/30/16 0254 12/01/16 0339  NA 133* 129*  K 4.2 4.7  CL 103 100*  CO2 21* 17*  GLUCOSE 194* 218*  BUN 39* 39*  CREATININE 1.14* 0.99  CALCIUM 8.4* 8.2*   Liver Function Tests:  Recent Labs Lab 11/27/16 1945 11/28/16 0243  AST 36 42*  ALT 15 19  ALKPHOS 62 59  BILITOT 1.1 1.6*  PROT 6.1* 5.7*  ALBUMIN 3.6 3.4*    Recent Labs Lab 11/27/16 1945  LIPASE 11   No results for input(s): AMMONIA in the last 168 hours. CBC:  Recent Labs Lab 11/27/16 1945  11/30/16 0254 12/01/16 0339  WBC 13.2*  < > 11.6* 5.9  NEUTROABS 11.5*  --   --   --   HGB 10.8*  < > 9.8* 10.2*  HCT 32.3*  < > 29.8* 30.2*  MCV 95.3  < > 93.4 94.7  PLT 244  < > 193 143*  < > = values in this interval not displayed. Cardiac Enzymes: No results for input(s): CKTOTAL, CKMB, CKMBINDEX, TROPONINI in the last 168 hours. BNP: Invalid input(s): POCBNP CBG:  Recent Labs Lab 12/01/16 2121 12/02/16 0818  GLUCAP 213* 145*    Significant Diagnostic Studies:  Dg Chest 2 View  Result Date: 11/27/2016 CLINICAL DATA:  83 year old female with history of nausea, vomiting and diarrhea. EXAM: CHEST  2 VIEW COMPARISON:  Chest x-ray 10/01/2016. FINDINGS: Lung volumes are low. No consolidative airspace disease. No pleural effusions. No pneumothorax. No pulmonary nodule or mass noted. Pulmonary vasculature and the cardiomediastinal silhouette are within normal limits. Aortic atherosclerosis. Left-sided pacemaker device in place with lead tips projecting over the expected location of the right atrium and right ventricular apex. Orthopedic fixation hardware in the lower cervical spine incidentally noted. IMPRESSION: 1. Low lung volumes without radiographic evidence of acute cardiopulmonary disease. 2. Aortic atherosclerosis. Electronically Signed   By: Vinnie Langton  M.D.   On: 11/27/2016 19:21   Ct Abdomen Pelvis W Contrast  Result Date: 11/28/2016 CLINICAL DATA:  Altered mental status vomiting EXAM: CT ABDOMEN AND PELVIS WITH CONTRAST TECHNIQUE: Multidetector CT imaging of the abdomen and pelvis was performed using the standard protocol following bolus administration of intravenous contrast. CONTRAST:  173mL ISOVUE-300 IOPAMIDOL (ISOVUE-300) INJECTION 61% COMPARISON:  None. FINDINGS: Lower chest: Lung bases demonstrate patchy dependent atelectasis at the left base. No pleural effusion. Cardiomegaly is present. Partially visualized pacer leads. Dense mitral calcifications. Hepatobiliary: No focal hepatic abnormality. Surgical clips in  the gallbladder fossa. No biliary dilatation Pancreas: Unremarkable. No pancreatic ductal dilatation or surrounding inflammatory changes. Spleen: Normal in size without focal abnormality. Adrenals/Urinary Tract: Adrenal glands within normal limits. Kidneys show no hydronephrosis. The bladder is enlarged but otherwise unremarkable Stomach/Bowel: The stomach is nonenlarged. There is no dilated small bowel. No colon wall thickening. Appendix normal. Vascular/Lymphatic: Moderate atherosclerotic vascular calcifications of the aorta and branch vessels. No grossly enlarged lymph nodes Reproductive: Status post hysterectomy. No adnexal masses. Other: No free air or free fluid.  Diffuse subcutaneous edema. Musculoskeletal: Degenerative changes of the spine with minimal retrolisthesis of L3 on L4. Minimal anterolisthesis of L4 on L5. Prominent Schmorl's node at L4 superiorly. IMPRESSION: No CT evidence for a bowel obstruction or other acute intra-abdominal or pelvic pathology Electronically Signed   By: Donavan Foil M.D.   On: 11/28/2016 00:00    2D ECHO:   Disposition and Follow-up: Discharge Instructions    Diet Carb Modified    Complete by:  As directed    Increase activity slowly    Complete by:  As directed        DISPOSITION:  [place] [name], MD. Schedule an appointment as soon as possible for a visit in 2 week(s).   Specialty:  Family Medicine Contact information: [address] [place] [address] [phone]            Time spent on Discharge: 25 minutes  Signed:   RAI,RIPUDEEP M.D. [facility] 12/02/2016, 11:15 AM Pager: [phone]  Coding query  Patient was initially admitted with principal diagnosis of sepsis. However sepsis was ruled out. She was discharged with principal diagnosis of aspiration pneumonia and COPD exacerbation,    RAI,RIPUDEEP M.D. [facility] 12/12/2016, 4:24 PM  Pager: [phone]

## 2016-12-02 NOTE — Progress Notes (Addendum)
Patient will discharge to [facility] Anticipated discharge date: 1/23 Family notified: Jackelyn Poling Transportation by Corey Harold- scheduled for 3:15pm Report #: [phone] rm [place] signing off.  Jorge Ny, LCSW Clinical Social Worker [phone]

## 2016-12-02 NOTE — Progress Notes (Signed)
Attempt to call report to [facility] on hold for 13 mins.  Left message Ardyth Man Adm needing to call report.

## 2016-12-02 NOTE — Progress Notes (Signed)
Report given to [facility] for transfer of patient to [facility].

## 2016-12-02 NOTE — Care Management Important Message (Signed)
Important Message  Patient Details  Name: Michelle [name] MRN: [id number] Date of Birth: 09-Oct-1932   Medicare Important Message Given:  Yes    [name] 12/02/2016, 1:05 PM

## 2016-12-02 NOTE — Progress Notes (Signed)
Speech Language Pathology Treatment: Dysphagia  Patient Details Name: Michelle [name] MRN: [id number] DOB: 05-17-1932 Today's Date: 12/02/2016 Time: 7915-0569 SLP Time Calculation (min) (ACUTE ONLY): 37 min  Assessment / Plan / Recommendation Clinical Impression  Pt with excellent toleration today of current diet - observed with pudding and water; pt fed herself with adequate attention to bolus, swift swallow, no s/s of aspiration. Family present - answered questions about dementia and its ultimate impact upon swallowing, likelihood of intermittent aspiration, and the fluctuating nature of feeding/swallowing with this dx.  Recommend D/C on current diet.    HPI HPI: Michelle [name] a 83 y.o.femalewith a past medical history significant for Alzheimers dementia with known history of conduction system disease, sinus node disease, COPD, PMR on pred 5, hypothyroidism, IDDM and chronic back pain on fentanyl patch 67mgwho presents with confusion, vomiting and fecal incontinence for 1 day. The patient started to vomit and have loose stools a day or two ago.She had a recent UTI (PCP diagnosed by phone based on foul-smelling urine), culture grew enterococcus, treated with amoxicillin for 5 days. Chest X ray with no evidence of acute cardiopulmonary disease. She has been seen by SLP during prior admissions in May and Nov. 2017. Swallowing deficits noted included oral holding likely 2/2 dementia. She was discharged on dysphagia 2/thin liquids during last admission.      SLP Plan  All goals met     Recommendations  Diet recommendations: Dysphagia 2 (fine chop);Thin liquid Liquids provided via: Cup;Straw Medication Administration: Whole meds with puree Supervision: Patient able to self feed;Intermittent supervision to cue for compensatory strategies Compensations: Slow rate;Small sips/bites;Minimize environmental distractions Postural Changes and/or Swallow Maneuvers: Seated upright 90 degrees              Oral Care Recommendations: Oral care BID Follow up Recommendations: Skilled Nursing facility Plan: All goals met       GO                Michelle [name] 12/02/2016, 3:55 PM

## 2016-12-02 NOTE — Progress Notes (Signed)
Report given [place], LPN at [facility] for transfer to patient.

## 2016-12-02 NOTE — Progress Notes (Signed)
CSW spoke with pt dtr, Debbie, to confirm choice for bed.  Dtr states she is still not sure between [facility] and [facility] and would like to visit them quickly before making a final decision- she will call CSW with choice by 2pm  CSW continuing to follow  Jorge Ny, [place] Social Worker [phone]

## 2016-12-04 ENCOUNTER — Encounter: Payer: Self-pay | Admitting: Internal Medicine

## 2016-12-04 ENCOUNTER — Non-Acute Institutional Stay (SKILLED_NURSING_FACILITY): Payer: Medicare Other | Admitting: Internal Medicine

## 2016-12-04 DIAGNOSIS — M545 Low back pain: Secondary | ICD-10-CM

## 2016-12-04 DIAGNOSIS — R1310 Dysphagia, unspecified: Secondary | ICD-10-CM

## 2016-12-04 DIAGNOSIS — G8929 Other chronic pain: Secondary | ICD-10-CM

## 2016-12-04 DIAGNOSIS — K5909 Other constipation: Secondary | ICD-10-CM

## 2016-12-04 DIAGNOSIS — G309 Alzheimer's disease, unspecified: Secondary | ICD-10-CM

## 2016-12-04 DIAGNOSIS — J690 Pneumonitis due to inhalation of food and vomit: Secondary | ICD-10-CM | POA: Diagnosis not present

## 2016-12-04 DIAGNOSIS — N184 Chronic kidney disease, stage 4 (severe): Secondary | ICD-10-CM

## 2016-12-04 DIAGNOSIS — F0280 Dementia in other diseases classified elsewhere without behavioral disturbance: Secondary | ICD-10-CM

## 2016-12-04 DIAGNOSIS — J441 Chronic obstructive pulmonary disease with (acute) exacerbation: Secondary | ICD-10-CM | POA: Diagnosis not present

## 2016-12-04 DIAGNOSIS — E43 Unspecified severe protein-calorie malnutrition: Secondary | ICD-10-CM | POA: Diagnosis not present

## 2016-12-04 DIAGNOSIS — E871 Hypo-osmolality and hyponatremia: Secondary | ICD-10-CM

## 2016-12-04 DIAGNOSIS — M353 Polymyalgia rheumatica: Secondary | ICD-10-CM

## 2016-12-04 DIAGNOSIS — E039 Hypothyroidism, unspecified: Secondary | ICD-10-CM | POA: Diagnosis not present

## 2016-12-04 DIAGNOSIS — I10 Essential (primary) hypertension: Secondary | ICD-10-CM

## 2016-12-04 DIAGNOSIS — R627 Adult failure to thrive: Secondary | ICD-10-CM

## 2016-12-04 DIAGNOSIS — E1121 Type 2 diabetes mellitus with diabetic nephropathy: Secondary | ICD-10-CM

## 2016-12-04 DIAGNOSIS — D638 Anemia in other chronic diseases classified elsewhere: Secondary | ICD-10-CM

## 2016-12-04 DIAGNOSIS — R5381 Other malaise: Secondary | ICD-10-CM | POA: Diagnosis not present

## 2016-12-04 NOTE — Progress Notes (Signed)
LOCATION: [name]  PCP: Michelle Fraction, MD   Code Status: DNR  Goals of care: Advanced Directive information Advanced Directives 09/28/2016  Does Patient Have a Medical Advance Directive? Yes  Type of Paramedic of [place];Living will  Does patient want to make changes to medical advance directive? No - Patient declined  Copy of [place] in Chart? No - copy requested  Would patient like information on creating a medical advance directive? -       Extended Emergency Contact Information Primary Emergency Contact: [name] [place] of [place] Phone: [phone] Mobile Phone: [phone] Relation: Daughter Secondary Emergency Contact: [name] [place] of [place] Phone: [phone] Work Phone: [phone] Mobile Phone: [phone] Relation: Daughter   No Known Allergies  Chief Complaint  Patient presents with  . New Admit To SNF    New Admission Visit      HPI:  Patient is a 83 y.o. female seen today for short term rehabilitation post hospital admission from 11/27/2016-12/02/2016 with acute respiratory failure from aspiration pneumonia and COPD exacerbation. She was started on antibiotic, steroids and breathing treatments. She was seen by speech therapist for dysphagia and underwent FEES study. She has medical history of PMR, chronic back pain, hypertension, hypothyroidism, diabetes mellitus among others. She is seen in her room today with her daughter at bedside.  Review of Systems:  Constitutional: Negative for fever, chills.  HENT: Negative for headache, congestion, nasal discharge, sore throat, difficulty swallowing.   Eyes: Negative for eye pain, blurred vision, double vision and discharge. Wears glasses Respiratory: Negative for shortness of breath. Positive for cough with phlegm.   Cardiovascular: Negative for chest pain, palpitations, leg swelling.  Gastrointestinal:  Negative for heartburn, vomiting, abdominal pain. Positive for nausea. Doesn't remember her last bowel movement.   Genitourinary: Negative for dysuria Musculoskeletal: Negative for back pain Skin: Negative for itching, rash.  Neurological: Negative for dizziness. Psychiatric/Behavioral: pleasantly confused   Past Medical History:  Diagnosis Date  . Arthritis   . Asthma   . COPD (chronic obstructive pulmonary disease) ([place])   . Coronary atherosclerosis   . Dyslipidemia   . Gallstones   . History of fractured kneecap   . Hypothyroidism   . Interstitial cystitis   . Malaise and fatigue   . Memory loss   . Polymyalgia rheumatica ([place])   . Systemic hypertension   . Type II or unspecified type diabetes mellitus without mention of complication, not stated as uncontrolled   . UTI (lower urinary tract infection)    Past Surgical History:  Procedure Laterality Date  . CARDIAC CATHETERIZATION  01/29/2006   10% luminal irregularity mid LAD  . CARDIAC CATHETERIZATION  03/15/2001   No significant CAD, no evidence of renal artery stenosis, systemic hypertenion  . CARDIAC CATHETERIZATION N/A 08/21/2016   Procedure: Temporary Pacemaker;  Surgeon: Michelle Booze, MD;  Location: [facility];  Service: Cardiovascular;  Laterality: N/A;  . CARDIAC CATHETERIZATION N/A 09/28/2016   Procedure: Temporary Pacemaker;  Surgeon: Michelle Blanks, MD;  Location: [facility];  Service: Cardiovascular;  Laterality: N/A;  . CERVICAL SPINE SURGERY    . CHOLECYSTECTOMY    . EP IMPLANTABLE DEVICE N/A 09/29/2016   Procedure: Pacemaker Implant;  Surgeon: Michelle Lance, MD;  Location: [facility];  Service: Cardiovascular;  Laterality: N/A;  . HERNIA REPAIR    . LUMBAR DISC SURGERY    . NM MYOCAR PERF WALL MOTION  08/17/2009   normal  . ROTATOR CUFF REPAIR    . TONSILLECTOMY    . TOTAL ABDOMINAL HYSTERECTOMY    . US ECHOCARDIOGRAPHY  09/14/2007   mild LVH, LA mildly dilated,mild  mitral annular calcification, AOV mildly sclerotic, aortic root sclerosis/ca+   Social History:   reports that she has never smoked. She has never used smokeless tobacco. She reports that she does not drink alcohol or use drugs.  Family History  Problem Relation Age of Onset  . Breast cancer Mother   . Heart disease Father   . Lung cancer Brother     Medications: Allergies as of 12/04/2016   No Known Allergies     Medication List       Accurate as of 12/04/16  2:11 PM. Always use your most recent med list.          acetaminophen 325 MG tablet Commonly known as:  TYLENOL Take 2 tablets (650 mg total) by mouth every 6 (six) hours as needed for mild pain (or Fever >/= 101).   albuterol 108 (90 Base) MCG/ACT inhaler Commonly known as:  PROVENTIL HFA;VENTOLIN HFA Inhale 2 puffs into the lungs every 4 (four) hours as needed for wheezing or shortness of breath.   amLODipine 2.5 MG tablet Commonly known as:  NORVASC Take 1 tablet (2.5 mg total) by mouth daily.   amoxicillin-clavulanate 875-125 MG tablet Commonly known as:  AUGMENTIN Take 1 tablet by mouth 2 (two) times daily. X 5 days   aspirin 81 MG tablet Take 81 mg by mouth daily.   carvedilol 6.25 MG tablet Commonly known as:  COREG Take 6.25 mg by mouth 2 (two) times daily with a meal.   diphenhydramine-acetaminophen 25-500 MG Tabs tablet Commonly known as:  TYLENOL PM Take 2 tablets by mouth at bedtime.   fentaNYL 25 MCG/HR patch Commonly known as:  DURAGESIC - dosed mcg/hr Place 1 patch (25 mcg total) onto the skin every 3 (three) days.   glucagon 1 MG injection Inject 1 mg into the vein once as needed.   guaiFENesin 600 MG 12 hr tablet Commonly known as:  MUCINEX Take 1 tablet (600 mg total) by mouth 2 (two) times daily.   insulin lispro 100 UNIT/ML injection Commonly known as:  HUMALOG Inject 0-0.09 mLs (0-9 Units total) into the skin 3 (three) times daily with meals. Sliding scale CBG 70 - 120: 0 units  CBG 121 - 150: 1 unit,  CBG 151 - 200: 2 units,  CBG 201 - 250: 3 units,  CBG 251 - 300: 5 units,  CBG 301 - 350: 7 units,  CBG 351 - 400: 9 units   CBG > 400: 9 units and notify your MD   Insulin Syringe-Needle U-100 31G X 5/16" 1 ML Misc For injection of insulin QID   ipratropium-albuterol 0.5-2.5 (3) MG/3ML Soln Commonly known as:  DUONEB Take 3 mLs by nebulization 3 (three) times daily.   lactulose 10 GM/15ML solution Commonly known as:  CHRONULAC Take 45 mLs (30 g total) by mouth daily as needed for mild constipation.   levothyroxine 100 MCG tablet Commonly known as:  SYNTHROID, LEVOTHROID TAKE 1 TABLET (100 MCG TOTAL) BY MOUTH DAILY.   LORazepam 1 MG tablet Commonly known as:  ATIVAN Take 1 tablet (1 mg total) by mouth every 8 (eight) hours as needed for anxiety.   meclizine 12.5 MG tablet Commonly known as:  ANTIVERT Take 1 tablet (12.5 mg total) by mouth daily as needed for dizziness (  vertigo).   ONE TOUCH ULTRA TEST test strip Generic drug:  glucose blood TEST BLOOD SUGAR 5-6 TIMES A DAY E11.9   polyethylene glycol packet Commonly known as:  MIRALAX / GLYCOLAX Take 17 g by mouth every morning.   predniSONE 5 MG tablet Commonly known as:  DELTASONE Take 1 tablet (5 mg total) by mouth daily. Please continue after prednisone taper is completed   predniSONE 10 MG tablet Commonly known as:  DELTASONE Prednisone dosing: Take  Prednisone 40mg  (4 tabs) x 2 days, then taper to 30mg  (3 tabs) x 2 days, then 20mg  (2 tabs) x 2days, then 10mg  (1 tab) x 2days, then continue your maintenance dose of 5 mg daily   psyllium 95 % Pack Commonly known as:  HYDROCIL/METAMUCIL Take 1 packet by mouth 3 (three) times daily.   telmisartan 40 MG tablet Commonly known as:  MICARDIS Take 40 mg by mouth daily as needed (blood pressure over 180).   tiotropium 18 MCG inhalation capsule Commonly known as:  SPIRIVA HANDIHALER Place 1 capsule (18 mcg total) into inhaler and inhale daily.     traMADol 50 MG tablet Commonly known as:  ULTRAM Take 1 tablet (50 mg total) by mouth daily as needed for moderate pain.   UNABLE TO FIND Med Name: Med pass 120 mL by mouth daily as needed       Immunizations: Immunization History  Administered Date(s) Administered  . Influenza Split 08/22/2011, 08/10/2012, 08/10/2013, 08/10/2014  . Influenza Whole 08/10/2010  . Influenza,inj,Quad PF,36+ Mos 07/24/2016  . PPD Test 12/03/2016  . Pneumococcal Polysaccharide-23 02/21/2010     Physical Exam: Vitals:   12/04/16 1402  BP: 136/84  Pulse: 62  Resp: 20  Temp: 98.6 F (37 C)  TempSrc: Oral  SpO2: 96%  Weight: 119 lb 3.2 oz (54.1 kg)  Height: 5\' 1"  (1.549 m)   Body mass index is 22.52 kg/m.  General- elderly female, frail and ill appearing, in no acute distress Head- normocephalic, atraumatic Nose- no maxillary or frontal sinus tenderness, no nasal discharge Throat- moist mucus membrane  Eyes- PERRLA, EOMI, no pallor, no icterus, no discharge, normal conjunctiva, normal sclera Neck- no cervical lymphadenopathy Cardiovascular- normal s1,s2, no murmur Respiratory- bilateral decreased air entry, no wheeze, no rhonchi, no crackles, no use of accessory muscles Abdomen- bowel sounds present, soft, non tender Musculoskeletal- able to move all 4 extremities, generalized weakness, trace leg edema Neurological- alert and oriented to self only Skin- warm and dry, easy bruising, chronic skin changes to her lower legs Psychiatry- normal mood and affect    Labs reviewed: Basic Metabolic Panel:  Recent Labs  03/12/16 1147  09/29/16 0211  11/29/16 0532 11/30/16 0254 12/01/16 0339  NA 135  < > 134*  < > 134* 133* 129*  K 4.0  < > 5.0  < > 4.7 4.2 4.7  CL 107  < > 102  < > 104 103 100*  CO2 18*  < > 22  < > 14* 21* 17*  GLUCOSE 352*  < > 90  < > 337* 194* 218*  BUN 23*  < > 43*  < > 41* 39* 39*  CREATININE 1.38*  < > 2.31*  < > 1.49* 1.14* 0.99  CALCIUM 8.1*  < > 8.8*  < >  8.4* 8.4* 8.2*  MG  --   --  2.0  --   --   --   --   PHOS 1.8*  --  4.4  --   --   --   --   < > =  values in this interval not displayed. Liver Function Tests:  Recent Labs  10/01/16 0450 11/27/16 1945 11/28/16 0243  AST 360* 36 42*  ALT 304* 15 19  ALKPHOS 91 62 59  BILITOT 0.6 1.1 1.6*  PROT 5.0* 6.1* 5.7*  ALBUMIN 2.6* 3.6 3.4*    Recent Labs  09/28/16 1551 10/01/16 1241 11/27/16 1945  LIPASE 28 30 11     Recent Labs  08/24/16 1129  AMMONIA 16   CBC:  Recent Labs  09/28/16 1551  10/16/16 0515 11/27/16 1945  11/29/16 0532 11/30/16 0254 12/01/16 0339  WBC 15.6*  < > 7.4 13.2*  < > 16.8* 11.6* 5.9  NEUTROABS 13.4*  --  4.9 11.5*  --   --   --   --   HGB 9.4*  < > 10.8* 10.8*  < > 10.0* 9.8* 10.2*  HCT 27.4*  < > 31.4* 32.3*  < > 30.3* 29.8* 30.2*  MCV 92.3  < > 93.1 95.3  < > 95.3 93.4 94.7  PLT 354  < > 255 244  < > 228 193 143*  < > = values in this interval not displayed. Cardiac Enzymes:  Recent Labs  08/21/16 0214 08/21/16 0603 08/21/16 1357  TROPONINI 0.15* 0.17* 0.20*   BNP: Invalid input(s): POCBNP CBG:  Recent Labs  12/01/16 2121 12/02/16 0818 12/02/16 1213  GLUCAP 213* 145* 185*    Radiological Exams: Dg Chest 2 View  Result Date: 11/27/2016 CLINICAL DATA:  83 year old female with history of nausea, vomiting and diarrhea. EXAM: CHEST  2 VIEW COMPARISON:  Chest x-ray 10/01/2016. FINDINGS: Lung volumes are low. No consolidative airspace disease. No pleural effusions. No pneumothorax. No pulmonary nodule or mass noted. Pulmonary vasculature and the cardiomediastinal silhouette are within normal limits. Aortic atherosclerosis. Left-sided pacemaker device in place with lead tips projecting over the expected location of the right atrium and right ventricular apex. Orthopedic fixation hardware in the lower cervical spine incidentally noted. IMPRESSION: 1. Low lung volumes without radiographic evidence of acute cardiopulmonary disease. 2.  Aortic atherosclerosis. Electronically Signed   By: Vinnie Langton M.D.   On: 11/27/2016 19:21   Ct Abdomen Pelvis W Contrast  Result Date: 11/28/2016 CLINICAL DATA:  Altered mental status vomiting EXAM: CT ABDOMEN AND PELVIS WITH CONTRAST TECHNIQUE: Multidetector CT imaging of the abdomen and pelvis was performed using the standard protocol following bolus administration of intravenous contrast. CONTRAST:  176mL ISOVUE-300 IOPAMIDOL (ISOVUE-300) INJECTION 61% COMPARISON:  None. FINDINGS: Lower chest: Lung bases demonstrate patchy dependent atelectasis at the left base. No pleural effusion. Cardiomegaly is present. Partially visualized pacer leads. Dense mitral calcifications. Hepatobiliary: No focal hepatic abnormality. Surgical clips in the gallbladder fossa. No biliary dilatation Pancreas: Unremarkable. No pancreatic ductal dilatation or surrounding inflammatory changes. Spleen: Normal in size without focal abnormality. Adrenals/Urinary Tract: Adrenal glands within normal limits. Kidneys show no hydronephrosis. The bladder is enlarged but otherwise unremarkable Stomach/Bowel: The stomach is nonenlarged. There is no dilated small bowel. No colon wall thickening. Appendix normal. Vascular/Lymphatic: Moderate atherosclerotic vascular calcifications of the aorta and branch vessels. No grossly enlarged lymph nodes Reproductive: Status post hysterectomy. No adnexal masses. Other: No free air or free fluid.  Diffuse subcutaneous edema. Musculoskeletal: Degenerative changes of the spine with minimal retrolisthesis of L3 on L4. Minimal anterolisthesis of L4 on L5. Prominent Schmorl's node at L4 superiorly. IMPRESSION: No CT evidence for a bowel obstruction or other acute intra-abdominal or pelvic pathology Electronically Signed   By: Donavan Foil M.D.   On:  11/28/2016 00:00    Assessment/Plan  Physical deconditioning With generalized weakness. Will have her to work with physical therapy and occupational  therapy as tolerated to help regain strength in place. Overall poor prognosis. Palliative care consult to be placed. Reviewed goals of care for daughter who is present at bedside this visit.  Aspiration pneumonia Continue and complete course of Augmentin 875 mg every 12 hours on 28th of January 2018. Aspiration precautions to be taken patient is a high risk for recurrent aspiration. Continue Mucinex for cough  Dysphagia To be followed by speech therapy. Assistance with feeding to be provided. Aspiration precautions to be taken.  COPD exacerbation With recent COPD exacerbation. Continue and complete tapering course of prednisone on 12/10/2016 followed by chronic prednisone 5 mg daily. Continue Spiriva and Proventil. Provide duonebs on a needed basis. Continue oxygen by nasal cannula for now and wean off as tolerated. Monitor oxygen saturation.  Failure to thrive Poor prognosis with aspiration pneumonia and chronic COPD. Per family the goal of care is to focus on comfort care. Will get palliative care consult for now. Have patient to work with therapy to. If fails to make progress or has ongoing decline, will get hospice consult.  Severe protein calorie malnutrition Get registered dietitian to evaluate, full assistance with each meal and to encourage oral intake. Weekly weight to be monitored.  Alzheimer's dementia No acute behavior disturbance. Provide supportive care. Fall precautions and pressure ulcer prophylaxis to be provided  Hyponatremia Monitor BMP   Hypertension Monitor blood pressure reading: Continue Coreg 6.25 mg twice a day and amlodipine 2.5 mg daily. Also on Micardis 40 mg daily as needed stable blood pressure reading this visit.  Chronic back pain Continue fentanyl patch every 72 hours and tramadol 50 mg daily as needed. Continue Tylenol extra strength 2 tablet at bedtime.  Anemia of chronic disease Monitor CBC periodically  Hypothyroidism Continue levothyroxine 100 g  daily  Chronic constipation Continue Metamucil and MiraLAX with lactulose as necessary and monitor  PMR Continue prednisone 5 mg daily.  Chronic kidney disease stage IV Check BMP  Type 2 diabetes mellitus with nephropathy Has elevated blood sugar reading in 400 today prior to lunch. Currently on sliding scale insulin Humalog. Patient currently on prednisone which could be contribution to some to elevated blood sugar reading. A1c is suggestive of controlled diabetes continue current sliding scale insulin Humalog and monitor Lab Results  Component Value Date   HGBA1C 6.0 (H) 11/28/2016     Goals of care: short term rehabilitation, possible long-term care   Labs/tests ordered: CBC, BMP 12/08/2016   Family/ staff Communication: reviewed care plan with patient, her daughter and nursing supervisor    Blanchie Serve, MD Internal Medicine [address], [place] [address] Cell Phone (Monday-Friday 8 am - 5 pm): [phone] On Call: [phone] and follow prompts after 5 pm and on weekends Office Phone: [phone] Office Fax: [phone]

## 2016-12-05 ENCOUNTER — Non-Acute Institutional Stay (SKILLED_NURSING_FACILITY): Payer: Medicare Other | Admitting: Family

## 2016-12-05 ENCOUNTER — Encounter: Payer: Self-pay | Admitting: Family

## 2016-12-05 DIAGNOSIS — E1121 Type 2 diabetes mellitus with diabetic nephropathy: Secondary | ICD-10-CM

## 2016-12-05 DIAGNOSIS — K5901 Slow transit constipation: Secondary | ICD-10-CM

## 2016-12-05 DIAGNOSIS — Z794 Long term (current) use of insulin: Secondary | ICD-10-CM | POA: Diagnosis not present

## 2016-12-05 NOTE — Progress Notes (Signed)
Location:  [place] Room Number: 1203 Place of Service:  SNF (31) Provider:  Marlowe Sax, FNP-C   [name], MD  Patient Care Team: Susy Frizzle, MD as PCP - General (Family Medicine)  Extended Emergency Contact Information Primary Emergency Contact: [name] of [place] Phone: [phone] Mobile Phone: [phone] Relation: Daughter Secondary Emergency Contact: Nelson,Debbie Address: [address] [place],  [address] [place] of [place] Phone: [phone] Work Phone: [phone] Mobile Phone: [phone] Relation: Daughter  Code Status:  DNR Goals of care: Advanced Directive information Advanced Directives 12/05/2016  Does Patient Have a Medical Advance Directive? Yes  Type of Advance Directive Out of facility DNR (pink MOST or yellow form)  Does patient want to make changes to medical advance directive? -  Copy of [place] in Chart? -  Would patient like information on creating a medical advance directive? -  Pre-existing out of facility DNR order (yellow form or pink MOST form) Yellow form placed in chart (order not valid for inpatient use)     Chief Complaint  Patient presents with  . Acute Visit    blood sugar this morning 553    HPI:  Pt is a 83 y.o. female seen today at [facility] for an acute visit for evaluation of elevated blood sugars. She has a significant medical history of HTN, Type 2 DM,Hypothyroidism, CAD among other conditions. She is seen in her room today with family members at bedside. Facility Nurse reports patient's CBG was 553 this morning 9 units of Humalog per SSI given and additional 5 units. CBG rechecked was in the 400's. Patient daughter states patient was not feeling well and sluggish yesterday but much better today.Also states patient was on Humalin N 18 units in the morning and 6 units at bedtime at  home along with Humalog SSI. She is currently on tapered Prednisone for PMR post hospital admission.Patient complains of no bowel movement for the past three days. She denies any other acute  issues.     Past Medical History:  Diagnosis Date  . Arthritis   . Asthma   . COPD (chronic obstructive pulmonary disease) ([place])   . Coronary atherosclerosis   . Dyslipidemia   . Gallstones   . History of fractured kneecap   . Hypothyroidism   . Interstitial cystitis   . Malaise and fatigue   . Memory loss   . Polymyalgia rheumatica ([place])   . Systemic hypertension   . Type II or unspecified type diabetes mellitus without mention of complication, not stated as uncontrolled   . UTI (lower urinary tract infection)    Past Surgical History:  Procedure Laterality Date  . CARDIAC CATHETERIZATION  01/29/2006   10% luminal irregularity mid LAD  . CARDIAC CATHETERIZATION  03/15/2001   No significant CAD, no evidence of renal artery stenosis, systemic hypertenion  . CARDIAC CATHETERIZATION N/A 08/21/2016   Procedure: Temporary Pacemaker;  Surgeon: Jettie Booze, MD;  Location: [place] CV LAB;  Service: Cardiovascular;  Laterality: N/A;  . CARDIAC CATHETERIZATION N/A 09/28/2016   Procedure: Temporary Pacemaker;  Surgeon: Burnell Blanks, MD;  Location: [place] CV LAB;  Service: Cardiovascular;  Laterality: N/A;  . CERVICAL SPINE SURGERY    . CHOLECYSTECTOMY    . EP IMPLANTABLE DEVICE N/A 09/29/2016   Procedure: Pacemaker Implant;  Surgeon: Evans Lance, MD;  Location:  [place] INVASIVE CV LAB;  Service: Cardiovascular;  Laterality: N/A;  . HERNIA REPAIR    . LUMBAR DISC SURGERY    . NM MYOCAR PERF WALL MOTION  08/17/2009   normal  . ROTATOR CUFF REPAIR    . TONSILLECTOMY    . TOTAL ABDOMINAL HYSTERECTOMY    . US ECHOCARDIOGRAPHY  09/14/2007   mild LVH, LA mildly dilated,mild mitral annular calcification, AOV mildly sclerotic, aortic root sclerosis/ca+    No Known  Allergies  Allergies as of 12/05/2016   No Known Allergies     Medication List       Accurate as of 12/05/16  1:57 PM. Always use your most recent med list.          acetaminophen 325 MG tablet Commonly known as:  TYLENOL Take 650 mg by mouth. Take 2 tabs every 6 hours as needed for mild pain   albuterol 108 (90 Base) MCG/ACT inhaler Commonly known as:  PROVENTIL HFA;VENTOLIN HFA Inhale 2 puffs into the lungs every 4 (four) hours as needed for wheezing or shortness of breath.   amLODipine 2.5 MG tablet Commonly known as:  NORVASC Take 1 tablet (2.5 mg total) by mouth daily.   amoxicillin-clavulanate 875-125 MG tablet Commonly known as:  AUGMENTIN Take 1 tablet by mouth 2 (two) times daily. X 5 days   aspirin 81 MG tablet Take 81 mg by mouth daily.   carvedilol 6.25 MG tablet Commonly known as:  COREG Take 6.25 mg by mouth 2 (two) times daily with a meal.   diphenhydramine-acetaminophen 25-500 MG Tabs tablet Commonly known as:  TYLENOL PM Take 2 tablets by mouth at bedtime.   fentaNYL 25 MCG/HR patch Commonly known as:  DURAGESIC - dosed mcg/hr Place 1 patch (25 mcg total) onto the skin every 3 (three) days.   glucagon 1 MG injection Inject 1 mg into the vein once as needed.   insulin lispro 100 UNIT/ML injection Commonly known as:  HUMALOG Inject 0-0.09 mLs (0-9 Units total) into the skin 3 (three) times daily with meals. Sliding scale CBG 70 - 120: 0 units CBG 121 - 150: 1 unit,  CBG 151 - 200: 2 units,  CBG 201 - 250: 3 units,  CBG 251 - 300: 5 units,  CBG 301 - 350: 7 units,  CBG 351 - 400: 9 units   CBG > 400: 9 units and notify your MD   Insulin Syringe-Needle U-100 31G X 5/16" 1 ML Misc For injection of insulin QID   ipratropium-albuterol 0.5-2.5 (3) MG/3ML Soln Commonly known as:  DUONEB Take 3 mLs by nebulization 3 (three) times daily.   lactulose 10 GM/15ML solution Commonly known as:  CHRONULAC Take 45 mLs (30 g total) by mouth daily as needed for  mild constipation.   levothyroxine 100 MCG tablet Commonly known as:  SYNTHROID, LEVOTHROID TAKE 1 TABLET (100 MCG TOTAL) BY MOUTH DAILY.   LORazepam 1 MG tablet Commonly known as:  ATIVAN Take 1 tablet (1 mg total) by mouth every 8 (eight) hours as needed for anxiety.   meclizine 12.5 MG tablet Commonly known as:  ANTIVERT Take 1 tablet (12.5 mg total) by mouth daily as needed for dizziness (vertigo).   MUCINEX 600 MG 12 hr tablet Generic drug:  guaiFENesin Take 600 mg by mouth. Take one tablet daily   ONE TOUCH ULTRA TEST test strip Generic drug:  glucose blood TEST BLOOD SUGAR 5-6 TIMES A DAY E11.9   polyethylene glycol packet Commonly known as:  MIRALAX / GLYCOLAX Take 17 g by mouth every morning.   predniSONE 5 MG tablet Commonly known as:  DELTASONE Take 1 tablet (5 mg total) by mouth daily. Please continue after prednisone taper is completed   predniSONE 10 MG tablet Commonly known as:  DELTASONE Prednisone dosing: Take  Prednisone 40mg  (4 tabs) x 2 days, then taper to 30mg  (3 tabs) x 2 days, then 20mg  (2 tabs) x 2days, then 10mg  (1 tab) x 2days, then continue your maintenance dose of 5 mg daily   psyllium 95 % Pack Commonly known as:  HYDROCIL/METAMUCIL Take 1 packet by mouth 3 (three) times daily.   telmisartan 40 MG tablet Commonly known as:  MICARDIS Take 40 mg by mouth daily as needed (blood pressure over 180).   tiotropium 18 MCG inhalation capsule Commonly known as:  SPIRIVA HANDIHALER Place 1 capsule (18 mcg total) into inhaler and inhale daily.   traMADol 50 MG tablet Commonly known as:  ULTRAM Take 1 tablet (50 mg total) by mouth daily as needed for moderate pain.   UNABLE TO FIND Med Name: Med pass 120 mL by mouth daily as needed       Review of Systems  Constitutional: Positive for fatigue. Negative for activity change, appetite change, chills and fever.  HENT: Negative for congestion, rhinorrhea, sinus pain, sinus pressure, sneezing and  sore throat.   Eyes: Negative.   Respiratory: Negative for cough, chest tightness, shortness of breath and wheezing.   Cardiovascular: Negative for chest pain, palpitations and leg swelling.  Gastrointestinal: Negative for abdominal distention, abdominal pain, constipation, diarrhea, nausea and vomiting.  Endocrine: Negative for cold intolerance, heat intolerance, polydipsia, polyphagia and polyuria.  Genitourinary: Negative for dysuria, flank pain, frequency and urgency.  Musculoskeletal: Positive for gait problem.  Skin: Negative for color change, pallor and rash.  Neurological: Negative for dizziness, seizures, syncope and headaches.  Psychiatric/Behavioral: Negative for agitation, confusion, hallucinations and sleep disturbance. The patient is not nervous/anxious.     Immunization History  Administered Date(s) Administered  . Influenza Split 08/22/2011, 08/10/2012, 08/10/2013, 08/10/2014  . Influenza Whole 08/10/2010  . Influenza,inj,Quad PF,36+ Mos 07/24/2016  . PPD Test 12/03/2016  . Pneumococcal Polysaccharide-23 02/21/2010   Pertinent  Health Maintenance Due  Topic Date Due  . FOOT EXAM  10/12/1942  . OPHTHALMOLOGY EXAM  10/12/1942  . DEXA SCAN  10/13/1999  . PNA vac Low Risk Adult (2 of 2 - PCV13) 02/22/2011  . HEMOGLOBIN A1C  05/28/2017  . INFLUENZA VACCINE  Completed   Fall Risk  09/05/2016  Falls in the past year? No    Vitals:   12/05/16 1300  BP: (!) 164/102  Pulse: (!) 57  Resp: 16  Temp: 98.7 F (37.1 C)  SpO2: 99%  Weight: 135 lb (61.2 kg)  Height: 5\' 1"  (1.549 m)   Body mass index is 25.51 kg/m. Physical Exam  Constitutional: She appears well-developed and well-nourished. No distress.  HENT:  Head: Normocephalic.  Mouth/Throat: Oropharynx is clear and moist. No oropharyngeal exudate.  Eyes: Conjunctivae and EOM are normal. Pupils are equal, round, and reactive to light. Right eye exhibits no discharge. Left eye exhibits no discharge. No scleral  icterus.  Neck: Normal range of motion. No JVD present. No thyromegaly present.  Cardiovascular: Normal rate, regular rhythm and intact distal pulses.  Exam reveals no gallop and no friction rub.   No murmur heard. Pulmonary/Chest: Effort normal and breath sounds normal. No respiratory distress. She has no wheezes. She has no rales.  Abdominal: Soft. Bowel sounds are normal. She exhibits no distension. There is no tenderness. There is no rebound and no guarding.  Musculoskeletal: She exhibits no edema, tenderness or deformity.  Unsteady gait   Lymphadenopathy:    She has no cervical adenopathy.  Neurological: She is alert.  Skin: Skin is warm and dry. No rash noted. No erythema. No pallor.  Psychiatric: She has a normal mood and affect.    Labs reviewed:  Recent Labs  03/12/16 1147  09/29/16 0211  11/29/16 0532 11/30/16 0254 12/01/16 0339  NA 135  < > 134*  < > 134* 133* 129*  K 4.0  < > 5.0  < > 4.7 4.2 4.7  CL 107  < > 102  < > 104 103 100*  CO2 18*  < > 22  < > 14* 21* 17*  GLUCOSE 352*  < > 90  < > 337* 194* 218*  BUN 23*  < > 43*  < > 41* 39* 39*  CREATININE 1.38*  < > 2.31*  < > 1.49* 1.14* 0.99  CALCIUM 8.1*  < > 8.8*  < > 8.4* 8.4* 8.2*  MG  --   --  2.0  --   --   --   --   PHOS 1.8*  --  4.4  --   --   --   --   < > = values in this interval not displayed.  Recent Labs  10/01/16 0450 11/27/16 1945 11/28/16 0243  AST 360* 36 42*  ALT 304* 15 19  ALKPHOS 91 62 59  BILITOT 0.6 1.1 1.6*  PROT 5.0* 6.1* 5.7*  ALBUMIN 2.6* 3.6 3.4*    Recent Labs  09/28/16 1551  10/16/16 0515 11/27/16 1945  11/29/16 0532 11/30/16 0254 12/01/16 0339  WBC 15.6*  < > 7.4 13.2*  < > 16.8* 11.6* 5.9  NEUTROABS 13.4*  --  4.9 11.5*  --   --   --   --   HGB 9.4*  < > 10.8* 10.8*  < > 10.0* 9.8* 10.2*  HCT 27.4*  < > 31.4* 32.3*  < > 30.3* 29.8* 30.2*  MCV 92.3  < > 93.1 95.3  < > 95.3 93.4 94.7  PLT 354  < > 255 244  < > 228 193 143*  < > = values in this interval not  displayed. Lab Results  Component Value Date   TSH 3.413 09/29/2016   Lab Results  Component Value Date   HGBA1C 6.0 (H) 11/28/2016   Lab Results  Component Value Date   CHOL 224 (H) 10/16/2016   HDL 62 10/16/2016   LDLCALC 140 (H) 10/16/2016   TRIG 108 10/16/2016   CHOLHDL 3.6 10/16/2016    Assessment/Plan Type 2 DM CBG's in the 500's. Will resume home Humulin N and continue on Humalog per SSI. Start Humulin N 18 units in the morning and 6 units SQ at bedtime. Continue to monitor.   Constipation  Continue on Lactulose daily as needed. Add colace 100 mg Capsule one by mouth daily.    Family/ staff Communication: reviewed plan of care with patient, patient's daughter and facility Nurse supervisor.   Labs/tests ordered: None

## 2016-12-05 NOTE — Progress Notes (Signed)
This encounter was created in error - please disregard.

## 2016-12-08 DIAGNOSIS — R300 Dysuria: Secondary | ICD-10-CM | POA: Diagnosis not present

## 2016-12-08 LAB — BASIC METABOLIC PANEL
BUN: 36 mg/dL — AB (ref 4–21)
CREATININE: 1.1 mg/dL (ref 0.5–1.1)
Glucose: 144 mg/dL
POTASSIUM: 4.5 mmol/L (ref 3.4–5.3)
SODIUM: 142 mmol/L (ref 137–147)

## 2016-12-08 LAB — CBC AND DIFFERENTIAL
HCT: 48 % — AB (ref 36–46)
Hemoglobin: 15.9 g/dL (ref 12.0–16.0)
Platelets: 197 10*3/uL (ref 150–399)
WBC: 5.6 10^3/mL

## 2016-12-11 ENCOUNTER — Encounter: Payer: Self-pay | Admitting: Family

## 2016-12-11 ENCOUNTER — Non-Acute Institutional Stay (SKILLED_NURSING_FACILITY): Payer: Medicare Other | Admitting: Family

## 2016-12-11 DIAGNOSIS — B372 Candidiasis of skin and nail: Secondary | ICD-10-CM

## 2016-12-11 DIAGNOSIS — R05 Cough: Secondary | ICD-10-CM

## 2016-12-11 DIAGNOSIS — B379 Candidiasis, unspecified: Secondary | ICD-10-CM

## 2016-12-11 DIAGNOSIS — N3001 Acute cystitis with hematuria: Secondary | ICD-10-CM | POA: Diagnosis not present

## 2016-12-11 DIAGNOSIS — R059 Cough, unspecified: Secondary | ICD-10-CM

## 2016-12-11 MED ORDER — NYSTATIN-TRIAMCINOLONE 100000-0.1 UNIT/GM-% EX OINT: 1.0000 "application " | TOPICAL_OINTMENT | Freq: Four times a day (QID) | CUTANEOUS | 0 refills | Status: DC

## 2016-12-11 MED ORDER — SACCHAROMYCES BOULARDII 250 MG PO CAPS: 250.0000 mg | ORAL_CAPSULE | Freq: Two times a day (BID) | ORAL | 0 refills | Status: AC

## 2016-12-11 MED ORDER — FLUCONAZOLE 150 MG PO TABS: 150.0000 mg | ORAL_TABLET | Freq: Once | ORAL | 0 refills | Status: AC

## 2016-12-11 MED ORDER — CIPROFLOXACIN HCL 500 MG PO TABS: 500.0000 mg | ORAL_TABLET | Freq: Two times a day (BID) | ORAL | 0 refills | Status: AC

## 2016-12-11 NOTE — Addendum Note (Signed)
Addended byMarlowe Sax C on: 12/11/2016 01:29 PM   Modules accepted: Orders, Level of Service

## 2016-12-11 NOTE — Progress Notes (Addendum)
Location:  [facility] Room Number: 1203-P Place of Service:  SNF (31) Provider:  Marlowe Sax, FNP-C   [name], MD  Patient Care Team: Susy Frizzle, MD as PCP - General (Family Medicine)  Extended Emergency Contact Information Primary Emergency Contact: [name] Phone: [phone] Mobile Phone: [phone] Relation: Daughter Secondary Emergency Contact: Nelson,Debbie Address: [address] [place], [place] [address] Johnnette Litter of [place] Phone: [phone] Work Phone: [phone] Mobile Phone: [phone] Relation: Daughter  Code Status:  DNR Goals of care: Advanced Directive information Advanced Directives 12/05/2016  Does Patient Have a Medical Advance Directive? Yes  Type of Advance Directive Out of facility DNR (pink MOST or yellow form)  Does patient want to make changes to medical advance directive? -  Copy of [place] in Chart? -  Would patient like information on creating a medical advance directive? -  Pre-existing out of facility DNR order (yellow form or pink MOST form) Yellow form placed in chart (order not valid for inpatient use)     Chief Complaint  Patient presents with  . Acute Visit    Non-productive cough    HPI:  Pt is a 83 y.o. female seen today at [facility] for an acute visit for evaluation of cough and congestion.She has a significant medical history of HTN, Type 2 DM,Hypothyroidism, CAD among other conditions.She is seen in her room today with daughter Juliann Pulse at bedside.Patient's daughter states patient has has had a non-productive. She denies any fever,chill or shortness of breath. Patient recent urine analysis done 12/09/2016 showed cloudy urine with trace blood, negative Nitrites and moderate leukocytes. Urine culture showed 50,000-100,000 colonies of pseudomonas aeruginosa and candida albicans.    Past  Medical History:  Diagnosis Date  . Arthritis   . Asthma   . COPD (chronic obstructive pulmonary disease) ([place])   . Coronary atherosclerosis   . Dyslipidemia   . Gallstones   . History of fractured kneecap   . Hypothyroidism   . Interstitial cystitis   . Malaise and fatigue   . Memory loss   . Polymyalgia rheumatica ([place])   . Systemic hypertension   . Type II or unspecified type diabetes mellitus without mention of complication, not stated as uncontrolled   . UTI (lower urinary tract infection)    Past Surgical History:  Procedure Laterality Date  . CARDIAC CATHETERIZATION  01/29/2006   10% luminal irregularity mid LAD  . CARDIAC CATHETERIZATION  03/15/2001   No significant CAD, no evidence of renal artery stenosis, systemic hypertenion  . CARDIAC CATHETERIZATION N/A 08/21/2016   Procedure: Temporary Pacemaker;  Surgeon: Jettie Booze, MD;  Location: [facility];  Service: Cardiovascular;  Laterality: N/A;  . CARDIAC CATHETERIZATION N/A 09/28/2016   Procedure: Temporary Pacemaker;  Surgeon: Burnell Blanks, MD;  Location: [facility];  Service: Cardiovascular;  Laterality: N/A;  . CERVICAL SPINE SURGERY    . CHOLECYSTECTOMY    . EP IMPLANTABLE DEVICE N/A 09/29/2016   Procedure: Pacemaker Implant;  Surgeon: Evans Lance, MD;  Location: [facility];  Service: Cardiovascular;  Laterality: N/A;  . HERNIA REPAIR    . LUMBAR DISC SURGERY    . NM MYOCAR PERF WALL MOTION  08/17/2009   normal  . ROTATOR CUFF REPAIR    . TONSILLECTOMY    . TOTAL ABDOMINAL HYSTERECTOMY    .  US ECHOCARDIOGRAPHY  09/14/2007   mild LVH, LA mildly dilated,mild mitral annular calcification, AOV mildly sclerotic, aortic root sclerosis/ca+    No Known Allergies  Allergies as of 12/11/2016   No Known Allergies     Medication List       Accurate as of 12/11/16 12:12 PM. Always use your most recent med list.          acetaminophen 325 MG tablet Commonly known as:   TYLENOL Take 650 mg by mouth. Take 2 tabs every 6 hours as needed for mild pain   albuterol 108 (90 Base) MCG/ACT inhaler Commonly known as:  PROVENTIL HFA;VENTOLIN HFA Inhale 2 puffs into the lungs every 4 (four) hours as needed for wheezing or shortness of breath.   amLODipine 2.5 MG tablet Commonly known as:  NORVASC Take 1 tablet (2.5 mg total) by mouth daily.   aspirin 81 MG tablet Take 81 mg by mouth daily.   carvedilol 6.25 MG tablet Commonly known as:  COREG Take 6.25 mg by mouth 2 (two) times daily with a meal.   diphenhydramine-acetaminophen 25-500 MG Tabs tablet Commonly known as:  TYLENOL PM Take 2 tablets by mouth at bedtime.   docusate sodium 100 MG capsule Commonly known as:  COLACE Take 100 mg by mouth daily.   fentaNYL 25 MCG/HR patch Commonly known as:  DURAGESIC - dosed mcg/hr Place 1 patch (25 mcg total) onto the skin every 3 (three) days.   glucagon 1 MG injection Inject 1 mg into the vein once as needed.   insulin aspart 100 UNIT/ML injection Commonly known as:  novoLOG Inject 12 Units into the skin as needed for high blood sugar. May give additional 12 units if blood sugar over 500, then recheck in 2 hours   insulin lispro 100 UNIT/ML injection Commonly known as:  HUMALOG Inject 0-0.09 mLs (0-9 Units total) into the skin 3 (three) times daily with meals. Sliding scale CBG 70 - 120: 0 units CBG 121 - 150: 1 unit,  CBG 151 - 200: 2 units,  CBG 201 - 250: 3 units,  CBG 251 - 300: 5 units,  CBG 301 - 350: 7 units,  CBG 351 - 400: 9 units   CBG > 400: 9 units and notify your MD   insulin NPH-regular Human (70-30) 100 UNIT/ML injection Commonly known as:  NOVOLIN 70/30 Inject 6 Units into the skin at bedtime. Give 18 units in the morning   Insulin Syringe-Needle U-100 31G X 5/16" 1 ML Misc For injection of insulin QID   ipratropium-albuterol 0.5-2.5 (3) MG/3ML Soln Commonly known as:  DUONEB Take 3 mLs by nebulization 3 (three) times daily.     lactulose 10 GM/15ML solution Commonly known as:  CHRONULAC Take 45 mLs (30 g total) by mouth daily as needed for mild constipation.   levothyroxine 100 MCG tablet Commonly known as:  SYNTHROID, LEVOTHROID TAKE 1 TABLET (100 MCG TOTAL) BY MOUTH DAILY.   LORazepam 1 MG tablet Commonly known as:  ATIVAN Take 1 tablet (1 mg total) by mouth every 8 (eight) hours as needed for anxiety.   meclizine 12.5 MG tablet Commonly known as:  ANTIVERT Take 1 tablet (12.5 mg total) by mouth daily as needed for dizziness (vertigo).   MUCINEX 600 MG 12 hr tablet Generic drug:  guaiFENesin Take 600 mg by mouth 2 (two) times daily.   nystatin 100000 UNIT/ML suspension Commonly known as:  MYCOSTATIN Take 5 mLs by mouth 4 (four) times daily. Stop date 12/14/16  ONE TOUCH ULTRA TEST test strip Generic drug:  glucose blood TEST BLOOD SUGAR 5-6 TIMES A DAY E11.9   OXYGEN Inhale 2 L into the lungs continuous.   polyethylene glycol packet Commonly known as:  MIRALAX / GLYCOLAX Take 17 g by mouth every morning.   predniSONE 5 MG tablet Commonly known as:  DELTASONE Take 1 tablet (5 mg total) by mouth daily. Please continue after prednisone taper is completed   psyllium 95 % Pack Commonly known as:  HYDROCIL/METAMUCIL Take 1 packet by mouth 3 (three) times daily.   telmisartan 40 MG tablet Commonly known as:  MICARDIS Take 40 mg by mouth daily as needed (blood pressure over 180).   tiotropium 18 MCG inhalation capsule Commonly known as:  SPIRIVA HANDIHALER Place 1 capsule (18 mcg total) into inhaler and inhale daily.   traMADol 50 MG tablet Commonly known as:  ULTRAM Take 1 tablet (50 mg total) by mouth daily as needed for moderate pain.       Review of Systems  Constitutional: Positive for fatigue. Negative for activity change, appetite change, chills and fever.  HENT: Negative for congestion, rhinorrhea, sinus pain, sinus pressure, sneezing and sore throat.   Eyes: Negative.    Respiratory: Positive for cough. Negative for chest tightness, shortness of breath and wheezing.   Cardiovascular: Negative for chest pain, palpitations and leg swelling.  Gastrointestinal: Negative for abdominal distention, abdominal pain, constipation, diarrhea, nausea and vomiting.  Genitourinary: Negative for dysuria, flank pain, frequency and urgency.  Musculoskeletal: Positive for gait problem.  Skin: Negative for color change, pallor and rash.  Neurological: Negative for dizziness, seizures, syncope and headaches.  Psychiatric/Behavioral: Negative for agitation, confusion, hallucinations and sleep disturbance. The patient is not nervous/anxious.     Immunization History  Administered Date(s) Administered  . Influenza Split 08/22/2011, 08/10/2012, 08/10/2013, 08/10/2014  . Influenza Whole 08/10/2010  . Influenza,inj,Quad PF,36+ Mos 07/24/2016  . PPD Test 12/03/2016  . Pneumococcal Polysaccharide-23 02/21/2010   Pertinent  Health Maintenance Due  Topic Date Due  . FOOT EXAM  10/12/1942  . OPHTHALMOLOGY EXAM  10/12/1942  . DEXA SCAN  10/13/1999  . PNA vac Low Risk Adult (2 of 2 - PCV13) 02/22/2011  . HEMOGLOBIN A1C  05/28/2017  . INFLUENZA VACCINE  Completed   Fall Risk  09/05/2016  Falls in the past year? No    Vitals:   12/11/16 1007  BP: (!) 159/67  Pulse: 61  Resp: 20  Temp: 98 F (36.7 C)  TempSrc: Oral  SpO2: 98%  Weight: 135 lb (61.2 kg)  Height: 5\' 1"  (1.549 m)   Body mass index is 25.51 kg/m. Physical Exam  Constitutional: She appears well-developed and well-nourished. No distress.  HENT:  Head: Normocephalic.  Mouth/Throat: Oropharynx is clear and moist. No oropharyngeal exudate.  Eyes: Conjunctivae and EOM are normal. Pupils are equal, round, and reactive to light. Right eye exhibits no discharge. Left eye exhibits no discharge. No scleral icterus.  Neck: Normal range of motion. No JVD present. No thyromegaly present.  Cardiovascular: Normal rate,  regular rhythm and intact distal pulses.  Exam reveals no gallop and no friction rub.   No murmur heard. Pulmonary/Chest: Effort normal. No respiratory distress. She has no wheezes. She has no rales.  diminished bases to auscultation.   Abdominal: Soft. Bowel sounds are normal. She exhibits no distension. There is no tenderness. There is no rebound and no guarding.  Musculoskeletal: She exhibits no edema, tenderness or deformity.  Unsteady gait  Lymphadenopathy:    She has no cervical adenopathy.  Neurological: She is alert.  Skin: Skin is warm and dry. No rash noted. No erythema. No pallor.  Psychiatric: She has a normal mood and affect.    Labs reviewed:  Recent Labs  03/12/16 1147  09/29/16 0211  11/29/16 0532 11/30/16 0254 12/01/16 0339 12/08/16  NA 135  < > 134*  < > 134* 133* 129* 142  K 4.0  < > 5.0  < > 4.7 4.2 4.7 4.5  CL 107  < > 102  < > 104 103 100*  --   CO2 18*  < > 22  < > 14* 21* 17*  --   GLUCOSE 352*  < > 90  < > 337* 194* 218*  --   BUN 23*  < > 43*  < > 41* 39* 39* 36*  CREATININE 1.38*  < > 2.31*  < > 1.49* 1.14* 0.99 1.1  CALCIUM 8.1*  < > 8.8*  < > 8.4* 8.4* 8.2*  --   MG  --   --  2.0  --   --   --   --   --   PHOS 1.8*  --  4.4  --   --   --   --   --   < > = values in this interval not displayed.  Recent Labs  10/01/16 0450 11/27/16 1945 11/28/16 0243  AST 360* 36 42*  ALT 304* 15 19  ALKPHOS 91 62 59  BILITOT 0.6 1.1 1.6*  PROT 5.0* 6.1* 5.7*  ALBUMIN 2.6* 3.6 3.4*    Recent Labs  09/28/16 1551  10/16/16 0515 11/27/16 1945  11/29/16 0532 11/30/16 0254 12/01/16 0339 12/08/16  WBC 15.6*  < > 7.4 13.2*  < > 16.8* 11.6* 5.9 5.6  NEUTROABS 13.4*  --  4.9 11.5*  --   --   --   --   --   HGB 9.4*  < > 10.8* 10.8*  < > 10.0* 9.8* 10.2* 15.9  HCT 27.4*  < > 31.4* 32.3*  < > 30.3* 29.8* 30.2* 48*  MCV 92.3  < > 93.1 95.3  < > 95.3 93.4 94.7  --   PLT 354  < > 255 244  < > 228 193 143* 197  < > = values in this interval not  displayed. Lab Results  Component Value Date   TSH 3.413 09/29/2016   Lab Results  Component Value Date   HGBA1C 6.0 (H) 11/28/2016   Lab Results  Component Value Date   CHOL 224 (H) 10/16/2016   HDL 62 10/16/2016   LDLCALC 140 (H) 10/16/2016   TRIG 108 10/16/2016   CHOLHDL 3.6 10/16/2016    Assessment/Plan Cough  Afebrile.Non-productive. Continue on Duoneb and Robitussin. Obtain portable CXR Pa/Lat rule out pneumonia. Monitor vital signs every shift x 5 days then resume previous.   Candida Gluteal skin angry red rash. Start Nystantin-Triamcinolone 0.1 %cream apply to affected areas on gluteus twice daily until resolved.   candida albicans. Positive in the urine.Start Diflucan 150 mg Tablet one by mouth x 1 dose then repeat 150 mg Tablet by mouth in one week.   Urinary Tract infection urine analysis showed cloudy urine with trace blood, negative Nitrites and moderate leukocytes. Urine culture showed 50,000-100,000 colonies of pseudomonas aeruginosa and candida albicans.Start cipro 500 mg Tablet twice daily by mouth x 7 days. Florastor 250 mg Tablet twice daily X 10 days for prophylaxis.  Family/ staff Communication: reviewed plan of care with patient, patient's daughter, facility Nurse and  Nurse supervisor.   Labs/tests ordered: portable CXR Pa/Lat rule out pneumonia.

## 2016-12-12 DIAGNOSIS — R627 Adult failure to thrive: Secondary | ICD-10-CM | POA: Diagnosis not present

## 2016-12-17 ENCOUNTER — Encounter: Payer: Medicare Other | Admitting: Internal Medicine

## 2016-12-17 ENCOUNTER — Ambulatory Visit: Payer: Medicare Other | Admitting: Neurology

## 2016-12-19 DIAGNOSIS — R531 Weakness: Secondary | ICD-10-CM | POA: Diagnosis not present

## 2016-12-23 ENCOUNTER — Encounter: Payer: Self-pay | Admitting: Family

## 2016-12-23 ENCOUNTER — Non-Acute Institutional Stay (SKILLED_NURSING_FACILITY): Payer: Medicare Other | Admitting: Family

## 2016-12-23 DIAGNOSIS — F0280 Dementia in other diseases classified elsewhere without behavioral disturbance: Secondary | ICD-10-CM

## 2016-12-23 DIAGNOSIS — E039 Hypothyroidism, unspecified: Secondary | ICD-10-CM

## 2016-12-23 DIAGNOSIS — G309 Alzheimer's disease, unspecified: Secondary | ICD-10-CM | POA: Diagnosis not present

## 2016-12-23 DIAGNOSIS — J449 Chronic obstructive pulmonary disease, unspecified: Secondary | ICD-10-CM | POA: Diagnosis not present

## 2016-12-23 DIAGNOSIS — L89312 Pressure ulcer of right buttock, stage 2: Secondary | ICD-10-CM | POA: Diagnosis not present

## 2016-12-23 DIAGNOSIS — I2510 Atherosclerotic heart disease of native coronary artery without angina pectoris: Secondary | ICD-10-CM

## 2016-12-23 DIAGNOSIS — E1121 Type 2 diabetes mellitus with diabetic nephropathy: Secondary | ICD-10-CM

## 2016-12-23 DIAGNOSIS — M353 Polymyalgia rheumatica: Secondary | ICD-10-CM

## 2016-12-23 DIAGNOSIS — I10 Essential (primary) hypertension: Secondary | ICD-10-CM | POA: Diagnosis not present

## 2016-12-23 DIAGNOSIS — Z794 Long term (current) use of insulin: Secondary | ICD-10-CM

## 2016-12-23 NOTE — Progress Notes (Signed)
Location:  [facility] Room Number: 1203-P Place of Service:  SNF (31)  Provider: Marlowe Sax FNP-C  PCP: [name], MD Patient Care Team: Susy Frizzle, MD as PCP - General (Family Medicine)  Extended Emergency Contact Information Primary Emergency Contact: [name] Phone: [phone] Mobile Phone: [phone] Relation: Daughter Secondary Emergency Contact: Nelson,Debbie Address: [address] [place], [place] [address] [place] Phone: [phone] Work Phone: [phone] Mobile Phone: [phone] Relation: Daughter  Code Status: DNR/MOST FORM  Goals of care:  Advanced Directive information Advanced Directives 12/05/2016  Does Patient Have a Medical Advance Directive? Yes  Type of Advance Directive Out of facility DNR (pink MOST or yellow form)  Does patient want to make changes to medical advance directive? -  Copy of [place] in Chart? -  Would patient like information on creating a medical advance directive? -  Pre-existing out of facility DNR order (yellow form or pink MOST form) Yellow form placed in chart (order not valid for inpatient use)     No Known Allergies  Chief Complaint  Patient presents with  . Discharge Note    Discharge Visit     HPI:  83 y.o. female  Seen at [facility] for discharge home with hospice.She was here for short term rehabilitation post hospital admission from 11/27/2016-12/02/2016 with acute respiratory failure from aspiration pneumonia and COPD exacerbation. She was treated with antibiotic, steroids and breathing treatments. She was seen by speech therapist for dysphagia and underwent FEES study. She has a medical history of HTN, CAD, Type 2 DM, COPD,Hypothyroidism,PMR among other conditions.She is seen in her room today with two daughters at bedside. Patient's daughter states patient  has had decreased oral intake and generalized weakness for the past two days.Facility Nurse states patient unable to eat meals or swallow medications.Patient denies any acute issues though falls asleep during visit unable to provide HPI and ROS. Patient family request discharge home with Hospice service.During her stay here in rehab she worked with therapy had limited progress due to participation limitation. She was treated with antibiotics for UTI and antifungal for positive candida in the urine. She has right gluteal ulcer managed by wound care Nurse. Hospice service will arrange for DME. Patient's daughter states awaiting for hospice to deliver a hospital bed to patient's house then patient will be pick up by EMS. Social worker notified to arrange with Hospice for a pressure ulcer relieving air mattress to promote right gluteal pressure ulcer healing.     Past Medical History:  Diagnosis Date  . Arthritis   . Asthma   . COPD (chronic obstructive pulmonary disease) ([place])   . Coronary atherosclerosis   . Dyslipidemia   . Gallstones   . History of fractured kneecap   . Hypothyroidism   . Interstitial cystitis   . Malaise and fatigue   . Memory loss   . Polymyalgia rheumatica ([place])   . Systemic hypertension   . Type II or unspecified type diabetes mellitus without mention of complication, not stated as uncontrolled   . UTI (lower urinary tract infection)     Past Surgical History:  Procedure Laterality Date  . CARDIAC CATHETERIZATION  01/29/2006   10% luminal irregularity mid LAD  . CARDIAC CATHETERIZATION  03/15/2001   No significant CAD, no evidence of renal artery stenosis, systemic hypertenion  . CARDIAC CATHETERIZATION  N/A 08/21/2016   Procedure: Temporary Pacemaker;  Surgeon: Jettie Booze, MD;  Location: [facility];  Service: Cardiovascular;  Laterality: N/A;  . CARDIAC CATHETERIZATION N/A 09/28/2016   Procedure: Temporary Pacemaker;  Surgeon: Burnell Blanks,  MD;  Location: [facility];  Service: Cardiovascular;  Laterality: N/A;  . CERVICAL SPINE SURGERY    . CHOLECYSTECTOMY    . EP IMPLANTABLE DEVICE N/A 09/29/2016   Procedure: Pacemaker Implant;  Surgeon: Evans Lance, MD;  Location: [facility];  Service: Cardiovascular;  Laterality: N/A;  . HERNIA REPAIR    . LUMBAR DISC SURGERY    . NM MYOCAR PERF WALL MOTION  08/17/2009   normal  . ROTATOR CUFF REPAIR    . TONSILLECTOMY    . TOTAL ABDOMINAL HYSTERECTOMY    . US ECHOCARDIOGRAPHY  09/14/2007   mild LVH, LA mildly dilated,mild mitral annular calcification, AOV mildly sclerotic, aortic root sclerosis/ca+      reports that she has never smoked. She has never used smokeless tobacco. She reports that she does not drink alcohol or use drugs. Social History   Social History  . Marital status: Widowed    Spouse name: N/A  . Number of children: 4  . Years of education: 12   Occupational History  . retired Retired   Social History Main Topics  . Smoking status: Never Smoker  . Smokeless tobacco: Never Used  . Alcohol use No  . Drug use: No  . Sexual activity: No   Other Topics Concern  . Not on file   Social History Narrative   Patient lives at home alone. Widowed.   Retired.   Education High school   Right handed   Caffeine- some times coffee and soda one cup.   Functional Status Survey:    No Known Allergies  Pertinent  Health Maintenance Due  Topic Date Due  . FOOT EXAM  10/12/1942  . OPHTHALMOLOGY EXAM  10/12/1942  . DEXA SCAN  10/13/1999  . PNA vac Low Risk Adult (2 of 2 - PCV13) 02/22/2011  . HEMOGLOBIN A1C  05/28/2017  . INFLUENZA VACCINE  Completed    Medications: Allergies as of 12/23/2016   No Known Allergies     Medication List       Accurate as of 12/23/16  2:25 PM. Always use your most recent med list.          acetaminophen 500 MG tablet Commonly known as:  TYLENOL Take 500 mg by mouth 2 (two) times daily. Take one in the  morning and the other at 1pm   albuterol 108 (90 Base) MCG/ACT inhaler Commonly known as:  PROVENTIL HFA;VENTOLIN HFA Inhale 2 puffs into the lungs every 4 (four) hours as needed for wheezing or shortness of breath.   amLODipine 2.5 MG tablet Commonly known as:  NORVASC Take 1 tablet (2.5 mg total) by mouth daily.   aspirin 81 MG tablet Take 81 mg by mouth daily.   carvedilol 6.25 MG tablet Commonly known as:  COREG Take 6.25 mg by mouth 2 (two) times daily with a meal.   diphenhydramine-acetaminophen 25-500 MG Tabs tablet Commonly known as:  TYLENOL PM Take 2 tablets by mouth at bedtime.   docusate sodium 100 MG capsule Commonly known as:  COLACE Take 100 mg by mouth daily.   doxycycline 100 MG EC tablet Commonly known as:  DORYX Take 100 mg by mouth 2 (two) times daily. Stop date 12/23/16   fentaNYL  25 MCG/HR patch Commonly known as:  [place] - dosed mcg/hr Place 1 patch (25 mcg total) onto the skin every 3 (three) days.   glucagon 1 MG injection Inject 1 mg into the vein once as needed.   insulin lispro 100 UNIT/ML injection Commonly known as:  HUMALOG Inject 0-0.09 mLs (0-9 Units total) into the skin 3 (three) times daily with meals. Sliding scale CBG 70 - 120: 0 units CBG 121 - 150: 1 unit,  CBG 151 - 200: 2 units,  CBG 201 - 250: 3 units,  CBG 251 - 300: 5 units,  CBG 301 - 350: 7 units,  CBG 351 - 400: 9 units   CBG > 400: 9 units and notify your MD   insulin NPH-regular Human (70-30) 100 UNIT/ML injection Commonly known as:  NOVOLIN 70/30 Inject 6 Units into the skin at bedtime. Give 18 units in the morning   insulin NPH-regular Human (70-30) 100 UNIT/ML injection Commonly known as:  NOVOLIN 70/30 Inject 18 Units into the skin every morning.   Insulin Syringe-Needle U-100 31G X 5/16" 1 ML Misc For injection of insulin QID   ipratropium-albuterol 0.5-2.5 (3) MG/3ML Soln Commonly known as:  DUONEB Take 3 mLs by nebulization 3 (three) times daily.     lactulose 10 GM/15ML solution Commonly known as:  CHRONULAC Take 45 mLs (30 g total) by mouth daily as needed for mild constipation.   levothyroxine 100 MCG tablet Commonly known as:  SYNTHROID, LEVOTHROID TAKE 1 TABLET (100 MCG TOTAL) BY MOUTH DAILY.   LORazepam 1 MG tablet Commonly known as:  ATIVAN Take 1 tablet (1 mg total) by mouth every 8 (eight) hours as needed for anxiety.   meclizine 12.5 MG tablet Commonly known as:  ANTIVERT Take 12.5 mg by mouth daily.   MUCINEX 600 MG 12 hr tablet Generic drug:  guaiFENesin Take 600 mg by mouth 2 (two) times daily.   nystatin cream Commonly known as:  MYCOSTATIN Apply 1 application topically 2 (two) times daily.   nystatin-triamcinolone cream Commonly known as:  MYCOLOG II Apply 1 application topically every 6 (six) hours.   ONE TOUCH ULTRA TEST test strip Generic drug:  glucose blood TEST BLOOD SUGAR 5-6 TIMES A DAY E11.9   OXYGEN Inhale 2 L into the lungs continuous.   polyethylene glycol packet Commonly known as:  MIRALAX / GLYCOLAX Take 17 g by mouth every morning.   predniSONE 5 MG tablet Commonly known as:  DELTASONE Take 1 tablet (5 mg total) by mouth daily. Please continue after prednisone taper is completed   psyllium 95 % Pack Commonly known as:  HYDROCIL/METAMUCIL Take 1 packet by mouth 3 (three) times daily.   telmisartan 40 MG tablet Commonly known as:  MICARDIS Take 40 mg by mouth daily as needed (blood pressure over 180).   tiotropium 18 MCG inhalation capsule Commonly known as:  SPIRIVA HANDIHALER Place 1 capsule (18 mcg total) into inhaler and inhale daily.   UNABLE TO FIND Med Name: Med pass 90 mL by mouth 2 times daily. Stop date 01/16/17       Review of Systems  Constitutional: Positive for fatigue. Negative for activity change, appetite change, chills and fever.  HENT: Negative for congestion, rhinorrhea, sinus pain, sinus pressure, sneezing and sore throat.   Eyes: Negative.    Respiratory: Negative for cough, chest tightness, shortness of breath and wheezing.   Cardiovascular: Negative for chest pain, palpitations and leg swelling.  Gastrointestinal: Negative for abdominal distention, abdominal pain,  constipation, diarrhea, nausea and vomiting.  Endocrine: Negative for cold intolerance, heat intolerance, polydipsia, polyphagia and polyuria.  Genitourinary: Negative for dysuria, flank pain, frequency and urgency.  Musculoskeletal: Positive for gait problem.  Skin: Negative for color change, pallor and rash.       Right gluteal ulcer  Neurological: Negative for dizziness, seizures, syncope and headaches.  Hematological: Does not bruise/bleed easily.  Psychiatric/Behavioral: Negative for agitation, confusion, hallucinations and sleep disturbance. The patient is not nervous/anxious.     Vitals:   12/23/16 1117  BP: 111/78  Pulse: 66  Resp: 18  Temp: 97.5 F (36.4 C)  TempSrc: Oral  SpO2: 96%  Weight: 127 lb 9.6 oz (57.9 kg)  Height: 5\' 1"  (1.549 m)   Body mass index is 24.11 kg/m. Physical Exam  Constitutional: She appears well-developed and well-nourished. No distress.  Generalized weakness   HENT:  Head: Normocephalic.  Mouth/Throat: Oropharynx is clear and moist. No oropharyngeal exudate.  Eyes: Conjunctivae and EOM are normal. Pupils are equal, round, and reactive to light. Right eye exhibits no discharge. Left eye exhibits no discharge. No scleral icterus.  Neck: Normal range of motion. No JVD present. No thyromegaly present.  Cardiovascular: Normal rate, regular rhythm and intact distal pulses.  Exam reveals no gallop and no friction rub.   No murmur heard. Pulmonary/Chest: Effort normal and breath sounds normal. No respiratory distress. She has no wheezes. She has no rales.  Abdominal: Soft. Bowel sounds are normal. She exhibits no distension. There is no tenderness. There is no rebound and no guarding.  Genitourinary:  Genitourinary Comments:  Incontinent   Musculoskeletal: She exhibits no edema, tenderness or deformity.  Unsteady gait. Generalized weakness   Lymphadenopathy:    She has no cervical adenopathy.  Neurological: She is alert.  Skin: Skin is warm and dry. No rash noted. No erythema. No pallor.  Psychiatric: She has a normal mood and affect.    Labs reviewed: Basic Metabolic Panel:  Recent Labs  03/12/16 1147  09/29/16 0211  11/29/16 0532 11/30/16 0254 12/01/16 0339 12/08/16  NA 135  < > 134*  < > 134* 133* 129* 142  K 4.0  < > 5.0  < > 4.7 4.2 4.7 4.5  CL 107  < > 102  < > 104 103 100*  --   CO2 18*  < > 22  < > 14* 21* 17*  --   GLUCOSE 352*  < > 90  < > 337* 194* 218*  --   BUN 23*  < > 43*  < > 41* 39* 39* 36*  CREATININE 1.38*  < > 2.31*  < > 1.49* 1.14* 0.99 1.1  CALCIUM 8.1*  < > 8.8*  < > 8.4* 8.4* 8.2*  --   MG  --   --  2.0  --   --   --   --   --   PHOS 1.8*  --  4.4  --   --   --   --   --   < > = values in this interval not displayed. Liver Function Tests:  Recent Labs  10/01/16 0450 11/27/16 1945 11/28/16 0243  AST 360* 36 42*  ALT 304* 15 19  ALKPHOS 91 62 59  BILITOT 0.6 1.1 1.6*  PROT 5.0* 6.1* 5.7*  ALBUMIN 2.6* 3.6 3.4*    Recent Labs  09/28/16 1551 10/01/16 1241 11/27/16 1945  LIPASE 28 30 11     Recent Labs  08/24/16 1129  AMMONIA 16  CBC:  Recent Labs  09/28/16 1551  10/16/16 0515 11/27/16 1945  11/29/16 0532 11/30/16 0254 12/01/16 0339 12/08/16  WBC 15.6*  < > 7.4 13.2*  < > 16.8* 11.6* 5.9 5.6  NEUTROABS 13.4*  --  4.9 11.5*  --   --   --   --   --   HGB 9.4*  < > 10.8* 10.8*  < > 10.0* 9.8* 10.2* 15.9  HCT 27.4*  < > 31.4* 32.3*  < > 30.3* 29.8* 30.2* 48*  MCV 92.3  < > 93.1 95.3  < > 95.3 93.4 94.7  --   PLT 354  < > 255 244  < > 228 193 143* 197  < > = values in this interval not displayed. Cardiac Enzymes:  Recent Labs  08/21/16 0214 08/21/16 0603 08/21/16 1357  TROPONINI 0.15* 0.17* 0.20*     Recent Labs  12/01/16 2121  12/02/16 0818 12/02/16 1213  GLUCAP 213* 145* 185*    Assessment/Plan:   1. Essential hypertension B/p stable. Continue on Amlodipine, carvedilol and telmisartan.   2. Coronary artery disease  Chest pain free. Continue on ASA.   3. Asthma with COPD Breathing stable.continue on Albuterol,spiriva and Duoneb.    4. Type 2 diabetes mellitus with diabetic nephropathy Continue on NPH 18 units in the morning; 6 units at bedtime and Humalog per SSI.   5. Hypothyroidism Continue on levothyroxine 100 mcg Tablet. Monitor TSH level.   6. Alzheimer's dementia without behavioral disturbance Progressive decline with decreased oral intake.Continue to assist with ADL's.Will discharge home with Hospice per family request.    7. Polymyalgia rheumatica ([place]) Continue on Prednisone  8. Decubitus ulcer of right buttock, stage 2 Afebrile. No signs of infections. Hospice Nurse to evaluate and treat as indicated. Continue on Protein supplement as tolerated. Recommended alternating low air mattress and hospital bed. Hospice service will arrange for required DME.     Patient is being discharged with the following home health services:  - Hospice service due to progressive decline in conditions and generalized weakness    Patient is being discharged with the following durable medical equipment:    - Hospice service will arrange for required DME.   Patient has been advised to f/u with their PCP in 1-2 weeks to for a transitions of care visit. Social services at their facility was responsible for arranging this appointment. Pt was provided with adequate prescriptions of noncontrolled medications to reach the scheduled appointment.For controlled substances, a limited supply was provided as appropriate for the individual patient.  If the pt normally receives these medications from a pain clinic or has a contract with another physician, these medications should be received from that clinic or physician only).     Future labs/tests needed:  None discharged home on Hospice.

## 2016-12-29 ENCOUNTER — Telehealth: Payer: Self-pay | Admitting: Family Medicine

## 2016-12-29 NOTE — Telephone Encounter (Signed)
The funeral home is calling to speak with you regarding dr pickard signing this death certificate  Please call them at [phone] [facility]

## 2016-12-30 NOTE — Telephone Encounter (Signed)
Per [facility] will signed death certificate and funeral home aware

## 2017-01-08 DEATH — deceased

## 2017-01-09 ENCOUNTER — Encounter: Payer: Medicare Other | Admitting: Internal Medicine

## 2017-04-03 ENCOUNTER — Telehealth: Payer: Self-pay | Admitting: Family Medicine

## 2017-04-03 NOTE — Telephone Encounter (Signed)
Unfortunately, patient expired on 01-12-2017.  Death Certificate was signed by PCP.  The state has rejected Death Certificate because location of death was not marked.  [employer]HS is calling today to get a statement from physician indicating patient did die in her residence and not elsewhere.  Letter written and returned to the state

## 2018-01-10 IMAGING — CR DG CHEST 1V PORT
1 series · 1 of 1 positions shown · non-contrast
Comparison: 03/10/2016

CLINICAL DATA: Pulmonary edema

EXAM:
PORTABLE CHEST 1 VIEW

[AP]
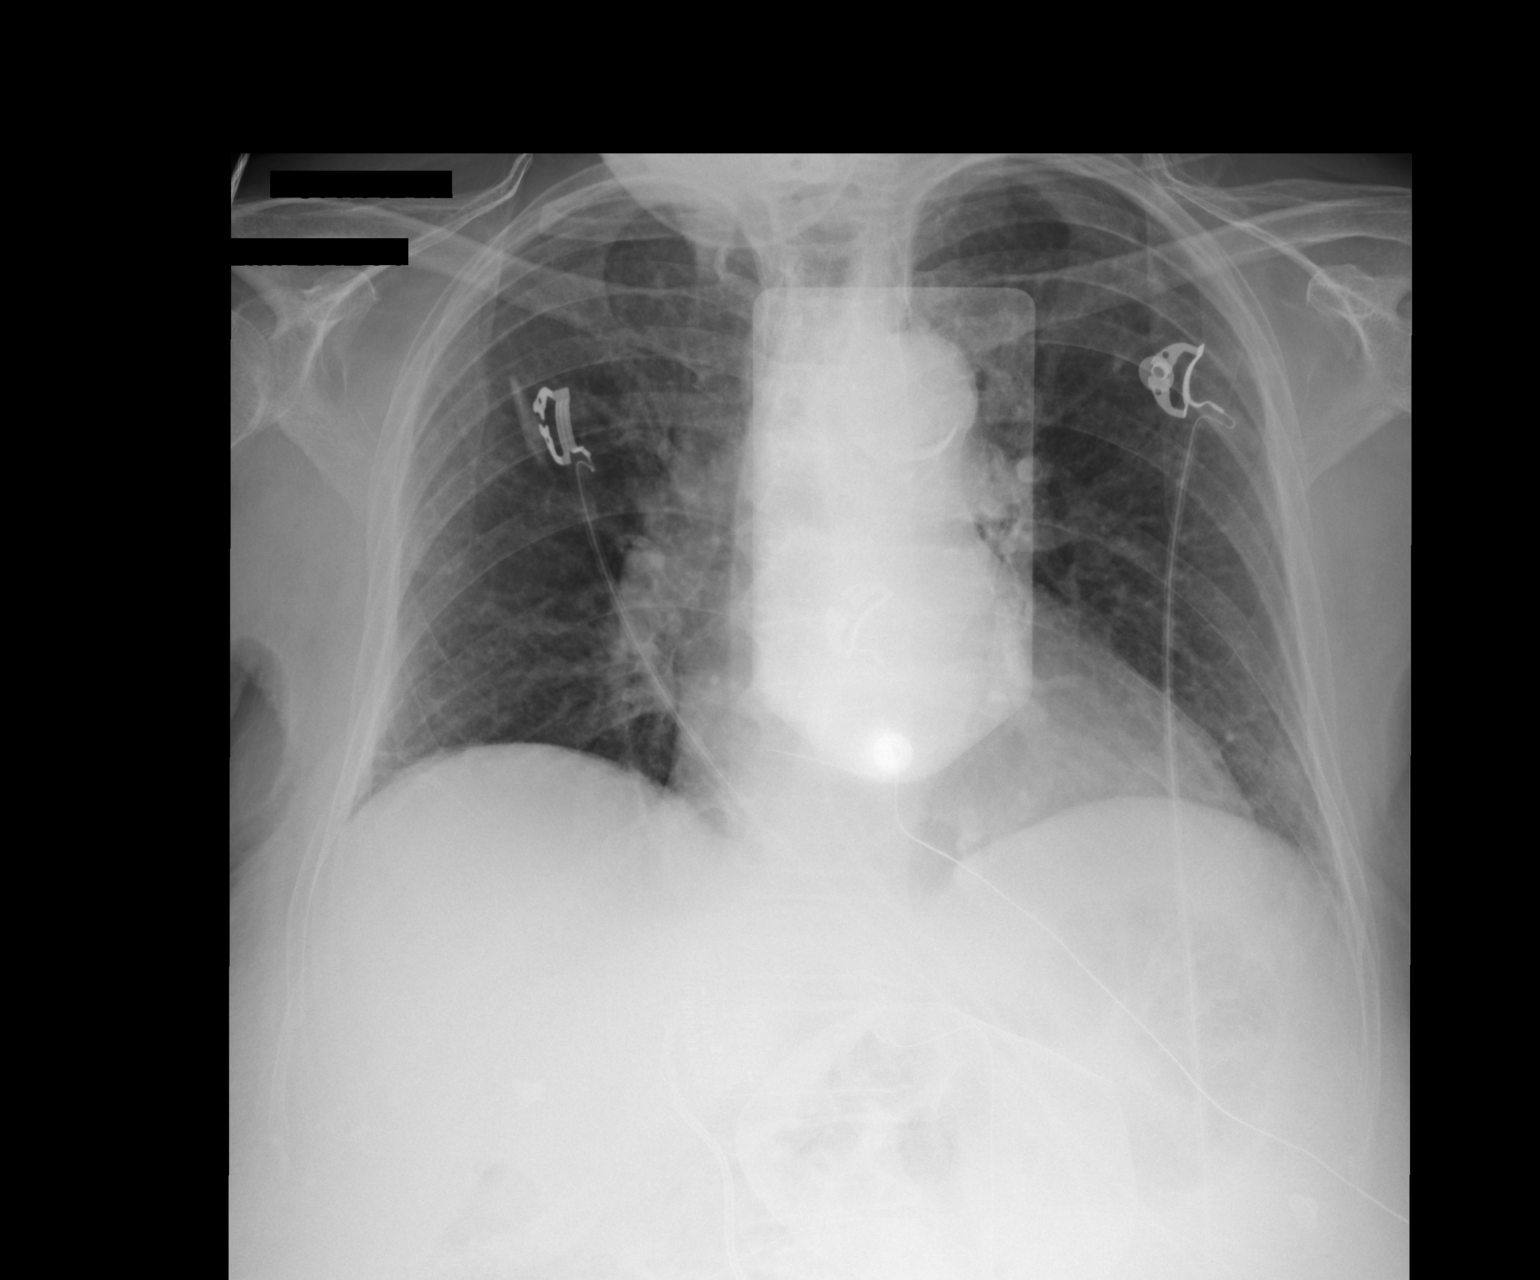

[1 of 1 positions shown; findings below may reference images not displayed]

FINDINGS: AP portable semi-erect view of the chest. Central chest obscured by
pacer patch. Mildly low lung volumes. Minimal atelectasis at the
right CP angle. No acute consolidation. Mild cardiomegaly.
Atherosclerosis of the aorta. No pneumothorax. Presumed clips in the
right upper quadrant.
IMPRESSION: 1. Low lung volumes with minimal atelectasis right CP angle.
2. Borderline to mild cardiomegaly with prominent central pulmonary
arteries.
3. Atherosclerotic vascular calcification of the aorta

## 2018-04-19 IMAGING — CR DG CHEST 2V
2 series · 2 of 2 positions shown · non-contrast
Comparison: Chest x-ray 10/01/2016.

CLINICAL DATA: 84-year-old female with history of nausea, vomiting
and diarrhea.

EXAM:
CHEST  2 VIEW

[chest lat]
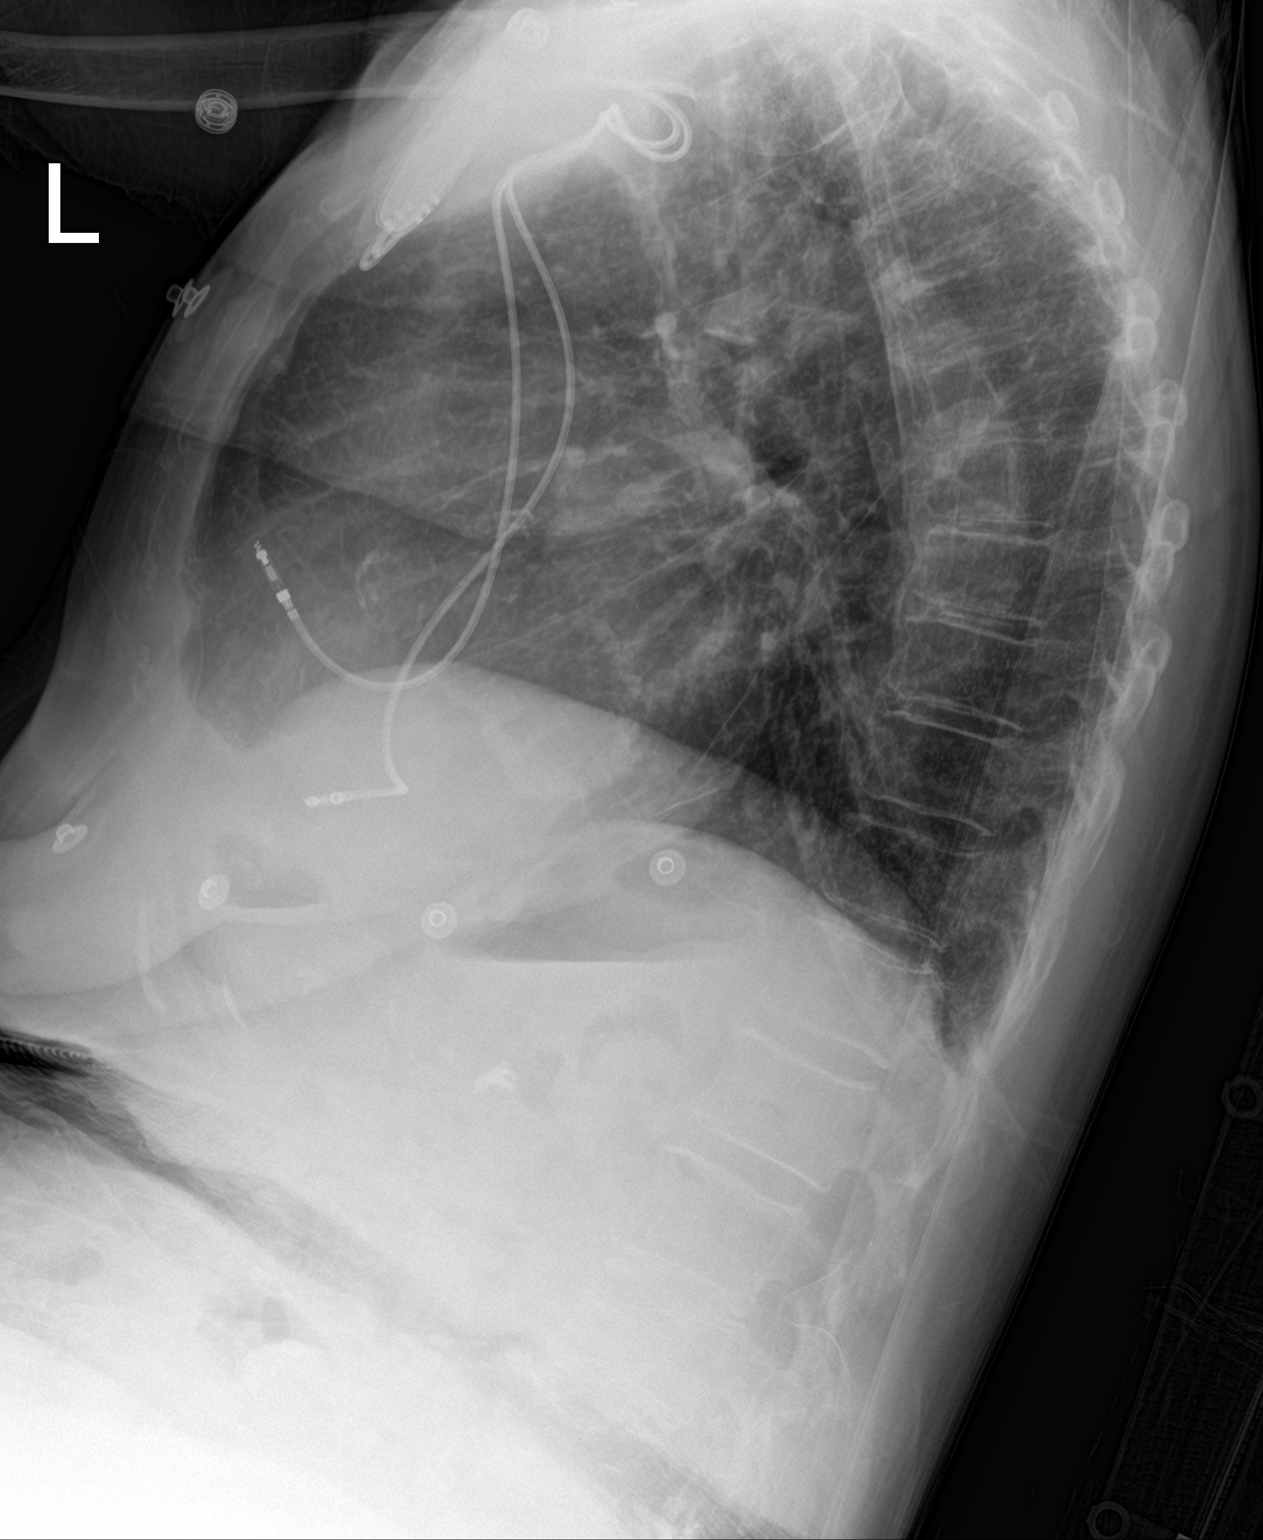

[chest ap]
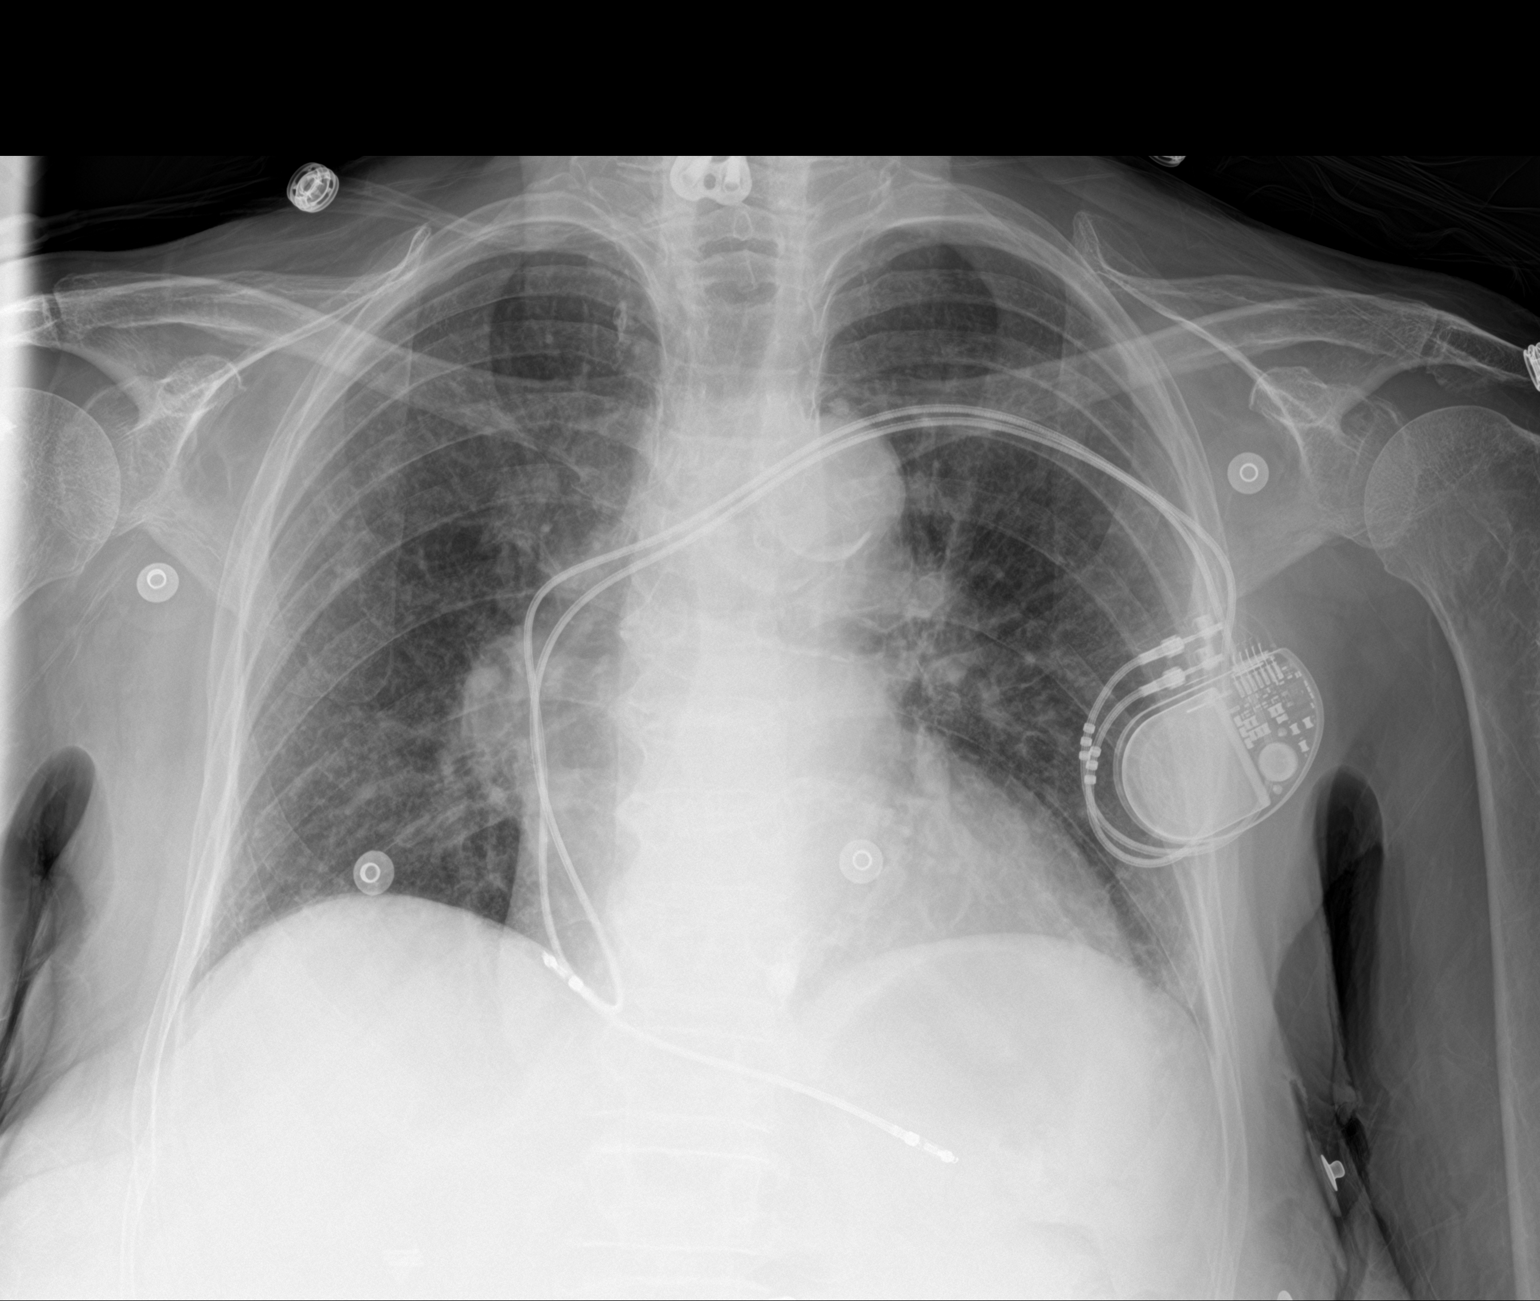

[2 of 2 positions shown; findings below may reference images not displayed]

FINDINGS: Lung volumes are low. No consolidative airspace disease. No pleural
effusions. No pneumothorax. No pulmonary nodule or mass noted.
Pulmonary vasculature and the cardiomediastinal silhouette are
within normal limits. Aortic atherosclerosis. Left-sided pacemaker
device in place with lead tips projecting over the expected location
of the right atrium and right ventricular apex. Orthopedic fixation
hardware in the lower cervical spine incidentally noted.
IMPRESSION: 1. Low lung volumes without radiographic evidence of acute
cardiopulmonary disease.
2. Aortic atherosclerosis.

## 2018-04-19 IMAGING — CT CT ABD-PELV W/ CM
2 of 5 series · 17 of 46 positions shown, 19 images · IV contrast (APPLIED)
Comparison: None.

CLINICAL DATA: Altered mental status vomiting

EXAM:
CT ABDOMEN AND PELVIS WITH CONTRAST
TECHNIQUE: Multidetector CT imaging of the abdomen and pelvis was performed
using the standard protocol following bolus administration of
intravenous contrast.
CONTRAST:  100mL [id number] IOPAMIDOL ([id number]) INJECTION 61%

[Series 2: abd/ pelvis 5.0 i30f 1 · axial · 0.79mm/px · z∈[+789,+1169]mm · 14 of 84 slices shown, 16 images]
[im 4/84  soft-tissue]
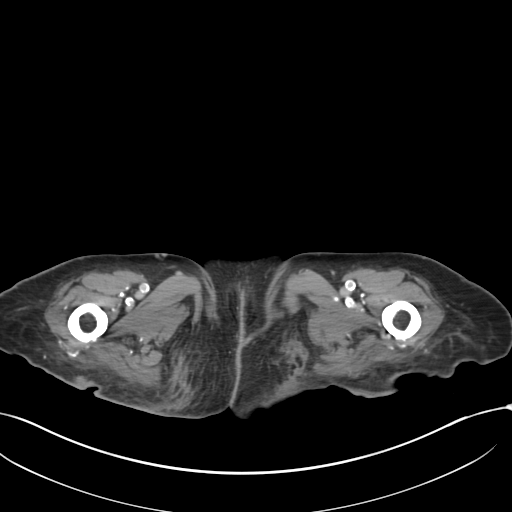
[im 4/84  bone]
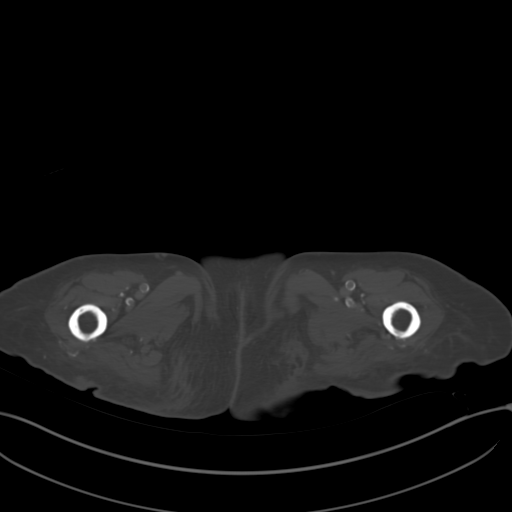
[im 12/84  soft-tissue]
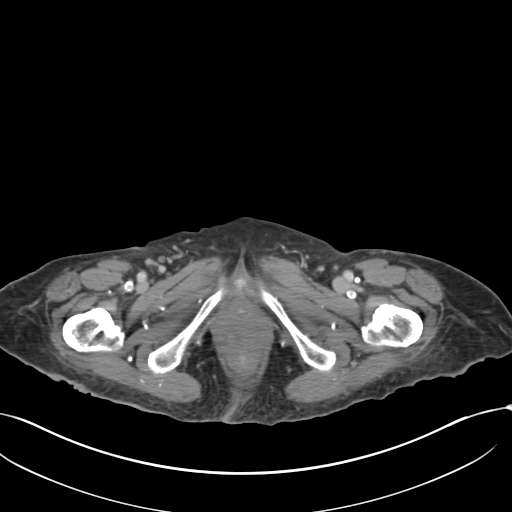
[im 16/84  soft-tissue]
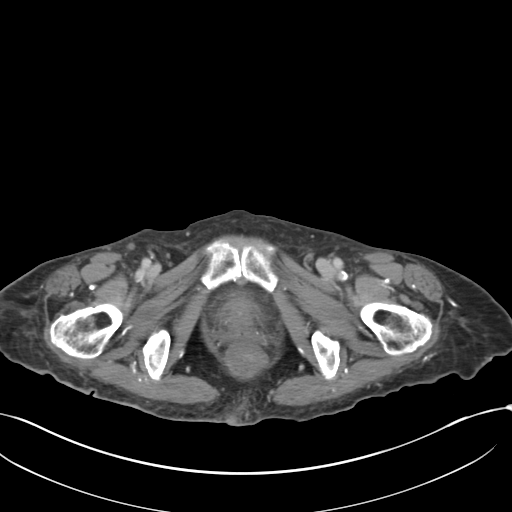
[im 24/84  soft-tissue]
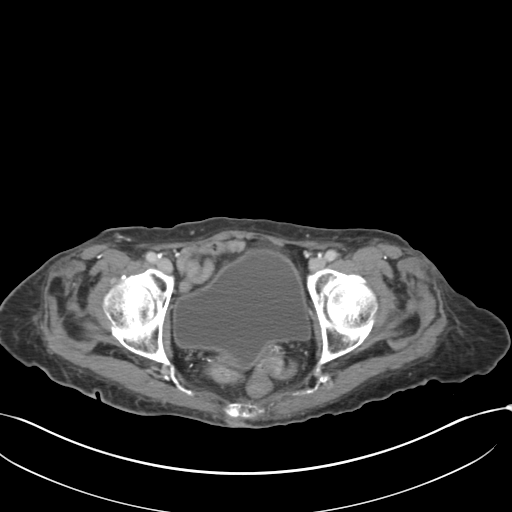
[im 28/84  soft-tissue]
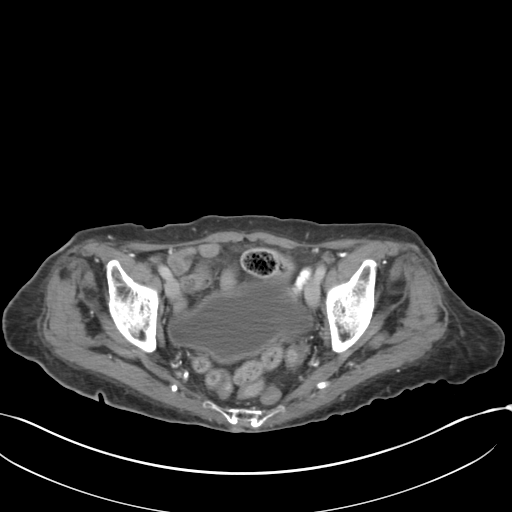
[im 32/84  soft-tissue]
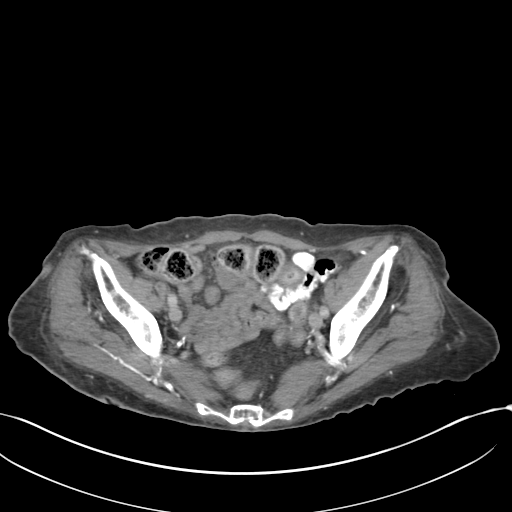
[im 40/84  soft-tissue]
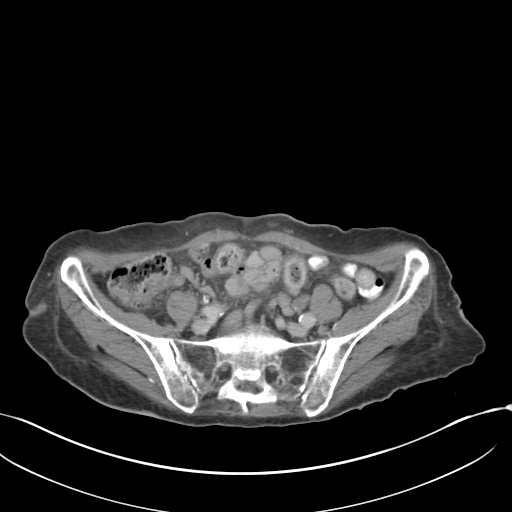
[im 44/84  soft-tissue]
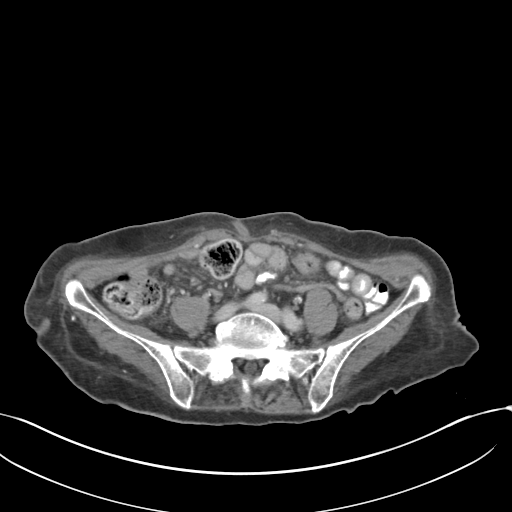
[im 52/84  soft-tissue]
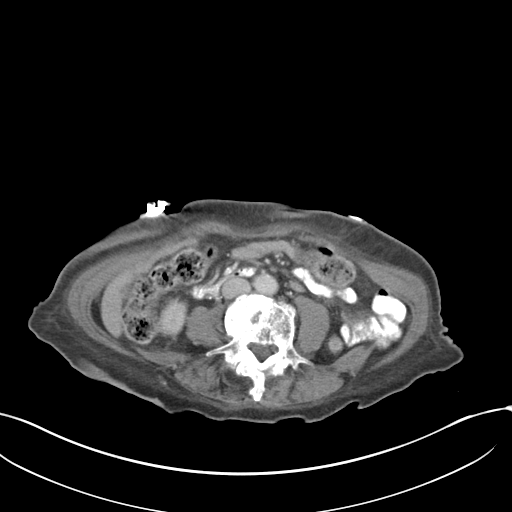
[im 52/84  bone]
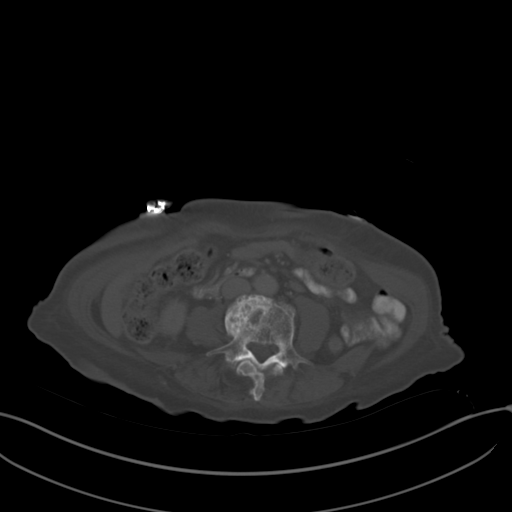
[im 56/84  soft-tissue]
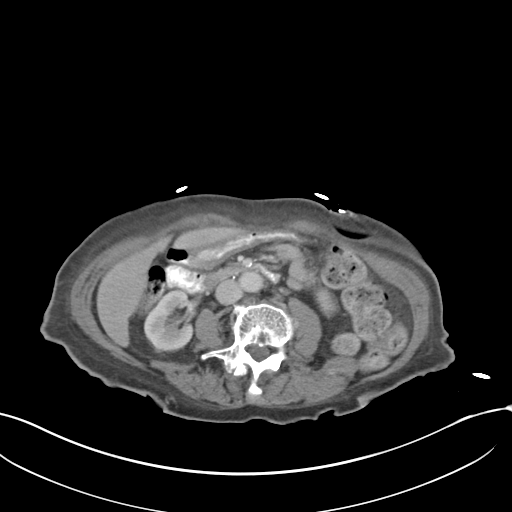
[im 64/84  soft-tissue]
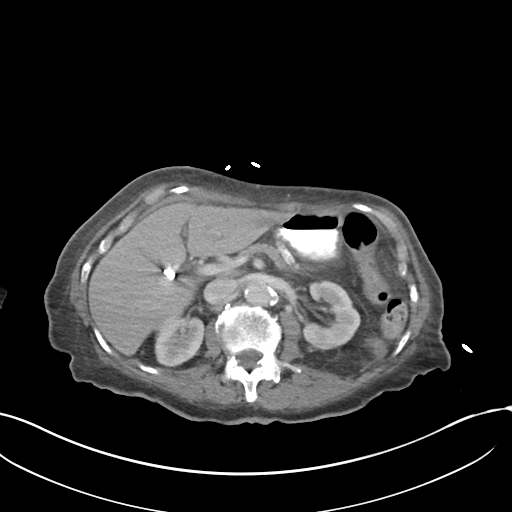
[im 68/84  soft-tissue]
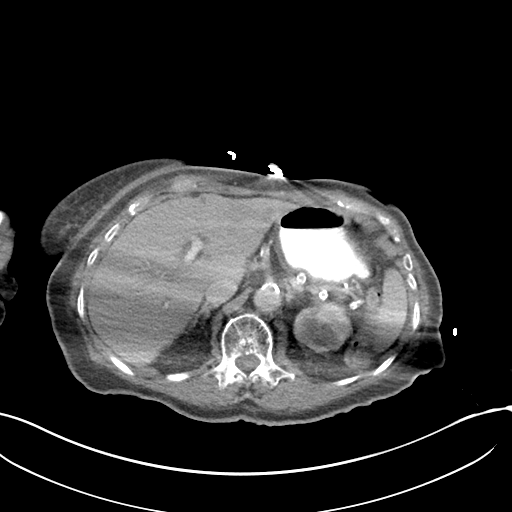
[im 72/84  soft-tissue]
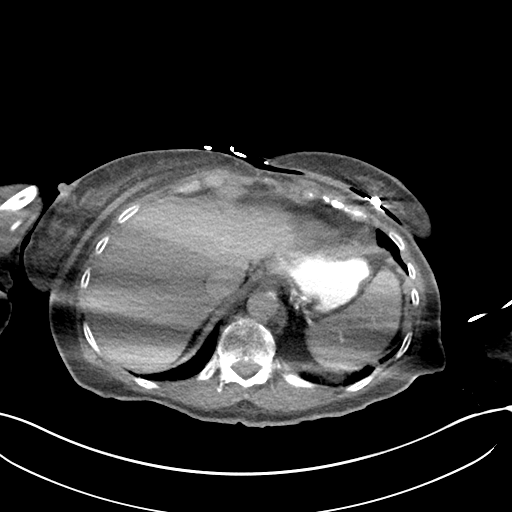
[im 80/84  soft-tissue]
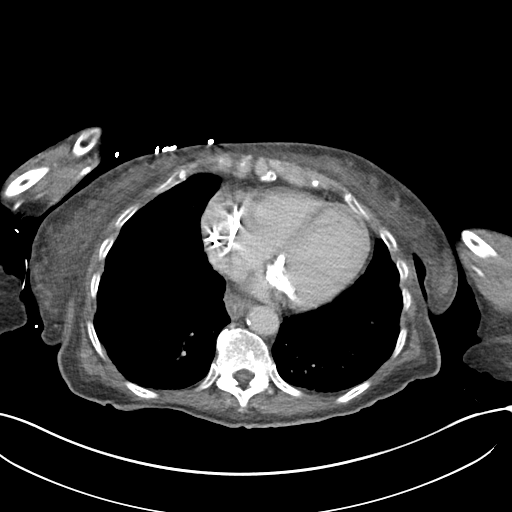

[Series 5: coronal soft tissue · coronal · 0.81mm/px · 3 of 73 slices shown]
[im 25/73  soft-tissue]
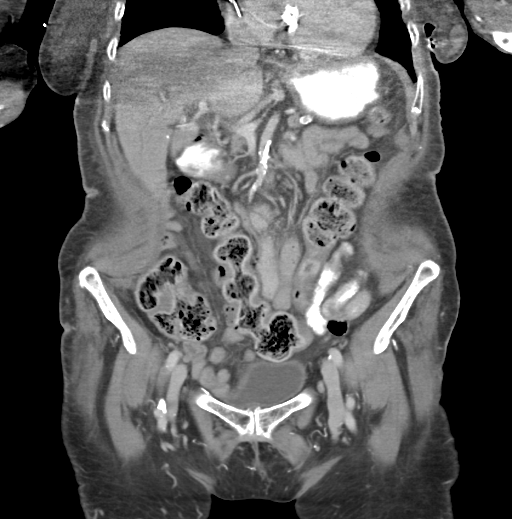
[im 33/73  soft-tissue]
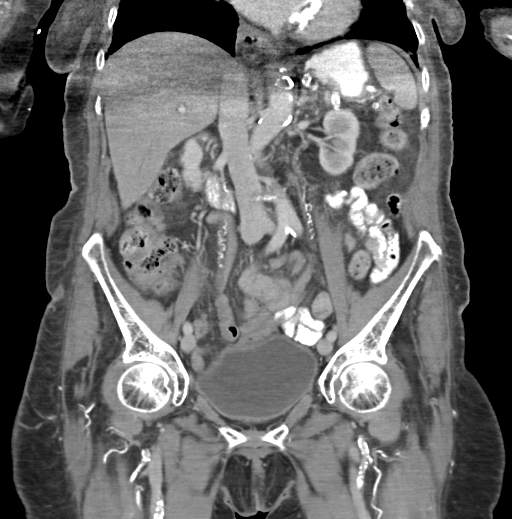
[im 41/73  soft-tissue]
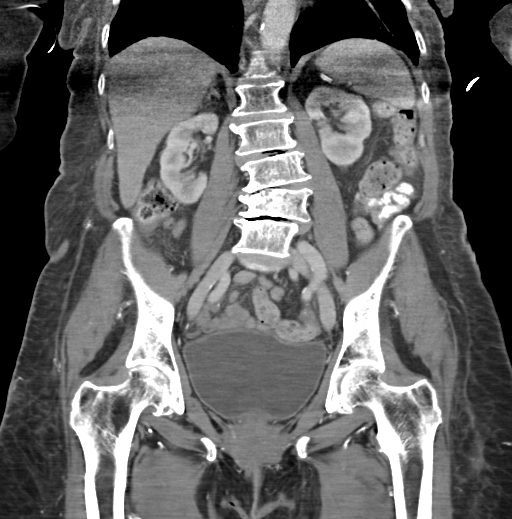

[17 of 46 positions shown; findings below may reference images not displayed]

FINDINGS: Lower chest: Lung bases demonstrate patchy dependent atelectasis at
the left base. No pleural effusion. Cardiomegaly is present.
Partially visualized pacer leads. Dense mitral calcifications.

Hepatobiliary: No focal hepatic abnormality. Surgical clips in the
gallbladder fossa. No biliary dilatation

Pancreas: Unremarkable. No pancreatic ductal dilatation or
surrounding inflammatory changes.

Spleen: Normal in size without focal abnormality.

Adrenals/Urinary Tract: Adrenal glands within normal limits. Kidneys
show no hydronephrosis. The bladder is enlarged but otherwise
unremarkable

Stomach/Bowel: The stomach is nonenlarged. There is no dilated small
bowel. No colon wall thickening. Appendix normal.

Vascular/Lymphatic: Moderate atherosclerotic vascular calcifications
of the aorta and branch vessels. No grossly enlarged lymph nodes

Reproductive: Status post hysterectomy. No adnexal masses.

Other: No free air or free fluid.  Diffuse subcutaneous edema.

Musculoskeletal: Degenerative changes of the spine with minimal
retrolisthesis of L3 on L4. Minimal anterolisthesis of L4 on L5.
Prominent Schmorl's node at L4 superiorly.
IMPRESSION: No CT evidence for a bowel obstruction or other acute
intra-abdominal or pelvic pathology
